# Patient Record
Sex: Male | Born: 1960 | Race: Black or African American | Hispanic: No | Marital: Married | Smoking: Former smoker
Health system: Southern US, Community
[De-identification: ages and names within clinical notes are randomized; demographics above are authoritative.]

## PROBLEM LIST (undated history)

## (undated) DIAGNOSIS — T7840XA Allergy, unspecified, initial encounter: Secondary | ICD-10-CM

## (undated) DIAGNOSIS — F1910 Other psychoactive substance abuse, uncomplicated: Secondary | ICD-10-CM

## (undated) DIAGNOSIS — M1990 Unspecified osteoarthritis, unspecified site: Secondary | ICD-10-CM

## (undated) DIAGNOSIS — D689 Coagulation defect, unspecified: Secondary | ICD-10-CM

## (undated) DIAGNOSIS — I10 Essential (primary) hypertension: Secondary | ICD-10-CM

## (undated) DIAGNOSIS — G709 Myoneural disorder, unspecified: Secondary | ICD-10-CM

## (undated) DIAGNOSIS — I639 Cerebral infarction, unspecified: Secondary | ICD-10-CM

## (undated) DIAGNOSIS — E785 Hyperlipidemia, unspecified: Secondary | ICD-10-CM

## (undated) HISTORY — DX: Other psychoactive substance abuse, uncomplicated: F19.10

## (undated) HISTORY — DX: Essential (primary) hypertension: I10

## (undated) HISTORY — DX: Coagulation defect, unspecified: D68.9

## (undated) HISTORY — DX: Unspecified osteoarthritis, unspecified site: M19.90

## (undated) HISTORY — DX: Myoneural disorder, unspecified: G70.9

## (undated) HISTORY — DX: Allergy, unspecified, initial encounter: T78.40XA

## (undated) HISTORY — DX: Hyperlipidemia, unspecified: E78.5

---

## 2002-03-26 ENCOUNTER — Emergency Department (HOSPITAL_COMMUNITY): Admission: EM | Admit: 2002-03-26 | Discharge: 2002-03-26 | Payer: Self-pay | Admitting: *Deleted

## 2002-03-26 ENCOUNTER — Encounter: Payer: Self-pay | Admitting: *Deleted

## 2002-07-26 ENCOUNTER — Emergency Department (HOSPITAL_COMMUNITY): Admission: EM | Admit: 2002-07-26 | Discharge: 2002-07-26 | Payer: Self-pay | Admitting: *Deleted

## 2002-07-26 ENCOUNTER — Encounter: Payer: Self-pay | Admitting: *Deleted

## 2006-01-31 ENCOUNTER — Emergency Department (HOSPITAL_COMMUNITY): Admission: EM | Admit: 2006-01-31 | Discharge: 2006-02-01 | Payer: Self-pay | Admitting: Emergency Medicine

## 2008-10-31 ENCOUNTER — Inpatient Hospital Stay (HOSPITAL_COMMUNITY): Admission: EM | Admit: 2008-10-31 | Discharge: 2008-11-05 | Payer: Self-pay | Admitting: Emergency Medicine

## 2008-11-02 ENCOUNTER — Ambulatory Visit: Payer: Self-pay | Admitting: Vascular Surgery

## 2008-11-02 ENCOUNTER — Encounter (INDEPENDENT_AMBULATORY_CARE_PROVIDER_SITE_OTHER): Payer: Self-pay | Admitting: *Deleted

## 2009-04-08 ENCOUNTER — Emergency Department (HOSPITAL_COMMUNITY): Admission: EM | Admit: 2009-04-08 | Discharge: 2009-04-08 | Payer: Self-pay | Admitting: Family Medicine

## 2010-05-04 ENCOUNTER — Emergency Department (HOSPITAL_COMMUNITY): Admission: EM | Admit: 2010-05-04 | Discharge: 2010-05-04 | Payer: BC Managed Care – PPO | Admitting: Emergency Medicine

## 2010-06-15 ENCOUNTER — Emergency Department (HOSPITAL_COMMUNITY)
Admission: EM | Admit: 2010-06-15 | Discharge: 2010-06-15 | Payer: BC Managed Care – PPO | Source: Home / Self Care | Admitting: Emergency Medicine

## 2010-06-17 ENCOUNTER — Emergency Department (HOSPITAL_COMMUNITY)
Admission: EM | Admit: 2010-06-17 | Discharge: 2010-06-17 | Payer: BC Managed Care – PPO | Source: Home / Self Care | Admitting: Family Medicine

## 2010-10-11 LAB — HEPATIC FUNCTION PANEL
Alkaline Phosphatase: 51 U/L (ref 39–117)
Bilirubin, Direct: 0.1 mg/dL (ref 0.0–0.3)
Indirect Bilirubin: 0.9 mg/dL (ref 0.3–0.9)
Total Bilirubin: 1 mg/dL (ref 0.3–1.2)

## 2010-10-11 LAB — COMPREHENSIVE METABOLIC PANEL
ALT: 16 U/L (ref 0–53)
AST: 31 U/L (ref 0–37)
Albumin: 3.4 g/dL — ABNORMAL LOW (ref 3.5–5.2)
Alkaline Phosphatase: 35 U/L — ABNORMAL LOW (ref 39–117)
Alkaline Phosphatase: 42 U/L (ref 39–117)
BUN: 8 mg/dL (ref 6–23)
CO2: 26 mEq/L (ref 19–32)
Calcium: 8.3 mg/dL — ABNORMAL LOW (ref 8.4–10.5)
Chloride: 106 mEq/L (ref 96–112)
Creatinine, Ser: 1.05 mg/dL (ref 0.4–1.5)
GFR calc Af Amer: >60 mL/min (ref >60)
GFR calc non Af Amer: >60 mL/min (ref >60)
Glucose, Bld: 99 mg/dL (ref 70–99)
Potassium: 4.1 mEq/L (ref 3.5–5.1)
Sodium: 140 mEq/L (ref 135–145)
Total Bilirubin: 1 mg/dL (ref 0.3–1.2)
Total Protein: 5.3 g/dL — ABNORMAL LOW (ref 6.0–8.3)

## 2010-10-11 LAB — DIFFERENTIAL
Basophils Absolute: 0 10*3/uL (ref 0.0–0.1)
Eosinophils Relative: 1 % (ref 0–5)
Lymphocytes Relative: 34 % (ref 12–46)
Lymphs Abs: 2.3 10*3/uL (ref 0.7–4.0)
Monocytes Absolute: 0.6 10*3/uL (ref 0.1–1.0)
Monocytes Relative: 8 % (ref 3–12)
Neutro Abs: 3.7 10*3/uL (ref 1.7–7.7)

## 2010-10-11 LAB — BASIC METABOLIC PANEL
BUN: 7 mg/dL (ref 6–23)
CO2: 24 mEq/L (ref 19–32)
CO2: 28 mEq/L (ref 19–32)
Calcium: 9 mg/dL (ref 8.4–10.5)
Calcium: 9.2 mg/dL (ref 8.4–10.5)
Chloride: 114 mEq/L — ABNORMAL HIGH (ref 96–112)
Creatinine, Ser: 1.01 mg/dL (ref 0.4–1.5)
Creatinine, Ser: 1.08 mg/dL (ref 0.4–1.5)
GFR calc Af Amer: >60 mL/min (ref >60)
GFR calc Af Amer: >60 mL/min (ref >60)
GFR calc non Af Amer: >60 mL/min (ref >60)
GFR calc non Af Amer: >60 mL/min (ref >60)
Glucose, Bld: 109 mg/dL — ABNORMAL HIGH (ref 70–99)
Glucose, Bld: 114 mg/dL — ABNORMAL HIGH (ref 70–99)
Glucose, Bld: 87 mg/dL (ref 70–99)
Potassium: 3.9 mEq/L (ref 3.5–5.1)
Sodium: 136 mEq/L (ref 135–145)
Sodium: 141 mEq/L (ref 135–145)

## 2010-10-11 LAB — URINALYSIS, ROUTINE W REFLEX MICROSCOPIC
Ketones, ur: NEGATIVE mg/dL
Protein, ur: NEGATIVE mg/dL
Urobilinogen, UA: 1 mg/dL (ref 0.0–1.0)

## 2010-10-11 LAB — POCT CARDIAC MARKERS
CKMB, poc: <1 ng/mL — ABNORMAL LOW (ref 1.0–8.0)
CKMB, poc: <1 ng/mL — ABNORMAL LOW (ref 1.0–8.0)
Myoglobin, poc: 53.3 ng/mL (ref 12–200)
Troponin i, poc: <0.05 ng/mL (ref 0.00–0.09)
Troponin i, poc: <0.05 ng/mL (ref 0.00–0.09)

## 2010-10-11 LAB — CBC
HCT: 45.8 % (ref 39.0–52.0)
HCT: 46.6 % (ref 39.0–52.0)
Hemoglobin: 14 g/dL (ref 13.0–17.0)
Hemoglobin: 14.1 g/dL (ref 13.0–17.0)
Hemoglobin: 16.4 g/dL (ref 13.0–17.0)
MCHC: 34.5 g/dL (ref 30.0–36.0)
MCHC: 34.8 g/dL (ref 30.0–36.0)
MCHC: 35 g/dL (ref 30.0–36.0)
MCV: 96.3 fL (ref 78.0–100.0)
MCV: 97.3 fL (ref 78.0–100.0)
Platelets: 164 10*3/uL (ref 150–400)
Platelets: 171 10*3/uL (ref 150–400)
RBC: 4.16 MIL/uL — ABNORMAL LOW (ref 4.22–5.81)
RBC: 4.75 MIL/uL (ref 4.22–5.81)
RBC: 4.88 MIL/uL (ref 4.22–5.81)
RDW: 12.7 % (ref 11.5–15.5)
RDW: 12.8 % (ref 11.5–15.5)
RDW: 13.2 % (ref 11.5–15.5)
RDW: 13.3 % (ref 11.5–15.5)
WBC: 6.2 10*3/uL (ref 4.0–10.5)

## 2010-10-11 LAB — LIPID PANEL
LDL Cholesterol: 116 mg/dL — ABNORMAL HIGH (ref 0–99)
Total CHOL/HDL Ratio: 6.5 RATIO
VLDL: 26 mg/dL (ref 0–40)

## 2010-10-11 LAB — RAPID URINE DRUG SCREEN, HOSP PERFORMED
Amphetamines: NOT DETECTED
Barbiturates: NOT DETECTED

## 2010-10-11 LAB — PROTIME-INR
INR: 1.1 (ref 0.00–1.49)
Prothrombin Time: 14.5 seconds (ref 11.6–15.2)

## 2010-10-11 LAB — TSH: TSH: 1.095 u[IU]/mL (ref 0.350–4.500)

## 2010-10-11 LAB — RPR: RPR Ser Ql: NONREACTIVE

## 2010-11-15 NOTE — Procedures (Signed)
EEG NUMBER:  [id number]   CLINICAL HISTORY:  A 50 year old admitted with syncope, who passed out  at a friend's house, losing consciousness for 1-2 minutes and awakened  without confusion, somewhat weak and headache with left-sided weakness.  (780.2)   PROCEDURE:  The tracing was carried out on a 32-channel digital Cadwell  recorder, reformatted into 16-channel montages with one devoted to EKG.  The patient was awake and alert during the recording and drowsy.  The  International 10/20 system lead placement was used.  Medications include  Lovenox, aspirin, NicoDerm, Zofran, and Tylenol.   DESCRIPTION OF FINDINGS:  Dominant frequency is a 15-20 microvolt, 10 Hz  alpha range activity.  Drowsiness is associated with mixed frequency  theta and upper delta range activity.  The patient did not drift in  natural sleep.  There was no focal slowing.  There was no interictal or  epileptiform activity in the form of spikes or sharp waves.  Hyperventilation failed to cause change in background.  Photic  stimulation induced a driving response from 1-61 Hz.   EKG showed a sinus bradycardia with ventricular response of 45 beats per  minute.   IMPRESSION:  In the waking state and drowsiness, this record is normal.      Deanna Artis. Sharene Skeans, M.D.  Electronically Signed     WRU:EAVW  D:  11/02/2008 22:58:49  T:  11/03/2008 09:81:19  Job #:  [id number]   cc:   Dr. Vena Austria

## 2010-11-15 NOTE — Discharge Summary (Signed)
NAME:  Samuel Shaw, Samuel Shaw NO.:  [id number]   MEDICAL RECORD NO.:  [id number]           PATIENT TYPE:   LOCATION:                                 FACILITY:   PHYSICIAN:  Elliot Cousin, M.D.    DATE OF BIRTH:  [birth year]   DATE OF ADMISSION:  DATE OF DISCHARGE:                               DISCHARGE SUMMARY   DISCHARGE DIAGNOSES:  1. Syncope, etiology unclear.  2. Sinus bradycardia.  Apparently, the patient's bradycardia is      lifelong by his history.  3. Nonobstructive bilateral internal carotid artery stenosis.  4. Tobacco abuse.  5. Marijuana use.   DISCHARGE MEDICATIONS:  1. Aspirin 81 mg daily.  2. Nicotine patch 21 mg daily, then as directed on the label.   DISCHARGE DISPOSITION:  The patient is being discharged to home in  improved and stable condition.  He was advised to follow up with his  primary care physician, Dr. Syliva Overman, in approximately 2 weeks  and with Dr. Corliss Skains in 1 year as advised.   CONSULTATION:  Sanjeev K. Deveshwar, MD   PROCEDURES PERFORMED:  1. Cerebral angiogram on Nov 03, 2008.  The results revealed mild to      moderate left internal carotid artery supraclinoid stenosis and      mild right internal carotid artery supraclinoid stenosis.  2. Carotid Doppler study on September 02, 2008.  The results revealed no      internal carotid artery stenosis.  3. EEG on Nov 02, 2008.  The results revealed in the waking state and      drowsiness, this record was normal.  4. A 2-D echocardiogram on Nov 02, 2008.  The results revealed left      ventricular size normal.  Wall thickness was normal.  Ejection      fraction estimated to be 50-55%.  Wall motion was normal.  There      was redundancy of the atrial septum.   HISTORY OF PRESENT ILLNESS:  The patient is a 50 year old man with no  significant past medical history, who presented to the emergency  department on Oct 31, 2008, after experiencing a syncopal episode.  According to the  history, the patient may have been unconscious for  approximately 1 minute.  When he came to, he was apparently weak but  somewhat oriented.  He admitted to occasional marijuana use but no other  illicit drug use.  When the patient was evaluated in the emergency  department, he was noted to be mildly bradycardic, otherwise,  hemodynamically stable.  He was afebrile.  A CT scan of the head was  ordered in the emergency department, and it revealed no acute  abnormalities.  His chest x-ray also revealed no acute abnormalities.  He was admitted for further evaluation and management.   For additional details, please see the dictated history and physical.   HOSPITAL COURSE:  SYNCOPE:  The patient was started on IV fluids  empirically.  For further evaluation, a number of studies were ordered  including an RPR, urine drug screen, D-dimer, TSH, and urinalysis.  Also  orthostatic vital signs were ordered.  The patient was not orthostatic  by blood pressure and heart rate.  His urinalysis was essentially  negative.  His urine drug screen was positive for THC, which was  consistent with his history.  His D-dimer was less than 0.22.  His TSH  was within normal limits at 1.095.  His RPR was nonreactive.  Cardiac  markers were also ordered and they were negative.  For ongoing  evaluation, an MRI of the brain and MRA of the brain were ordered, as  well as carotid Dopplers, 2-D echocardiogram, and an EEG.  The MRI of  his brain was essentially negative; however, there was some evidence of  mild maxillary sinusitis.  The MRA of his head revealed moderate  narrowing of both supraclinoid internal carotid arteries with dilatation  distal to both.  His carotid Doppler study, however was negative for ICA  stenosis.  His 2-D echocardiogram revealed preserved LV function and  with no gross valvular abnormalities.  His EEG was essentially negative.  Given the findings of the MRA of the head, interventional  radiologist,  Dr. Corliss Skains, was consulted.  Dr. Corliss Skains evaluated the patient and  proceeded with a cerebral angiogram.  The results are dictated above.  The patient was started on aspirin 81 mg daily.  Because of his tobacco  abuse, he was strongly advised to stop smoking.  A nicotine patch was  placed.   Additional lab studies were ordered including a fasting lipid profile  and a random cortisol level.  His fasting lipid profile revealed a total  cholesterol of 168, triglycerides of 129, HDL cholesterol of 26, and LDL  cholesterol of 116.  The random blood cortisol was within normal limits  at 5.7.  Over the past few days, the patient has been completely  asymptomatic.  He does have a bradycardia, which he explains has been  lifelong.  With ambulation, his heart rate increases to the lower 60s to  upper 80s; however, at rest and particularly during sleep, his heart  rate falls into the mid to upper 40s.  His bradycardia was felt to not  be a contributing factor to syncope, although it could not be completely  ruled out.  The exact etiology of his syncope was unclear.  The patient  did report that he had been working in the sun earlier that day and may  not have had enough to drink.  Therefore, the patient was advised to  avoid dehydration and marijuana use.  He was advised to follow up with  Dr. Corliss Skains per Dr. Fatima Sanger recommendation in 1 year.  He was also  strongly advised to follow up with his primary care physician, Dr.  Syliva Overman, in 2 weeks or earlier if needed.      Elliot Cousin, M.D.  Electronically Signed     DF/MEDQ  D:  11/05/2008  T:  11/06/2008  Job:  [id number]   cc:   Milus Mallick. Lodema Hong, M.D.  Sanjeev K. Corliss Skains, M.D.

## 2010-11-15 NOTE — H&P (Signed)
NAME:  Samuel Shaw, Samuel Shaw NO.:  [id number]   MEDICAL RECORD NO.:  [id number]          PATIENT TYPE:  EMS   LOCATION:  [facility]                         FACILITY:  [facility]   PHYSICIAN:  Vania Rea, M.D. DATE OF BIRTH:  14-Apr-1961   DATE OF ADMISSION:  10/31/2008  DATE OF DISCHARGE:                              HISTORY & PHYSICAL   PRIMARY CARE PHYSICIAN:  Unassigned.   CHIEF COMPLAINT:  Fainting spell.   HISTORY OF PRESENT ILLNESS:  This is a 50 year old African American  gentleman with no significant past medical history who while visiting a  friend today, got up from the couch to leave and passed out at the  friend's home.  By second-hand reports, the patient was unconscious for  maybe a minute or two and was very weak on being assisted up from the  ground.  However, he did not have any post-syncopal confusion.  The  patient was brought to the emergency room and his wife is now in  attendance.  She reports that he continues to be very weak, but he is  well oriented.   Wife also reports that the patient has been having episodic headaches  for about a week and has also been having weakness of the left side.  She has also been pointing out to him that the left side of his face is  slightly weak compared to the right side for some days now.  However,  she says the patient has been denying this.   The patient admits to occasional marijuana use and last used marijuana  about 1 month ago he says.  He denies alcohol use.  He does smoke 1 pack  of cigarettes per day.  He denies any chest pain, shortness of breath,  palpitations, nausea, vomiting or diarrhea.  He denies fever, chills.  Wife does report that he has not been getting a lot of sleep and has  been working very long hours for the past 7 days.   PAST MEDICAL HISTORY:  None.   MEDICATIONS:  None.   ALLERGIES:  NO KNOWN DRUG ALLERGIES.   SOCIAL HISTORY:  Lives with his wife and children.  Smokes 1 pack per  day.  Occasional alcohol use.  Marijuana use occasional, last used 1  month ago.  He works in [employer].   FAMILY HISTORY:  Denies any medical problems in his parents or siblings.  No history of cardiovascular disease.   PHYSICAL EXAMINATION:  GENERAL:  Somewhat lethargic middle-aged African  American gentleman reclining in the stretcher.  Needs a lot of  encouragement and reinforcement to open his eyes and cooperate with the  physical examination.  VITAL SIGNS:  Temperature 97.7, pulse 51, respirations 14, blood  pressure 117/71 lying, 125/79 standing with a pulse of 78.  There is no  dizziness with standing.  He is saturating 100% on room air.  HEENT:  His pupils are equal, round, and reactive.  Mucous membranes  pink.  He does appear to be mildly dehydrated.  NECK:  No cervical lymphadenopathy or thyromegaly.  No jugular venous  distention.  CHEST:  Clear to auscultation bilaterally.  CARDIOVASCULAR:  Regular rhythm.  No murmur heard.  He is appropriate  with a bradycardia for his age and profession.  ABDOMEN:  Scaphoid, soft, nontender.  There are no masses.  EXTREMITIES:  Without edema.  He has no bony joint deformity.  SKIN:  Without ulcer or blemish.  CENTRAL NERVOUS SYSTEM:  He does have mild left facial weakness which  although it is very difficult to see, but almost disappears when he is  forced to smile.  His wife insists that is not the normal contour of his  face.  The patient is right-handed, but he does have a mild right-sided  weakness when challenged against resistance.  His reflexes are equal and  brisk throughout.  He has no clonus.  There is no sensory deficit.  He  has no impairment of gag reflex or swallowing.   LABORATORY DATA:  CBC is reviewed and is completely normal.  White count  6.7, hemoglobin 16.4, platelets 189.  Serum chemistry is likewise  reviewed and is completely normal with potassium 3.9, BUN 12, creatinine  1.2.  Urinalysis:  Specific gravity  is 1.022, it is otherwise completely  normal.  His cardiac enzymes are completely normal with undetectable  troponins.  A repeat is also completely normal.  His urine drug screen  is positive for cannabis.  Negative for other constituents.   DIAGNOSIS:  1. A two-view chest x-ray shows no acute cardiopulmonary disease.  2. CT scan of his brain without contrast shows no evidence of brain or      skull.   ASSESSMENT:  1. Acute syncopal episode.  2. Tobacco abuse.  3. Marijuana abuse.  4. Orthostatic vitals.  5. Mild left facial weakness.   PLAN:  We will bring this gentleman on a short observation for  hydration.  We will get MRI of his brain to rule out an acute stroke or  other pathology.  Other plans as per orders.      Vania Rea, M.D.  Electronically Signed     LC/MEDQ  D:  10/31/2008  T:  10/31/2008  Job:  [id number]   cc:   Samuel Jester, DO

## 2013-01-13 ENCOUNTER — Encounter (HOSPITAL_COMMUNITY): Payer: Self-pay | Admitting: *Deleted

## 2013-01-13 ENCOUNTER — Emergency Department (HOSPITAL_COMMUNITY)
Admission: EM | Admit: 2013-01-13 | Discharge: 2013-01-13 | Disposition: A | Discharge status: Home / Self Care | Payer: BC Managed Care – PPO | Attending: Emergency Medicine | Admitting: Emergency Medicine

## 2013-01-13 DIAGNOSIS — K029 Dental caries, unspecified: Secondary | ICD-10-CM | POA: Insufficient documentation

## 2013-01-13 DIAGNOSIS — F17200 Nicotine dependence, unspecified, uncomplicated: Secondary | ICD-10-CM | POA: Insufficient documentation

## 2013-01-13 DIAGNOSIS — K0889 Other specified disorders of teeth and supporting structures: Secondary | ICD-10-CM

## 2013-01-13 DIAGNOSIS — K089 Disorder of teeth and supporting structures, unspecified: Secondary | ICD-10-CM | POA: Insufficient documentation

## 2013-01-13 MED ORDER — PENICILLIN V POTASSIUM 500 MG PO TABS: 500.0000 mg | ORAL_TABLET | Freq: Four times a day (QID) | ORAL | Status: DC

## 2013-01-13 MED ORDER — IBUPROFEN 800 MG PO TABS: 800.0000 mg | ORAL_TABLET | Freq: Three times a day (TID) | ORAL | Status: DC

## 2013-01-13 MED ORDER — HYDROCODONE-ACETAMINOPHEN 5-325 MG PO TABS
1.0000 | ORAL_TABLET | Freq: Once | ORAL | Status: AC
  Administered 2013-01-13: 1 via ORAL
  Filled 2013-01-13: qty 1

## 2013-01-13 NOTE — ED Notes (Signed)
Tooth pain x 2 days

## 2013-01-13 NOTE — ED Provider Notes (Signed)
History    CSN: [id number] Arrival date & time 01/13/13  1306  First MD Initiated Contact with Patient 01/13/13 1354     Chief Complaint  Patient presents with  . Dental Pain   (Consider location/radiation/quality/duration/timing/severity/associated sxs/prior Treatment) Patient is a 52 y.o. male presenting with tooth pain. The history is provided by the patient. No language interpreter was used.  Dental Pain Location:  Lower Lower teeth location:  29/RL 2nd bicuspid Quality:  Throbbing Severity:  Mild Onset quality:  Gradual Duration:  2 days Timing:  Constant Progression:  Unchanged Chronicity:  New Context: dental caries and poor dentition   Context: not abscess and not trauma   Relieved by:  None tried Worsened by:  Touching Ineffective treatments:  None tried Associated symptoms: no congestion, no difficulty swallowing, no drooling, no facial pain, no facial swelling, no fever, no gum swelling, no neck pain, no neck swelling, no oral bleeding, no oral lesions and no trismus   Risk factors: lack of dental care and smoking    History reviewed. No pertinent past medical history. History reviewed. No pertinent past surgical history. No family history on file. History  Substance Use Topics  . Smoking status: Current Every Day Smoker  . Smokeless tobacco: Not on file  . Alcohol Use: No    Review of Systems  Constitutional: Negative for fever.  HENT: Positive for dental problem (pain). Negative for congestion, facial swelling, rhinorrhea, drooling, mouth sores, trouble swallowing and neck pain.   Respiratory: Negative for shortness of breath.   All other systems reviewed and are negative.   Allergies  Review of patient's allergies indicates no known allergies.  Home Medications   Current Outpatient Rx  Name  Route  Sig  Dispense  Refill  . ibuprofen (ADVIL,MOTRIN) 800 MG tablet   Oral   Take 1 tablet (800 mg total) by mouth 3 (three) times daily.   21 tablet   0   . penicillin v potassium (VEETID) 500 MG tablet   Oral   Take 1 tablet (500 mg total) by mouth 4 (four) times daily.   40 tablet   0    BP 139/90  Pulse 50  Temp(Src) 97.9 F (36.6 C) (Oral)  Resp 20  SpO2 99% Physical Exam  Nursing note and vitals reviewed. Constitutional: He is oriented to person, place, and time. He appears well-developed and well-nourished. No distress.  HENT:  Head: Normocephalic and atraumatic. No trismus in the jaw.  Right Ear: Tympanic membrane, external ear and ear canal normal. No mastoid tenderness.  Left Ear: Tympanic membrane, external ear and ear canal normal. No mastoid tenderness.  Nose: Nose normal.  Mouth/Throat: Uvula is midline, oropharynx is clear and moist and mucous membranes are normal. Abnormal dentition. Dental caries present. No dental abscesses or edematous.    Airway patent. No area of fluctuance to suspect dental abscess. Patient tolerating secretions without difficulty.  Eyes: Conjunctivae and EOM are normal. Pupils are equal, round, and reactive to light. No scleral icterus.  Neck: Normal range of motion. Neck supple.  Cardiovascular: Normal rate, regular rhythm and normal heart sounds.   Pulmonary/Chest: Effort normal. No stridor. No respiratory distress. He has no wheezes.  Musculoskeletal: Normal range of motion.  Lymphadenopathy:    He has no cervical adenopathy.  Neurological: He is alert and oriented to person, place, and time.  Skin: Skin is warm and dry. No rash noted. He is not diaphoretic. No erythema. No pallor.  Psychiatric: He has a  normal mood and affect. His behavior is normal.   ED Course  Procedures (including critical care time) Labs Reviewed - No data to display No results found.  1. Pain, dental    MDM  Patient is a 52 year old male who presents for dental pain with onset yesterday. Physical exam as above. There is no trismus or stridor. Airway patent and patient tolerating secretions without  difficulty. No area of fluctuance to suspect drainable dental abscess. No red flags concerning for Ludwig's Angina. Patient appropriate for discharge with dental followup. Prescription for penicillin given to cover for infection and ibuprofen given for pain. Indications for ED return discussed with the patient verbalizes comfort and understanding with this discharge plan.  Antony Madura, PA-C 01/13/13 1426  Medical screening examination/treatment/procedure(s) were performed by non-physician practitioner and as supervising physician I was immediately available for consultation/collaboration.   Ashby Dawes, MD 01/14/13 1302

## 2013-01-13 NOTE — ED Notes (Signed)
Pt is her with right lower tooth pain

## 2013-07-03 DIAGNOSIS — I639 Cerebral infarction, unspecified: Secondary | ICD-10-CM

## 2013-07-03 HISTORY — DX: Cerebral infarction, unspecified: I63.9

## 2013-11-17 ENCOUNTER — Emergency Department (HOSPITAL_COMMUNITY)
Admission: EM | Admit: 2013-11-17 | Discharge: 2013-11-17 | Disposition: A | Discharge status: Home / Self Care | Payer: BC Managed Care – PPO | Attending: Emergency Medicine | Admitting: Emergency Medicine

## 2013-11-17 ENCOUNTER — Encounter (HOSPITAL_COMMUNITY): Payer: Self-pay | Admitting: Emergency Medicine

## 2013-11-17 DIAGNOSIS — Z791 Long term (current) use of non-steroidal anti-inflammatories (NSAID): Secondary | ICD-10-CM | POA: Insufficient documentation

## 2013-11-17 DIAGNOSIS — F17200 Nicotine dependence, unspecified, uncomplicated: Secondary | ICD-10-CM | POA: Insufficient documentation

## 2013-11-17 DIAGNOSIS — Z792 Long term (current) use of antibiotics: Secondary | ICD-10-CM | POA: Insufficient documentation

## 2013-11-17 DIAGNOSIS — R300 Dysuria: Secondary | ICD-10-CM | POA: Insufficient documentation

## 2013-11-17 DIAGNOSIS — B86 Scabies: Secondary | ICD-10-CM | POA: Insufficient documentation

## 2013-11-17 LAB — URINALYSIS, ROUTINE W REFLEX MICROSCOPIC
Bilirubin Urine: NEGATIVE
GLUCOSE, UA: NEGATIVE mg/dL
HGB URINE DIPSTICK: NEGATIVE
KETONES UR: NEGATIVE mg/dL
LEUKOCYTES UA: NEGATIVE
Nitrite: NEGATIVE
PH: 5 (ref 5.0–8.0)
Protein, ur: NEGATIVE mg/dL
Specific Gravity, Urine: 1.023 (ref 1.005–1.030)
Urobilinogen, UA: 0.2 mg/dL (ref 0.0–1.0)

## 2013-11-17 MED ORDER — PERMETHRIN 5 % EX CREA: TOPICAL_CREAM | CUTANEOUS | Status: DC

## 2013-11-17 NOTE — ED Provider Notes (Signed)
CSN: [id number]     Arrival date & time 11/17/13  1604 History   First MD Initiated Contact with Patient 11/17/13 1651     Chief Complaint  Patient presents with  . Illegal value: [  . scabies      (Consider location/radiation/quality/duration/timing/severity/associated sxs/prior Treatment) Patient is a 53 y.o. male presenting with rash. The history is provided by the patient. No language interpreter was used.  Rash Location:  Full body Quality: itchiness   Severity:  Moderate Onset quality:  Unable to specify Duration:  1 week Timing:  Constant Progression:  Worsening Chronicity:  New Context: insect bite/sting (suspected)   Relieved by:  Nothing Worsened by:  Nothing tried Ineffective treatments:  None tried Associated symptoms: no abdominal pain, no diarrhea, no fatigue, no fever, no headaches, no nausea, no shortness of breath and not vomiting     History reviewed. No pertinent past medical history. History reviewed. No pertinent past surgical history. History reviewed. No pertinent family history. History  Substance Use Topics  . Smoking status: Current Every Day Smoker  . Smokeless tobacco: Not on file  . Alcohol Use: No    Review of Systems  Constitutional: Negative for fever, activity change, appetite change and fatigue.  HENT: Negative for congestion, facial swelling, rhinorrhea and trouble swallowing.   Eyes: Negative for photophobia and pain.  Respiratory: Negative for cough, chest tightness and shortness of breath.   Cardiovascular: Negative for chest pain and leg swelling.  Gastrointestinal: Negative for nausea, vomiting, abdominal pain, diarrhea and constipation.  Endocrine: Negative for polydipsia and polyuria.  Genitourinary: Positive for dysuria. Negative for urgency, decreased urine volume and difficulty urinating.  Musculoskeletal: Negative for back pain and gait problem.  Skin: Positive for rash. Negative for color change and wound.   Allergic/Immunologic: Negative for immunocompromised state.  Neurological: Negative for dizziness, facial asymmetry, speech difficulty, weakness, numbness and headaches.  Psychiatric/Behavioral: Negative for confusion, decreased concentration and agitation.      Allergies  Review of patient's allergies indicates no known allergies.  Home Medications   Prior to Admission medications   Medication Sig Start Date End Date Taking? Authorizing Provider  ibuprofen (ADVIL,MOTRIN) 800 MG tablet Take 1 tablet (800 mg total) by mouth 3 (three) times daily. 01/13/13   Antonietta Breach, PA-C  penicillin v potassium (VEETID) 500 MG tablet Take 1 tablet (500 mg total) by mouth 4 (four) times daily. 01/13/13   Antonietta Breach, PA-C   BP 130/93  Pulse 68  Temp(Src) 98.5 F (36.9 C) (Oral)  Resp 16  SpO2 97% Physical Exam  Constitutional: He is oriented to person, place, and time. He appears well-developed and well-nourished. No distress.  HENT:  Head: Normocephalic and atraumatic.  Mouth/Throat: No oropharyngeal exudate.  Eyes: Pupils are equal, round, and reactive to light.  Neck: Normal range of motion. Neck supple.  Cardiovascular: Normal rate, regular rhythm and normal heart sounds.  Exam reveals no gallop and no friction rub.   No murmur heard. Pulmonary/Chest: Effort normal and breath sounds normal. No respiratory distress. He has no wheezes. He has no rales.  Abdominal: Soft. Bowel sounds are normal. He exhibits no distension and no mass. There is no tenderness. There is no rebound and no guarding.  Musculoskeletal: Normal range of motion. He exhibits no edema and no tenderness.  Neurological: He is alert and oriented to person, place, and time.  Skin: Skin is warm and dry.  Scattered excoriated papules on extremities, scantly on trunk, and also inguinal folds. Area  of former tick bite on penis nml.   Psychiatric: He has a normal mood and affect.    ED Course  Procedures (including critical  care time) Labs Review Labs Reviewed  GC/CHLAMYDIA PROBE AMP  URINALYSIS, ROUTINE W REFLEX MICROSCOPIC    Imaging Review No results found.   EKG Interpretation None      MDM   Final diagnoses:  Scabies    Pt is a 53 y.o. male with Pmhx as above who presents with about 1 week of a generalized pruritic rash, worse in groin. Wife with similar rash for about 1 week as well. Suspect scabies. Will treat w/ elimite cream. Pt also complains of burning urination. Will check UA, urine GC/Chlam.   UA nml. Will use condoms until GC/Ch returns.         Neta Ehlers, MD 11/17/13 [phone]

## 2013-11-17 NOTE — ED Notes (Signed)
Pt reports itching on lower legs and privates areas. Wife has been treated for scabies. Denies pain, just itching.

## 2013-11-17 NOTE — Discharge Instructions (Signed)
Scabies  Scabies are small bugs (mites) that burrow under the skin and cause red bumps and severe itching. These bugs can only be seen with a microscope. Scabies are highly contagious. They can spread easily from person to person by direct contact. They are also spread through sharing clothing or linens that have the scabies mites living in them. It is not unusual for an entire family to become infected through shared towels, clothing, or bedding.   HOME CARE INSTRUCTIONS   · Your caregiver may prescribe a cream or lotion to kill the mites. If cream is prescribed, massage the cream into the entire body from the neck to the bottom of both feet. Also massage the cream into the scalp and face if your child is less than 1 year old. Avoid the eyes and mouth. Do not wash your hands after application.  · Leave the cream on for 8 to 12 hours. Your child should bathe or shower after the 8 to 12 hour application period. Sometimes it is helpful to apply the cream to your child right before bedtime.  · One treatment is usually effective and will eliminate approximately 95% of infestations. For severe cases, your caregiver may decide to repeat the treatment in 1 week. Everyone in your household should be treated with one application of the cream.  · New rashes or burrows should not appear within 24 to 48 hours after successful treatment. However, the itching and rash may last for 2 to 4 weeks after successful treatment. Your caregiver may prescribe a medicine to help with the itching or to help the rash go away more quickly.  · Scabies can live on clothing or linens for up to 3 days. All of your child's recently used clothing, towels, stuffed toys, and bed linens should be washed in hot water and then dried in a dryer for at least 20 minutes on high heat. Items that cannot be washed should be enclosed in a plastic bag for at least 3 days.  · To help relieve itching, bathe your child in a cool bath or apply cool washcloths to the  affected areas.  · Your child may return to school after treatment with the prescribed cream.  SEEK MEDICAL CARE IF:   · The itching persists longer than 4 weeks after treatment.  · The rash spreads or becomes infected. Signs of infection include red blisters or yellow-tan crust.  Document Released: 06/19/2005 Document Revised: 09/11/2011 Document Reviewed: 10/28/2008  [employer]® Patient Information ©2014 [employer].

## 2014-07-06 ENCOUNTER — Inpatient Hospital Stay (HOSPITAL_COMMUNITY)
Admission: EM | Admit: 2014-07-06 | Discharge: 2014-07-08 | DRG: 065 | Payer: BLUE CROSS/BLUE SHIELD | Attending: Internal Medicine | Admitting: Internal Medicine

## 2014-07-06 ENCOUNTER — Emergency Department (HOSPITAL_COMMUNITY): Payer: BLUE CROSS/BLUE SHIELD

## 2014-07-06 ENCOUNTER — Encounter (HOSPITAL_COMMUNITY): Payer: Self-pay | Admitting: *Deleted

## 2014-07-06 ENCOUNTER — Inpatient Hospital Stay (HOSPITAL_COMMUNITY): Payer: BLUE CROSS/BLUE SHIELD

## 2014-07-06 DIAGNOSIS — I6789 Other cerebrovascular disease: Secondary | ICD-10-CM | POA: Diagnosis not present

## 2014-07-06 DIAGNOSIS — I63511 Cerebral infarction due to unspecified occlusion or stenosis of right middle cerebral artery: Principal | ICD-10-CM | POA: Diagnosis present

## 2014-07-06 DIAGNOSIS — Z7982 Long term (current) use of aspirin: Secondary | ICD-10-CM

## 2014-07-06 DIAGNOSIS — R001 Bradycardia, unspecified: Secondary | ICD-10-CM | POA: Diagnosis not present

## 2014-07-06 DIAGNOSIS — R531 Weakness: Secondary | ICD-10-CM | POA: Diagnosis present

## 2014-07-06 DIAGNOSIS — R270 Ataxia, unspecified: Secondary | ICD-10-CM | POA: Diagnosis present

## 2014-07-06 DIAGNOSIS — R222 Localized swelling, mass and lump, trunk: Secondary | ICD-10-CM

## 2014-07-06 DIAGNOSIS — F17210 Nicotine dependence, cigarettes, uncomplicated: Secondary | ICD-10-CM | POA: Diagnosis present

## 2014-07-06 DIAGNOSIS — G8194 Hemiplegia, unspecified affecting left nondominant side: Secondary | ICD-10-CM | POA: Diagnosis present

## 2014-07-06 DIAGNOSIS — R471 Dysarthria and anarthria: Secondary | ICD-10-CM | POA: Diagnosis present

## 2014-07-06 DIAGNOSIS — E785 Hyperlipidemia, unspecified: Secondary | ICD-10-CM | POA: Diagnosis not present

## 2014-07-06 DIAGNOSIS — Z720 Tobacco use: Secondary | ICD-10-CM

## 2014-07-06 DIAGNOSIS — I63231 Cerebral infarction due to unspecified occlusion or stenosis of right carotid arteries: Secondary | ICD-10-CM | POA: Diagnosis not present

## 2014-07-06 DIAGNOSIS — I6521 Occlusion and stenosis of right carotid artery: Secondary | ICD-10-CM | POA: Diagnosis present

## 2014-07-06 DIAGNOSIS — F419 Anxiety disorder, unspecified: Secondary | ICD-10-CM | POA: Diagnosis present

## 2014-07-06 DIAGNOSIS — I639 Cerebral infarction, unspecified: Secondary | ICD-10-CM | POA: Diagnosis present

## 2014-07-06 DIAGNOSIS — R1310 Dysphagia, unspecified: Secondary | ICD-10-CM

## 2014-07-06 HISTORY — DX: Cerebral infarction, unspecified: I63.9

## 2014-07-06 LAB — CBC
HCT: 45.7 % (ref 39.0–52.0)
Hemoglobin: 16.2 g/dL (ref 13.0–17.0)
MCH: 32 pg (ref 26.0–34.0)
MCHC: 35.4 g/dL (ref 30.0–36.0)
MCV: 90.1 fL (ref 78.0–100.0)
PLATELETS: 191 10*3/uL (ref 150–400)
RBC: 5.07 MIL/uL (ref 4.22–5.81)
RDW: 13.1 % (ref 11.5–15.5)
WBC: 5.7 10*3/uL (ref 4.0–10.5)

## 2014-07-06 LAB — COMPREHENSIVE METABOLIC PANEL
ALBUMIN: 4.2 g/dL (ref 3.5–5.2)
ALK PHOS: 53 U/L (ref 39–117)
ALT: 18 U/L (ref 0–53)
AST: 21 U/L (ref 0–37)
Anion gap: 8 (ref 5–15)
BILIRUBIN TOTAL: 2 mg/dL — AB (ref 0.3–1.2)
BUN: 8 mg/dL (ref 6–23)
CHLORIDE: 104 meq/L (ref 96–112)
CO2: 24 mmol/L (ref 19–32)
Calcium: 9.6 mg/dL (ref 8.4–10.5)
Creatinine, Ser: 1.09 mg/dL (ref 0.50–1.35)
GFR calc Af Amer: 88 mL/min — ABNORMAL LOW (ref >90)
GFR calc non Af Amer: 76 mL/min — ABNORMAL LOW (ref >90)
Glucose, Bld: 99 mg/dL (ref 70–99)
POTASSIUM: 4.4 mmol/L (ref 3.5–5.1)
SODIUM: 136 mmol/L (ref 135–145)
Total Protein: 7 g/dL (ref 6.0–8.3)

## 2014-07-06 LAB — DIFFERENTIAL
BASOS PCT: 1 % (ref 0–1)
Basophils Absolute: 0 10*3/uL (ref 0.0–0.1)
EOS ABS: 0.1 10*3/uL (ref 0.0–0.7)
EOS PCT: 1 % (ref 0–5)
LYMPHS ABS: 2.6 10*3/uL (ref 0.7–4.0)
Lymphocytes Relative: 44 % (ref 12–46)
Monocytes Absolute: 0.5 10*3/uL (ref 0.1–1.0)
Monocytes Relative: 9 % (ref 3–12)
NEUTROS PCT: 45 % (ref 43–77)
Neutro Abs: 2.5 10*3/uL (ref 1.7–7.7)

## 2014-07-06 LAB — I-STAT TROPONIN, ED: Troponin i, poc: 0 ng/mL (ref 0.00–0.08)

## 2014-07-06 LAB — PROTIME-INR
INR: 1.13 (ref 0.00–1.49)
PROTHROMBIN TIME: 14.6 s (ref 11.6–15.2)

## 2014-07-06 LAB — APTT: APTT: 29 s (ref 24–37)

## 2014-07-06 MED ORDER — NICOTINE 21 MG/24HR TD PT24
21.0000 mg | MEDICATED_PATCH | Freq: Every day | TRANSDERMAL | Status: DC
  Administered 2014-07-06 – 2014-07-08 (×3): 21 mg via TRANSDERMAL
  Filled 2014-07-06 (×3): qty 1

## 2014-07-06 MED ORDER — STROKE: EARLY STAGES OF RECOVERY BOOK
Freq: Once | Status: DC
  Filled 2014-07-06: qty 1

## 2014-07-06 MED ORDER — ACETAMINOPHEN 650 MG RE SUPP: 325.0000 mg | RECTAL | Status: DC | PRN

## 2014-07-06 MED ORDER — ASPIRIN 300 MG RE SUPP
300.0000 mg | Freq: Every day | RECTAL | Status: DC
  Administered 2014-07-06: 300 mg via RECTAL
  Filled 2014-07-06: qty 1

## 2014-07-06 MED ORDER — HEPARIN SODIUM (PORCINE) 5000 UNIT/ML IJ SOLN
5000.0000 [IU] | Freq: Three times a day (TID) | INTRAMUSCULAR | Status: DC
  Administered 2014-07-06 – 2014-07-08 (×6): 5000 [IU] via SUBCUTANEOUS
  Filled 2014-07-06 (×6): qty 1

## 2014-07-06 NOTE — Consult Note (Signed)
Stroke Consult    Chief Complaint: left sided weakness HPI: Samuel Shaw is an 54 y.o. male PMH of 2 TIAs 2 years ago, left carotid blockage, and known CVA in late December with recent discharge from hospital in [place] presenting with progressive left-sided weakness since that discharge. These symptoms are similar to his stroke in December, the family just feels they are getting worse. His wife also feels his speech is getting progressively harder to understand.  A review of records from  [place] are pertinent for a head CT showing an acute right basal ganglia infarct. Notes also mention a possible R ICA stenosis though no carotid ultrasound report or CTA/MRA of the neck is included. Discharged on ASA 325mg  daily.   Head CT imaging reviewed and no acute process, + signs of a subacute right MCA infarct.   Date last known well: 06/24/3014 Time last known well: 0800 tPA Given: no, outside window, recent CVA  Past Medical History  Diagnosis Date  . Stroke     History reviewed. No pertinent past surgical history.  History reviewed. No pertinent family history. Social History:  reports that he has been smoking.  He does not have any smokeless tobacco history on file. He reports that he does not drink alcohol or use illicit drugs.  Allergies: No Known Allergies   (Not in a hospital admission)  ROS: Out of a complete 14 system review, the patient complains of only the following symptoms, and all other reviewed systems are negative. +weakness, speech difficulty   Physical Examination: Filed Vitals:   07/06/14 1600  BP: 133/81  Pulse: 58  Temp:   Resp: 11   Physical Exam  Constitutional: He appears well-developed and well-nourished.  Psych: Affect appropriate to situation Eyes: No scleral injection HENT: No OP obstrucion Head: Normocephalic.  Cardiovascular: Normal rate and regular rhythm.  Respiratory: Effort normal and breath sounds normal.  GI: Soft. Bowel sounds are  normal. No distension. There is no tenderness.  Skin: WDI   Neurologic Examination: Mental Status: Alert, oriented, thought content appropriate.  Speech fluent without evidence of aphasia. Moderate dysarthria.  Cranial Nerves: II: unable to visualize fundi due to pupil size, visual fields grossly normal, pupils equal, round, reactive to light  III,IV, VI: ptosis not present, extra-ocular motions intact bilaterally V,VII: left facial weakness, facial light touch sensation normal bilaterally VIII: hearing normal bilaterally IX,X: gag reflex present XI: trapezius strength/neck flexion strength normal bilaterally XII: tongue strength normal  Motor: RUE and RLE 5/5 strength LUE and LLE drift with 4+-5-/5 strength Tone and bulk:normal tone throughout; no atrophy noted Sensory: Pinprick and light touch intact throughout, bilaterally Deep Tendon Reflexes: 2+ and symmetric throughout Plantars: Right: downgoing   Left: downgoing Cerebellar: normal finger-to-nose, HTS Gait: deferred due to multiple leads on in ED  Laboratory Studies:   Basic Metabolic Panel:  Recent Labs Lab 07/06/14 1258  NA 136  K 4.4  CL 104  CO2 24  GLUCOSE 99  BUN 8  CREATININE 1.09  CALCIUM 9.6    Liver Function Tests:  Recent Labs Lab 07/06/14 1258  AST 21  ALT 18  ALKPHOS 53  BILITOT 2.0*  PROT 7.0  ALBUMIN 4.2   No results for input(s): LIPASE, AMYLASE in the last 168 hours. No results for input(s): AMMONIA in the last 168 hours.  CBC:  Recent Labs Lab 07/06/14 1258  WBC 5.7  NEUTROABS 2.5  HGB 16.2  HCT 45.7  MCV 90.1  PLT 191  Cardiac Enzymes: No results for input(s): CKTOTAL, CKMB, CKMBINDEX, TROPONINI in the last 168 hours.  BNP: Invalid input(s): POCBNP  CBG: No results for input(s): GLUCAP in the last 168 hours.  Microbiology: No results found for this or any previous visit.  Coagulation Studies:  Recent Labs  07/06/14 1420  LABPROT 14.6  INR 1.13     Urinalysis: No results for input(s): COLORURINE, LABSPEC, PHURINE, GLUCOSEU, HGBUR, BILIRUBINUR, KETONESUR, PROTEINUR, UROBILINOGEN, NITRITE, LEUKOCYTESUR in the last 168 hours.  Invalid input(s): APPERANCEUR  Lipid Panel:     Component Value Date/Time   CHOL  11/05/2008 0440    168        ATP III CLASSIFICATION:  <200     mg/dL   Desirable  200-239  mg/dL   Borderline High  >=240    mg/dL   High          TRIG 129 11/05/2008 0440   HDL 26* 11/05/2008 0440   CHOLHDL 6.5 11/05/2008 0440   VLDL 26 11/05/2008 0440   LDLCALC * 11/05/2008 0440    116        Total Cholesterol/HDL:CHD Risk Coronary Heart Disease Risk Table                     Men   Women  1/2 Average Risk   3.4   3.3  Average Risk       5.0   4.4  2 X Average Risk   9.6   7.1  3 X Average Risk  23.4   11.0        Use the calculated Patient Ratio above and the CHD Risk Table to determine the patient's CHD Risk.        ATP III CLASSIFICATION (LDL):  <100     mg/dL   Optimal  100-129  mg/dL   Near or Above                    Optimal  130-159  mg/dL   Borderline  160-189  mg/dL   High  >190     mg/dL   Very High    HgbA1C: No results found for: HGBA1C  Urine Drug Screen:     Component Value Date/Time   LABOPIA NONE DETECTED 10/31/2008 0009   COCAINSCRNUR NONE DETECTED 10/31/2008 0009   LABBENZ NONE DETECTED 10/31/2008 0009   AMPHETMU NONE DETECTED 10/31/2008 0009   THCU POSITIVE* 10/31/2008 0009   LABBARB  10/31/2008 0009    NONE DETECTED        DRUG SCREEN FOR MEDICAL PURPOSES ONLY.  IF CONFIRMATION IS NEEDED FOR ANY PURPOSE, NOTIFY LAB WITHIN 5 DAYS.        LOWEST DETECTABLE LIMITS FOR URINE DRUG SCREEN Drug Class       Cutoff (ng/mL) Amphetamine      1000 Barbiturate      200 Benzodiazepine   735 Tricyclics       329 Opiates          300 Cocaine          300 THC              50    Alcohol Level: No results for input(s): ETH in the last 168 hours.  Other results: EKG: sinus  bradycardia.  Imaging: Dg Chest 2 View  07/06/2014   CLINICAL DATA:  Increasing left-sided weakness. Previous stroke. Smoker.  EXAM: CHEST  2 VIEW  COMPARISON:  10/31/2008.  FINDINGS: Normal sized  heart. Clear lungs. Increased density overlying the posterior aspect of the lower thoracic spine.  IMPRESSION: Possible mass overlying the posterior aspect of the lower thoracic spine. A chest CT with contrast is recommended.   Electronically Signed   By: Enrique Sack M.D.   On: 07/06/2014 15:54   Ct Head Wo Contrast  07/06/2014   CLINICAL DATA:  Worsened left side weakness. The patient was recently diagnosed with stroke at an outside facility.  EXAM: CT HEAD WITHOUT CONTRAST  TECHNIQUE: Contiguous axial images were obtained from the base of the skull through the vertex without intravenous contrast.  COMPARISON:  Head CT scan and brain MRI 10/31/2008.  FINDINGS: Hypoattenuation in the anterior right MCA territory correlates with recent diagnosis of infarct. Scattered areas of hypoattenuation are also seen in the deep white matter with lacunar infarction which appears remote in the right internal capsule identified. No evidence of acute infarction, hemorrhage, mass lesion, mass effect, midline shift or abnormal extra-axial fluid collection is identified. There is no hydrocephalus or pneumocephalus. The calvarium is intact.  IMPRESSION: No acute finding.  Findings consistent with subacute to remote right MCA territory infarct. Chronic microvascular ischemic change also noted.   Electronically Signed   By: Inge Rise M.D.   On: 07/06/2014 14:57    Assessment: 54 y.o. male with history of prior TIA, left carotid stenosis and recent  CVA presenting with progressive worsening of his left sided symptoms since discharge. Unclear if this represents an extension of recent CVA or recrudescence of prior symptoms. Will admit for further workup.   Stroke Risk Factors -CVA, carotid stenosis, HLD, tobacco use  As records  from outside hospital do not show a 2D echo or carotid doppler would complete these during this admission.  Plan: 1. HgbA1c, fasting lipid panel 2. MRI, MRA  of the brain without contrast 3. PT consult, OT consult, Speech consult 4. Echocardiogram 5. Carotid dopplers 6. Prophylactic therapy-continue ASA 325mg  daily 7. Risk factor modification 8. Telemetry monitoring 9. Frequent neuro checks 10. NPO until RN stroke swallow screen     Jim Like, DO [facility] [phone]  If 7pm- 7am, please page neurology on call as listed in [facility]. 07/06/2014, 4:56 PM

## 2014-07-06 NOTE — Progress Notes (Signed)
Patient went off the floor to Radiology for MRI at 2020 and returned at 2100.

## 2014-07-06 NOTE — ED Notes (Signed)
Per family member, pt was recently treated for cva in [place]. Family states pt having increase in left side weakness and difficulty caring for him at home.

## 2014-07-06 NOTE — ED Provider Notes (Signed)
CSN: [id number]     Arrival date & time 07/06/14  1241 History   First MD Initiated Contact with Patient 07/06/14 1310     Chief Complaint  Patient presents with  . Weakness     (Consider location/radiation/quality/duration/timing/severity/associated sxs/prior Treatment) HPI  Samuel Shaw is a 54 y.o. male with PMH of 2 TIAs 2 years ago without residual effects, known CVA in December with recent discharge from hospital in [place] presenting with progressive left-sided weakness since November 26. Family member in the room and answers most questions. Family member states that he is not mentating at his baseline. He is slower takes 1. She also notes when he is eating sometimes comes out the side of his mouth. He also has difficulty ambulating. She denies any sudden decreases in mentation. No sudden increase in weakness but it is progressive.. Patient has had intermittent headaches that his wife describes as "migraines" he denies any pain now. He denies any blurred vision. His wife denies any slurred speech. He denies any back pain, nausea, vomiting, abdominal pain. After his 2 TIAs he did not have any follow-up and no PCP. He is not taking any medications other than vitamins. He is a current smoker. Denies history of hypertension, diabetes, dyslipidemia.   Past Medical History  Diagnosis Date  . Stroke    History reviewed. No pertinent past surgical history. History reviewed. No pertinent family history. History  Substance Use Topics  . Smoking status: Current Every Day Smoker  . Smokeless tobacco: Not on file  . Alcohol Use: No    Review of Systems  Constitutional: Negative for fever and chills.  HENT: Negative for congestion and rhinorrhea.   Eyes: Negative for visual disturbance.  Respiratory: Negative for cough and shortness of breath.   Cardiovascular: Negative for chest pain and palpitations.  Gastrointestinal: Negative for nausea, vomiting and diarrhea.  Genitourinary:  Negative for dysuria and hematuria.  Musculoskeletal: Negative for back pain and gait problem.  Skin: Negative for rash.  Neurological: Positive for weakness and headaches.      Allergies  Review of patient's allergies indicates no known allergies.  Home Medications   Prior to Admission medications   Medication Sig Start Date End Date Taking? Authorizing Provider  aspirin 325 MG tablet Take 325 mg by mouth daily.   Yes Historical Provider, MD  folic acid (FOLVITE) 614 MCG tablet Take 400 mcg by mouth daily.   Yes Historical Provider, MD  nicotine (NICODERM CQ - DOSED IN MG/24 HOURS) 21 mg/24hr patch Place 21 mg onto the skin daily.   Yes Historical Provider, MD  Thiamine HCl (VITAMIN B-1) 250 MG tablet Take 250 mg by mouth daily.   Yes Historical Provider, MD  permethrin (ELIMITE) 5 % cream Apply from neck down at night after shower. Wash off in the morning. May repeat in 1 week if still symptomatic. Patient not taking: Reported on 07/06/2014 11/17/13   Ernestina Patches, MD   BP 133/81 mmHg  Pulse 58  Temp(Src) 98.1 F (36.7 C) (Oral)  Resp 11  SpO2 99% Physical Exam  Constitutional: He appears well-developed and well-nourished. No distress.  HENT:  Head: Normocephalic and atraumatic.  Mouth/Throat: Oropharynx is clear and moist.  Eyes: Conjunctivae and EOM are normal. Pupils are equal, round, and reactive to light. Right eye exhibits no discharge. Left eye exhibits no discharge.  Neck: Normal range of motion. Neck supple.  No nuchal rigidity  Cardiovascular: Normal rate and regular rhythm.   Pulmonary/Chest: Effort normal and  breath sounds normal. No respiratory distress. He has no wheezes.  Abdominal: Soft. Bowel sounds are normal. He exhibits no distension. There is no tenderness.  Neurological: He is alert. No cranial nerve deficit. Coordination normal.  Speech is clear and goal oriented. Peripheral visual fields intact. Left facial droop. Strength 4/5 in upper and 5/5 lower  extremities. Sensation intact. Pt able to keep arms extended and against resistance but he pronates his left hand. Gait not assessed to to instability.  Skin: Skin is warm and dry. He is not diaphoretic.  Nursing note and vitals reviewed.   ED Course  Procedures (including critical care time) Labs Review Labs Reviewed  COMPREHENSIVE METABOLIC PANEL - Abnormal; Notable for the following:    Total Bilirubin 2.0 (*)    GFR calc non Af Amer 76 (*)    GFR calc Af Amer 88 (*)    All other components within normal limits  CBC  DIFFERENTIAL  APTT  PROTIME-INR  URINALYSIS, ROUTINE W REFLEX MICROSCOPIC  I-STAT TROPOININ, ED    Imaging Review Dg Chest 2 View  07/06/2014   CLINICAL DATA:  Increasing left-sided weakness. Previous stroke. Smoker.  EXAM: CHEST  2 VIEW  COMPARISON:  10/31/2008.  FINDINGS: Normal sized heart. Clear lungs. Increased density overlying the posterior aspect of the lower thoracic spine.  IMPRESSION: Possible mass overlying the posterior aspect of the lower thoracic spine. A chest CT with contrast is recommended.   Electronically Signed   By: Enrique Sack M.D.   On: 07/06/2014 15:54   Ct Head Wo Contrast  07/06/2014   CLINICAL DATA:  Worsened left side weakness. The patient was recently diagnosed with stroke at an outside facility.  EXAM: CT HEAD WITHOUT CONTRAST  TECHNIQUE: Contiguous axial images were obtained from the base of the skull through the vertex without intravenous contrast.  COMPARISON:  Head CT scan and brain MRI 10/31/2008.  FINDINGS: Hypoattenuation in the anterior right MCA territory correlates with recent diagnosis of infarct. Scattered areas of hypoattenuation are also seen in the deep white matter with lacunar infarction which appears remote in the right internal capsule identified. No evidence of acute infarction, hemorrhage, mass lesion, mass effect, midline shift or abnormal extra-axial fluid collection is identified. There is no hydrocephalus or  pneumocephalus. The calvarium is intact.  IMPRESSION: No acute finding.  Findings consistent with subacute to remote right MCA territory infarct. Chronic microvascular ischemic change also noted.   Electronically Signed   By: Inge Rise M.D.   On: 07/06/2014 14:57     EKG Interpretation   Date/Time:  Monday July 06 2014 12:57:15 EST Ventricular Rate:  48 PR Interval:  144 QRS Duration: 84 QT Interval:  420 QTC Calculation: 375 R Axis:   71 Text Interpretation:  Marked sinus bradycardia Minimal voltage criteria  for LVH, may be normal variant Abnormal ECG No significant change since  last tracing Confirmed by Mingo Amber  MD, [place] ([id number]) on 07/06/2014 1:40:32 PM      MDM   Final diagnoses:  Weakness  CVA (cerebral vascular accident)  Dysphagia  Chest mass  Tobacco abuse   Patient presenting after CVA end of December and Hospital in [place] for persistent with worsening left-sided weakness, dysphasia, ataxia. Family member also endorses some decrease in mentation in that he has difficulty focusing and is forgetful. Repeat CT today with findings consistent of subacute right MCA infarct without any findings. Lab work unremarkable and EKG without significant changes. Patient also found to have possible mass on  chest x-ray. He is a chronic smoker. Consult to hospitalist and spoke with Dr. [facility] who agreed to evaluate and for admission. Also consult to neurology who will evaluate him as well.  Discussed all results and patient verbalizes understanding and agrees with plan.  This is a shared patient. This patient was discussed with the physician, Dr. Mingo Amber who saw and evaluated the patient and agrees with the plan.     Pura Spice, PA-C 07/06/14 [place], MD 07/07/14 2330

## 2014-07-06 NOTE — ED Notes (Signed)
Pt to xray

## 2014-07-06 NOTE — H&P (Signed)
Patient Demographics  Samuel Shaw, is a 54 y.o. male  MRN: [id number]   DOB - 1961-04-04  Admit Date - 07/06/2014  Outpatient Primary MD for the patient is Tula Nakayama, MD   With History of -  Past Medical History  Diagnosis Date  . Stroke       History reviewed. No pertinent past surgical history.  in for   Chief Complaint  Patient presents with  . Weakness     HPI  Samuel Shaw  is a 54 y.o. male, with recent history of acute ischemic CVA, was visiting family members in [place], with residual left-sided weakness, mild dysphagia, patient came back from [place] yesterday evening, wife reports she noticed worsening left-sided weakness, worsening dysphagia, and some confusion, patient reports a he didn't have significant changes at baseline, did fail swallow evaluation though in ED, patient reports he took his aspirin in a.m., CT head done in ED consistent with subacute to remote right MCA territory infarct with chronic microvascular ischemic changes.    Review of Systems    In addition to the HPI above,  No Fever-chills, No Headache, No changes with Vision or hearing, Ports worsening dysphagia No Chest pain, Cough or Shortness of Breath, No Abdominal pain, No Nausea or Vommitting, Bowel movements are regular, No Blood in stool or Urine, No dysuria, No new skin rashes or bruises, No new joints pains-aches,  Worsening left-sided weakness as per wife No recent weight gain or loss, No polyuria, polydypsia or polyphagia, No significant Mental Stressors.  A full 10 point Review of Systems was done, except as stated above, all other Review of Systems were negative.   Social History History  Substance Use Topics  . Smoking status: Current Every Day Smoker  . Smokeless tobacco: Not on file  . Alcohol Use: No     Family History History reviewed. No pertinent family history.   Prior to Admission medications   Medication Sig Start Date End Date Taking?  Authorizing Provider  aspirin 325 MG tablet Take 325 mg by mouth daily.   Yes Historical Provider, MD  folic acid (FOLVITE) 175 MCG tablet Take 400 mcg by mouth daily.   Yes Historical Provider, MD  nicotine (NICODERM CQ - DOSED IN MG/24 HOURS) 21 mg/24hr patch Place 21 mg onto the skin daily.   Yes Historical Provider, MD  Thiamine HCl (VITAMIN B-1) 250 MG tablet Take 250 mg by mouth daily.   Yes Historical Provider, MD  permethrin (ELIMITE) 5 % cream Apply from neck down at night after shower. Wash off in the morning. May repeat in 1 week if still symptomatic. Patient not taking: Reported on 07/06/2014 11/17/13   Ernestina Patches, MD    No Known Allergies  Physical Exam  Vitals  Blood pressure 133/81, pulse 58, temperature 98.1 F (36.7 C), temperature source Oral, resp. rate 11, SpO2 99 %.   1. General African-American male lying in bed in NAD,    2. Normal affect and insight, Not Suicidal or Homicidal, Awake Alert, Oriented X 3.  3.  ALL C.Nerves Intact, left-sided motor weakness, has left pronator drift.  4. Ears and Eyes appear Normal, Conjunctivae clear, PERRLA. Moist Oral Mucosa.  5. Supple Neck, No JVD, No cervical lymphadenopathy appriciated, No Carotid Bruits.  6. Symmetrical Chest wall movement, Good air movement bilaterally, CTAB.  7. RRR, No Gallops, Rubs or Murmurs, No Parasternal Heave.  8. Positive Bowel Sounds, Abdomen Soft, No tenderness, No organomegaly appriciated,No rebound -guarding or rigidity.  9.  No Cyanosis, Normal Skin Turgor, No Skin Rash or Bruise.  10. Good muscle tone,  joints appear normal , no effusions, Normal ROM.  11. No Palpable Lymph Nodes in Neck or Axillae    Data Review  CBC  Recent Labs Lab 07/06/14 1258  WBC 5.7  HGB 16.2  HCT 45.7  PLT 191  MCV 90.1  MCH 32.0  MCHC 35.4  RDW 13.1  LYMPHSABS 2.6  MONOABS 0.5  EOSABS 0.1  BASOSABS 0.0    ------------------------------------------------------------------------------------------------------------------  Chemistries   Recent Labs Lab 07/06/14 1258  NA 136  K 4.4  CL 104  CO2 24  GLUCOSE 99  BUN 8  CREATININE 1.09  CALCIUM 9.6  AST 21  ALT 18  ALKPHOS 53  BILITOT 2.0*   ------------------------------------------------------------------------------------------------------------------ CrCl cannot be calculated (Unknown ideal weight.). ------------------------------------------------------------------------------------------------------------------ No results for input(s): TSH, T4TOTAL, T3FREE, THYROIDAB in the last 72 hours.  Invalid input(s): FREET3   Coagulation profile  Recent Labs Lab 07/06/14 1420  INR 1.13   ------------------------------------------------------------------------------------------------------------------- No results for input(s): DDIMER in the last 72 hours. -------------------------------------------------------------------------------------------------------------------  Cardiac Enzymes No results for input(s): CKMB, TROPONINI, MYOGLOBIN in the last 168 hours.  Invalid input(s): CK ------------------------------------------------------------------------------------------------------------------ Invalid input(s): POCBNP   ---------------------------------------------------------------------------------------------------------------  Urinalysis    Component Value Date/Time   COLORURINE YELLOW 11/17/2013 1723   APPEARANCEUR CLEAR 11/17/2013 1723   LABSPEC 1.023 11/17/2013 1723   PHURINE 5.0 11/17/2013 1723   GLUCOSEU NEGATIVE 11/17/2013 1723   HGBUR NEGATIVE 11/17/2013 1723   BILIRUBINUR NEGATIVE 11/17/2013 1723   KETONESUR NEGATIVE 11/17/2013 1723   PROTEINUR NEGATIVE 11/17/2013 1723   UROBILINOGEN 0.2 11/17/2013 1723   NITRITE NEGATIVE 11/17/2013 1723   LEUKOCYTESUR NEGATIVE 11/17/2013 1723     ----------------------------------------------------------------------------------------------------------------  Imaging results:   Dg Chest 2 View  07/06/2014   CLINICAL DATA:  Increasing left-sided weakness. Previous stroke. Smoker.  EXAM: CHEST  2 VIEW  COMPARISON:  10/31/2008.  FINDINGS: Normal sized heart. Clear lungs. Increased density overlying the posterior aspect of the lower thoracic spine.  IMPRESSION: Possible mass overlying the posterior aspect of the lower thoracic spine. A chest CT with contrast is recommended.   Electronically Signed   By: Enrique Sack M.D.   On: 07/06/2014 15:54   Ct Head Wo Contrast  07/06/2014   CLINICAL DATA:  Worsened left side weakness. The patient was recently diagnosed with stroke at an outside facility.  EXAM: CT HEAD WITHOUT CONTRAST  TECHNIQUE: Contiguous axial images were obtained from the base of the skull through the vertex without intravenous contrast.  COMPARISON:  Head CT scan and brain MRI 10/31/2008.  FINDINGS: Hypoattenuation in the anterior right MCA territory correlates with recent diagnosis of infarct. Scattered areas of hypoattenuation are also seen in the deep white matter with lacunar infarction which appears remote in the right internal capsule identified. No evidence of acute infarction, hemorrhage, mass lesion, mass effect, midline shift or abnormal extra-axial fluid collection is identified. There is no hydrocephalus or pneumocephalus. The calvarium is intact.  IMPRESSION: No acute finding.  Findings consistent with subacute to remote right MCA territory infarct. Chronic microvascular ischemic change also noted.   Electronically Signed   By: Inge Rise M.D.   On: 07/06/2014 14:57    My personal review of EKG: Rhythm NSR, Rate  48 /min, QTc 375 , no Acute ST changes    Assessment & Plan  Active Problems:   CVA (cerebral infarction)  CVA - Patient has vague worsening of his symptoms  of recent acute CVA, unclear if this is  related to progression of his initial CVA, or acute ischemic event, versus infectious process as UA still pending. - Check urine analysis. - Neurology service consulted by ED, will repeat MRI brain, MRA head, carotid Dopplers, 2-D echo, monitor on telemetry, check lipid panel and hemoglobin A1c, continue with neuro check every 4 hours, PT/OT consult, SLP consult, patient will be kept nothing by mouth as he failed swallow evaluation in ED. - Continue with aspirin suppository 300 mg daily.   DVT Prophylaxis  Lovenox - SCDs   AM Labs Ordered, also please review Full Orders  Family Communication: Admission, patients condition and plan of care including tests being ordered have been discussed with the patient and wife who indicate understanding and agree with the plan and Code Status.  Code Status full  Likely DC to  admit to telemetry  Condition GUARDED    Time spent in minutes : 55 minutes    ELGERGAWY, DAWOOD M.D on 07/06/2014 at 5:14 PM  Between 7am to 7pm - Pager - [phone]  After 7pm go to [email or web address] - password [email or web address]  And look for the night coverage person covering me after hours  [facility] Office  [phone]   **Disclaimer: This note may have been dictated with voice recognition software. Similar sounding words can inadvertently be transcribed and this note may contain transcription errors which may not have been corrected upon publication of note.**

## 2014-07-07 ENCOUNTER — Inpatient Hospital Stay (HOSPITAL_COMMUNITY): Payer: BLUE CROSS/BLUE SHIELD

## 2014-07-07 ENCOUNTER — Encounter (HOSPITAL_COMMUNITY): Payer: Self-pay | Admitting: *Deleted

## 2014-07-07 DIAGNOSIS — Z720 Tobacco use: Secondary | ICD-10-CM | POA: Insufficient documentation

## 2014-07-07 DIAGNOSIS — E785 Hyperlipidemia, unspecified: Secondary | ICD-10-CM | POA: Insufficient documentation

## 2014-07-07 DIAGNOSIS — I639 Cerebral infarction, unspecified: Secondary | ICD-10-CM | POA: Insufficient documentation

## 2014-07-07 DIAGNOSIS — R001 Bradycardia, unspecified: Secondary | ICD-10-CM

## 2014-07-07 DIAGNOSIS — I63511 Cerebral infarction due to unspecified occlusion or stenosis of right middle cerebral artery: Secondary | ICD-10-CM

## 2014-07-07 DIAGNOSIS — I63231 Cerebral infarction due to unspecified occlusion or stenosis of right carotid arteries: Secondary | ICD-10-CM

## 2014-07-07 LAB — HEMOGLOBIN A1C
Hgb A1c MFr Bld: 5.6 % (ref <5.7)
MEAN PLASMA GLUCOSE: 114 mg/dL (ref <117)

## 2014-07-07 LAB — URINALYSIS, ROUTINE W REFLEX MICROSCOPIC
GLUCOSE, UA: NEGATIVE mg/dL
HGB URINE DIPSTICK: NEGATIVE
Ketones, ur: NEGATIVE mg/dL
Leukocytes, UA: NEGATIVE
Nitrite: NEGATIVE
Protein, ur: NEGATIVE mg/dL
SPECIFIC GRAVITY, URINE: 1.026 (ref 1.005–1.030)
Urobilinogen, UA: 1 mg/dL (ref 0.0–1.0)
pH: 5.5 (ref 5.0–8.0)

## 2014-07-07 LAB — LIPID PANEL
Cholesterol: 182 mg/dL (ref 0–200)
HDL: 24 mg/dL — AB (ref >39)
LDL Cholesterol: 135 mg/dL — ABNORMAL HIGH (ref 0–99)
Total CHOL/HDL Ratio: 7.6 RATIO
Triglycerides: 116 mg/dL (ref <150)
VLDL: 23 mg/dL (ref 0–40)

## 2014-07-07 LAB — TSH: TSH: 0.473 u[IU]/mL (ref 0.350–4.500)

## 2014-07-07 MED ORDER — ATORVASTATIN CALCIUM 10 MG PO TABS
20.0000 mg | ORAL_TABLET | Freq: Every day | ORAL | Status: DC
  Administered 2014-07-07: 20 mg via ORAL
  Filled 2014-07-07: qty 2

## 2014-07-07 MED ORDER — DOCUSATE SODIUM 100 MG PO CAPS
100.0000 mg | ORAL_CAPSULE | Freq: Two times a day (BID) | ORAL | Status: DC
  Administered 2014-07-07 – 2014-07-08 (×2): 100 mg via ORAL
  Filled 2014-07-07 (×2): qty 1

## 2014-07-07 MED ORDER — IOHEXOL 350 MG/ML SOLN: 50.0000 mL | Freq: Once | INTRAVENOUS | Status: AC | PRN

## 2014-07-07 MED ORDER — ASPIRIN EC 325 MG PO TBEC
325.0000 mg | DELAYED_RELEASE_TABLET | Freq: Every day | ORAL | Status: DC
  Administered 2014-07-07 – 2014-07-08 (×2): 325 mg via ORAL
  Filled 2014-07-07 (×2): qty 1

## 2014-07-07 MED ORDER — IOHEXOL 300 MG/ML  SOLN
50.0000 mL | Freq: Once | INTRAMUSCULAR | Status: AC | PRN
  Administered 2014-07-07: 50 mL via INTRAVENOUS

## 2014-07-07 NOTE — Evaluation (Signed)
Physical Therapy Evaluation Patient Details Name: Samuel Shaw MRN: [id number] DOB: May 27, 1961 Today's Date: 07/07/2014   History of Present Illness  Pt is a 54 yo male who was admitted after being d/c'd from hospital in [place] in late Dec with R MCA CVA.  Pt with L sided weakness that has progressed since dc from other hospital.  Pt found to have R basal ganglia infart as well.  Pt has had TIAs in past but has not had a physical in over 20 years.    Clinical Impression  Pt presents with the below listed impairments and will benefit from skilled PT intervention to address these impairments and increase functional independence with mobility. Recommending CIR consult for further rehab in preparation for home. Pt's wife is able to provide A upon discharge. Initial Acute PT eval limited due to pt tired from sitting up in the chair and requesting to go back to bed. Gait needs further assessment    Follow Up Recommendations CIR;Supervision/Assistance - 24 hour    Equipment Recommendations  Other (comment) (TBD at next venue of care)    Recommendations for Other Services Rehab consult     Precautions / Restrictions Precautions Precautions: Fall Restrictions Weight Bearing Restrictions: No      Mobility  Bed Mobility Overal bed mobility: Needs Assistance Bed Mobility: Sit to Supine     Supine to sit: Mod assist Sit to supine: Min guard   General bed mobility comments: Cues to bring LLE onto the bed when going to supine but able to with steady A to intiate movement. Pt able to bring trunk up in bed from supine position without A  Transfers Overall transfer level: Needs assistance Equipment used: 1 person hand held assist Transfers: Stand Pivot Transfers;Sit to/from Stand Sit to Stand: Mod assist Stand pivot transfers: Max assist;Mod assist;+2 safety/equipment (+2 for safety (RN))       General transfer comment: Cues for technique and hand/foot placement. Once in standing  required manual and verbal facilitation for weightshifting to advance LE's to perform the transfer  Ambulation/Gait Ambulation/Gait assistance: +2 physical assistance           General Gait Details: Need to be further assessed but pt declining due to just wanting to get back in bed. From limited assessment during transfer back to bed, would need +2 in open space but could try using R rail for UE support and feel pt might do well with that to start with gait trials  Stairs            Wheelchair Mobility    Modified Rankin (Stroke Patients Only) Modified Rankin (Stroke Patients Only) Pre-Morbid Rankin Score: No symptoms Modified Rankin: Moderately severe disability     Balance Overall balance assessment: Needs assistance Sitting-balance support: Bilateral upper extremity supported;Feet supported Sitting balance-Leahy Scale: Poor Sitting balance - Comments: Pt with L lean Postural control: Left lateral lean;Posterior lean Standing balance support: Single extremity supported Standing balance-Leahy Scale: Poor Standing balance comment: Posterior lateral lean in standing and during weightshifting                             Pertinent Vitals/Pain Pain Assessment: No/denies pain    Home Living Family/patient expects to be discharged to:: Inpatient rehab Living Arrangements: Spouse/significant other Available Help at Discharge: Family Type of Home: House           Additional Comments: 6 steps to enter with rail on R; then  1 step into the house. 1 level home    Prior Function Level of Independence: Independent         Comments: prior to dec pt was independent and worked in [employer].     Hand Dominance   Dominant Hand: Right    Extremity/Trunk Assessment   Upper Extremity Assessment: Defer to OT evaluation       LUE Deficits / Details: Pt with PROM WFL.  Strength  Shoulder 2/5, biceps 4/5, triceps 4/5, wrist and hand 3-/5.     Lower  Extremity Assessment: LLE deficits/detail   LLE Deficits / Details: denies sensory impairments. Strength grossly assessed at 3/5 but demonstrates decreased functional strength with standing/WB.  Cervical / Trunk Assessment: Other exceptions  Communication   Communication: No difficulties  Cognition Arousal/Alertness: Awake/alert Behavior During Therapy: Flat affect Overall Cognitive Status: Impaired/Different from baseline Area of Impairment: Awareness;Safety/judgement;Attention;Memory;Problem solving   Current Attention Level: Sustained     Safety/Judgement: Decreased awareness of safety;Decreased awareness of deficits   Problem Solving: Difficulty sequencing;Requires verbal cues;Requires tactile cues General Comments: Pt requiring cues for safety with mobility and slightly impulsive when attempting to get up chair. Decreased awareness noted to L sided weakness    General Comments General comments (skin integrity, edema, etc.): Educated family on safety due to weakness and cognitive impairments    Exercises        Assessment/Plan    PT Assessment Patient needs continued PT services  PT Diagnosis Difficulty walking;Acute pain;Hemiplegia non-dominant side;Altered mental status   PT Problem List Decreased strength;Decreased range of motion;Decreased activity tolerance;Decreased balance;Decreased mobility;Decreased coordination;Decreased cognition;Decreased knowledge of use of DME;Decreased safety awareness  PT Treatment Interventions DME instruction;Gait training;Stair training;Functional mobility training;Therapeutic activities;Therapeutic exercise;Balance training;Neuromuscular re-education;Cognitive remediation;Patient/family education;Wheelchair mobility training;Manual techniques   PT Goals (Current goals can be found in the Care Plan section) Acute Rehab PT Goals Patient Stated Goal: to get back in bed PT Goal Formulation: With patient/family Time For Goal Achievement:  07/21/14 Potential to Achieve Goals: Good    Frequency Min 4X/week   Barriers to discharge Inaccessible home environment 6 steps to enter home    Co-evaluation               End of Session   Activity Tolerance: Patient limited by fatigue Patient left: in bed;with call bell/phone within reach;with family/visitor present Nurse Communication: Mobility status         Time: 3614-4315 PT Time Calculation (min) (ACUTE ONLY): 13 min   Charges:   PT Evaluation $Initial PT Evaluation Tier I: 1 Procedure     PT G Codes:        Allayne Gitelman 07/07/2014, 11:55 AM

## 2014-07-07 NOTE — Evaluation (Signed)
Clinical/Bedside Swallow Evaluation Patient Details  Name: Samuel Shaw MRN: [id number] Date of Birth: 03/12/1961  Today's Date: 07/07/2014 Time: 0930-0950 SLP Time Calculation (min) (ACUTE ONLY): 20 min  Past Medical History:  Past Medical History  Diagnosis Date  . Stroke    Past Surgical History: History reviewed. No pertinent past surgical history. HPI:  Pt is a 54 yo male who was admitted after being d/c'd from hospital in [place] in late Dec with R MCA CVA. Pt with L sided weakness that has progressed since dc from other hospital. Pt found to have R basal ganglia infart as well. Pt has had TIAs in past but has not had a physical in over 20 years.   Assessment / Plan / Recommendation Clinical Impression  Pt presents with mild left-sided weakness and decreased sensation, resulting in left buccal pocketing of regular textures. Delayed cough observed. Pt was responsive to Min cues for use of lingual sweep to clear residuals. Recommend Dys 3 diet and thin liquids with full supervision for monitoring of oral residuals. Will continue to follow for tolerance and advancement.    Aspiration Risk  Mild    Diet Recommendation Dysphagia 3 (Mechanical Soft);Thin liquid   Liquid Administration via: Cup;Straw Medication Administration: Whole meds with puree Supervision: Patient able to self feed;Full supervision/cueing for compensatory strategies Compensations: Slow rate;Small sips/bites (lingual sweep) Postural Changes and/or Swallow Maneuvers: Seated upright 90 degrees    Other  Recommendations Oral Care Recommendations: Oral care BID   Follow Up Recommendations  Inpatient Rehab;24 hour supervision/assistance    Frequency and Duration min 2x/week  2 weeks   Pertinent Vitals/Pain n/a    SLP Swallow Goals     Swallow Study Prior Functional Status       General Date of Onset:  (since end of December) HPI: Pt is a 54 yo male who was admitted after being d/c'd from  hospital in [place] in late Dec with R MCA CVA. Pt with L sided weakness that has progressed since dc from other hospital. Pt found to have R basal ganglia infart as well. Pt has had TIAs in past but has not had a physical in over 20 years. Type of Study: Bedside swallow evaluation Previous Swallow Assessment: none in chart, family reports pocketing and intermittent coughing since the end of December Diet Prior to this Study: NPO Temperature Spikes Noted: Yes (low grade) Respiratory Status: Room air History of Recent Intubation: No Behavior/Cognition: Alert;Cooperative;Requires cueing Oral Cavity - Dentition: Adequate natural dentition Self-Feeding Abilities: Able to feed self Patient Positioning: Upright in chair Baseline Vocal Quality: Clear Volitional Cough: Strong Volitional Swallow: Able to elicit    Oral/Motor/Sensory Function Overall Oral Motor/Sensory Function: Impaired Labial Strength: Within Functional Limits Lingual ROM: Within Functional Limits Lingual Symmetry: Abnormal symmetry left Lingual Strength: Reduced Mandible: Within Functional Limits   Ice Chips Ice chips: Not tested   Thin Liquid Thin Liquid: Within functional limits Presentation: Cup;Self Fed;Straw    Nectar Thick Nectar Thick Liquid: Not tested   Honey Thick Honey Thick Liquid: Not tested   Puree Puree: Within functional limits Presentation: Self Fed;Spoon   Solid   GO    Solid: Impaired Presentation: Self Fed Oral Phase Impairments: Poor awareness of bolus;Impaired anterior to posterior transit Oral Phase Functional Implications: Left lateral sulci pocketing;Left anterior spillage Pharyngeal Phase Impairments: Cough - Delayed      Germain Osgood, M.A. CCC-SLP [phone]  Germain Osgood 07/07/2014,10:31 AM

## 2014-07-07 NOTE — Evaluation (Signed)
Speech Language Pathology Evaluation Patient Details Name: Samuel Shaw MRN: [id number] DOB: 02/12/1961 Today's Date: 07/07/2014 Time: 3903-0092 SLP Time Calculation (min) (ACUTE ONLY): 27 min  Problem List:  Patient Active Problem List   Diagnosis Date Noted  . CVA (cerebral infarction) 07/06/2014   Past Medical History:  Past Medical History  Diagnosis Date  . Stroke    Past Surgical History: History reviewed. No pertinent past surgical history. HPI:  Pt is a 54 yo male who was admitted after being d/c'd from hospital in [place] in late Dec with R MCA CVA. Pt with L sided weakness that has progressed since dc from other hospital. Pt found to have R basal ganglia infart as well. Pt has had TIAs in past but has not had a physical in over 20 years.   Assessment / Plan / Recommendation Clinical Impression  Pt has left-sided inattention, decreased sustained attention, and impaired working memory that impact his performance during basic and mildly complex functional tasks. SLP provided Mod cues for left sided attention during reading tasks. Pt will benefit from skilled SLP services and CIR level therapies to maximize functional cognition.     SLP Assessment  Patient needs continued Speech Lanaguage Pathology Services    Follow Up Recommendations  Inpatient Rehab;24 hour supervision/assistance    Frequency and Duration min 2x/week  2 weeks   Pertinent Vitals/Pain Pain Assessment: No/denies pain   SLP Goals  Patient/Family Stated Goal: wife wants him to get therapies Potential to Achieve Goals (ACUTE ONLY): Good  SLP Evaluation Prior Functioning  Cognitive/Linguistic Baseline: Within functional limits Type of Home: House  Lives With: Spouse Available Help at Discharge: Family Vocation: Full time employment   Cognition  Overall Cognitive Status: Impaired/Different from baseline Arousal/Alertness: Awake/alert Orientation Level: Oriented X4 Attention:  Sustained Sustained Attention: Impaired Sustained Attention Impairment: Verbal basic;Functional basic Memory: Impaired Memory Impairment: Decreased recall of new information Awareness: Impaired Awareness Impairment: Intellectual impairment;Emergent impairment Problem Solving: Impaired Problem Solving Impairment: Functional basic    Comprehension  Auditory Comprehension Overall Auditory Comprehension: Appears within functional limits for tasks assessed (for basic communication) Reading Comprehension Reading Status: Impaired Functional Environmental (signs, name badge): Impaired Interfering Components: Visual scanning;Left neglect/inattention    Expression Expression Primary Mode of Expression: Verbal Verbal Expression Overall Verbal Expression: Impaired Pragmatics: Impairment Impairments: Abnormal affect Written Expression Dominant Hand: Right Written Expression: Not tested   Oral / Motor Oral Motor/Sensory Function Overall Oral Motor/Sensory Function: Impaired Labial Strength: Within Functional Limits Lingual ROM: Within Functional Limits Lingual Symmetry: Abnormal symmetry left Lingual Strength: Reduced Mandible: Within Functional Limits Motor Speech Overall Motor Speech: Appears within functional limits for tasks assessed   GO      Germain Osgood, M.A. CCC-SLP [phone]  Germain Osgood 07/07/2014, 10:41 AM

## 2014-07-07 NOTE — Progress Notes (Signed)
CARE MANAGEMENT NOTE 07/07/2014  Patient:  Samuel Shaw, Samuel Shaw   Account Number:  [id number]  Date Initiated:  07/07/2014  Documentation initiated by:  Olga Coaster  Subjective/Objective Assessment:   ADMITTED FOR CVA     Action/Plan:   CM FOLLOWING FOR DCP   Anticipated DC Date:  07/10/2014   Anticipated DC Plan:  POSSIBLY INPT REHAB     DC Planning Services  CM consult         Status of service:  Completed, signed off Medicare Important Message given?   (If response is "NO", the following Medicare IM given date fields will be blank)  Per UR Regulation:  Reviewed for med. necessity/level of care/duration of stay  Comments:  1/5/2016Mindi Slicker RN,BSN,MHA [phone]

## 2014-07-07 NOTE — Progress Notes (Signed)
[facility]                                                                              Patient Demographics  Samuel Shaw, is a 54 y.o. male, DOB - 1960-12-03, MPN:[id number]  Admit date - 07/06/2014   Admitting Physician Phillips Climes, MD  Outpatient Primary MD for the patient is Tula Nakayama, MD  LOS - 1   Chief Complaint  Patient presents with  . Weakness        Assessment & Plan   Acute CVA -Patient recently admitted for CVA in [place] and was discharged -Per wife, patient started to become altered therefore she brought him into the emergency department. -Unclear if this is an extension or progression of his initial CVA versus new process -Neurology consulted and appreciated -MRI/MRA: Acute multifocal infarcts in the right middle cerebral artery territory and right occipital, right ICA occlusion -Echocardiogram and carotid Dopplers pending -LDL 135, hemoglobin A1c pending -PT and OT consulted and recommended inpatient rehabilitation -PM&R consulted -Will start patient on statin, continue aspirin  Bradycardia, chronic -Pending echocardiogram -Will obtain TSH -Per wife, this has been a chronic issue and his heart rate is always within the 40s to 50s.  Code Status: Full  Family Communication: wife at bedside  Disposition Plan: Admitted   Time Spent in minutes   30 minutes  Procedures  None  Consults   Neurology PM&R  DVT Prophylaxis  heparin   Lab Results  Component Value Date   PLT 191 07/06/2014    Medications  Scheduled Meds: .  stroke: mapping our early stages of recovery book   Does not apply Once  . aspirin  300 mg Rectal Daily  . heparin  5,000 Units Subcutaneous 3 times per day  . nicotine  21 mg Transdermal Daily   Continuous Infusions:  PRN Meds:.acetaminophen, iohexol  Antibiotics    Anti-infectives    None      Subjective:   Samuel Shaw seen and examined today.  Patient has no complaints this morning.  Would like to drink something. Denies any chest pain, shortness of breath or dizziness.   Objective:   Filed Vitals:   07/07/14 0300 07/07/14 0500 07/07/14 0745 07/07/14 0927  BP: 114/72 110/68  136/75  Pulse: 52 50  45  Temp: 99 F (37.2 C) 99.1 F (37.3 C)  98 F (36.7 C)  TempSrc: Oral Oral  Oral  Resp: 18 16  20   Height:   6\' 1"  (1.854 m)   Weight:   81 kg (178 lb 9.2 oz)   SpO2: 97% 96%  100%    Wt Readings from Last 3 Encounters:  07/07/14 81 kg (178 lb 9.2 oz)     Intake/Output Summary (Last 24 hours) at 07/07/14 1022 Last data filed at 07/07/14 0752  Gross per 24 hour  Intake      0 ml  Output    575 ml  Net   -575 ml    Exam  General: Well developed, well nourished, NAD, appears stated age  3: NCAT,mucous membranes moist.   Cardiovascular: S1 S2 auscultated, no rubs, murmurs or gallops. Bradycardic  Respiratory: Clear to auscultation bilaterally with equal  chest rise  Abdomen: Soft, nontender, nondistended, + bowel sounds  Extremities: warm dry without cyanosis clubbing or edema  Neuro: AAOx3, LUE and LLE 4/5 strength  Skin: Without rashes exudates or nodules  Psych: Normal affect and demeanor   Data Review   Micro Results No results found for this or any previous visit (from the past 240 hour(s)).  Radiology Reports Dg Chest 2 View  07/06/2014   CLINICAL DATA:  Increasing left-sided weakness. Previous stroke. Smoker.  EXAM: CHEST  2 VIEW  COMPARISON:  10/31/2008.  FINDINGS: Normal sized heart. Clear lungs. Increased density overlying the posterior aspect of the lower thoracic spine.  IMPRESSION: Possible mass overlying the posterior aspect of the lower thoracic spine. A chest CT with contrast is recommended.   Electronically Signed   By: Enrique Sack M.D.   On: 07/06/2014 15:54   Ct Head Wo Contrast  07/06/2014   CLINICAL DATA:  Worsened left side weakness. The patient was recently diagnosed with stroke at an outside facility.  EXAM: CT HEAD  WITHOUT CONTRAST  TECHNIQUE: Contiguous axial images were obtained from the base of the skull through the vertex without intravenous contrast.  COMPARISON:  Head CT scan and brain MRI 10/31/2008.  FINDINGS: Hypoattenuation in the anterior right MCA territory correlates with recent diagnosis of infarct. Scattered areas of hypoattenuation are also seen in the deep white matter with lacunar infarction which appears remote in the right internal capsule identified. No evidence of acute infarction, hemorrhage, mass lesion, mass effect, midline shift or abnormal extra-axial fluid collection is identified. There is no hydrocephalus or pneumocephalus. The calvarium is intact.  IMPRESSION: No acute finding.  Findings consistent with subacute to remote right MCA territory infarct. Chronic microvascular ischemic change also noted.   Electronically Signed   By: Inge Rise M.D.   On: 07/06/2014 14:57   Ct Angio Neck W/cm &/or Wo/cm  07/07/2014   CLINICAL DATA:  54 year old male with right hemispheric infarcts. Subsequent encounter.  EXAM: CT ANGIOGRAPHY NECK  TECHNIQUE: Multidetector CT imaging of the neck was performed using the standard protocol during bolus administration of intravenous contrast. Multiplanar CT image reconstructions and MIPs were obtained to evaluate the vascular anatomy. Carotid stenosis measurements (when applicable) are obtained utilizing NASCET criteria, using the distal internal carotid diameter as the denominator.  CONTRAST:  50 cc Omnipaque 350.  COMPARISON:  07/06/2014 brain MR. 11/03/2008 catheter angiogram.  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 Recommend annual imaging followup by CTA or MRA. This recommendation follows 2010 ACCF/AHA/AATS/ACR/ASA/SCA/SCAI/SIR/STS/SVM Guidelines for the Diagnosis and Management of Patients With Thoracic Aortic Disease. Circulation.  2010; 121: E332-R518  Right carotid system: Right internal carotid artery is occluded 1 cm  above its origin. Etiology of occlusion is indeterminate.  Left carotid system: Focal plaque proximal left internal carotid artery without measurable/ significant stenosis.  Vertebral arteries:Mild narrowing proximal right vertebral artery.  Other: Calcified mediastinal lymph nodes suggestive of prior granulomatous exposure. Small thyroid lesions more notable on the left measuring up to 1.2 cm. Dental disease. Subtle low-density structure within the left palatine tonsil may represent result of prior inflammation without discrete mass identified. Cervical spondylotic changes with spinal stenosis most notable C3-4 thru C5-6 level.  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 Etiology indeterminate.  Focal plaque proximal left internal carotid artery without measurable/significant stenosis.  Mild narrowing proximal right vertebral artery.  Slightly ectatic ascending thoracic aorta (3.4 cm) with follow-up as noted above.  Surrounding incidental findings as detailed above.  These results will be called to the ordering clinician or representative by the Radiologist Assistant, and communication documented in the PACS or zVision Dashboard.   Electronically Signed   By: Chauncey Cruel M.D.   On: 07/07/2014 09:00   Mr Brain Wo Contrast  07/06/2014   CLINICAL DATA:  Progressive LEFT-sided weakness after stroke in late December (acute RIGHT basal ganglia infarct). History of transient ischemic attacks, and possible RIGHT internal carotid artery stenosis.  EXAM: MRI HEAD WITHOUT CONTRAST  MRA HEAD WITHOUT CONTRAST  TECHNIQUE: Multiplanar, multiecho pulse sequences of the brain and surrounding structures were obtained without intravenous contrast. Angiographic images of the head were obtained using MRA technique without contrast.  COMPARISON:  CT of the head July 06, 2014 and MRI of the head Oct 31, 2008  FINDINGS: MRI HEAD FINDINGS  Patchy areas of reduced diffusion within the RIGHT frontal,  anterior occipital lobes, as well as the subcentimeter foci of reduced diffusion within the RIGHT basal ganglia, 14 mm focus of reduced diffusion within the RIGHT globus pallidus. Reduced diffusion within all lesions consistent with synchronous ischemia. Blood sensitive sequences extremely motion degraded, limiting sensitivity for petechial hemorrhage, no lobar hematoma.  Areas of acute ischemia demonstrate bright FLAIR signal, superimposed scattered subcentimeter FLAIR T2 hyperintensities within the supratentorial brain. No midline shift. The ventricles and sulci are overall normal for patient's age.  No abnormal extra-axial fluid collections. Complete loss of the RIGHT internal carotid artery flow void, see below.  Mild paranasal sinus mucosal thickening without air-fluid levels. Ocular globes and orbital contents are unremarkable. Mastoid air cells are well aerated. No suspicious calvarial bone marrow signal. No abnormal sellar expansion. No cerebellar tonsillar ectopia.  MRA HEAD FINDINGS  Anterior circulation: Thready flow related enhancement within RIGHT cervical internal carotid artery, minimal flow related enhancement of RIGHT petrous, complete loss of the RIGHT cavernous and supra clinoid internal carotid artery flow related enhancement. LEFT cervical, petrous, cavernous and supra clinoid internal carotid artery demonstrates robust flow related enhancement.  Apparent retrograde flow related enhancement of the RIGHT carotid terminus. Tiny anterior communicating artery is likely present. Generalized diminutive flow related enhancement RIGHT middle cerebral artery, normal flow related enhancement of the anterior cerebral arteries and LEFT middle cerebral artery.  No aneurysm.  No large vessel occlusion, hemodynamically significant stenosis, abnormal luminal irregularity, aneurysm.  Posterior circulation: Codominant vertebral arteries. Basilar artery is patent, with normal flow related enhancement of the main  branch vessels. Normal flow related enhancement of the posterior cerebral arteries. Tiny bilateral posterior communicating arteries are present.  No large vessel occlusion, hemodynamically significant stenosis, abnormal luminal irregularity, aneurysm.  IMPRESSION: MRI head: Acute multifocal infarcts within RIGHT middle cerebral artery territory (including RIGHT basal ganglia). RIGHT anterior occipital likely within RIGHT middle cerebral artery territory/watershed. Limited blood sensitive sequence without lobar hematoma.  Mild white matter changes, independent of acute areas of ischemia suggest chronic small vessel ischemic disease.  MRA head: RIGHT internal carotid artery occlusion. Bilateral posterior communicating arteries and suspected tiny anterior communicating artery present. Diminutive RIGHT middle cerebral artery flow related enhancement. Recommend CT angiogram of the neck for further evaluation.  These results will be called to the ordering clinician or representative by the Radiologist Assistant, and communication documented in the PACS or zVision Dashboard.   Electronically Signed   By: Elon Alas   On: 07/06/2014 21:41   Mr Jodene Nam Head/brain Wo Cm  07/06/2014   CLINICAL DATA:  Progressive LEFT-sided weakness after  stroke in late December (acute RIGHT basal ganglia infarct). History of transient ischemic attacks, and possible RIGHT internal carotid artery stenosis.  EXAM: MRI HEAD WITHOUT CONTRAST  MRA HEAD WITHOUT CONTRAST  TECHNIQUE: Multiplanar, multiecho pulse sequences of the brain and surrounding structures were obtained without intravenous contrast. Angiographic images of the head were obtained using MRA technique without contrast.  COMPARISON:  CT of the head July 06, 2014 and MRI of the head Oct 31, 2008  FINDINGS: MRI HEAD FINDINGS  Patchy areas of reduced diffusion within the RIGHT frontal, anterior occipital lobes, as well as the subcentimeter foci of reduced diffusion within the RIGHT  basal ganglia, 14 mm focus of reduced diffusion within the RIGHT globus pallidus. Reduced diffusion within all lesions consistent with synchronous ischemia. Blood sensitive sequences extremely motion degraded, limiting sensitivity for petechial hemorrhage, no lobar hematoma.  Areas of acute ischemia demonstrate bright FLAIR signal, superimposed scattered subcentimeter FLAIR T2 hyperintensities within the supratentorial brain. No midline shift. The ventricles and sulci are overall normal for patient's age.  No abnormal extra-axial fluid collections. Complete loss of the RIGHT internal carotid artery flow void, see below.  Mild paranasal sinus mucosal thickening without air-fluid levels. Ocular globes and orbital contents are unremarkable. Mastoid air cells are well aerated. No suspicious calvarial bone marrow signal. No abnormal sellar expansion. No cerebellar tonsillar ectopia.  MRA HEAD FINDINGS  Anterior circulation: Thready flow related enhancement within RIGHT cervical internal carotid artery, minimal flow related enhancement of RIGHT petrous, complete loss of the RIGHT cavernous and supra clinoid internal carotid artery flow related enhancement. LEFT cervical, petrous, cavernous and supra clinoid internal carotid artery demonstrates robust flow related enhancement.  Apparent retrograde flow related enhancement of the RIGHT carotid terminus. Tiny anterior communicating artery is likely present. Generalized diminutive flow related enhancement RIGHT middle cerebral artery, normal flow related enhancement of the anterior cerebral arteries and LEFT middle cerebral artery.  No aneurysm.  No large vessel occlusion, hemodynamically significant stenosis, abnormal luminal irregularity, aneurysm.  Posterior circulation: Codominant vertebral arteries. Basilar artery is patent, with normal flow related enhancement of the main branch vessels. Normal flow related enhancement of the posterior cerebral arteries. Tiny bilateral  posterior communicating arteries are present.  No large vessel occlusion, hemodynamically significant stenosis, abnormal luminal irregularity, aneurysm.  IMPRESSION: MRI head: Acute multifocal infarcts within RIGHT middle cerebral artery territory (including RIGHT basal ganglia). RIGHT anterior occipital likely within RIGHT middle cerebral artery territory/watershed. Limited blood sensitive sequence without lobar hematoma.  Mild white matter changes, independent of acute areas of ischemia suggest chronic small vessel ischemic disease.  MRA head: RIGHT internal carotid artery occlusion. Bilateral posterior communicating arteries and suspected tiny anterior communicating artery present. Diminutive RIGHT middle cerebral artery flow related enhancement. Recommend CT angiogram of the neck for further evaluation.  These results will be called to the ordering clinician or representative by the Radiologist Assistant, and communication documented in the PACS or zVision Dashboard.   Electronically Signed   By: Elon Alas   On: 07/06/2014 21:41    CBC  Recent Labs Lab 07/06/14 1258  WBC 5.7  HGB 16.2  HCT 45.7  PLT 191  MCV 90.1  MCH 32.0  MCHC 35.4  RDW 13.1  LYMPHSABS 2.6  MONOABS 0.5  EOSABS 0.1  BASOSABS 0.0    Chemistries   Recent Labs Lab 07/06/14 1258  NA 136  K 4.4  CL 104  CO2 24  GLUCOSE 99  BUN 8  CREATININE 1.09  CALCIUM 9.6  AST 21  ALT 18  ALKPHOS 53  BILITOT 2.0*   ------------------------------------------------------------------------------------------------------------------ estimated creatinine clearance is 88.6 mL/min (by C-G formula based on Cr of 1.09). ------------------------------------------------------------------------------------------------------------------ No results for input(s): HGBA1C in the last 72 hours. ------------------------------------------------------------------------------------------------------------------  Recent Labs   07/07/14 0402  CHOL 182  HDL 24*  LDLCALC 135*  TRIG 116  CHOLHDL 7.6   ------------------------------------------------------------------------------------------------------------------ No results for input(s): TSH, T4TOTAL, T3FREE, THYROIDAB in the last 72 hours.  Invalid input(s): FREET3 ------------------------------------------------------------------------------------------------------------------ No results for input(s): VITAMINB12, FOLATE, FERRITIN, TIBC, IRON, RETICCTPCT in the last 72 hours.  Coagulation profile  Recent Labs Lab 07/06/14 1420  INR 1.13    No results for input(s): DDIMER in the last 72 hours.  Cardiac Enzymes No results for input(s): CKMB, TROPONINI, MYOGLOBIN in the last 168 hours.  Invalid input(s): CK ------------------------------------------------------------------------------------------------------------------ Invalid input(s): POCBNP    Ky Moskowitz D.O. on 07/07/2014 at 10:22 AM  Between 7am to 7pm - Pager - [phone]  After 7pm go to [email or web address] - password [email or web address]  And look for the night coverage person covering for me after hours  [facility] Office  [phone]

## 2014-07-07 NOTE — Progress Notes (Signed)
Nutrition Brief Note  Patient identified on the Malnutrition Screening Tool (MST) Report  Wt Readings from Last 15 Encounters:  07/07/14 178 lb 9.2 oz (81 kg)    Body mass index is 23.56 kg/(m^2). Patient meets criteria for Normal Weight based on current BMI. Pt denies any weight loss, stating that he usually weighs 165 lbs. Pt reports that he has a good appetite and usually eats 4-5 small meals daily. He reports eating poorly for 1 day due to hospitalization admission. Per nursing notes, pt consumed 35-65% of meals today. Pt reports last BM was 07/03/13; MD made aware. RD provided diet tips for constipation. Pt requesting snacks due to inability to consume large meals- RD to order.   Current diet order is Dysphagia 3, patient is consuming approximately 65% of meals at this time. Labs and medications reviewed.   No nutrition interventions warranted at this time. If nutrition issues arise, please consult RD.   Pryor Ochoa RD, LDN Inpatient Clinical Dietitian Pager: [phone] After Hours Pager: [phone]

## 2014-07-07 NOTE — Progress Notes (Signed)
STROKE TEAM PROGRESS NOTE   HISTORY Samuel Shaw is an 54 y.o. male PMH of 2 TIAs 2 years ago, left carotid blockage, and known CVA in late December 2015 with recent discharge from hospital in [place] (Dr. Marianne Sofia at [facility] [place]) presenting with progressive left-sided weakness since that discharge. These symptoms are similar to his stroke in December, the family just feels they are getting worse. His wife also feels his speech is getting progressively harder to understand.  A review of records from [place] are pertinent for a head CT showing an acute right basal ganglia infarct. Notes also mention a possible R ICA stenosis though no carotid ultrasound report or CTA/MRA of the neck is included. Discharged on ASA 325mg  daily.   Head CT imaging reviewed and no acute process, + signs of a subacute right MCA infarct.   He was last known well 06/24/3014 at 0800. Patient was not administered TPA secondary to outside window, recent CVA. He was admitted for further evaluation and treatment.   SUBJECTIVE (INTERVAL HISTORY) His family is at the bedside.  They report his last stroke occurred while he was driving to [place] in Dec. He started dropping things and laid down x 2 days. He was taken to see the doctor 12/27 - they said he had a TIA, a mini stroke with left sided weakness and slurred speech. After stabilization, he was sent back to [place] for rehab. Family bring him to ED for further placement. Overall his condition is stable. Wife is interested in rehab.   OBJECTIVE Temp:  [97.9 F (36.6 C)-99.2 F (37.3 C)] 97.9 F (36.6 C) (01/05 1756) Pulse Rate:  [45-54] 54 (01/05 1756) Cardiac Rhythm:  [-] Sinus bradycardia (01/05 0500) Resp:  [16-20] 17 (01/05 1756) BP: (108-136)/(68-75) 128/69 mmHg (01/05 1756) SpO2:  [95 %-100 %] 100 % (01/05 1756) Weight:  [178 lb 9.2 oz (81 kg)] 178 lb 9.2 oz (81 kg) (01/05 0745)  No results for input(s): GLUCAP in the last 168 hours.  Recent  Labs Lab 07/06/14 1258  NA 136  K 4.4  CL 104  CO2 24  GLUCOSE 99  BUN 8  CREATININE 1.09  CALCIUM 9.6    Recent Labs Lab 07/06/14 1258  AST 21  ALT 18  ALKPHOS 53  BILITOT 2.0*  PROT 7.0  ALBUMIN 4.2    Recent Labs Lab 07/06/14 1258  WBC 5.7  NEUTROABS 2.5  HGB 16.2  HCT 45.7  MCV 90.1  PLT 191   No results for input(s): CKTOTAL, CKMB, CKMBINDEX, TROPONINI in the last 168 hours.  Recent Labs  07/06/14 1420  LABPROT 14.6  INR 1.13    Recent Labs  07/07/14 0007  COLORURINE AMBER*  LABSPEC 1.026  PHURINE 5.5  GLUCOSEU NEGATIVE  HGBUR NEGATIVE  BILIRUBINUR SMALL*  KETONESUR NEGATIVE  PROTEINUR NEGATIVE  UROBILINOGEN 1.0  NITRITE NEGATIVE  LEUKOCYTESUR NEGATIVE       Component Value Date/Time   CHOL 182 07/07/2014 0402   TRIG 116 07/07/2014 0402   HDL 24* 07/07/2014 0402   CHOLHDL 7.6 07/07/2014 0402   VLDL 23 07/07/2014 0402   LDLCALC 135* 07/07/2014 0402   Lab Results  Component Value Date   HGBA1C 5.6 07/07/2014      Component Value Date/Time   LABOPIA NONE DETECTED 10/31/2008 0009   COCAINSCRNUR NONE DETECTED 10/31/2008 0009   LABBENZ NONE DETECTED 10/31/2008 0009   AMPHETMU NONE DETECTED 10/31/2008 0009   THCU POSITIVE* 10/31/2008 0009  LABBARB  10/31/2008 0009    NONE DETECTED        DRUG SCREEN FOR MEDICAL PURPOSES ONLY.  IF CONFIRMATION IS NEEDED FOR ANY PURPOSE, NOTIFY LAB WITHIN 5 DAYS.        LOWEST DETECTABLE LIMITS FOR URINE DRUG SCREEN Drug Class       Cutoff (ng/mL) Amphetamine      1000 Barbiturate      200 Benzodiazepine   409 Tricyclics       811 Opiates          300 Cocaine          300 THC              50    No results for input(s): ETH in the last 168 hours.  I have personally reviewed the radiological images below and agree with the radiology interpretations.  Dg Chest 2 View 07/06/2014   CLINICAL DATA:  Increasing left-sided weakness. Previous stroke. Smoker.  EXAM: CHEST  2 VIEW  COMPARISON:   10/31/2008.  FINDINGS: Normal sized heart. Clear lungs. Increased density overlying the posterior aspect of the lower thoracic spine.  IMPRESSION: Possible mass overlying the posterior aspect of the lower thoracic spine. A chest CT with contrast is recommended.   Electronically Signed   By: Enrique Sack M.D.   On: 07/06/2014 15:54   Ct Head Wo Contrast 07/06/2014    No acute finding.  Findings consistent with subacute to remote right MCA territory infarct. Chronic microvascular ischemic change also noted.     Mr Brain Wo Contrast 07/06/2014    Acute multifocal infarcts within RIGHT middle cerebral artery territory (including RIGHT basal ganglia). RIGHT anterior occipital likely within RIGHT middle cerebral artery territory/watershed. Limited blood sensitive sequence without lobar hematoma.  Mild white matter changes, independent of acute areas of ischemia suggest chronic small vessel ischemic disease.    Mr Jodene Nam Head/brain Wo Cm 07/06/2014    RIGHT internal carotid artery occlusion. Bilateral posterior communicating arteries and suspected tiny anterior communicating artery present. Diminutive RIGHT middle cerebral artery flow related enhancement. Recommend CT angiogram of the neck for further evaluation.    CT angio neck 07/07/2014 Right internal carotid artery is occluded 1 cm above its origin. Etiology indeterminate. Focal plaque proximal left internal carotid artery without measurable/significant stenosis. Mild narrowing proximal right vertebral artery. Slightly ectatic ascending thoracic aorta (3.4 cm). Surrounding incidental findings.  2D echo - pending  PHYSICAL EXAM  Temp:  [97.9 F (36.6 C)-99.2 F (37.3 C)] 97.9 F (36.6 C) (01/05 1756) Pulse Rate:  [45-54] 54 (01/05 1756) Resp:  [16-20] 17 (01/05 1756) BP: (108-136)/(68-75) 128/69 mmHg (01/05 1756) SpO2:  [95 %-100 %] 100 % (01/05 1756) Weight:  [178 lb 9.2 oz (81 kg)] 178 lb 9.2 oz (81 kg) (01/05 0745)  General - Well nourished, well  developed, in no apparent distress.  Ophthalmologic - Sharp disc margins OU.  Cardiovascular - Regular rate and rhythm with no murmur.  Mental Status -  Level of arousal and orientation to time, place, and person were intact. Language including expression, naming, repetition, comprehension was assessed and found intact. Fund of Knowledge was assessed and was intact.  Cranial Nerves II - XII - II - Visual field intact OU. III, IV, VI - Extraocular movements intact. V - Facial sensation intact bilaterally. VII - left facial droop. VIII - Hearing & vestibular intact bilaterally. X - Palate elevates symmetrically, mild dysarthria. XI - Chin turning & shoulder shrug intact bilaterally. XII -  Tongue protrusion intact.  Motor Strength - The patient's strength was 3-/5 LUE proximal and 4-/5 distal, and 4/5 LLE proximal and 3/5 distal. RUE and RLE 5/5.  Bulk was normal and fasciculations were absent.   Motor Tone - Muscle tone was assessed at the neck and appendages and was normal.  Reflexes - The patient's reflexes were 1+ in all extremities and he had no pathological reflexes.  Sensory - Light touch, temperature/pinprick were assessed and were symmetrical.    Coordination - The patient had normal movements in the right hand and foot with no ataxia or dysmetria.  Tremor was absent.  Gait and Station - not able to test due to weakness.  ASSESSMENT/PLAN [name] is a 54 y.o. male with history of 2 TIAs 2 years ago, left carotid blockage, and known CVA in late December 2015 presenting with progressive left-sided weakness since discharge. He did not receive IV t-PA due to stroke within past 90 days.   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 Etiology of R ICA occlusion unclear, arthero vs. Dissection. Pt smoker with supraclinoid ICA narrowing  suggest atherosclerosis as etiology of R ICA occlusion, but lack of other big vessel arthero changes favor dissection as the etiology.  Resultant  Left hemiparesis, dysarthria,   MRI  See above  MRA  R ICA occlusion w/ decreased R MCA flow  CT angio  R ICA occlusion  2D Echo  pending   HgbA1c pending  Heparin 5000 units sq tid for VTE prophylaxis  DIET DYS 3 thin liquids  aspirin 325 mg orally every day prior to this admission (was on no antithrombotic prior to current stroke), now on aspirin 325 mg orally every day.   Patient counseled to be compliant with his antithrombotic medications  Ongoing aggressive stroke risk factor management  Keep blood pressure elevated to maintain cerebral perfusion, goal SBP 120-150.   Stay hydrated  Therapy recommendations:  CIR  Disposition:  CIR following  R ICA occlusion  Dissection vs. Artherosclerosis  Avoid hypotension  Follow up imaging in 3 months.  Hyperlipidemia  Home meds:  No statin  LDL 135, goal < 70  Now on lipitor 20 mg daily  Continue statin at discharge  Tobacco abuse  Current smoker  Smoking cessation counseling provided  Nicotine patch provided  Pt is willing to quit  Other Stroke Risk Factors  Unhealthy diet  Other medical problems  Chronic bradycardia  Hospital day # [place] for Pager information 07/07/2014 10:49 AM   I, the attending vascular neurologist, have personally obtained a history, examined the patient, evaluated laboratory data, individually viewed imaging studies and agree with radiology interpretations. Together with the NP/PA, we formulated the assessment and plan of care which reflects our mutual decision.  I have made any additions or clarifications directly to the above note and agree with the findings and plan as currently documented.   54 yo M smoker had recent stroke in [place] due to right ICA occlusion. He was admitted for  some worsening left sided weakness but more likely due to anxiety and fatigue. No new findings today. MRI confirmed right MCA stroke and right ICA occlusion. Dissection vs. Arthrosclerosis. No intervention indicated at this time. Continue ASA and statin, quit smoking, PT/OT, follow up in clinic and repeat CTA in 3 months.  Neurology will sign off. Please call with questions. Pt will follow up with Dr. Erlinda Hong at [facility] in about 2 months. Thanks for the consult.  Rosalin Hawking, MD PhD Stroke Neurology 07/07/2014 6:22 PM       To contact Stroke Continuity provider, please refer to [email or web address] After hours, contact General Neurology

## 2014-07-07 NOTE — Progress Notes (Signed)
Nurse notified MD, Dr. Ree Kida, per text message, that pt's heart rate in 58s with lowest at 38, per telemtry monitoring. no acute distress noted. pt sitting in chair. Will monitor pt closely   Angeline Slim I 07/07/2014 9:37 AM

## 2014-07-07 NOTE — Consult Note (Signed)
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 male with history of tobacco abuse, TIA 2 years ago, CVA late December with recent discharge from hospital in [place] where he was visiting and maintained on aspirin 325 mg daily. Patient lives with his wife in [place]. Presented 07/06/2014 with progressive left-sided weakness since recent discharge from the hospital. CT scan reviewed from outside hospital in December showed acute right basal ganglia infarct. There was possible right ICA stenosis identified on carotid ultrasound. MRI of the brain 07/06/2014 showed acute multifocal infarcts within right middle cerebral artery territory including right basal ganglia, right anterior occipital and right middle cerebral artery territory/ watershed. 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 CTA and GO of the neck showed right ICA occlusion 1 cm above its origin. Echocardiogram pending. Patient did not receive TPA. Neurology follow-up currently maintained on aspirin therapy. Subcutaneous heparin added for DVT prophylaxis. Mechanical soft diet. Occupational therapy evaluation completed 07/07/2013 with recommendations of physical medicine rehabilitation consult.   Review of Systems  Neurological: Positive for dizziness and weakness.  All other systems reviewed and are negative.  Past Medical History  Diagnosis Date  . Stroke    History reviewed. No pertinent past surgical history. History reviewed. No pertinent family history. Social History:  reports that he has been smoking.  He does not have any smokeless tobacco history on file. He reports that he does not drink alcohol or use illicit drugs. Allergies: No Known Allergies Medications Prior to Admission    Medication Sig Dispense Refill  . aspirin 325 MG tablet Take 325 mg by mouth daily.    . folic acid (FOLVITE) 798 MCG tablet Take 400 mcg by mouth daily.    . nicotine (NICODERM CQ - DOSED IN MG/24 HOURS) 21 mg/24hr patch Place 21 mg onto the skin daily.    . Thiamine HCl (VITAMIN B-1) 250 MG tablet Take 250 mg by mouth daily.    . permethrin (ELIMITE) 5 % cream Apply from neck down at night after shower. Wash off in the morning. May repeat in 1 week if still symptomatic. (Patient not taking: Reported on 07/06/2014) 60 g 0    Home: Home Living Family/patient expects to be discharged to:: Inpatient rehab Living Arrangements: Spouse/significant other  Functional History: Prior Function Level of Independence: Independent Comments: prior to dec pt was independent and worked in [employer]. Functional Status:  Mobility: Bed Mobility Overal bed mobility: Needs Assistance Bed Mobility: Supine to Sit Supine to sit: Mod assist General bed mobility comments: Bed was flat.  Pt gets started but needed tactile cues to continue coming to side of bed.   Transfers Overall transfer level: Needs assistance Equipment used: 1 person hand held assist Transfers: Stand Pivot Transfers, Sit to/from Stand Sit to Stand: Mod assist Stand pivot transfers: Max assist General transfer comment: Pt was max assist to transfer to weak side and mod to transfer to strong side.      ADL: ADL Overall ADL's : Needs assistance/impaired Eating/Feeding: NPO Grooming: Wash/dry hands, Wash/dry face, Oral care, Minimal assistance, Sitting Grooming Details (indicate cue type and reason): Pt requires cues to attempt to use L hand and VCs to problem solve how to do things diffferently with decreased use of L side.   Upper Body Bathing: Moderate assistance,  Sitting Upper Body Bathing Details (indicate cue type and reason): assist with hand over hand using L side to wash R. Lower Body Bathing: Maximal assistance, Sit to/from  stand Lower Body Bathing Details (indicate cue type and reason): assist in standing. Pt with very poor balance.  Cues to cross L leg over R to attempt to reach foot. Upper Body Dressing : Maximal assistance, Sitting Upper Body Dressing Details (indicate cue type and reason): educated on hemi dressing technique. Lower Body Dressing: Maximal assistance, Sit to/from stand Lower Body Dressing Details (indicate cue type and reason): poor standing balance and safety. Toilet Transfer: Moderate assistance, Stand-pivot, BSC Toileting- Clothing Manipulation and Hygiene: Maximal assistance, Sit to/from stand Toileting - Clothing Manipulation Details (indicate cue type and reason): Pt requires max assist +1 for toileting and cleaning self. Functional mobility during ADLs: Maximal assistance General ADL Comments: Pt requires great assist for adls due to L side weakness and decreased safety awareness.  Cognition: Cognition Overall Cognitive Status: Impaired/Different from baseline Orientation Level: Oriented X4 Cognition Arousal/Alertness: Awake/alert Behavior During Therapy: Flat affect Overall Cognitive Status: Impaired/Different from baseline Area of Impairment: Safety/judgement, Problem solving Safety/Judgement: Decreased awareness of safety, Decreased awareness of deficits Problem Solving: Slow processing, Difficulty sequencing, Requires verbal cues General Comments: Pt answered all questions correctly.  Very flat affect; question depression.  Blood pressure 136/75, pulse 45, temperature 98 F (36.7 C), temperature source Oral, resp. rate 20, height 6\' 1"  (1.854 m), weight 81 kg (178 lb 9.2 oz), SpO2 100 %. Physical Exam  Vitals reviewed. Constitutional: He appears well-developed.  HENT:  Head: Normocephalic and atraumatic.  Left facial weakness  Eyes: EOM are normal. Scleral icterus is present.  Neck: Normal range of motion. Neck supple. No tracheal deviation present. No thyromegaly  present.  Cardiovascular: Normal rate and regular rhythm.   Respiratory: Effort normal and breath sounds normal. No respiratory distress.  GI: Soft. Bowel sounds are normal. He exhibits no distension.  Musculoskeletal: Normal range of motion.  Neurological: He is alert.  Left central 7 and tongue deviation. Speech slightly slurred,. Right gaze preference. MMT-- Right: 4+/5 deltoid, 4+/5 bicep, 4+/5 tricep, 4+/5 wrist extension, 4+/5 hand intrinsics.      4+/5 hip flexor, 4+/5 knee extension, 4+/5 ankle dorsiflexion, 4+/5 ankle plantarflexion.  Left: 3/5 deltoid, 3+/5 bicep, 3+/5 tricep, 3+/5 wrist extension, 3+/5 hand intrinsics.      3/5 hip flexor, 3/5 knee extension, 3+/5 ankle dorsiflexion, 3+/5 ankle plantarflexion. Mild sensory changes on the left?  Patient is able to state his name and age. Follows simple commands  Skin: Skin is warm and dry.  Psychiatric:  Extremely flat. Slow to engage    Results for orders placed or performed during the hospital encounter of 07/06/14 (from the past 24 hour(s))  CBC     Status: None   Collection Time: 07/06/14 12:58 PM  Result Value Ref Range   WBC 5.7 4.0 - 10.5 K/uL   RBC 5.07 4.22 - 5.81 MIL/uL   Hemoglobin 16.2 13.0 - 17.0 g/dL   HCT 45.7 39.0 - 52.0 %   MCV 90.1 78.0 - 100.0 fL   MCH 32.0 26.0 - 34.0 pg   MCHC 35.4 30.0 - 36.0 g/dL   RDW 13.1 11.5 - 15.5 %   Platelets 191 150 - 400 K/uL  Differential     Status: None   Collection Time: 07/06/14 12:58 PM  Result Value Ref Range   Neutrophils Relative % 45 43 - 77 %   Neutro Abs  2.5 1.7 - 7.7 K/uL   Lymphocytes Relative 44 12 - 46 %   Lymphs Abs 2.6 0.7 - 4.0 K/uL   Monocytes Relative 9 3 - 12 %   Monocytes Absolute 0.5 0.1 - 1.0 K/uL   Eosinophils Relative 1 0 - 5 %   Eosinophils Absolute 0.1 0.0 - 0.7 K/uL   Basophils Relative 1 0 - 1 %   Basophils Absolute 0.0 0.0 - 0.1 K/uL  Comprehensive metabolic panel     Status: Abnormal   Collection Time: 07/06/14 12:58 PM  Result Value  Ref Range   Sodium 136 135 - 145 mmol/L   Potassium 4.4 3.5 - 5.1 mmol/L   Chloride 104 96 - 112 mEq/L   CO2 24 19 - 32 mmol/L   Glucose, Bld 99 70 - 99 mg/dL   BUN 8 6 - 23 mg/dL   Creatinine, Ser 1.09 0.50 - 1.35 mg/dL   Calcium 9.6 8.4 - 10.5 mg/dL   Total Protein 7.0 6.0 - 8.3 g/dL   Albumin 4.2 3.5 - 5.2 g/dL   AST 21 0 - 37 U/L   ALT 18 0 - 53 U/L   Alkaline Phosphatase 53 39 - 117 U/L   Total Bilirubin 2.0 (H) 0.3 - 1.2 mg/dL   GFR calc non Af Amer 76 (L) >90 mL/min   GFR calc Af Amer 88 (L) >90 mL/min   Anion gap 8 5 - 15  I-stat troponin, ED (not at [facility])     Status: None   Collection Time: 07/06/14  1:16 PM  Result Value Ref Range   Troponin i, poc 0.00 0.00 - 0.08 ng/mL   Comment 3          APTT     Status: None   Collection Time: 07/06/14  2:20 PM  Result Value Ref Range   aPTT 29 24 - 37 seconds  Protime-INR     Status: None   Collection Time: 07/06/14  2:20 PM  Result Value Ref Range   Prothrombin Time 14.6 11.6 - 15.2 seconds   INR 1.13 0.00 - 1.49  Urinalysis, Routine w reflex microscopic     Status: Abnormal   Collection Time: 07/07/14 12:07 AM  Result Value Ref Range   Color, Urine AMBER (A) YELLOW   APPearance HAZY (A) CLEAR   Specific Gravity, Urine 1.026 1.005 - 1.030   pH 5.5 5.0 - 8.0   Glucose, UA NEGATIVE NEGATIVE mg/dL   Hgb urine dipstick NEGATIVE NEGATIVE   Bilirubin Urine SMALL (A) NEGATIVE   Ketones, ur NEGATIVE NEGATIVE mg/dL   Protein, ur NEGATIVE NEGATIVE mg/dL   Urobilinogen, UA 1.0 0.0 - 1.0 mg/dL   Nitrite NEGATIVE NEGATIVE   Leukocytes, UA NEGATIVE NEGATIVE  Lipid panel     Status: Abnormal   Collection Time: 07/07/14  4:02 AM  Result Value Ref Range   Cholesterol 182 0 - 200 mg/dL   Triglycerides 116 <150 mg/dL   HDL 24 (L) >39 mg/dL   Total CHOL/HDL Ratio 7.6 RATIO   VLDL 23 0 - 40 mg/dL   LDL Cholesterol 135 (H) 0 - 99 mg/dL   Dg Chest 2 View  07/06/2014   CLINICAL DATA:  Increasing left-sided weakness. Previous stroke.  Smoker.  EXAM: CHEST  2 VIEW  COMPARISON:  10/31/2008.  FINDINGS: Normal sized heart. Clear lungs. Increased density overlying the posterior aspect of the lower thoracic spine.  IMPRESSION: Possible mass overlying the posterior aspect of the lower thoracic spine. A chest  CT with contrast is recommended.   Electronically Signed   By: Enrique Sack M.D.   On: 07/06/2014 15:54   Ct Head Wo Contrast  07/06/2014   CLINICAL DATA:  Worsened left side weakness. The patient was recently diagnosed with stroke at an outside facility.  EXAM: CT HEAD WITHOUT CONTRAST  TECHNIQUE: Contiguous axial images were obtained from the base of the skull through the vertex without intravenous contrast.  COMPARISON:  Head CT scan and brain MRI 10/31/2008.  FINDINGS: Hypoattenuation in the anterior right MCA territory correlates with recent diagnosis of infarct. Scattered areas of hypoattenuation are also seen in the deep white matter with lacunar infarction which appears remote in the right internal capsule identified. No evidence of acute infarction, hemorrhage, mass lesion, mass effect, midline shift or abnormal extra-axial fluid collection is identified. There is no hydrocephalus or pneumocephalus. The calvarium is intact.  IMPRESSION: No acute finding.  Findings consistent with subacute to remote right MCA territory infarct. Chronic microvascular ischemic change also noted.   Electronically Signed   By: Inge Rise M.D.   On: 07/06/2014 14:57   Ct Angio Neck W/cm &/or Wo/cm  07/07/2014   CLINICAL DATA:  54 year old male with right hemispheric infarcts. Subsequent encounter.  EXAM: CT ANGIOGRAPHY NECK  TECHNIQUE: Multidetector CT imaging of the neck was performed using the standard protocol during bolus administration of intravenous contrast. Multiplanar CT image reconstructions and MIPs were obtained to evaluate the vascular anatomy. Carotid stenosis measurements (when applicable) are obtained utilizing NASCET criteria, using the  distal internal carotid diameter as the denominator.  CONTRAST:  50 cc Omnipaque 350.  COMPARISON:  07/06/2014 brain MR. 11/03/2008 catheter angiogram.  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 Recommend annual imaging followup by CTA or MRA. This recommendation follows 2010 ACCF/AHA/AATS/ACR/ASA/SCA/SCAI/SIR/STS/SVM Guidelines for the Diagnosis and Management of Patients With Thoracic Aortic Disease. Circulation.  2010; 121: A128-N867  Right carotid system: Right internal carotid artery is occluded 1 cm above its origin. Etiology of occlusion is indeterminate.  Left carotid system: Focal plaque proximal left internal carotid artery without measurable/ significant stenosis.  Vertebral arteries:Mild narrowing proximal right vertebral artery.  Other: Calcified mediastinal lymph nodes suggestive of prior granulomatous exposure. Small thyroid lesions more notable on the left measuring up to 1.2 cm. Dental disease. Subtle low-density structure within the left palatine tonsil may represent result of prior inflammation without discrete mass identified. Cervical spondylotic changes with spinal stenosis most notable C3-4 thru C5-6 level.  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 Etiology indeterminate.  Focal plaque proximal left internal carotid artery without measurable/significant stenosis.  Mild narrowing proximal right vertebral artery.  Slightly ectatic ascending thoracic aorta (3.4 cm) with follow-up as noted above.  Surrounding incidental findings as detailed above.  These results will be called to the ordering clinician or representative by the Radiologist Assistant, and communication documented in the PACS or zVision Dashboard.   Electronically Signed   By: Chauncey Cruel M.D.   On: 07/07/2014 09:00   Mr Brain Wo Contrast  07/06/2014   CLINICAL DATA:  Progressive LEFT-sided weakness after stroke in late December (acute RIGHT basal ganglia  infarct). History of transient ischemic attacks, and possible RIGHT internal carotid artery stenosis.  EXAM: MRI HEAD WITHOUT CONTRAST  MRA HEAD WITHOUT CONTRAST  TECHNIQUE: Multiplanar, multiecho pulse sequences of the brain and surrounding structures were obtained without intravenous contrast. Angiographic images of the head were obtained using MRA technique without contrast.  COMPARISON:  CT of the head July 06, 2014 and MRI of the head Oct 31, 2008  FINDINGS: MRI HEAD FINDINGS  Patchy areas of reduced diffusion within the RIGHT frontal, anterior occipital lobes, as well as the subcentimeter foci of reduced diffusion within the RIGHT basal ganglia, 14 mm focus of reduced diffusion within the RIGHT globus pallidus. Reduced diffusion within all lesions consistent with synchronous ischemia. Blood sensitive sequences extremely motion degraded, limiting sensitivity for petechial hemorrhage, no lobar hematoma.  Areas of acute ischemia demonstrate bright FLAIR signal, superimposed scattered subcentimeter FLAIR T2 hyperintensities within the supratentorial brain. No midline shift. The ventricles and sulci are overall normal for patient's age.  No abnormal extra-axial fluid collections. Complete loss of the RIGHT internal carotid artery flow void, see below.  Mild paranasal sinus mucosal thickening without air-fluid levels. Ocular globes and orbital contents are unremarkable. Mastoid air cells are well aerated. No suspicious calvarial bone marrow signal. No abnormal sellar expansion. No cerebellar tonsillar ectopia.  MRA HEAD FINDINGS  Anterior circulation: Thready flow related enhancement within RIGHT cervical internal carotid artery, minimal flow related enhancement of RIGHT petrous, complete loss of the RIGHT cavernous and supra clinoid internal carotid artery flow related enhancement. LEFT cervical, petrous, cavernous and supra clinoid internal carotid artery demonstrates robust flow related enhancement.  Apparent  retrograde flow related enhancement of the RIGHT carotid terminus. Tiny anterior communicating artery is likely present. Generalized diminutive flow related enhancement RIGHT middle cerebral artery, normal flow related enhancement of the anterior cerebral arteries and LEFT middle cerebral artery.  No aneurysm.  No large vessel occlusion, hemodynamically significant stenosis, abnormal luminal irregularity, aneurysm.  Posterior circulation: Codominant vertebral arteries. Basilar artery is patent, with normal flow related enhancement of the main branch vessels. Normal flow related enhancement of the posterior cerebral arteries. Tiny bilateral posterior communicating arteries are present.  No large vessel occlusion, hemodynamically significant stenosis, abnormal luminal irregularity, aneurysm.  IMPRESSION: MRI head: Acute multifocal infarcts within RIGHT middle cerebral artery territory (including RIGHT basal ganglia). RIGHT anterior occipital likely within RIGHT middle cerebral artery territory/watershed. Limited blood sensitive sequence without lobar hematoma.  Mild white matter changes, independent of acute areas of ischemia suggest chronic small vessel ischemic disease.  MRA head: RIGHT internal carotid artery occlusion. Bilateral posterior communicating arteries and suspected tiny anterior communicating artery present. Diminutive RIGHT middle cerebral artery flow related enhancement. Recommend CT angiogram of the neck for further evaluation.  These results will be called to the ordering clinician or representative by the Radiologist Assistant, and communication documented in the PACS or zVision Dashboard.   Electronically Signed   By: Elon Alas   On: 07/06/2014 21:41   Mr Jodene Nam Head/brain Wo Cm  07/06/2014   CLINICAL DATA:  Progressive LEFT-sided weakness after stroke in late December (acute RIGHT basal ganglia infarct). History of transient ischemic attacks, and possible RIGHT internal carotid artery  stenosis.  EXAM: MRI HEAD WITHOUT CONTRAST  MRA HEAD WITHOUT CONTRAST  TECHNIQUE: Multiplanar, multiecho pulse sequences of the brain and surrounding structures were obtained without intravenous contrast. Angiographic images of the head were obtained using MRA technique without contrast.  COMPARISON:  CT of the head July 06, 2014 and MRI of the head Oct 31, 2008  FINDINGS: MRI HEAD FINDINGS  Patchy areas of reduced diffusion within the RIGHT frontal, anterior occipital lobes, as well as the subcentimeter foci of reduced diffusion within the RIGHT basal ganglia, 14 mm focus of reduced diffusion within the RIGHT globus pallidus. Reduced diffusion within all  lesions consistent with synchronous ischemia. Blood sensitive sequences extremely motion degraded, limiting sensitivity for petechial hemorrhage, no lobar hematoma.  Areas of acute ischemia demonstrate bright FLAIR signal, superimposed scattered subcentimeter FLAIR T2 hyperintensities within the supratentorial brain. No midline shift. The ventricles and sulci are overall normal for patient's age.  No abnormal extra-axial fluid collections. Complete loss of the RIGHT internal carotid artery flow void, see below.  Mild paranasal sinus mucosal thickening without air-fluid levels. Ocular globes and orbital contents are unremarkable. Mastoid air cells are well aerated. No suspicious calvarial bone marrow signal. No abnormal sellar expansion. No cerebellar tonsillar ectopia.  MRA HEAD FINDINGS  Anterior circulation: Thready flow related enhancement within RIGHT cervical internal carotid artery, minimal flow related enhancement of RIGHT petrous, complete loss of the RIGHT cavernous and supra clinoid internal carotid artery flow related enhancement. LEFT cervical, petrous, cavernous and supra clinoid internal carotid artery demonstrates robust flow related enhancement.  Apparent retrograde flow related enhancement of the RIGHT carotid terminus. Tiny anterior communicating  artery is likely present. Generalized diminutive flow related enhancement RIGHT middle cerebral artery, normal flow related enhancement of the anterior cerebral arteries and LEFT middle cerebral artery.  No aneurysm.  No large vessel occlusion, hemodynamically significant stenosis, abnormal luminal irregularity, aneurysm.  Posterior circulation: Codominant vertebral arteries. Basilar artery is patent, with normal flow related enhancement of the main branch vessels. Normal flow related enhancement of the posterior cerebral arteries. Tiny bilateral posterior communicating arteries are present.  No large vessel occlusion, hemodynamically significant stenosis, abnormal luminal irregularity, aneurysm.  IMPRESSION: MRI head: Acute multifocal infarcts within RIGHT middle cerebral artery territory (including RIGHT basal ganglia). RIGHT anterior occipital likely within RIGHT middle cerebral artery territory/watershed. Limited blood sensitive sequence without lobar hematoma.  Mild white matter changes, independent of acute areas of ischemia suggest chronic small vessel ischemic disease.  MRA head: RIGHT internal carotid artery occlusion. Bilateral posterior communicating arteries and suspected tiny anterior communicating artery present. Diminutive RIGHT middle cerebral artery flow related enhancement. Recommend CT angiogram of the neck for further evaluation.  These results will be called to the ordering clinician or representative by the Radiologist Assistant, and communication documented in the PACS or zVision Dashboard.   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 Does the need for close, 24 hr/day medical supervision in concert with the patient's rehab needs make it unreasonable for this patient to 2.  be served in a less intensive setting? Yes 3. Co-Morbidities  requiring supervision/potential complications: post-stroke sequelae 4. Due to bladder management, bowel management, safety, skin/wound care, disease management, medication administration, pain management and patient education, does the patient require 24 hr/day rehab nursing? Yes 5. Does the patient require coordinated care of a physician, rehab nurse, PT (1-2 hrs/day, 5 days/week), OT (1-2 hrs/day, 5 days/week) and SLP (1-2 hrs/day, 5 days/week) to address physical and functional deficits in the context of the above medical diagnosis(es)? Yes Addressing deficits in the following areas: balance, endurance, locomotion, strength, transferring, bowel/bladder control, bathing, dressing, feeding, grooming, toileting, cognition, speech, language, swallowing and psychosocial support 6. Can the patient actively participate in an intensive therapy program of at least 3 hrs of therapy per day at least 5 days per week? Yes 7. The potential for patient to make measurable gains while on inpatient rehab is excellent 8. Anticipated functional outcomes upon discharge from inpatient rehab are supervision  with PT, supervision with OT, supervision with SLP. 9. Estimated rehab length of stay to reach the above functional goals is: 16-23 days 10. Does the patient have adequate social supports and living environment to accommodate these discharge functional goals? Yes 11. Anticipated D/C setting: Home 12. Anticipated post D/C treatments: [place] therapy 13. Overall Rehab/Functional Prognosis: excellent  RECOMMENDATIONS: This patient's condition is appropriate for continued rehabilitative care in the following setting: CIR Patient has agreed to participate in recommended program. Yes Note that insurance prior authorization may be required for reimbursement for recommended care.  Comment: Rehab Admissions Coordinator to follow up.  Thanks,  Meredith Staggers, MD, Mellody Drown     07/07/2014

## 2014-07-07 NOTE — Progress Notes (Signed)
Rehab Admissions Coordinator Note:  Patient was screened by Naseer Hearn L for appropriateness for an Inpatient Acute Rehab Consult.  At this time, we are recommending Inpatient Rehab consult.  Divante Kotch L 07/07/2014, 9:51 AM  I can be reached at [phone].

## 2014-07-07 NOTE — Progress Notes (Signed)
Rehab admissions - I met with pt and his wife/family in follow up to rehab MD consult to explain the possibility of inpatient rehab. They are interested in pursuing inpatient rehab and motivated for pt to gain his independence back. Further questions were answered and informational brochures were given.   Pt has [employer] [place] and wife shared that pt's Rn case manager is Caren Griffins Job of [employer] [place] (phone: [phone]). I called and spoke with Caren Griffins and updated her on pt's status. We will open his case with [employer] [place] and wait for insurance response.  We will consider possible inpatient rehab pending pt's medical clearance, insurance authorization and our bed availability.  I will check on pt's status tomorrow and keep the medical team aware of any updates. Please call me with any questions. Thanks.  Nanetta Batty, PT Rehabilitation Admissions Coordinator [phone]

## 2014-07-07 NOTE — Evaluation (Signed)
Occupational Therapy Evaluation Patient Details Name: Samuel Shaw MRN: [id number] DOB: 22-Jan-1961 Today's Date: 07/07/2014    History of Present Illness Pt is a 54 yo male who was admitted after being d/c'd from hospital in [place] in late Dec with R MCA CVA.  Pt with L sided weakness that has progressed since dc from other hospital.  Pt found to have R basal ganglia infart as well.  Pt has had TIAs in past but has not had a physical in over 20 years.   Clinical Impression   Pt admitted with the above diagnosis and has the deficits listed below.  Pt would benefit from cont acute OT to increase I with all adls, use of LUE and awareness of L side and environment.  Pt would also benefit from acute rehab prior to returning home so he can quickly maximize his I with all adls.    Follow Up Recommendations  CIR;Supervision/Assistance - 24 hour    Equipment Recommendations  None recommended by OT (wife had hip replacement and has some equipment)    Recommendations for Other Services Rehab consult     Precautions / Restrictions Precautions Precautions: Fall Restrictions Weight Bearing Restrictions: No      Mobility Bed Mobility Overal bed mobility: Needs Assistance Bed Mobility: Supine to Sit     Supine to sit: Mod assist     General bed mobility comments: Bed was flat.  Pt gets started but needed tactile cues to continue coming to side of bed.    Transfers Overall transfer level: Needs assistance Equipment used: 1 person hand held assist Transfers: Stand Pivot Transfers;Sit to/from Stand Sit to Stand: Mod assist Stand pivot transfers: Max assist       General transfer comment: Pt was max assist to transfer to weak side and mod to transfer to strong side.    Balance Overall balance assessment: Needs assistance Sitting-balance support: Bilateral upper extremity supported;Feet supported Sitting balance-Leahy Scale: Poor Sitting balance - Comments: Pt with L  lean Postural control: Left lateral lean Standing balance support: Bilateral upper extremity supported;During functional activity Standing balance-Leahy Scale: Zero Standing balance comment: Pt with L lean and very unsteady on his feet.                            ADL Overall ADL's : Needs assistance/impaired Eating/Feeding: NPO   Grooming: Wash/dry hands;Wash/dry face;Oral care;Minimal assistance;Sitting Grooming Details (indicate cue type and reason): Pt requires cues to attempt to use L hand and VCs to problem solve how to do things diffferently with decreased use of L side.   Upper Body Bathing: Moderate assistance;Sitting Upper Body Bathing Details (indicate cue type and reason): assist with hand over hand using L side to wash R. Lower Body Bathing: Maximal assistance;Sit to/from stand Lower Body Bathing Details (indicate cue type and reason): assist in standing. Pt with very poor balance.  Cues to cross L leg over R to attempt to reach foot. Upper Body Dressing : Maximal assistance;Sitting Upper Body Dressing Details (indicate cue type and reason): educated on hemi dressing technique. Lower Body Dressing: Maximal assistance;Sit to/from stand Lower Body Dressing Details (indicate cue type and reason): poor standing balance and safety. Toilet Transfer: Moderate assistance;Stand-pivot;BSC   Toileting- Clothing Manipulation and Hygiene: Maximal assistance;Sit to/from stand Toileting - Clothing Manipulation Details (indicate cue type and reason): Pt requires max assist +1 for toileting and cleaning self.     Functional mobility during ADLs: Maximal assistance  General ADL Comments: Pt requires great assist for adls due to L side weakness and decreased safety awareness.     Vision                 Additional Comments: Pt demonstrates some L body neglect.   Perception Perception Perception Tested?: Yes Perception Deficits: Inattention/neglect Inattention/Neglect:  Does not attend to left side of body   Praxis Praxis Praxis tested?: Not tested    Pertinent Vitals/Pain Pain Assessment: No/denies pain     Hand Dominance Right   Extremity/Trunk Assessment Upper Extremity Assessment Upper Extremity Assessment: LUE deficits/detail LUE Deficits / Details: Pt with PROM WFL.  Strength  Shoulder 2/5, biceps 4/5, triceps 4/5, wrist and hand 3-/5.   LUE Coordination: decreased fine motor;decreased gross motor   Lower Extremity Assessment Lower Extremity Assessment: Defer to PT evaluation   Cervical / Trunk Assessment Cervical / Trunk Assessment: Normal   Communication Communication Communication: No difficulties   Cognition Arousal/Alertness: Awake/alert Behavior During Therapy: Flat affect Overall Cognitive Status: Impaired/Different from baseline Area of Impairment: Safety/judgement;Problem solving         Safety/Judgement: Decreased awareness of safety;Decreased awareness of deficits   Problem Solving: Slow processing;Difficulty sequencing;Requires verbal cues General Comments: Pt answered all questions correctly.  Very flat affect; question depression.   General Comments       Exercises       Shoulder Instructions      Home Living Family/patient expects to be discharged to:: Inpatient rehab Living Arrangements: Spouse/significant other                                      Prior Functioning/Environment Level of Independence: Independent        Comments: prior to dec pt was independent and worked in [employer].    OT Diagnosis: Generalized weakness;Cognitive deficits;Hemiplegia non-dominant side   OT Problem List: Decreased strength;Decreased range of motion;Decreased activity tolerance;Impaired balance (sitting and/or standing);Impaired vision/perception;Decreased coordination;Decreased cognition;Decreased safety awareness;Decreased knowledge of use of DME or AE;Decreased knowledge of precautions;Impaired  UE functional use   OT Treatment/Interventions: Self-care/ADL training;Neuromuscular education;DME and/or AE instruction;Therapeutic activities    OT Goals(Current goals can be found in the care plan section) Acute Rehab OT Goals Patient Stated Goal: to be well. OT Goal Formulation: With patient/family Time For Goal Achievement: 07/21/14 Potential to Achieve Goals: Good ADL Goals Pt Will Perform Eating: with set-up;sitting Pt Will Perform Grooming: with set-up;sitting Pt Will Perform Upper Body Bathing: with min assist;sitting Pt Will Perform Upper Body Dressing: with min assist;sitting Pt Will Transfer to Toilet: with min assist;ambulating;bedside commode Pt/caregiver will Perform Home Exercise Program: Left upper extremity;Increased strength;With minimal assist;With written HEP provided Additional ADL Goal #1: pt will use LUE during functiona tasks with minimal VCs Additional ADL Goal #2: Pt will tend to L arm during all adls and funtional mobility with min cues.  OT Frequency: Min 3X/week   Barriers to D/C: Decreased caregiver support  Wife works nights but she said someone could stay with him at night.       Co-evaluation              End of Session Nurse Communication: Mobility status  Activity Tolerance: Patient tolerated treatment well Patient left: in chair;with chair alarm set;with call bell/phone within reach;with family/visitor present   Time: 0240-9735 OT Time Calculation (min): 35 min Charges:  OT General Charges $OT Visit: 1 Procedure OT Evaluation $  Initial OT Evaluation Tier I: 1 Procedure OT Treatments $Self Care/Home Management : 23-37 mins G-Codes:    Glenford Peers 01-Aug-2014, 9:47 AM  [phone]

## 2014-07-07 NOTE — Progress Notes (Signed)
PT Cancellation Note  Patient Details Name: Samuel Shaw MRN: [id number] DOB: 1961/04/28   Cancelled Treatment:    Reason Eval/Treat Not Completed: Patient at procedure or test/unavailable. PT orders received but pt off unit for CT scan. Will follow up for evaluation as able.    Canary Brim [facility] 07/07/2014, 8:16 AM

## 2014-07-08 ENCOUNTER — Inpatient Hospital Stay (HOSPITAL_COMMUNITY)
Admission: RE | Admit: 2014-07-08 | Discharge: 2014-07-29 | DRG: 057 | Disposition: A | Discharge status: Home / Self Care | Payer: BLUE CROSS/BLUE SHIELD | Source: Intra-hospital | Attending: Physical Medicine & Rehabilitation | Admitting: Physical Medicine & Rehabilitation

## 2014-07-08 DIAGNOSIS — R414 Neurologic neglect syndrome: Secondary | ICD-10-CM | POA: Diagnosis not present

## 2014-07-08 DIAGNOSIS — L84 Corns and callosities: Secondary | ICD-10-CM | POA: Diagnosis present

## 2014-07-08 DIAGNOSIS — F17210 Nicotine dependence, cigarettes, uncomplicated: Secondary | ICD-10-CM | POA: Diagnosis present

## 2014-07-08 DIAGNOSIS — I63511 Cerebral infarction due to unspecified occlusion or stenosis of right middle cerebral artery: Secondary | ICD-10-CM | POA: Diagnosis present

## 2014-07-08 DIAGNOSIS — I69328 Other speech and language deficits following cerebral infarction: Secondary | ICD-10-CM | POA: Diagnosis not present

## 2014-07-08 DIAGNOSIS — I69319 Unspecified symptoms and signs involving cognitive functions following cerebral infarction: Secondary | ICD-10-CM

## 2014-07-08 DIAGNOSIS — R001 Bradycardia, unspecified: Secondary | ICD-10-CM | POA: Diagnosis present

## 2014-07-08 DIAGNOSIS — I69392 Facial weakness following cerebral infarction: Secondary | ICD-10-CM | POA: Diagnosis not present

## 2014-07-08 DIAGNOSIS — I69354 Hemiplegia and hemiparesis following cerebral infarction affecting left non-dominant side: Secondary | ICD-10-CM | POA: Diagnosis present

## 2014-07-08 DIAGNOSIS — R41842 Visuospatial deficit: Secondary | ICD-10-CM | POA: Diagnosis present

## 2014-07-08 DIAGNOSIS — E785 Hyperlipidemia, unspecified: Secondary | ICD-10-CM | POA: Diagnosis present

## 2014-07-08 DIAGNOSIS — G8110 Spastic hemiplegia affecting unspecified side: Secondary | ICD-10-CM | POA: Diagnosis present

## 2014-07-08 DIAGNOSIS — I6789 Other cerebrovascular disease: Secondary | ICD-10-CM

## 2014-07-08 DIAGNOSIS — I69398 Other sequelae of cerebral infarction: Secondary | ICD-10-CM

## 2014-07-08 DIAGNOSIS — I6931 Cognitive deficits following cerebral infarction: Secondary | ICD-10-CM | POA: Diagnosis not present

## 2014-07-08 LAB — CREATININE, SERUM
CREATININE: 1.11 mg/dL (ref 0.50–1.35)
GFR calc non Af Amer: 74 mL/min — ABNORMAL LOW (ref >90)
GFR, EST AFRICAN AMERICAN: 86 mL/min — AB (ref >90)

## 2014-07-08 LAB — CBC
HCT: 44.4 % (ref 39.0–52.0)
Hemoglobin: 15.8 g/dL (ref 13.0–17.0)
MCH: 32 pg (ref 26.0–34.0)
MCHC: 35.6 g/dL (ref 30.0–36.0)
MCV: 89.9 fL (ref 78.0–100.0)
PLATELETS: 197 10*3/uL (ref 150–400)
RBC: 4.94 MIL/uL (ref 4.22–5.81)
RDW: 12.9 % (ref 11.5–15.5)
WBC: 5.7 10*3/uL (ref 4.0–10.5)

## 2014-07-08 MED ORDER — PNEUMOCOCCAL VAC POLYVALENT 25 MCG/0.5ML IJ INJ
0.5000 mL | INJECTION | INTRAMUSCULAR | Status: AC
  Administered 2014-07-09: 0.5 mL via INTRAMUSCULAR
  Filled 2014-07-08: qty 0.5

## 2014-07-08 MED ORDER — MAGNESIUM CITRATE PO SOLN
1.0000 | Freq: Once | ORAL | Status: DC
  Filled 2014-07-08: qty 296

## 2014-07-08 MED ORDER — HEPARIN SODIUM (PORCINE) 5000 UNIT/ML IJ SOLN: 5000.0000 [IU] | Freq: Three times a day (TID) | INTRAMUSCULAR | Status: DC

## 2014-07-08 MED ORDER — ACETAMINOPHEN 325 MG PO TABS: 650.0000 mg | ORAL_TABLET | Freq: Four times a day (QID) | ORAL | Status: AC | PRN

## 2014-07-08 MED ORDER — ONDANSETRON HCL 4 MG PO TABS: 4.0000 mg | ORAL_TABLET | Freq: Four times a day (QID) | ORAL | Status: DC | PRN

## 2014-07-08 MED ORDER — NICOTINE 21 MG/24HR TD PT24
21.0000 mg | MEDICATED_PATCH | Freq: Every day | TRANSDERMAL | Status: DC
  Administered 2014-07-09 – 2014-07-27 (×19): 21 mg via TRANSDERMAL
  Filled 2014-07-08 (×21): qty 1

## 2014-07-08 MED ORDER — ATORVASTATIN CALCIUM 20 MG PO TABS
20.0000 mg | ORAL_TABLET | Freq: Every day | ORAL | Status: DC
  Administered 2014-07-08 – 2014-07-28 (×21): 20 mg via ORAL
  Filled 2014-07-08 (×22): qty 1

## 2014-07-08 MED ORDER — ATORVASTATIN CALCIUM 20 MG PO TABS: 20.0000 mg | ORAL_TABLET | Freq: Every day | ORAL | Status: DC

## 2014-07-08 MED ORDER — HEPARIN SODIUM (PORCINE) 5000 UNIT/ML IJ SOLN
5000.0000 [IU] | Freq: Three times a day (TID) | INTRAMUSCULAR | Status: DC
  Administered 2014-07-08 – 2014-07-29 (×62): 5000 [IU] via SUBCUTANEOUS
  Filled 2014-07-08 (×66): qty 1

## 2014-07-08 MED ORDER — FLEET ENEMA 7-19 GM/118ML RE ENEM
1.0000 | ENEMA | Freq: Once | RECTAL | Status: DC
  Filled 2014-07-08: qty 1

## 2014-07-08 MED ORDER — ONDANSETRON HCL 4 MG/2ML IJ SOLN: 4.0000 mg | Freq: Four times a day (QID) | INTRAMUSCULAR | Status: DC | PRN

## 2014-07-08 MED ORDER — ASPIRIN EC 325 MG PO TBEC
325.0000 mg | DELAYED_RELEASE_TABLET | Freq: Every day | ORAL | Status: DC
  Administered 2014-07-09 – 2014-07-29 (×21): 325 mg via ORAL
  Filled 2014-07-08 (×23): qty 1

## 2014-07-08 MED ORDER — TRAZODONE HCL 50 MG PO TABS
50.0000 mg | ORAL_TABLET | Freq: Every evening | ORAL | Status: DC | PRN
  Administered 2014-07-28: 50 mg via ORAL
  Filled 2014-07-08: qty 1

## 2014-07-08 MED ORDER — INFLUENZA VAC SPLIT QUAD 0.5 ML IM SUSY
0.5000 mL | PREFILLED_SYRINGE | INTRAMUSCULAR | Status: AC
  Administered 2014-07-09: 0.5 mL via INTRAMUSCULAR
  Filled 2014-07-08: qty 0.5

## 2014-07-08 MED ORDER — DSS 100 MG PO CAPS: 100.0000 mg | ORAL_CAPSULE | Freq: Two times a day (BID) | ORAL | Status: DC

## 2014-07-08 MED ORDER — DOCUSATE SODIUM 100 MG PO CAPS
100.0000 mg | ORAL_CAPSULE | Freq: Two times a day (BID) | ORAL | Status: DC
  Administered 2014-07-08 – 2014-07-10 (×4): 100 mg via ORAL
  Filled 2014-07-08 (×6): qty 1

## 2014-07-08 MED ORDER — SORBITOL 70 % SOLN
30.0000 mL | Freq: Every day | Status: DC | PRN
  Administered 2014-07-09: 15 mL via ORAL
  Filled 2014-07-08 (×2): qty 30

## 2014-07-08 MED ORDER — ACETAMINOPHEN 325 MG PO TABS: 650.0000 mg | ORAL_TABLET | Freq: Four times a day (QID) | ORAL | Status: DC | PRN

## 2014-07-08 MED ORDER — STROKE: EARLY STAGES OF RECOVERY BOOK: Status: DC

## 2014-07-08 MED ORDER — ACETAMINOPHEN 325 MG PO TABS
650.0000 mg | ORAL_TABLET | Freq: Four times a day (QID) | ORAL | Status: DC | PRN
  Administered 2014-07-08: 650 mg via ORAL
  Filled 2014-07-08: qty 2

## 2014-07-08 NOTE — Progress Notes (Signed)
Speech Language Pathology Treatment: Dysphagia;Cognitive-Linquistic  Patient Details Name: Samuel Shaw MRN: [id number] DOB: April 10, 1961 Today's Date: 07/08/2014 Time: 7106-2694 SLP Time Calculation (min) (ACUTE ONLY): 15 min  Assessment / Plan / Recommendation Clinical Impression  Pt with mild improvements noted with left-sided buccal pocketing during PO trials today. Immediate cough noted x1, and Min cues were provided by SLP for use of lingual sweep. Pt attended to the left side of his tray without cueing from therapist today. He demonstrated basic problem solving with extra time, although with decreased emergent awareness of difficulties with task and decreased mental flexibility to attempt additional problem solving methods. SLP provided Mod cues for sustained attention to task.   HPI HPI: Pt is a 53 yo male who was admitted after being d/c'd from hospital in [place] in late Dec with R MCA CVA. Pt with L sided weakness that has progressed since dc from other hospital. Pt found to have R basal ganglia infart as well. Pt has had TIAs in past but has not had a physical in over 20 years.   Pertinent Vitals Pain Assessment: No/denies pain  SLP Plan  Continue with current plan of care    Recommendations Diet recommendations: Dysphagia 3 (mechanical soft);Thin liquid Liquids provided via: Cup Medication Administration: Whole meds with puree Supervision: Patient able to self feed;Full supervision/cueing for compensatory strategies Compensations: Slow rate;Small sips/bites (lingual sweep) Postural Changes and/or Swallow Maneuvers: Seated upright 90 degrees       Oral Care Recommendations: Oral care BID Follow up Recommendations: Inpatient Rehab;24 hour supervision/assistance Plan: Continue with current plan of care    GO     Samuel Shaw, M.A. CCC-SLP [phone]  Samuel Shaw 07/08/2014, 11:22 AM

## 2014-07-08 NOTE — Progress Notes (Signed)
Physical Therapy Treatment Patient Details Name: Samuel Shaw MRN: [id number] DOB: 1960/08/04 Today's Date: 07/08/2014    History of Present Illness Pt is a 54 yo male who was admitted after being d/c'd from hospital in [place] in late Dec with R MCA CVA.  Pt with L sided weakness that has progressed since dc from other hospital.  Pt found to have R basal ganglia infart as well.  Pt has had TIAs in past but has not had a physical in over 20 years.    PT Comments    Patient progressing well and able to work on some ambulation and weight shifting with use of rail out in hallway. Patient with flat affect but willing to participate as requesting. Patient interesting and hopeful for CIR. Continue to recommend comprehensive inpatient rehab (CIR) for post-acute therapy needs.   Follow Up Recommendations  CIR;Supervision/Assistance - 24 hour     Equipment Recommendations       Recommendations for Other Services       Precautions / Restrictions Precautions Precautions: Fall Restrictions Weight Bearing Restrictions: No    Mobility  Bed Mobility Overal bed mobility: Needs Assistance Bed Mobility: Sit to Supine     Supine to sit: Mod assist     General bed mobility comments: A for Les and trunk support into sitting. Cues for positioning  Transfers Overall transfer level: Needs assistance Equipment used: 1 person hand held assist   Sit to Stand: Mod assist         General transfer comment: Cues for technique and hand/foot placement. "bear hug" technique utilized for standing to enable knee blocking.   Ambulation/Gait Ambulation/Gait assistance: +2 physical assistance;Mod assist;Max assist Ambulation Distance (Feet): 10 Feet (x3) Assistive device: 2 person hand held assist Gait Pattern/deviations: Step-to pattern;Decreased dorsiflexion - right;Decreased dorsiflexion - left;Decreased weight shift to right;Narrow base of support;Trunk flexed   Gait velocity interpretation:  Below normal speed for age/gender General Gait Details: +2 asssit to weight shift and advance BLE. Very increased knee flexion/buckling on L LE. Use of rail in hallway on the R for ambulatoin. Cues to attempt to keep upright posture and weightshifting.   Stairs            Wheelchair Mobility    Modified Rankin (Stroke Patients Only) Modified Rankin (Stroke Patients Only) Pre-Morbid Rankin Score: No symptoms Modified Rankin: Moderately severe disability     Balance                                    Cognition Arousal/Alertness: Awake/alert Behavior During Therapy: Flat affect Overall Cognitive Status: Impaired/Different from baseline Area of Impairment: Awareness;Safety/judgement;Attention;Memory;Problem solving   Current Attention Level: Sustained     Safety/Judgement: Decreased awareness of safety;Decreased awareness of deficits   Problem Solving: Difficulty sequencing;Requires verbal cues;Requires tactile cues General Comments: Slightly impulsive with inability to recall deficits.     Exercises      General Comments        Pertinent Vitals/Pain Pain Assessment: No/denies pain    Home Living                      Prior Function            PT Goals (current goals can now be found in the care plan section) Progress towards PT goals: Progressing toward goals    Frequency  Min 4X/week    PT Plan Current  plan remains appropriate    Co-evaluation             End of Session Equipment Utilized During Treatment: Gait belt Activity Tolerance: Patient tolerated treatment well Patient left: in chair;with call bell/phone within reach;with chair alarm set;with family/visitor present     Time: 2979-8921 PT Time Calculation (min) (ACUTE ONLY): 28 min  Charges:  $Gait Training: 8-22 mins $Therapeutic Activity: 8-22 mins                    G Codes:      Jacqualyn Posey 07/08/2014, 9:32 AM 07/08/2014 Jacqualyn Posey PTA [phone] pager [phone] office

## 2014-07-08 NOTE — Progress Notes (Signed)
  Echocardiogram 2D Echocardiogram has been performed.  Samuel Shaw FRANCES 07/08/2014, 11:55 AM

## 2014-07-08 NOTE — Discharge Summary (Signed)
Samuel Shaw, 54 y.o., DOB 04/01/1961, MRN [id number]. Admission date: 07/06/2014 Discharge Date 07/08/2014 Primary MD Tula Nakayama, MD Admitting Physician Phillips Climes, MD  Admission Diagnosis  Tobacco abuse [Z72.0] Chest mass [R22.2] Weakness [R53.1] CVA (cerebral vascular accident) [I63.9] Dysphagia [R13.10]  Discharge Diagnosis   Active Problems:   CVA (cerebral infarction)   CVA (cerebral vascular accident)   Tobacco abuse   HLD (hyperlipidemia)    PCP please follow: - Recheck CBC, BMP, chest x-ray during your next visit. - Patient had incidental finding on CT chest with contrast showing mild aneurysmal dilatation of thoracic aorta which will need yearly monitoring.  Past Medical History  Diagnosis Date  . Stroke     History reviewed. No pertinent past surgical history.   Hospital Course See H&P, Labs, Consult and Test reports for all details in brief, patient was admitted for  Active Problems:   CVA (cerebral infarction)   CVA (cerebral vascular accident)   Tobacco abuse   HLD (hyperlipidemia)  Samuel Shaw is an 54 y.o. male PMH of 2 TIAs 2 years ago, left carotid blockage, and known CVA in late December 2015 with recent discharge from hospital in [place] ,presenting with progressive left-sided weakness since that discharge. These symptoms are similar to his stroke in December, the family just feels they are getting worse. His wife also feels his speech is getting progressively harder to understand.  MRI/MRA showing acute multifocal infarcts in the right middle cerebral artery territory and left occipital right ICA occlusion, seen by neurology felt most likely due to atherosclerosis given history of smoking.  CVA: Neurology consult appreciated, as per neuro right MCA scattered infarction from recent stroke in [place] (no new stroke this admission) - artery to artery emboli secondary to R ICA occlusion (large vessel disease). - MRA R ICA occlusion w/  decreased R MCA flow - CT angio R ICA occlusion - HgbA1c 5.6 - DIET DYS 3 thin liquids - aspirin 325 mg orally every day     Hyperlipidemia Home meds: No statin LDL 135, goal < 70 Now on lipitor 20 mg daily Continue statin at discharge  Tobacco abuse Current smoker Smoking cessation counseling provided Nicotine patch provided Pt is willing to quit  Questionable lung mass chest x-ray - CT chest angiogram done, most likely granuloma versus hamartoma.   Ectasia/mild aneurysmal dilatation of the ascending thoracic aorta, measures 3.4 cm - Recheck imaging in one year. Can be done as an outpatient.  Consults  Neurology  Significant Tests:  See full reports for all details    Dg Chest 2 View  07/06/2014   CLINICAL DATA:  Increasing left-sided weakness. Previous stroke. Smoker.  EXAM: CHEST  2 VIEW  COMPARISON:  10/31/2008.  FINDINGS: Normal sized heart. Clear lungs. Increased density overlying the posterior aspect of the lower thoracic spine.  IMPRESSION: Possible mass overlying the posterior aspect of the lower thoracic spine. A chest CT with contrast is recommended.   Electronically Signed   By: Enrique Sack M.D.   On: 07/06/2014 15:54   Ct Head Wo Contrast  07/06/2014   CLINICAL DATA:  Worsened left side weakness. The patient was recently diagnosed with stroke at an outside facility.  EXAM: CT HEAD WITHOUT CONTRAST  TECHNIQUE: Contiguous axial images were obtained from the base of the skull through the vertex without intravenous contrast.  COMPARISON:  Head CT scan and brain MRI 10/31/2008.  FINDINGS: Hypoattenuation in the anterior right MCA territory correlates with recent diagnosis of infarct. Scattered  areas of hypoattenuation are also seen in the deep white matter with lacunar infarction which appears remote in the right internal capsule identified. No evidence of acute infarction, hemorrhage, mass lesion, mass effect, midline shift or abnormal extra-axial fluid collection is  identified. There is no hydrocephalus or pneumocephalus. The calvarium is intact.  IMPRESSION: No acute finding.  Findings consistent with subacute to remote right MCA territory infarct. Chronic microvascular ischemic change also noted.   Electronically Signed   By: Inge Rise M.D.   On: 07/06/2014 14:57   Ct Angio Neck W/cm &/or Wo/cm  07/07/2014   CLINICAL DATA:  54 year old male with right hemispheric infarcts. Subsequent encounter.  EXAM: CT ANGIOGRAPHY NECK  TECHNIQUE: Multidetector CT imaging of the neck was performed using the standard protocol during bolus administration of intravenous contrast. Multiplanar CT image reconstructions and MIPs were obtained to evaluate the vascular anatomy. Carotid stenosis measurements (when applicable) are obtained utilizing NASCET criteria, using the distal internal carotid diameter as the denominator.  CONTRAST:  50 cc Omnipaque 350.  COMPARISON:  07/06/2014 brain MR. 11/03/2008 catheter angiogram.  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 Recommend annual imaging followup by CTA or MRA. This recommendation follows 2010 ACCF/AHA/AATS/ACR/ASA/SCA/SCAI/SIR/STS/SVM Guidelines for the Diagnosis and Management of Patients With Thoracic Aortic Disease. Circulation.  2010; 121: I627-O350  Right carotid system: Right internal carotid artery is occluded 1 cm above its origin. Etiology of occlusion is indeterminate.  Left carotid system: Focal plaque proximal left internal carotid artery without measurable/ significant stenosis.  Vertebral arteries:Mild narrowing proximal right vertebral artery.  Other: Calcified mediastinal lymph nodes suggestive of prior granulomatous exposure. Small thyroid lesions more notable on the left measuring up to 1.2 cm. Dental disease. Subtle low-density structure within the left palatine tonsil may represent result of prior inflammation without discrete mass identified. Cervical spondylotic changes with spinal  stenosis most notable C3-4 thru C5-6 level.  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 Etiology indeterminate.  Focal plaque proximal left internal carotid artery without measurable/significant stenosis.  Mild narrowing proximal right vertebral artery.  Slightly ectatic ascending thoracic aorta (3.4 cm) with follow-up as noted above.  Surrounding incidental findings as detailed above.  These results will be called to the ordering clinician or representative by the Radiologist Assistant, and communication documented in the PACS or zVision Dashboard.   Electronically Signed   By: Chauncey Cruel M.D.   On: 07/07/2014 09:00   Ct Chest W Contrast  07/07/2014   CLINICAL DATA:  Question pulmonary mass on x-ray.  EXAM: CT CHEST WITH CONTRAST  TECHNIQUE: Multidetector CT imaging of the chest was performed during intravenous contrast administration.  CONTRAST:  66mL OMNIPAQUE IOHEXOL 300 MG/ML  SOLN  COMPARISON:  Radiographs 1 day prior.  FINDINGS: Within the posterior right lower lobe there is a 2.3 x 1.8 cm nodule. There is a large amount of internal calcification. No definite macroscopic fat. This abuts the posterior pleura with associated pleural thickening and likely compressive atelectasis. There are no additional pulmonary nodules. Mild apical predominant emphysema. Hypoventilatory atelectasis at the lung bases. No consolidation to suggest pneumonia.  Calcified lymph nodes in the subcarinal, lower paratracheal, and upper paratracheal stations. No noncalcified adenopathy. There is no pleural or pericardial effusion. The ascending thoracic aorta measures up to 3.4 cm in greatest dimension. The heart size is normal. There is a 10 mm hypodensity in the left lobe of thyroid gland.  Evaluation of the upper abdomen shows no  acute abnormality. There is a 14 x 10 mm cyst adjacent to the gallbladder fossa in the liver. No acute osseous abnormality. No focal osseous lesion.  IMPRESSION: 1.  Calcified 2.3 cm nodule in the posterior right lower lobe corresponding to questioned mass on radiograph. This may reflect pulmonary hamartoma versus less likely a large granuloma. There is adjacent pleural thickening and minimal atelectasis. 2. Calcified mediastinal lymph nodes, may reflect sequela of prior granulomatous disease. 3. Ectasia/mild aneurysmal dilatation of the ascending thoracic aorta, measures 3.4 cm. Recommend annual imaging followup by CTA or MRA. This recommendation follows 2010 ACCF/AHA/AATS/ACR/ASA/SCA/SCAI/SIR/STS/SVM Guidelines for the Diagnosis and Management of Patients With Thoracic Aortic Disease. Circulation.2010; 121: [id number]   Electronically Signed   By: Jeb Levering M.D.   On: 07/07/2014 22:31   Mr Brain Wo Contrast  07/06/2014   CLINICAL DATA:  Progressive LEFT-sided weakness after stroke in late December (acute RIGHT basal ganglia infarct). History of transient ischemic attacks, and possible RIGHT internal carotid artery stenosis.  EXAM: MRI HEAD WITHOUT CONTRAST  MRA HEAD WITHOUT CONTRAST  TECHNIQUE: Multiplanar, multiecho pulse sequences of the brain and surrounding structures were obtained without intravenous contrast. Angiographic images of the head were obtained using MRA technique without contrast.  COMPARISON:  CT of the head July 06, 2014 and MRI of the head Oct 31, 2008  FINDINGS: MRI HEAD FINDINGS  Patchy areas of reduced diffusion within the RIGHT frontal, anterior occipital lobes, as well as the subcentimeter foci of reduced diffusion within the RIGHT basal ganglia, 14 mm focus of reduced diffusion within the RIGHT globus pallidus. Reduced diffusion within all lesions consistent with synchronous ischemia. Blood sensitive sequences extremely motion degraded, limiting sensitivity for petechial hemorrhage, no lobar hematoma.  Areas of acute ischemia demonstrate bright FLAIR signal, superimposed scattered subcentimeter FLAIR T2 hyperintensities within the  supratentorial brain. No midline shift. The ventricles and sulci are overall normal for patient's age.  No abnormal extra-axial fluid collections. Complete loss of the RIGHT internal carotid artery flow void, see below.  Mild paranasal sinus mucosal thickening without air-fluid levels. Ocular globes and orbital contents are unremarkable. Mastoid air cells are well aerated. No suspicious calvarial bone marrow signal. No abnormal sellar expansion. No cerebellar tonsillar ectopia.  MRA HEAD FINDINGS  Anterior circulation: Thready flow related enhancement within RIGHT cervical internal carotid artery, minimal flow related enhancement of RIGHT petrous, complete loss of the RIGHT cavernous and supra clinoid internal carotid artery flow related enhancement. LEFT cervical, petrous, cavernous and supra clinoid internal carotid artery demonstrates robust flow related enhancement.  Apparent retrograde flow related enhancement of the RIGHT carotid terminus. Tiny anterior communicating artery is likely present. Generalized diminutive flow related enhancement RIGHT middle cerebral artery, normal flow related enhancement of the anterior cerebral arteries and LEFT middle cerebral artery.  No aneurysm.  No large vessel occlusion, hemodynamically significant stenosis, abnormal luminal irregularity, aneurysm.  Posterior circulation: Codominant vertebral arteries. Basilar artery is patent, with normal flow related enhancement of the main branch vessels. Normal flow related enhancement of the posterior cerebral arteries. Tiny bilateral posterior communicating arteries are present.  No large vessel occlusion, hemodynamically significant stenosis, abnormal luminal irregularity, aneurysm.  IMPRESSION: MRI head: Acute multifocal infarcts within RIGHT middle cerebral artery territory (including RIGHT basal ganglia). RIGHT anterior occipital likely within RIGHT middle cerebral artery territory/watershed. Limited blood sensitive sequence  without lobar hematoma.  Mild white matter changes, independent of acute areas of ischemia suggest chronic small vessel ischemic disease.  MRA head: RIGHT internal carotid artery occlusion.  Bilateral posterior communicating arteries and suspected tiny anterior communicating artery present. Diminutive RIGHT middle cerebral artery flow related enhancement. Recommend CT angiogram of the neck for further evaluation.  These results will be called to the ordering clinician or representative by the Radiologist Assistant, and communication documented in the PACS or zVision Dashboard.   Electronically Signed   By: Elon Alas   On: 07/06/2014 21:41   Mr Jodene Nam Head/brain Wo Cm  07/06/2014   CLINICAL DATA:  Progressive LEFT-sided weakness after stroke in late December (acute RIGHT basal ganglia infarct). History of transient ischemic attacks, and possible RIGHT internal carotid artery stenosis.  EXAM: MRI HEAD WITHOUT CONTRAST  MRA HEAD WITHOUT CONTRAST  TECHNIQUE: Multiplanar, multiecho pulse sequences of the brain and surrounding structures were obtained without intravenous contrast. Angiographic images of the head were obtained using MRA technique without contrast.  COMPARISON:  CT of the head July 06, 2014 and MRI of the head Oct 31, 2008  FINDINGS: MRI HEAD FINDINGS  Patchy areas of reduced diffusion within the RIGHT frontal, anterior occipital lobes, as well as the subcentimeter foci of reduced diffusion within the RIGHT basal ganglia, 14 mm focus of reduced diffusion within the RIGHT globus pallidus. Reduced diffusion within all lesions consistent with synchronous ischemia. Blood sensitive sequences extremely motion degraded, limiting sensitivity for petechial hemorrhage, no lobar hematoma.  Areas of acute ischemia demonstrate bright FLAIR signal, superimposed scattered subcentimeter FLAIR T2 hyperintensities within the supratentorial brain. No midline shift. The ventricles and sulci are overall normal for  patient's age.  No abnormal extra-axial fluid collections. Complete loss of the RIGHT internal carotid artery flow void, see below.  Mild paranasal sinus mucosal thickening without air-fluid levels. Ocular globes and orbital contents are unremarkable. Mastoid air cells are well aerated. No suspicious calvarial bone marrow signal. No abnormal sellar expansion. No cerebellar tonsillar ectopia.  MRA HEAD FINDINGS  Anterior circulation: Thready flow related enhancement within RIGHT cervical internal carotid artery, minimal flow related enhancement of RIGHT petrous, complete loss of the RIGHT cavernous and supra clinoid internal carotid artery flow related enhancement. LEFT cervical, petrous, cavernous and supra clinoid internal carotid artery demonstrates robust flow related enhancement.  Apparent retrograde flow related enhancement of the RIGHT carotid terminus. Tiny anterior communicating artery is likely present. Generalized diminutive flow related enhancement RIGHT middle cerebral artery, normal flow related enhancement of the anterior cerebral arteries and LEFT middle cerebral artery.  No aneurysm.  No large vessel occlusion, hemodynamically significant stenosis, abnormal luminal irregularity, aneurysm.  Posterior circulation: Codominant vertebral arteries. Basilar artery is patent, with normal flow related enhancement of the main branch vessels. Normal flow related enhancement of the posterior cerebral arteries. Tiny bilateral posterior communicating arteries are present.  No large vessel occlusion, hemodynamically significant stenosis, abnormal luminal irregularity, aneurysm.  IMPRESSION: MRI head: Acute multifocal infarcts within RIGHT middle cerebral artery territory (including RIGHT basal ganglia). RIGHT anterior occipital likely within RIGHT middle cerebral artery territory/watershed. Limited blood sensitive sequence without lobar hematoma.  Mild white matter changes, independent of acute areas of ischemia  suggest chronic small vessel ischemic disease.  MRA head: RIGHT internal carotid artery occlusion. Bilateral posterior communicating arteries and suspected tiny anterior communicating artery present. Diminutive RIGHT middle cerebral artery flow related enhancement. Recommend CT angiogram of the neck for further evaluation.  These results will be called to the ordering clinician or representative by the Radiologist Assistant, and communication documented in the PACS or zVision Dashboard.   Electronically Signed   By: Elon Alas  On: 07/06/2014 21:41     Today   Subjective:   Samuel Shaw today has no headache,no chest abdominal pain,no new weakness tingling or numbness, feels much better . Objective:   Blood pressure 114/98, pulse 49, temperature 97.9 F (36.6 C), temperature source Oral, resp. rate 20, height 6\' 1"  (1.854 m), weight 81 kg (178 lb 9.2 oz), SpO2 100 %.  Intake/Output Summary (Last 24 hours) at 07/08/14 1123 Last data filed at 07/08/14 0545  Gross per 24 hour  Intake    120 ml  Output    250 ml  Net   -130 ml    Exam Awake Alert, Oriented 3, left facial droop, Normal affect [place].AT,PERRAL Supple Neck,No JVD, No cervical lymphadenopathy appriciated.  Symmetrical Chest wall movement, Good air movement bilaterally, CTAB RRR,No Gallops,Rubs or new Murmurs, No Parasternal Heave +ve B.Sounds, Abd Soft, Non tender, No organomegaly appriciated, No rebound -guarding or rigidity. No Cyanosis, Clubbing or edema, No new Rash or bruise, left side weakness.  Data Review     CBC w Diff: Lab Results  Component Value Date   WBC 5.7 07/06/2014   HGB 16.2 07/06/2014   HCT 45.7 07/06/2014   PLT 191 07/06/2014   LYMPHOPCT 44 07/06/2014   MONOPCT 9 07/06/2014   EOSPCT 1 07/06/2014   BASOPCT 1 07/06/2014   CMP: Lab Results  Component Value Date   NA 136 07/06/2014   K 4.4 07/06/2014   CL 104 07/06/2014   CO2 24 07/06/2014   BUN 8 07/06/2014   CREATININE 1.09  07/06/2014   PROT 7.0 07/06/2014   ALBUMIN 4.2 07/06/2014   BILITOT 2.0* 07/06/2014   ALKPHOS 53 07/06/2014   AST 21 07/06/2014   ALT 18 07/06/2014  .  Micro Results No results found for this or any previous visit (from the past 240 hour(s)).   Discharge Instructions     Follow-up Information    Follow up with Tula Nakayama, MD In 1 week.   Specialty:  Family Medicine   Contact information:   [place] [address] [phone]       Follow up with Xu,Jindong, MD. Schedule an appointment as soon as possible for a visit in 2 months.   Specialty:  Neurology   Contact information:   [address] [place] [address] [phone]       Discharge Medications     Medication List    STOP taking these medications        permethrin 5 % cream  Commonly known as:  ELIMITE      TAKE these medications         stroke: mapping our early stages of recovery book Misc  Stroke bok.     acetaminophen 325 MG tablet  Commonly known as:  TYLENOL  Take 2 tablets (650 mg total) by mouth every 6 (six) hours as needed for mild pain or headache.     aspirin 325 MG tablet  Take 325 mg by mouth daily.     atorvastatin 20 MG tablet  Commonly known as:  LIPITOR  Take 1 tablet (20 mg total) by mouth daily at 6 PM.     DSS 100 MG Caps  Take 100 mg by mouth 2 (two) times daily.     folic acid 025 MCG tablet  Commonly known as:  FOLVITE  Take 400 mcg by mouth daily.     nicotine 21 mg/24hr patch  Commonly known as:  NICODERM  CQ - dosed in mg/24 hours  Place 21 mg onto the skin daily.     vitamin B-1 250 MG tablet  Take 250 mg by mouth daily.         Total Time in preparing paper work, data evaluation and todays exam - 35 minutes  Ltanya Bayley M.D on 07/08/2014 at 11:23 AM  [facility] Office  [phone]

## 2014-07-08 NOTE — Progress Notes (Signed)
Patient information reviewed and entered into eRehab system by Vernita Tague, RN, CRRN, PPS Coordinator.  Information including medical coding and functional independence measure will be reviewed and updated through discharge.    

## 2014-07-08 NOTE — Progress Notes (Signed)
Rehab admissions - I am following pt's case and have submitted further clinical information this am to [facility] [place]. I also left a voicemail with Jenny Reichmann, RN with [facility] [employer] [facility]. I met with pt and his wife to share this update and that we are waiting on response from insurance.  We will consider possible inpatient rehab pending pt's medical clearance, his insurance approval and our bed availability.  I will keep the pt/family and medical team aware of any updates as I wait for insurance response.  Please call me with any questions. Thanks.  Nanetta Batty, PT Rehabilitation Admissions Coordinator [phone]

## 2014-07-08 NOTE — Discharge Instructions (Signed)
Follow with Primary MD Tula Nakayama, MD in 7 days   Get CBC, CMP, 2 view Chest X ray checked  by Primary MD next visit.    Activity: As per PT/OT recommendation   Disposition CIR   Diet: Dysphagia 3 diet , with feeding assistance and aspiration precautions as needed.  For Heart failure patients - Check your Weight same time everyday, if you gain over 2 pounds, or you develop in leg swelling, experience more shortness of breath or chest pain, call your Primary MD immediately. Follow Cardiac Low Salt Diet and 1.8 lit/day fluid restriction.   On your next visit with your primary care physician please Get Medicines reviewed and adjusted.   Please request your Prim.MD to go over all Hospital Tests and Procedure/Radiological results at the follow up, please get all Hospital records sent to your Prim MD by signing hospital release before you go home.   If you experience worsening of your admission symptoms, develop shortness of breath, life threatening emergency, suicidal or homicidal thoughts you must seek medical attention immediately by calling 911 or calling your MD immediately  if symptoms less severe.  You Must read complete instructions/literature along with all the possible adverse reactions/side effects for all the Medicines you take and that have been prescribed to you. Take any new Medicines after you have completely understood and accpet all the possible adverse reactions/side effects.   Do not drive, operating heavy machinery, perform activities at heights, swimming or participation in water activities or provide baby sitting services if your were admitted for syncope or siezures until you have seen by Primary MD or a Neurologist and advised to do so again.  Do not drive when taking Pain medications.    Do not take more than prescribed Pain, Sleep and Anxiety Medications  Special Instructions: If you have smoked or chewed Tobacco  in the last 2 yrs please stop smoking, stop  any regular Alcohol  and or any Recreational drug use.  Wear Seat belts while driving.   Please note  You were cared for by a hospitalist during your hospital stay. If you have any questions about your discharge medications or the care you received while you were in the hospital after you are discharged, you can call the unit and asked to speak with the hospitalist on call if the hospitalist that took care of you is not available. Once you are discharged, your primary care physician will handle any further medical issues. Please note that NO REFILLS for any discharge medications will be authorized once you are discharged, as it is imperative that you return to your primary care physician (or establish a relationship with a primary care physician if you do not have one) for your aftercare needs so that they can reassess your need for medications and monitor your lab values.

## 2014-07-08 NOTE — Progress Notes (Signed)
Pt arrived to unit at 31 with wife at bedside. Reviewed rehab process, booklet, and safety plan with pt and wife. Verbal understanding. Bed alarm on, SR x3, and call bell within reach. Wife at bedside

## 2014-07-08 NOTE — PMR Pre-admission (Signed)
PMR Admission Coordinator Pre-Admission Assessment  Patient: Samuel Shaw is an 54 y.o., male MRN: [id number] DOB: 02/03/1961 Height: 6' 1"  (185.4 cm) Weight: 81 kg (178 lb 9.2 oz)              Insurance Information HMO:     PPO: yes     PCP:      IPA:      80/20:      OTHER:  PRIMARY: [employer] [place]      Policy#: [id number]      Subscriber: self CM Name: Algis Downs, RN      Phone#: [phone]     Fax#: [phone] Approval given on 07-08-14 from [facility] [employer] from 07-08-14 through 07-15-14 with updates due to Jenny Reichmann, RN on 07-14-14 to above fax Pre-Cert#: [id number]      Employer: [employer] Benefits:  Phone #: [phone]     Name: Harolyn Rutherford. 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 male with history of tobacco abuse, TIA 2 years ago, CVA late December with recent discharge from hospital in [place] where he was visiting and maintained on aspirin 325 mg daily. Patient lives with his wife in [place]. Presented 07/06/2014 with progressive left-sided weakness since recent discharge from the hospital. CT scan reviewed from outside hospital in December showed acute right basal ganglia infarct. There was possible right ICA stenosis identified on carotid ultrasound. MRI of  the brain 07/06/2014 showed acute multifocal infarcts within right middle cerebral artery territory including right basal ganglia, right anterior occipital and right middle cerebral artery territory/ watershed. 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 CTA and GO of the neck showed right ICA occlusion 1 cm above its origin. Echocardiogram pending. Patient did not receive TPA. Neurology follow-up currently maintained on aspirin therapy. Subcutaneous heparin added for DVT prophylaxis. Mechanical soft diet. Occupational therapy evaluation completed 07/07/2013 with recommendations of physical medicine rehabilitation consult.  NIH Total: 2  Past Medical History  Past Medical History  Diagnosis Date  . Stroke     Family History  family history is not on file.  Prior Rehab/Hospitalizations: pt was hospitalized in late December for CVA in [place] and received limited acute therapy per wife. He has had no other previous rehab.   Current Medications  Current facility-administered medications:  stroke: mapping our early stages of recovery book, , Does not apply, Once, Phillips Climes, MD;  acetaminophen (TYLENOL) tablet 650 mg, 650 mg, Oral, Q6H PRN, Gardiner Barefoot, NP, 650 mg at 07/08/14 0241;  aspirin EC tablet 325 mg, 325 mg, Oral, Daily, Maryann Mikhail, DO, 325 mg at 07/08/14 0929;  atorvastatin (LIPITOR) tablet 20 mg, 20 mg, Oral, q1800, Maryann Mikhail, DO, 20 mg at 07/07/14 1753 docusate sodium (COLACE) capsule 100 mg, 100 mg, Oral, BID, Maryann Mikhail, DO, 100 mg at 07/08/14 7858;  heparin injection 5,000 Units, 5,000 Units, Subcutaneous, 3 times per day, Phillips Climes, MD, 5,000 Units at 07/08/14 0533;  nicotine (NICODERM CQ - dosed in mg/24 hours) patch 21 mg, 21 mg, Transdermal, Daily, Phillips Climes, MD, 21 mg at 07/08/14 0930  Patients Current Diet: DIET DYS 3, thin liquids, full supervision, meds with puree  Precautions / Restrictions Precautions Precautions:  Fall Restrictions Weight Bearing Restrictions: No   Prior Activity Level Community (5-7x/wk): Prior to CVA in late Dec., pt was working full time in Landscape [employer]. He enjoys sports and his family.   Home Assistive Devices / Equipment Home Assistive Devices/Equipment: None  Prior Functional Level Prior Function Level of Independence: Independent Comments: prior to dec pt was independent and worked in [employer].  Current Functional Level Cognition  Arousal/Alertness: Awake/alert Overall Cognitive Status: Impaired/Different from baseline Current Attention Level: Sustained Orientation Level: Oriented X4 Safety/Judgement: Decreased awareness of safety, Decreased awareness of deficits General Comments: Slightly impulsive with inability to recall deficits.  Attention: Sustained Sustained Attention: Impaired Sustained Attention Impairment: Verbal basic, Functional basic Memory: Impaired Memory Impairment: Decreased recall of new information Awareness: Impaired Awareness Impairment: Intellectual impairment, Emergent impairment Problem Solving: Impaired Problem Solving Impairment: Functional basic    Extremity Assessment (includes Sensation/Coordination)          ADLs  Overall ADL's : Needs assistance/impaired Eating/Feeding: NPO Grooming: Wash/dry hands, Wash/dry face, Oral care, Minimal assistance, Sitting Grooming Details (indicate cue type and reason): Pt requires cues to attempt to use L hand and VCs to problem solve how to do things diffferently with decreased use of L side.   Upper Body Bathing: Moderate assistance, Sitting Upper Body Bathing Details (indicate cue type and reason): assist with hand over hand using L side to wash R. Lower Body Bathing: Maximal assistance, Sit to/from stand Lower Body Bathing Details (indicate cue type and reason): assist in standing. Pt with very poor balance.  Cues to cross L leg over R to attempt to reach foot. Upper Body Dressing :  Maximal assistance, Sitting Upper Body Dressing Details (indicate cue type and reason): educated on hemi dressing technique. Lower Body Dressing: Maximal assistance, Sit to/from stand Lower Body Dressing Details (indicate cue type and reason): poor standing balance and safety. Toilet Transfer: Moderate assistance, Stand-pivot, BSC Toileting- Clothing Manipulation and Hygiene: Maximal assistance, Sit to/from stand Toileting - Clothing Manipulation Details (indicate cue type and reason): Pt requires max assist +1 for toileting and cleaning self. Functional mobility during ADLs: Maximal assistance General ADL Comments: Pt requires great assist for adls due to L side weakness and decreased safety awareness.    Mobility  Overal bed mobility: Needs Assistance Bed Mobility: Sit to Supine Supine to sit: Mod assist Sit to supine: Min guard General bed mobility comments: A for Les and trunk support into sitting. Cues for positioning    Transfers  Overall transfer level: Needs assistance Equipment used: 1 person hand held assist Transfers: Stand Pivot Transfers, Sit to/from Stand Sit to Stand: Mod assist Stand pivot transfers: Max assist, Mod assist, +2 safety/equipment (+2 for safety (RN)) General transfer comment: Cues for technique and hand/foot placement. "bear hug" technique utilized for standing to enable knee blocking.     Ambulation / Gait / Stairs / Wheelchair Mobility  Ambulation/Gait Ambulation/Gait assistance: +2 physical assistance,  Mod assist, Max assist Ambulation Distance (Feet): 10 Feet (x3) Assistive device: 2 person hand held assist Gait Pattern/deviations: Step-to pattern, Decreased dorsiflexion - right, Decreased dorsiflexion - left, Decreased weight shift to right, Narrow base of support, Trunk flexed Gait velocity interpretation: Below normal speed for age/gender General Gait Details: +2 asssit to weight shift and advance BLE. Very increased knee flexion/buckling on L LE.  Use of rail in hallway on the R for ambulatoin. Cues to attempt to keep upright posture and weightshifting.    Posture / Balance Dynamic Sitting Balance Sitting balance - Comments: Pt with L lean    Special needs/care consideration BiPAP/CPAP no CPM no  Continuous Drip IV no  Dialysis no         Life Vest no  Oxygen no  Special Bed no  Trach Size no  Wound Vac (area) no       Skin - no issues                               Bowel mgmt: last BM on 07-03-14 Bladder mgmt: currently using urinal but wife says he is often "getting the urinal too late" Diabetic mgmt no   Previous Home Environment Living Arrangements: Spouse/significant other  Lives With: Spouse Available Help at Discharge: Family Type of Home: House Home Care Services: No Additional Comments: 6 steps to enter with rail on R; then 1 step into the house. 1 level home  Discharge Living Setting Plans for Discharge Living Setting: Patient's home Type of Home at Discharge: House Discharge Home Layout: One level Discharge Home Access: Stairs to enter Entrance Stairs-Rails: right Entrance Stairs-Number of Steps: 6 steps, then 1 more step into house Does the patient have any problems obtaining your medications?: No  Social/Family/Support Systems Patient Roles: Spouse, Parent, Other (Comment) (works full time in Landscape architect, enjoys sports) Sport and exercise psychologist Information: wife [name] is primary contact Anticipated Caregiver: wife and dtr Gonzella Lex Anticipated Caregiver's Contact Information: see above Ability/Limitations of Caregiver: [place] but dtr will care for pt during the day. Other family members also supportive. Caregiver Availability: 24/7 Discharge Plan Discussed with Primary Caregiver: Yes Is Caregiver In Agreement with Plan?: Yes Does Caregiver/Family have Issues with Lodging/Transportation while Pt is in Rehab?: No (wife states that someone will always be staying with pt.)  Goals/Additional  Needs Patient/Family Goal for Rehab: Supervision with PT, OT and SLP Expected length of stay: 16-23 days Cultural Considerations: attends a [employer] Dietary Needs: dys 3, full supervision, thin liquids, meds with puree Equipment Needs: to be determined Pt/Family Agrees to Admission and willing to participate: Yes Program Orientation Provided & Reviewed with Pt/Caregiver Including Roles  & Responsibilities: Yes   Decrease burden of Care through IP rehab admission: NA   Possible need for SNF placement upon discharge: not anticipated  Patient Condition: This patient's condition remains as documented in the consult dated 07-07-14, in which the Rehabilitation Physician determined and documented that the patient's condition is appropriate for intensive rehabilitative care in an inpatient rehabilitation facility. Will admit to inpatient rehab today.  Preadmission Screen Completed By:  Nanetta Batty, PT, 07/08/2014 11:13 AM ______________________________________________________________________   Discussed status with Dr. Naaman Plummer on 07-08-14 at 1113 and received telephone approval for admission today.  Admission Coordinator: Nanetta Batty, PT, time1113/Date 07-08-14

## 2014-07-08 NOTE — H&P (View-Only) (Signed)
Physical Medicine and Rehabilitation Admission H&P    Chief Complaint  Patient presents with  . Weakness  : HPI: Samuel Shaw is a 54 y.o. male with history of tobacco abuse, TIA 2 years ago, CVA late December with recent discharge from hospital in [place] where he was visiting and maintained on aspirin 325 mg daily. Patient lives with his wife in [place]. Presented 07/06/2014 with progressive left-sided weakness and slurred speech since recent discharge from the hospital. CT scan reviewed from outside hospital in December showed acute right basal ganglia infarct. There was possible right ICA stenosis identified on carotid ultrasound. MRI of the brain 07/06/2014 showed acute multifocal infarcts within right middle cerebral artery territory including right basal ganglia, right anterior occipital and right middle cerebral artery territory/ watershed. 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 CTA  of the neck showed right ICA occlusion 1 cm above its origin. Echocardiogram showed ejection fraction 60% no wall motion abnormalities. Patient did not receive TPA. Neurology follow-up currently maintained on aspirin therapy. Subcutaneous heparin added for DVT prophylaxis. Mechanical soft diet. Physical and Occupational therapy evaluations completed 07/07/2013 with recommendations of physical medicine rehabilitation consult  ROS Review of Systems  Neurological: Positive for dizziness and weakness.  All other systems reviewed and are negative    Past Medical History  Diagnosis Date  . Stroke    History reviewed. No pertinent past surgical history. History reviewed. No pertinent family history. Social History:  reports that he has been smoking.  He does not have any smokeless tobacco history on file. He reports that he does not drink alcohol or use illicit drugs. Allergies: No Known Allergies Medications Prior to Admission  Medication Sig Dispense Refill  . aspirin 325 MG  tablet Take 325 mg by mouth daily.    . folic acid (FOLVITE) 150 MCG tablet Take 400 mcg by mouth daily.    . nicotine (NICODERM CQ - DOSED IN MG/24 HOURS) 21 mg/24hr patch Place 21 mg onto the skin daily.    . Thiamine HCl (VITAMIN B-1) 250 MG tablet Take 250 mg by mouth daily.    . permethrin (ELIMITE) 5 % cream Apply from neck down at night after shower. Wash off in the morning. May repeat in 1 week if still symptomatic. (Patient not taking: Reported on 07/06/2014) 60 g 0    Home: Home Living Family/patient expects to be discharged to:: Inpatient rehab Living Arrangements: Spouse/significant other Available Help at Discharge: Family Type of Home: House Additional Comments: 6 steps to enter with rail on R; then 1 step into the house. 1 level home  Lives With: Spouse   Functional History: Prior Function Level of Independence: Independent Comments: prior to dec pt was independent and worked in [employer].  Functional Status:  Mobility: Bed Mobility Overal bed mobility: Needs Assistance Bed Mobility: Sit to Supine Supine to sit: Mod assist Sit to supine: Min guard General bed mobility comments: Cues to bring LLE onto the bed when going to supine but able to with steady A to intiate movement. Pt able to bring trunk up in bed from supine position without A Transfers Overall transfer level: Needs assistance Equipment used: 1 person hand held assist Transfers: Stand Pivot Transfers, Sit to/from Stand Sit to Stand: Mod assist Stand pivot transfers: Max assist, Mod assist, +2 safety/equipment (+2 for safety (RN)) General transfer comment: Cues for technique and hand/foot placement. Once in standing required manual and verbal facilitation for weightshifting to advance LE's to  perform the transfer Ambulation/Gait Ambulation/Gait assistance: +2 physical assistance General Gait Details: Need to be further assessed but pt declining due to just wanting to get back in bed. From limited  assessment during transfer back to bed, would need +2 in open space but could try using R rail for UE support and feel pt might do well with that to start with gait trials    ADL: ADL Overall ADL's : Needs assistance/impaired Eating/Feeding: NPO Grooming: Wash/dry hands, Wash/dry face, Oral care, Minimal assistance, Sitting Grooming Details (indicate cue type and reason): Pt requires cues to attempt to use L hand and VCs to problem solve how to do things diffferently with decreased use of L side.   Upper Body Bathing: Moderate assistance, Sitting Upper Body Bathing Details (indicate cue type and reason): assist with hand over hand using L side to wash R. Lower Body Bathing: Maximal assistance, Sit to/from stand Lower Body Bathing Details (indicate cue type and reason): assist in standing. Pt with very poor balance.  Cues to cross L leg over R to attempt to reach foot. Upper Body Dressing : Maximal assistance, Sitting Upper Body Dressing Details (indicate cue type and reason): educated on hemi dressing technique. Lower Body Dressing: Maximal assistance, Sit to/from stand Lower Body Dressing Details (indicate cue type and reason): poor standing balance and safety. Toilet Transfer: Moderate assistance, Stand-pivot, BSC Toileting- Clothing Manipulation and Hygiene: Maximal assistance, Sit to/from stand Toileting - Clothing Manipulation Details (indicate cue type and reason): Pt requires max assist +1 for toileting and cleaning self. Functional mobility during ADLs: Maximal assistance General ADL Comments: Pt requires great assist for adls due to L side weakness and decreased safety awareness.  Cognition: Cognition Overall Cognitive Status: Impaired/Different from baseline Arousal/Alertness: Awake/alert Orientation Level: Oriented X4 Attention: Sustained Sustained Attention: Impaired Sustained Attention Impairment: Verbal basic, Functional basic Memory: Impaired Memory Impairment:  Decreased recall of new information Awareness: Impaired Awareness Impairment: Intellectual impairment, Emergent impairment Problem Solving: Impaired Problem Solving Impairment: Functional basic Cognition Arousal/Alertness: Awake/alert Behavior During Therapy: Flat affect Overall Cognitive Status: Impaired/Different from baseline Area of Impairment: Awareness, Safety/judgement, Attention, Memory, Problem solving Current Attention Level: Sustained Safety/Judgement: Decreased awareness of safety, Decreased awareness of deficits Problem Solving: Difficulty sequencing, Requires verbal cues, Requires tactile cues General Comments: Pt requiring cues for safety with mobility and slightly impulsive when attempting to get up chair. Decreased awareness noted to L sided weakness  Physical Exam: Blood pressure 131/79, pulse 46, temperature 97.9 F (36.6 C), temperature source Oral, resp. rate 18, height _0  (1.854 m), weight 81 kg (178 lb 9.2 oz), SpO2 99 %. Physical Exam Constitutional: He appears well-developed.  HENT: oral mucosa pink Head: Normocephalic and atraumatic.  Left facial weakness  Eyes: EOM are normal. Scleral icterus is present.  Neck: Normal range of motion. Neck supple. No tracheal deviation present. No thyromegaly present.  Cardiovascular: Normal rate and regular rhythm.  Respiratory: Effort normal and breath sounds normal. No respiratory distress.  GI: Soft. Bowel sounds are normal. He exhibits no distension.  Musculoskeletal: Normal range of motion.  Neurological: He is alert.  Left central 7 and tongue deviation. Speech slightly slurred,.   MMT-- Right: 4+/5 deltoid, 4+/5 bicep, 4+/5 tricep, 4+/5 wrist extension, 4+/5 hand intrinsics. 4+/5 hip flexor, 4+/5 knee extension, 4+/5 ankle dorsiflexion, 4+/5 ankle plantarflexion. Left: 3/5 deltoid, 3+/5 bicep, 3+/5 tricep, 3+/5 wrist extension, 3+/5 hand intrinsics. 3/5 hip flexor, 3/5 knee extension, 3+/5 ankle  dorsiflexion, 3+/5 ankle plantarflexion. Mild sensory changes on the left?  Left inattention.   Patient is able to state his name and age. Follows simple commands  Skin: Skin is warm and dry.  Psychiatric:  Extremely flat. Remains slow to engage    Results for orders placed or performed during the hospital encounter of 07/06/14 (from the past 48 hour(s))  CBC     Status: None   Collection Time: 07/06/14 12:58 PM  Result Value Ref Range   WBC 5.7 4.0 - 10.5 K/uL   RBC 5.07 4.22 - 5.81 MIL/uL   Hemoglobin 16.2 13.0 - 17.0 g/dL   HCT 45.7 39.0 - 52.0 %   MCV 90.1 78.0 - 100.0 fL   MCH 32.0 26.0 - 34.0 pg   MCHC 35.4 30.0 - 36.0 g/dL   RDW 13.1 11.5 - 15.5 %   Platelets 191 150 - 400 K/uL  Differential     Status: None   Collection Time: 07/06/14 12:58 PM  Result Value Ref Range   Neutrophils Relative % 45 43 - 77 %   Neutro Abs 2.5 1.7 - 7.7 K/uL   Lymphocytes Relative 44 12 - 46 %   Lymphs Abs 2.6 0.7 - 4.0 K/uL   Monocytes Relative 9 3 - 12 %   Monocytes Absolute 0.5 0.1 - 1.0 K/uL   Eosinophils Relative 1 0 - 5 %   Eosinophils Absolute 0.1 0.0 - 0.7 K/uL   Basophils Relative 1 0 - 1 %   Basophils Absolute 0.0 0.0 - 0.1 K/uL  Comprehensive metabolic panel     Status: Abnormal   Collection Time: 07/06/14 12:58 PM  Result Value Ref Range   Sodium 136 135 - 145 mmol/L    Comment: Please note change in reference range.   Potassium 4.4 3.5 - 5.1 mmol/L    Comment: Please note change in reference range.   Chloride 104 96 - 112 mEq/L   CO2 24 19 - 32 mmol/L   Glucose, Bld 99 70 - 99 mg/dL   BUN 8 6 - 23 mg/dL   Creatinine, Ser 1.09 0.50 - 1.35 mg/dL   Calcium 9.6 8.4 - 10.5 mg/dL   Total Protein 7.0 6.0 - 8.3 g/dL   Albumin 4.2 3.5 - 5.2 g/dL   AST 21 0 - 37 U/L   ALT 18 0 - 53 U/L   Alkaline Phosphatase 53 39 - 117 U/L   Total Bilirubin 2.0 (H) 0.3 - 1.2 mg/dL   GFR calc non Af Amer 76 (L) >90 mL/min   GFR calc Af Amer 88 (L) >90 mL/min    Comment: (NOTE) The eGFR has  been calculated using the CKD EPI equation. This calculation has not been validated in all clinical situations. eGFR's persistently <90 mL/min signify possible Chronic Kidney Disease.    Anion gap 8 5 - 15  I-stat troponin, ED (not at [facility])     Status: None   Collection Time: 07/06/14  1:16 PM  Result Value Ref Range   Troponin i, poc 0.00 0.00 - 0.08 ng/mL   Comment 3            Comment: Due to the release kinetics of cTnI, a negative result within the first hours of the onset of symptoms does not rule out myocardial infarction with certainty. If myocardial infarction is still suspected, repeat the test at appropriate intervals.   APTT     Status: None   Collection Time: 07/06/14  2:20 PM  Result Value Ref Range   aPTT 29 24 - 37  seconds  Protime-INR     Status: None   Collection Time: 07/06/14  2:20 PM  Result Value Ref Range   Prothrombin Time 14.6 11.6 - 15.2 seconds   INR 1.13 0.00 - 1.49  Urinalysis, Routine w reflex microscopic     Status: Abnormal   Collection Time: 07/07/14 12:07 AM  Result Value Ref Range   Color, Urine AMBER (A) YELLOW    Comment: BIOCHEMICALS MAY BE AFFECTED BY COLOR   APPearance HAZY (A) CLEAR   Specific Gravity, Urine 1.026 1.005 - 1.030   pH 5.5 5.0 - 8.0   Glucose, UA NEGATIVE NEGATIVE mg/dL   Hgb urine dipstick NEGATIVE NEGATIVE   Bilirubin Urine SMALL (A) NEGATIVE   Ketones, ur NEGATIVE NEGATIVE mg/dL   Protein, ur NEGATIVE NEGATIVE mg/dL   Urobilinogen, UA 1.0 0.0 - 1.0 mg/dL   Nitrite NEGATIVE NEGATIVE   Leukocytes, UA NEGATIVE NEGATIVE    Comment: MICROSCOPIC NOT DONE ON URINES WITH NEGATIVE PROTEIN, BLOOD, LEUKOCYTES, NITRITE, OR GLUCOSE <1000 mg/dL.  Lipid panel     Status: Abnormal   Collection Time: 07/07/14  4:02 AM  Result Value Ref Range   Cholesterol 182 0 - 200 mg/dL   Triglycerides 116 <150 mg/dL   HDL 24 (L) >39 mg/dL   Total CHOL/HDL Ratio 7.6 RATIO   VLDL 23 0 - 40 mg/dL   LDL Cholesterol 135 (H) 0 - 99 mg/dL     Comment:        Total Cholesterol/HDL:CHD Risk Coronary Heart Disease Risk Table                     Men   Women  1/2 Average Risk   3.4   3.3  Average Risk       5.0   4.4  2 X Average Risk   9.6   7.1  3 X Average Risk  23.4   11.0        Use the calculated Patient Ratio above and the CHD Risk Table to determine the patient's CHD Risk.        ATP III CLASSIFICATION (LDL):  <100     mg/dL   Optimal  100-129  mg/dL   Near or Above                    Optimal  130-159  mg/dL   Borderline  160-189  mg/dL   High  >190     mg/dL   Very High   Hemoglobin A1c     Status: None   Collection Time: 07/07/14  4:02 AM  Result Value Ref Range   Hgb A1c MFr Bld 5.6 <5.7 %    Comment: (NOTE)                                                                       According to the ADA Clinical Practice Recommendations for 2011, when HbA1c is used as a screening test:  >=6.5%   Diagnostic of Diabetes Mellitus           (if abnormal result is confirmed) 5.7-6.4%   Increased risk of developing Diabetes Mellitus References:Diagnosis and Classification of Diabetes Mellitus,Diabetes WIOX,7353,29(JMEQA 1):S62-S69 and Standards of Medical Care in  Diabetes - 2011,Diabetes Care,2011,34 (Suppl 1):S11-S61.    Mean Plasma Glucose 114 <117 mg/dL    Comment: Performed at [facility]  T[facility]     Status: None   Collection Time: 07/07/14 12:10 PM  Result Value Ref Range   TSH 0.473 0.350 - 4.500 uIU/mL   Dg Chest 2 View  07/06/2014   CLINICAL DATA:  Increasing left-sided weakness. Previous stroke. Smoker.  EXAM: CHEST  2 VIEW  COMPARISON:  10/31/2008.  FINDINGS: Normal sized heart. Clear lungs. Increased density overlying the posterior aspect of the lower thoracic spine.  IMPRESSION: Possible mass overlying the posterior aspect of the lower thoracic spine. A chest CT with contrast is recommended.   Electronically Signed   By: Enrique Sack M.D.   On: 07/06/2014 15:54   Ct Head Wo  Contrast  07/06/2014   CLINICAL DATA:  Worsened left side weakness. The patient was recently diagnosed with stroke at an outside facility.  EXAM: CT HEAD WITHOUT CONTRAST  TECHNIQUE: Contiguous axial images were obtained from the base of the skull through the vertex without intravenous contrast.  COMPARISON:  Head CT scan and brain MRI 10/31/2008.  FINDINGS: Hypoattenuation in the anterior right MCA territory correlates with recent diagnosis of infarct. Scattered areas of hypoattenuation are also seen in the deep white matter with lacunar infarction which appears remote in the right internal capsule identified. No evidence of acute infarction, hemorrhage, mass lesion, mass effect, midline shift or abnormal extra-axial fluid collection is identified. There is no hydrocephalus or pneumocephalus. The calvarium is intact.  IMPRESSION: No acute finding.  Findings consistent with subacute to remote right MCA territory infarct. Chronic microvascular ischemic change also noted.   Electronically Signed   By: Inge Rise M.D.   On: 07/06/2014 14:57   Ct Angio Neck W/cm &/or Wo/cm  07/07/2014   CLINICAL DATA:  54 year old male with right hemispheric infarcts. Subsequent encounter.  EXAM: CT ANGIOGRAPHY NECK  TECHNIQUE: Multidetector CT imaging of the neck was performed using the standard protocol during bolus administration of intravenous contrast. Multiplanar CT image reconstructions and MIPs were obtained to evaluate the vascular anatomy. Carotid stenosis measurements (when applicable) are obtained utilizing NASCET criteria, using the distal internal carotid diameter as the denominator.  CONTRAST:  50 cc Omnipaque 350.  COMPARISON:  07/06/2014 brain MR. 11/03/2008 catheter angiogram.  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 Recommend annual imaging followup by CTA or MRA. This recommendation follows 2010 ACCF/AHA/AATS/ACR/ASA/SCA/SCAI/SIR/STS/SVM Guidelines for the Diagnosis and  Management of Patients With Thoracic Aortic Disease. Circulation.  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 Etiology of occlusion is indeterminate.  Left carotid system: Focal plaque proximal left internal carotid artery without measurable/ significant stenosis.  Vertebral arteries:Mild narrowing proximal right vertebral artery.  Other: Calcified mediastinal lymph nodes suggestive of prior granulomatous exposure. Small thyroid lesions more notable on the left measuring up to 1.2 cm. Dental disease. Subtle low-density structure within the left palatine tonsil may represent result of prior inflammation without discrete mass identified. Cervical spondylotic changes with spinal stenosis most notable C3-4 thru C5-6 level.  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 Etiology indeterminate.  Focal plaque proximal left internal carotid artery without measurable/significant stenosis.  Mild narrowing proximal right vertebral artery.  Slightly ectatic ascending thoracic aorta (3.4 cm) with follow-up as noted above.  Surrounding incidental findings as detailed above.  These results will be  called to the ordering clinician or representative by the Radiologist Assistant, and communication documented in the PACS or zVision Dashboard.   Electronically Signed   By: Chauncey Cruel M.D.   On: 07/07/2014 09:00   Ct Chest W Contrast  07/07/2014   CLINICAL DATA:  Question pulmonary mass on x-ray.  EXAM: CT CHEST WITH CONTRAST  TECHNIQUE: Multidetector CT imaging of the chest was performed during intravenous contrast administration.  CONTRAST:  80m OMNIPAQUE IOHEXOL 300 MG/ML  SOLN  COMPARISON:  Radiographs 1 day prior.  FINDINGS: Within the posterior right lower lobe there is a 2.3 x 1.8 cm nodule. There is a large amount of internal calcification. No definite macroscopic fat. This abuts the posterior pleura with associated pleural  thickening and likely compressive atelectasis. There are no additional pulmonary nodules. Mild apical predominant emphysema. Hypoventilatory atelectasis at the lung bases. No consolidation to suggest pneumonia.  Calcified lymph nodes in the subcarinal, lower paratracheal, and upper paratracheal stations. No noncalcified adenopathy. There is no pleural or pericardial effusion. The ascending thoracic aorta measures up to 3.4 cm in greatest dimension. The heart size is normal. There is a 10 mm hypodensity in the left lobe of thyroid gland.  Evaluation of the upper abdomen shows no acute abnormality. There is a 14 x 10 mm cyst adjacent to the gallbladder fossa in the liver. No acute osseous abnormality. No focal osseous lesion.  IMPRESSION: 1. Calcified 2.3 cm nodule in the posterior right lower lobe corresponding to questioned mass on radiograph. This may reflect pulmonary hamartoma versus less likely a large granuloma. There is adjacent pleural thickening and minimal atelectasis. 2. Calcified mediastinal lymph nodes, may reflect sequela of prior granulomatous disease. 3. Ectasia/mild aneurysmal dilatation of the ascending thoracic aorta, measures 3.4 cm. Recommend annual imaging followup by CTA or MRA. This recommendation follows 2010 ACCF/AHA/AATS/ACR/ASA/SCA/SCAI/SIR/STS/SVM Guidelines for the Diagnosis and Management of Patients With Thoracic Aortic Disease. Circulation.2010; 121:: [id number]  Electronically Signed   By: MJeb LeveringM.D.   On: 07/07/2014 22:31   Mr Brain Wo Contrast  07/06/2014   CLINICAL DATA:  Progressive LEFT-sided weakness after stroke in late December (acute RIGHT basal ganglia infarct). History of transient ischemic attacks, and possible RIGHT internal carotid artery stenosis.  EXAM: MRI HEAD WITHOUT CONTRAST  MRA HEAD WITHOUT CONTRAST  TECHNIQUE: Multiplanar, multiecho pulse sequences of the brain and surrounding structures were obtained without intravenous contrast. Angiographic  images of the head were obtained using MRA technique without contrast.  COMPARISON:  CT of the head July 06, 2014 and MRI of the head Oct 31, 2008  FINDINGS: MRI HEAD FINDINGS  Patchy areas of reduced diffusion within the RIGHT frontal, anterior occipital lobes, as well as the subcentimeter foci of reduced diffusion within the RIGHT basal ganglia, 14 mm focus of reduced diffusion within the RIGHT globus pallidus. Reduced diffusion within all lesions consistent with synchronous ischemia. Blood sensitive sequences extremely motion degraded, limiting sensitivity for petechial hemorrhage, no lobar hematoma.  Areas of acute ischemia demonstrate bright FLAIR signal, superimposed scattered subcentimeter FLAIR T2 hyperintensities within the supratentorial brain. No midline shift. The ventricles and sulci are overall normal for patient's age.  No abnormal extra-axial fluid collections. Complete loss of the RIGHT internal carotid artery flow void, see below.  Mild paranasal sinus mucosal thickening without air-fluid levels. Ocular globes and orbital contents are unremarkable. Mastoid air cells are well aerated. No suspicious calvarial bone marrow signal. No abnormal sellar expansion. No cerebellar tonsillar ectopia.  MRA HEAD FINDINGS  Anterior circulation: Thready flow related enhancement within RIGHT cervical internal carotid artery, minimal flow related enhancement of RIGHT petrous, complete loss of the RIGHT cavernous and supra clinoid internal carotid artery flow related enhancement. LEFT cervical, petrous, cavernous and supra clinoid internal carotid artery demonstrates robust flow related enhancement.  Apparent retrograde flow related enhancement of the RIGHT carotid terminus. Tiny anterior communicating artery is likely present. Generalized diminutive flow related enhancement RIGHT middle cerebral artery, normal flow related enhancement of the anterior cerebral arteries and LEFT middle cerebral artery.  No aneurysm.   No large vessel occlusion, hemodynamically significant stenosis, abnormal luminal irregularity, aneurysm.  Posterior circulation: Codominant vertebral arteries. Basilar artery is patent, with normal flow related enhancement of the main branch vessels. Normal flow related enhancement of the posterior cerebral arteries. Tiny bilateral posterior communicating arteries are present.  No large vessel occlusion, hemodynamically significant stenosis, abnormal luminal irregularity, aneurysm.  IMPRESSION: MRI head: Acute multifocal infarcts within RIGHT middle cerebral artery territory (including RIGHT basal ganglia). RIGHT anterior occipital likely within RIGHT middle cerebral artery territory/watershed. Limited blood sensitive sequence without lobar hematoma.  Mild white matter changes, independent of acute areas of ischemia suggest chronic small vessel ischemic disease.  MRA head: RIGHT internal carotid artery occlusion. Bilateral posterior communicating arteries and suspected tiny anterior communicating artery present. Diminutive RIGHT middle cerebral artery flow related enhancement. Recommend CT angiogram of the neck for further evaluation.  These results will be called to the ordering clinician or representative by the Radiologist Assistant, and communication documented in the PACS or zVision Dashboard.   Electronically Signed   By: Elon Alas   On: 07/06/2014 21:41   Mr Jodene Nam Head/brain Wo Cm  07/06/2014   CLINICAL DATA:  Progressive LEFT-sided weakness after stroke in late December (acute RIGHT basal ganglia infarct). History of transient ischemic attacks, and possible RIGHT internal carotid artery stenosis.  EXAM: MRI HEAD WITHOUT CONTRAST  MRA HEAD WITHOUT CONTRAST  TECHNIQUE: Multiplanar, multiecho pulse sequences of the brain and surrounding structures were obtained without intravenous contrast. Angiographic images of the head were obtained using MRA technique without contrast.  COMPARISON:  CT of the head  July 06, 2014 and MRI of the head Oct 31, 2008  FINDINGS: MRI HEAD FINDINGS  Patchy areas of reduced diffusion within the RIGHT frontal, anterior occipital lobes, as well as the subcentimeter foci of reduced diffusion within the RIGHT basal ganglia, 14 mm focus of reduced diffusion within the RIGHT globus pallidus. Reduced diffusion within all lesions consistent with synchronous ischemia. Blood sensitive sequences extremely motion degraded, limiting sensitivity for petechial hemorrhage, no lobar hematoma.  Areas of acute ischemia demonstrate bright FLAIR signal, superimposed scattered subcentimeter FLAIR T2 hyperintensities within the supratentorial brain. No midline shift. The ventricles and sulci are overall normal for patient's age.  No abnormal extra-axial fluid collections. Complete loss of the RIGHT internal carotid artery flow void, see below.  Mild paranasal sinus mucosal thickening without air-fluid levels. Ocular globes and orbital contents are unremarkable. Mastoid air cells are well aerated. No suspicious calvarial bone marrow signal. No abnormal sellar expansion. No cerebellar tonsillar ectopia.  MRA HEAD FINDINGS  Anterior circulation: Thready flow related enhancement within RIGHT cervical internal carotid artery, minimal flow related enhancement of RIGHT petrous, complete loss of the RIGHT cavernous and supra clinoid internal carotid artery flow related enhancement. LEFT cervical, petrous, cavernous and supra clinoid internal carotid artery demonstrates robust flow related enhancement.  Apparent retrograde flow related enhancement of the RIGHT carotid terminus. Tiny anterior communicating artery  is likely present. Generalized diminutive flow related enhancement RIGHT middle cerebral artery, normal flow related enhancement of the anterior cerebral arteries and LEFT middle cerebral artery.  No aneurysm.  No large vessel occlusion, hemodynamically significant stenosis, abnormal luminal irregularity,  aneurysm.  Posterior circulation: Codominant vertebral arteries. Basilar artery is patent, with normal flow related enhancement of the main branch vessels. Normal flow related enhancement of the posterior cerebral arteries. Tiny bilateral posterior communicating arteries are present.  No large vessel occlusion, hemodynamically significant stenosis, abnormal luminal irregularity, aneurysm.  IMPRESSION: MRI head: Acute multifocal infarcts within RIGHT middle cerebral artery territory (including RIGHT basal ganglia). RIGHT anterior occipital likely within RIGHT middle cerebral artery territory/watershed. Limited blood sensitive sequence without lobar hematoma.  Mild white matter changes, independent of acute areas of ischemia suggest chronic small vessel ischemic disease.  MRA head: RIGHT internal carotid artery occlusion. Bilateral posterior communicating arteries and suspected tiny anterior communicating artery present. Diminutive RIGHT middle cerebral artery flow related enhancement. Recommend CT angiogram of the neck for further evaluation.  These results will be called to the ordering clinician or representative by the Radiologist Assistant, and communication documented in the PACS or zVision Dashboard.   Electronically Signed   By: Elon Alas   On: 07/06/2014 21:41       Medical Problem List and Plan: 1. 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  DVT Prophylaxis/Anticoagulation: Subcutaneous heparin. Monitor platelet counts have any signs of bleeding 3. Pain Management: Tylenol as needed 4. Tobacco abuse. Nicoderm patch. Provide counseling 5. Neuropsych: This patient is capable of making decisions on his  own behalf. 6. Skin/Wound Care: Routine skin checks 7. Fluids/Electrolytes/Nutrition: Strict I and O in follow of chemistries 8. Hyperlipidemia. Lipitor     Post Admission Physician  Evaluation: 1. Functional deficits secondary  to right MCA infarct . 2. Patient is admitted to receive collaborative, interdisciplinary care between the physiatrist, rehab nursing staff, and therapy team. 3. Patient's level of medical complexity and substantial therapy needs in context of that medical necessity cannot be provided at a lesser intensity of care such as a SNF. 4. Patient has experienced substantial functional loss from his/her baseline which was documented above under the "Functional History" and "Functional Status" headings.  Judging by the patient's diagnosis, physical exam, and functional history, the patient has potential for functional progress which will result in measurable gains while on inpatient rehab.  These gains will be of substantial and practical use upon discharge  in facilitating mobility and self-care at the household level. 5. Physiatrist will provide 24 hour management of medical needs as well as oversight of the therapy plan/treatment and provide guidance as appropriate regarding the interaction of the two. 6. 24 hour rehab nursing will assist with bladder management, bowel management, safety, skin/wound care, disease management, medication administration and patient education  and help integrate therapy concepts, techniques,education, etc. 7. PT will assess and treat for/with: Lower extremity strength, range of motion, stamina, balance, functional mobility, safety, adaptive techniques and equipment, NMR, visual-spatial awareness, cognitive perceptual awareness.   Goals are: supervision . 8. OT will assess and treat for/with: ADL's, functional mobility, safety, upper extremity strength, adaptive techniques and equipment, NMR, cognitive perceptual awareness, visual-spatial awareness.   Goals are: supervision. Therapy may proceed with showering this patient. 9. SLP will assess and treat for/with: cognition, communication, swallowing, education.  Goals are:  supervision 10. Case Management and Social Worker will assess and treat for  psychological issues and discharge planning. 11. Team conference will be held weekly to assess progress toward goals and to determine barriers to discharge. 12. Patient will receive at least 3 hours of therapy per day at least 5 days per week. 13. ELOS: 17-24 days       14. Prognosis:  excellent     Meredith Staggers, MD, [facility] 07/08/2014   07/08/2014

## 2014-07-08 NOTE — H&P (Signed)
Physical Medicine and Rehabilitation Admission H&P    Chief Complaint  Patient presents with  . Weakness  : HPI: Samuel Shaw is a 54 y.o. male with history of tobacco abuse, TIA 2 years ago, CVA late December with recent discharge from hospital in [place] where he was visiting and maintained on aspirin 325 mg daily. Patient lives with his wife in [place]. Presented 07/06/2014 with progressive left-sided weakness and slurred speech since recent discharge from the hospital. CT scan reviewed from outside hospital in December showed acute right basal ganglia infarct. There was possible right ICA stenosis identified on carotid ultrasound. MRI of the brain 07/06/2014 showed acute multifocal infarcts within right middle cerebral artery territory including right basal ganglia, right anterior occipital and right middle cerebral artery territory/ watershed. 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 CTA  of the neck showed right ICA occlusion 1 cm above its origin. Echocardiogram showed ejection fraction 60% no wall motion abnormalities. Patient did not receive TPA. Neurology follow-up currently maintained on aspirin therapy. Subcutaneous heparin added for DVT prophylaxis. Mechanical soft diet. Physical and Occupational therapy evaluations completed 07/07/2013 with recommendations of physical medicine rehabilitation consult  ROS Review of Systems  Neurological: Positive for dizziness and weakness.  All other systems reviewed and are negative    Past Medical History  Diagnosis Date  . Stroke    History reviewed. No pertinent past surgical history. History reviewed. No pertinent family history. Social History:  reports that he has been smoking.  He does not have any smokeless tobacco history on file. He reports that he does not drink alcohol or use illicit drugs. Allergies: No Known Allergies Medications Prior to Admission  Medication Sig Dispense Refill  . aspirin 325 MG  tablet Take 325 mg by mouth daily.    . folic acid (FOLVITE) 150 MCG tablet Take 400 mcg by mouth daily.    . nicotine (NICODERM CQ - DOSED IN MG/24 HOURS) 21 mg/24hr patch Place 21 mg onto the skin daily.    . Thiamine HCl (VITAMIN B-1) 250 MG tablet Take 250 mg by mouth daily.    . permethrin (ELIMITE) 5 % cream Apply from neck down at night after shower. Wash off in the morning. May repeat in 1 week if still symptomatic. (Patient not taking: Reported on 07/06/2014) 60 g 0    Home: Home Living Family/patient expects to be discharged to:: Inpatient rehab Living Arrangements: Spouse/significant other Available Help at Discharge: Family Type of Home: House Additional Comments: 6 steps to enter with rail on R; then 1 step into the house. 1 level home  Lives With: Spouse   Functional History: Prior Function Level of Independence: Independent Comments: prior to dec pt was independent and worked in [employer].  Functional Status:  Mobility: Bed Mobility Overal bed mobility: Needs Assistance Bed Mobility: Sit to Supine Supine to sit: Mod assist Sit to supine: Min guard General bed mobility comments: Cues to bring LLE onto the bed when going to supine but able to with steady A to intiate movement. Pt able to bring trunk up in bed from supine position without A Transfers Overall transfer level: Needs assistance Equipment used: 1 person hand held assist Transfers: Stand Pivot Transfers, Sit to/from Stand Sit to Stand: Mod assist Stand pivot transfers: Max assist, Mod assist, +2 safety/equipment (+2 for safety (RN)) General transfer comment: Cues for technique and hand/foot placement. Once in standing required manual and verbal facilitation for weightshifting to advance LE's to  perform the transfer Ambulation/Gait Ambulation/Gait assistance: +2 physical assistance General Gait Details: Need to be further assessed but pt declining due to just wanting to get back in bed. From limited  assessment during transfer back to bed, would need +2 in open space but could try using R rail for UE support and feel pt might do well with that to start with gait trials    ADL: ADL Overall ADL's : Needs assistance/impaired Eating/Feeding: NPO Grooming: Wash/dry hands, Wash/dry face, Oral care, Minimal assistance, Sitting Grooming Details (indicate cue type and reason): Pt requires cues to attempt to use L hand and VCs to problem solve how to do things diffferently with decreased use of L side.   Upper Body Bathing: Moderate assistance, Sitting Upper Body Bathing Details (indicate cue type and reason): assist with hand over hand using L side to wash R. Lower Body Bathing: Maximal assistance, Sit to/from stand Lower Body Bathing Details (indicate cue type and reason): assist in standing. Pt with very poor balance.  Cues to cross L leg over R to attempt to reach foot. Upper Body Dressing : Maximal assistance, Sitting Upper Body Dressing Details (indicate cue type and reason): educated on hemi dressing technique. Lower Body Dressing: Maximal assistance, Sit to/from stand Lower Body Dressing Details (indicate cue type and reason): poor standing balance and safety. Toilet Transfer: Moderate assistance, Stand-pivot, BSC Toileting- Clothing Manipulation and Hygiene: Maximal assistance, Sit to/from stand Toileting - Clothing Manipulation Details (indicate cue type and reason): Pt requires max assist +1 for toileting and cleaning self. Functional mobility during ADLs: Maximal assistance General ADL Comments: Pt requires great assist for adls due to L side weakness and decreased safety awareness.  Cognition: Cognition Overall Cognitive Status: Impaired/Different from baseline Arousal/Alertness: Awake/alert Orientation Level: Oriented X4 Attention: Sustained Sustained Attention: Impaired Sustained Attention Impairment: Verbal basic, Functional basic Memory: Impaired Memory Impairment:  Decreased recall of new information Awareness: Impaired Awareness Impairment: Intellectual impairment, Emergent impairment Problem Solving: Impaired Problem Solving Impairment: Functional basic Cognition Arousal/Alertness: Awake/alert Behavior During Therapy: Flat affect Overall Cognitive Status: Impaired/Different from baseline Area of Impairment: Awareness, Safety/judgement, Attention, Memory, Problem solving Current Attention Level: Sustained Safety/Judgement: Decreased awareness of safety, Decreased awareness of deficits Problem Solving: Difficulty sequencing, Requires verbal cues, Requires tactile cues General Comments: Pt requiring cues for safety with mobility and slightly impulsive when attempting to get up chair. Decreased awareness noted to L sided weakness  Physical Exam: Blood pressure 131/79, pulse 46, temperature 97.9 F (36.6 C), temperature source Oral, resp. rate 18, height _0  (1.854 m), weight 81 kg (178 lb 9.2 oz), SpO2 99 %. Physical Exam Constitutional: He appears well-developed.  HENT: oral mucosa pink Head: Normocephalic and atraumatic.  Left facial weakness  Eyes: EOM are normal. Scleral icterus is present.  Neck: Normal range of motion. Neck supple. No tracheal deviation present. No thyromegaly present.  Cardiovascular: Normal rate and regular rhythm.  Respiratory: Effort normal and breath sounds normal. No respiratory distress.  GI: Soft. Bowel sounds are normal. He exhibits no distension.  Musculoskeletal: Normal range of motion.  Neurological: He is alert.  Left central 7 and tongue deviation. Speech slightly slurred,.   MMT-- Right: 4+/5 deltoid, 4+/5 bicep, 4+/5 tricep, 4+/5 wrist extension, 4+/5 hand intrinsics. 4+/5 hip flexor, 4+/5 knee extension, 4+/5 ankle dorsiflexion, 4+/5 ankle plantarflexion. Left: 3/5 deltoid, 3+/5 bicep, 3+/5 tricep, 3+/5 wrist extension, 3+/5 hand intrinsics. 3/5 hip flexor, 3/5 knee extension, 3+/5 ankle  dorsiflexion, 3+/5 ankle plantarflexion. Mild sensory changes on the left?  Left inattention.   Patient is able to state his name and age. Follows simple commands  Skin: Skin is warm and dry.  Psychiatric:  Extremely flat. Remains slow to engage    Results for orders placed or performed during the hospital encounter of 07/06/14 (from the past 48 hour(s))  CBC     Status: None   Collection Time: 07/06/14 12:58 PM  Result Value Ref Range   WBC 5.7 4.0 - 10.5 K/uL   RBC 5.07 4.22 - 5.81 MIL/uL   Hemoglobin 16.2 13.0 - 17.0 g/dL   HCT 45.7 39.0 - 52.0 %   MCV 90.1 78.0 - 100.0 fL   MCH 32.0 26.0 - 34.0 pg   MCHC 35.4 30.0 - 36.0 g/dL   RDW 13.1 11.5 - 15.5 %   Platelets 191 150 - 400 K/uL  Differential     Status: None   Collection Time: 07/06/14 12:58 PM  Result Value Ref Range   Neutrophils Relative % 45 43 - 77 %   Neutro Abs 2.5 1.7 - 7.7 K/uL   Lymphocytes Relative 44 12 - 46 %   Lymphs Abs 2.6 0.7 - 4.0 K/uL   Monocytes Relative 9 3 - 12 %   Monocytes Absolute 0.5 0.1 - 1.0 K/uL   Eosinophils Relative 1 0 - 5 %   Eosinophils Absolute 0.1 0.0 - 0.7 K/uL   Basophils Relative 1 0 - 1 %   Basophils Absolute 0.0 0.0 - 0.1 K/uL  Comprehensive metabolic panel     Status: Abnormal   Collection Time: 07/06/14 12:58 PM  Result Value Ref Range   Sodium 136 135 - 145 mmol/L    Comment: Please note change in reference range.   Potassium 4.4 3.5 - 5.1 mmol/L    Comment: Please note change in reference range.   Chloride 104 96 - 112 mEq/L   CO2 24 19 - 32 mmol/L   Glucose, Bld 99 70 - 99 mg/dL   BUN 8 6 - 23 mg/dL   Creatinine, Ser 1.09 0.50 - 1.35 mg/dL   Calcium 9.6 8.4 - 10.5 mg/dL   Total Protein 7.0 6.0 - 8.3 g/dL   Albumin 4.2 3.5 - 5.2 g/dL   AST 21 0 - 37 U/L   ALT 18 0 - 53 U/L   Alkaline Phosphatase 53 39 - 117 U/L   Total Bilirubin 2.0 (H) 0.3 - 1.2 mg/dL   GFR calc non Af Amer 76 (L) >90 mL/min   GFR calc Af Amer 88 (L) >90 mL/min    Comment: (NOTE) The eGFR has  been calculated using the CKD EPI equation. This calculation has not been validated in all clinical situations. eGFR's persistently <90 mL/min signify possible Chronic Kidney Disease.    Anion gap 8 5 - 15  I-stat troponin, ED (not at [facility])     Status: None   Collection Time: 07/06/14  1:16 PM  Result Value Ref Range   Troponin i, poc 0.00 0.00 - 0.08 ng/mL   Comment 3            Comment: Due to the release kinetics of cTnI, a negative result within the first hours of the onset of symptoms does not rule out myocardial infarction with certainty. If myocardial infarction is still suspected, repeat the test at appropriate intervals.   APTT     Status: None   Collection Time: 07/06/14  2:20 PM  Result Value Ref Range   aPTT 29 24 - 37  seconds  Protime-INR     Status: None   Collection Time: 07/06/14  2:20 PM  Result Value Ref Range   Prothrombin Time 14.6 11.6 - 15.2 seconds   INR 1.13 0.00 - 1.49  Urinalysis, Routine w reflex microscopic     Status: Abnormal   Collection Time: 07/07/14 12:07 AM  Result Value Ref Range   Color, Urine AMBER (A) YELLOW    Comment: BIOCHEMICALS MAY BE AFFECTED BY COLOR   APPearance HAZY (A) CLEAR   Specific Gravity, Urine 1.026 1.005 - 1.030   pH 5.5 5.0 - 8.0   Glucose, UA NEGATIVE NEGATIVE mg/dL   Hgb urine dipstick NEGATIVE NEGATIVE   Bilirubin Urine SMALL (A) NEGATIVE   Ketones, ur NEGATIVE NEGATIVE mg/dL   Protein, ur NEGATIVE NEGATIVE mg/dL   Urobilinogen, UA 1.0 0.0 - 1.0 mg/dL   Nitrite NEGATIVE NEGATIVE   Leukocytes, UA NEGATIVE NEGATIVE    Comment: MICROSCOPIC NOT DONE ON URINES WITH NEGATIVE PROTEIN, BLOOD, LEUKOCYTES, NITRITE, OR GLUCOSE <1000 mg/dL.  Lipid panel     Status: Abnormal   Collection Time: 07/07/14  4:02 AM  Result Value Ref Range   Cholesterol 182 0 - 200 mg/dL   Triglycerides 116 <150 mg/dL   HDL 24 (L) >39 mg/dL   Total CHOL/HDL Ratio 7.6 RATIO   VLDL 23 0 - 40 mg/dL   LDL Cholesterol 135 (H) 0 - 99 mg/dL     Comment:        Total Cholesterol/HDL:CHD Risk Coronary Heart Disease Risk Table                     Men   Women  1/2 Average Risk   3.4   3.3  Average Risk       5.0   4.4  2 X Average Risk   9.6   7.1  3 X Average Risk  23.4   11.0        Use the calculated Patient Ratio above and the CHD Risk Table to determine the patient's CHD Risk.        ATP III CLASSIFICATION (LDL):  <100     mg/dL   Optimal  100-129  mg/dL   Near or Above                    Optimal  130-159  mg/dL   Borderline  160-189  mg/dL   High  >190     mg/dL   Very High   Hemoglobin A1c     Status: None   Collection Time: 07/07/14  4:02 AM  Result Value Ref Range   Hgb A1c MFr Bld 5.6 <5.7 %    Comment: (NOTE)                                                                       According to the ADA Clinical Practice Recommendations for 2011, when HbA1c is used as a screening test:  >=6.5%   Diagnostic of Diabetes Mellitus           (if abnormal result is confirmed) 5.7-6.4%   Increased risk of developing Diabetes Mellitus References:Diagnosis and Classification of Diabetes Mellitus,Diabetes WIOX,7353,29(JMEQA 1):S62-S69 and Standards of Medical Care in  Diabetes - 2011,Diabetes Care,2011,34 (Suppl 1):S11-S61.    Mean Plasma Glucose 114 <117 mg/dL    Comment: Performed at [facility]  T[facility]     Status: None   Collection Time: 07/07/14 12:10 PM  Result Value Ref Range   TSH 0.473 0.350 - 4.500 uIU/mL   Dg Chest 2 View  07/06/2014   CLINICAL DATA:  Increasing left-sided weakness. Previous stroke. Smoker.  EXAM: CHEST  2 VIEW  COMPARISON:  10/31/2008.  FINDINGS: Normal sized heart. Clear lungs. Increased density overlying the posterior aspect of the lower thoracic spine.  IMPRESSION: Possible mass overlying the posterior aspect of the lower thoracic spine. A chest CT with contrast is recommended.   Electronically Signed   By: Enrique Sack M.D.   On: 07/06/2014 15:54   Ct Head Wo  Contrast  07/06/2014   CLINICAL DATA:  Worsened left side weakness. The patient was recently diagnosed with stroke at an outside facility.  EXAM: CT HEAD WITHOUT CONTRAST  TECHNIQUE: Contiguous axial images were obtained from the base of the skull through the vertex without intravenous contrast.  COMPARISON:  Head CT scan and brain MRI 10/31/2008.  FINDINGS: Hypoattenuation in the anterior right MCA territory correlates with recent diagnosis of infarct. Scattered areas of hypoattenuation are also seen in the deep white matter with lacunar infarction which appears remote in the right internal capsule identified. No evidence of acute infarction, hemorrhage, mass lesion, mass effect, midline shift or abnormal extra-axial fluid collection is identified. There is no hydrocephalus or pneumocephalus. The calvarium is intact.  IMPRESSION: No acute finding.  Findings consistent with subacute to remote right MCA territory infarct. Chronic microvascular ischemic change also noted.   Electronically Signed   By: Inge Rise M.D.   On: 07/06/2014 14:57   Ct Angio Neck W/cm &/or Wo/cm  07/07/2014   CLINICAL DATA:  54 year old male with right hemispheric infarcts. Subsequent encounter.  EXAM: CT ANGIOGRAPHY NECK  TECHNIQUE: Multidetector CT imaging of the neck was performed using the standard protocol during bolus administration of intravenous contrast. Multiplanar CT image reconstructions and MIPs were obtained to evaluate the vascular anatomy. Carotid stenosis measurements (when applicable) are obtained utilizing NASCET criteria, using the distal internal carotid diameter as the denominator.  CONTRAST:  50 cc Omnipaque 350.  COMPARISON:  07/06/2014 brain MR. 11/03/2008 catheter angiogram.  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 Recommend annual imaging followup by CTA or MRA. This recommendation follows 2010 ACCF/AHA/AATS/ACR/ASA/SCA/SCAI/SIR/STS/SVM Guidelines for the Diagnosis and  Management of Patients With Thoracic Aortic Disease. Circulation.  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 Etiology of occlusion is indeterminate.  Left carotid system: Focal plaque proximal left internal carotid artery without measurable/ significant stenosis.  Vertebral arteries:Mild narrowing proximal right vertebral artery.  Other: Calcified mediastinal lymph nodes suggestive of prior granulomatous exposure. Small thyroid lesions more notable on the left measuring up to 1.2 cm. Dental disease. Subtle low-density structure within the left palatine tonsil may represent result of prior inflammation without discrete mass identified. Cervical spondylotic changes with spinal stenosis most notable C3-4 thru C5-6 level.  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 Etiology indeterminate.  Focal plaque proximal left internal carotid artery without measurable/significant stenosis.  Mild narrowing proximal right vertebral artery.  Slightly ectatic ascending thoracic aorta (3.4 cm) with follow-up as noted above.  Surrounding incidental findings as detailed above.  These results will be  called to the ordering clinician or representative by the Radiologist Assistant, and communication documented in the PACS or zVision Dashboard.   Electronically Signed   By: Chauncey Cruel M.D.   On: 07/07/2014 09:00   Ct Chest W Contrast  07/07/2014   CLINICAL DATA:  Question pulmonary mass on x-ray.  EXAM: CT CHEST WITH CONTRAST  TECHNIQUE: Multidetector CT imaging of the chest was performed during intravenous contrast administration.  CONTRAST:  80m OMNIPAQUE IOHEXOL 300 MG/ML  SOLN  COMPARISON:  Radiographs 1 day prior.  FINDINGS: Within the posterior right lower lobe there is a 2.3 x 1.8 cm nodule. There is a large amount of internal calcification. No definite macroscopic fat. This abuts the posterior pleura with associated pleural  thickening and likely compressive atelectasis. There are no additional pulmonary nodules. Mild apical predominant emphysema. Hypoventilatory atelectasis at the lung bases. No consolidation to suggest pneumonia.  Calcified lymph nodes in the subcarinal, lower paratracheal, and upper paratracheal stations. No noncalcified adenopathy. There is no pleural or pericardial effusion. The ascending thoracic aorta measures up to 3.4 cm in greatest dimension. The heart size is normal. There is a 10 mm hypodensity in the left lobe of thyroid gland.  Evaluation of the upper abdomen shows no acute abnormality. There is a 14 x 10 mm cyst adjacent to the gallbladder fossa in the liver. No acute osseous abnormality. No focal osseous lesion.  IMPRESSION: 1. Calcified 2.3 cm nodule in the posterior right lower lobe corresponding to questioned mass on radiograph. This may reflect pulmonary hamartoma versus less likely a large granuloma. There is adjacent pleural thickening and minimal atelectasis. 2. Calcified mediastinal lymph nodes, may reflect sequela of prior granulomatous disease. 3. Ectasia/mild aneurysmal dilatation of the ascending thoracic aorta, measures 3.4 cm. Recommend annual imaging followup by CTA or MRA. This recommendation follows 2010 ACCF/AHA/AATS/ACR/ASA/SCA/SCAI/SIR/STS/SVM Guidelines for the Diagnosis and Management of Patients With Thoracic Aortic Disease. Circulation.2010; 121:: W656-C127  Electronically Signed   By: MJeb LeveringM.D.   On: 07/07/2014 22:31   Mr Brain Wo Contrast  07/06/2014   CLINICAL DATA:  Progressive LEFT-sided weakness after stroke in late December (acute RIGHT basal ganglia infarct). History of transient ischemic attacks, and possible RIGHT internal carotid artery stenosis.  EXAM: MRI HEAD WITHOUT CONTRAST  MRA HEAD WITHOUT CONTRAST  TECHNIQUE: Multiplanar, multiecho pulse sequences of the brain and surrounding structures were obtained without intravenous contrast. Angiographic  images of the head were obtained using MRA technique without contrast.  COMPARISON:  CT of the head July 06, 2014 and MRI of the head Oct 31, 2008  FINDINGS: MRI HEAD FINDINGS  Patchy areas of reduced diffusion within the RIGHT frontal, anterior occipital lobes, as well as the subcentimeter foci of reduced diffusion within the RIGHT basal ganglia, 14 mm focus of reduced diffusion within the RIGHT globus pallidus. Reduced diffusion within all lesions consistent with synchronous ischemia. Blood sensitive sequences extremely motion degraded, limiting sensitivity for petechial hemorrhage, no lobar hematoma.  Areas of acute ischemia demonstrate bright FLAIR signal, superimposed scattered subcentimeter FLAIR T2 hyperintensities within the supratentorial brain. No midline shift. The ventricles and sulci are overall normal for patient's age.  No abnormal extra-axial fluid collections. Complete loss of the RIGHT internal carotid artery flow void, see below.  Mild paranasal sinus mucosal thickening without air-fluid levels. Ocular globes and orbital contents are unremarkable. Mastoid air cells are well aerated. No suspicious calvarial bone marrow signal. No abnormal sellar expansion. No cerebellar tonsillar ectopia.  MRA HEAD FINDINGS  Anterior circulation: Thready flow related enhancement within RIGHT cervical internal carotid artery, minimal flow related enhancement of RIGHT petrous, complete loss of the RIGHT cavernous and supra clinoid internal carotid artery flow related enhancement. LEFT cervical, petrous, cavernous and supra clinoid internal carotid artery demonstrates robust flow related enhancement.  Apparent retrograde flow related enhancement of the RIGHT carotid terminus. Tiny anterior communicating artery is likely present. Generalized diminutive flow related enhancement RIGHT middle cerebral artery, normal flow related enhancement of the anterior cerebral arteries and LEFT middle cerebral artery.  No aneurysm.   No large vessel occlusion, hemodynamically significant stenosis, abnormal luminal irregularity, aneurysm.  Posterior circulation: Codominant vertebral arteries. Basilar artery is patent, with normal flow related enhancement of the main branch vessels. Normal flow related enhancement of the posterior cerebral arteries. Tiny bilateral posterior communicating arteries are present.  No large vessel occlusion, hemodynamically significant stenosis, abnormal luminal irregularity, aneurysm.  IMPRESSION: MRI head: Acute multifocal infarcts within RIGHT middle cerebral artery territory (including RIGHT basal ganglia). RIGHT anterior occipital likely within RIGHT middle cerebral artery territory/watershed. Limited blood sensitive sequence without lobar hematoma.  Mild white matter changes, independent of acute areas of ischemia suggest chronic small vessel ischemic disease.  MRA head: RIGHT internal carotid artery occlusion. Bilateral posterior communicating arteries and suspected tiny anterior communicating artery present. Diminutive RIGHT middle cerebral artery flow related enhancement. Recommend CT angiogram of the neck for further evaluation.  These results will be called to the ordering clinician or representative by the Radiologist Assistant, and communication documented in the PACS or zVision Dashboard.   Electronically Signed   By: Elon Alas   On: 07/06/2014 21:41   Mr Jodene Nam Head/brain Wo Cm  07/06/2014   CLINICAL DATA:  Progressive LEFT-sided weakness after stroke in late December (acute RIGHT basal ganglia infarct). History of transient ischemic attacks, and possible RIGHT internal carotid artery stenosis.  EXAM: MRI HEAD WITHOUT CONTRAST  MRA HEAD WITHOUT CONTRAST  TECHNIQUE: Multiplanar, multiecho pulse sequences of the brain and surrounding structures were obtained without intravenous contrast. Angiographic images of the head were obtained using MRA technique without contrast.  COMPARISON:  CT of the head  July 06, 2014 and MRI of the head Oct 31, 2008  FINDINGS: MRI HEAD FINDINGS  Patchy areas of reduced diffusion within the RIGHT frontal, anterior occipital lobes, as well as the subcentimeter foci of reduced diffusion within the RIGHT basal ganglia, 14 mm focus of reduced diffusion within the RIGHT globus pallidus. Reduced diffusion within all lesions consistent with synchronous ischemia. Blood sensitive sequences extremely motion degraded, limiting sensitivity for petechial hemorrhage, no lobar hematoma.  Areas of acute ischemia demonstrate bright FLAIR signal, superimposed scattered subcentimeter FLAIR T2 hyperintensities within the supratentorial brain. No midline shift. The ventricles and sulci are overall normal for patient's age.  No abnormal extra-axial fluid collections. Complete loss of the RIGHT internal carotid artery flow void, see below.  Mild paranasal sinus mucosal thickening without air-fluid levels. Ocular globes and orbital contents are unremarkable. Mastoid air cells are well aerated. No suspicious calvarial bone marrow signal. No abnormal sellar expansion. No cerebellar tonsillar ectopia.  MRA HEAD FINDINGS  Anterior circulation: Thready flow related enhancement within RIGHT cervical internal carotid artery, minimal flow related enhancement of RIGHT petrous, complete loss of the RIGHT cavernous and supra clinoid internal carotid artery flow related enhancement. LEFT cervical, petrous, cavernous and supra clinoid internal carotid artery demonstrates robust flow related enhancement.  Apparent retrograde flow related enhancement of the RIGHT carotid terminus. Tiny anterior communicating artery  is likely present. Generalized diminutive flow related enhancement RIGHT middle cerebral artery, normal flow related enhancement of the anterior cerebral arteries and LEFT middle cerebral artery.  No aneurysm.  No large vessel occlusion, hemodynamically significant stenosis, abnormal luminal irregularity,  aneurysm.  Posterior circulation: Codominant vertebral arteries. Basilar artery is patent, with normal flow related enhancement of the main branch vessels. Normal flow related enhancement of the posterior cerebral arteries. Tiny bilateral posterior communicating arteries are present.  No large vessel occlusion, hemodynamically significant stenosis, abnormal luminal irregularity, aneurysm.  IMPRESSION: MRI head: Acute multifocal infarcts within RIGHT middle cerebral artery territory (including RIGHT basal ganglia). RIGHT anterior occipital likely within RIGHT middle cerebral artery territory/watershed. Limited blood sensitive sequence without lobar hematoma.  Mild white matter changes, independent of acute areas of ischemia suggest chronic small vessel ischemic disease.  MRA head: RIGHT internal carotid artery occlusion. Bilateral posterior communicating arteries and suspected tiny anterior communicating artery present. Diminutive RIGHT middle cerebral artery flow related enhancement. Recommend CT angiogram of the neck for further evaluation.  These results will be called to the ordering clinician or representative by the Radiologist Assistant, and communication documented in the PACS or zVision Dashboard.   Electronically Signed   By: Elon Alas   On: 07/06/2014 21:41       Medical Problem List and Plan: 1. 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  DVT Prophylaxis/Anticoagulation: Subcutaneous heparin. Monitor platelet counts have any signs of bleeding 3. Pain Management: Tylenol as needed 4. Tobacco abuse. Nicoderm patch. Provide counseling 5. Neuropsych: This patient is capable of making decisions on his  own behalf. 6. Skin/Wound Care: Routine skin checks 7. Fluids/Electrolytes/Nutrition: Strict I and O in follow of chemistries 8. Hyperlipidemia. Lipitor     Post Admission Physician  Evaluation: 1. Functional deficits secondary  to right MCA infarct . 2. Patient is admitted to receive collaborative, interdisciplinary care between the physiatrist, rehab nursing staff, and therapy team. 3. Patient's level of medical complexity and substantial therapy needs in context of that medical necessity cannot be provided at a lesser intensity of care such as a SNF. 4. Patient has experienced substantial functional loss from his/her baseline which was documented above under the "Functional History" and "Functional Status" headings.  Judging by the patient's diagnosis, physical exam, and functional history, the patient has potential for functional progress which will result in measurable gains while on inpatient rehab.  These gains will be of substantial and practical use upon discharge  in facilitating mobility and self-care at the household level. 5. Physiatrist will provide 24 hour management of medical needs as well as oversight of the therapy plan/treatment and provide guidance as appropriate regarding the interaction of the two. 6. 24 hour rehab nursing will assist with bladder management, bowel management, safety, skin/wound care, disease management, medication administration and patient education  and help integrate therapy concepts, techniques,education, etc. 7. PT will assess and treat for/with: Lower extremity strength, range of motion, stamina, balance, functional mobility, safety, adaptive techniques and equipment, NMR, visual-spatial awareness, cognitive perceptual awareness.   Goals are: supervision . 8. OT will assess and treat for/with: ADL's, functional mobility, safety, upper extremity strength, adaptive techniques and equipment, NMR, cognitive perceptual awareness, visual-spatial awareness.   Goals are: supervision. Therapy may proceed with showering this patient. 9. SLP will assess and treat for/with: cognition, communication, swallowing, education.  Goals are:  supervision 10. Case Management and Social Worker will assess and treat for  psychological issues and discharge planning. 11. Team conference will be held weekly to assess progress toward goals and to determine barriers to discharge. 12. Patient will receive at least 3 hours of therapy per day at least 5 days per week. 13. ELOS: 17-24 days       14. Prognosis:  excellent     Meredith Staggers, MD, [facility] 07/08/2014   07/08/2014

## 2014-07-08 NOTE — Progress Notes (Signed)
Rehab admissions - I am following pt's case and have received insurance approval for inpatient rehab. I received medical clearance from Dr. Waldron Labs and rehab bed is available. We will admit pt to inpatient rehab today.  I called and updated pt and his wife by phone and they are pleased with the plan. I will complete admission paperwork with pt/family shortly.  I left messages with Hassan Rowan, case Freight forwarder and Marjorie Smolder, social work as well.  Please call me with any questions. Thanks.  Nanetta Batty, PT Rehabilitation Admissions Coordinator [phone]

## 2014-07-08 NOTE — Interval H&P Note (Signed)
Samuel Shaw was admitted today to Inpatient Rehabilitation with the diagnosis of right MCA infarct.  The patient's history has been reviewed, patient examined, and there is no change in status.  Patient continues to be appropriate for intensive inpatient rehabilitation.  I have reviewed the patient's chart and labs.  Questions were answered to the patient's satisfaction.  [name] T 07/08/2014, 5:50 PM

## 2014-07-09 ENCOUNTER — Inpatient Hospital Stay (HOSPITAL_COMMUNITY): Payer: BLUE CROSS/BLUE SHIELD | Admitting: Physical Therapy

## 2014-07-09 ENCOUNTER — Inpatient Hospital Stay (HOSPITAL_COMMUNITY): Payer: BLUE CROSS/BLUE SHIELD | Admitting: Occupational Therapy

## 2014-07-09 ENCOUNTER — Encounter (HOSPITAL_COMMUNITY): Payer: Self-pay

## 2014-07-09 ENCOUNTER — Inpatient Hospital Stay (HOSPITAL_COMMUNITY): Payer: BLUE CROSS/BLUE SHIELD | Admitting: Speech Pathology

## 2014-07-09 LAB — CBC WITH DIFFERENTIAL/PLATELET
BASOS PCT: 0 % (ref 0–1)
Basophils Absolute: 0 10*3/uL (ref 0.0–0.1)
EOS ABS: 0.1 10*3/uL (ref 0.0–0.7)
EOS PCT: 3 % (ref 0–5)
HEMATOCRIT: 45.1 % (ref 39.0–52.0)
HEMOGLOBIN: 16 g/dL (ref 13.0–17.0)
Lymphocytes Relative: 63 % — ABNORMAL HIGH (ref 12–46)
Lymphs Abs: 3.5 10*3/uL (ref 0.7–4.0)
MCH: 31.7 pg (ref 26.0–34.0)
MCHC: 35.5 g/dL (ref 30.0–36.0)
MCV: 89.5 fL (ref 78.0–100.0)
MONO ABS: 0.5 10*3/uL (ref 0.1–1.0)
MONOS PCT: 8 % (ref 3–12)
Neutro Abs: 1.5 10*3/uL — ABNORMAL LOW (ref 1.7–7.7)
Neutrophils Relative %: 26 % — ABNORMAL LOW (ref 43–77)
Platelets: 208 10*3/uL (ref 150–400)
RBC: 5.04 MIL/uL (ref 4.22–5.81)
RDW: 13 % (ref 11.5–15.5)
WBC: 5.6 10*3/uL (ref 4.0–10.5)

## 2014-07-09 LAB — COMPREHENSIVE METABOLIC PANEL
ALBUMIN: 3.9 g/dL (ref 3.5–5.2)
ALT: 20 U/L (ref 0–53)
ANION GAP: 9 (ref 5–15)
AST: 23 U/L (ref 0–37)
Alkaline Phosphatase: 48 U/L (ref 39–117)
BUN: 9 mg/dL (ref 6–23)
CO2: 22 mmol/L (ref 19–32)
CREATININE: 1.09 mg/dL (ref 0.50–1.35)
Calcium: 9.3 mg/dL (ref 8.4–10.5)
Chloride: 106 mEq/L (ref 96–112)
GFR calc non Af Amer: 76 mL/min — ABNORMAL LOW (ref >90)
GFR, EST AFRICAN AMERICAN: 88 mL/min — AB (ref >90)
GLUCOSE: 109 mg/dL — AB (ref 70–99)
POTASSIUM: 4.1 mmol/L (ref 3.5–5.1)
Sodium: 137 mmol/L (ref 135–145)
TOTAL PROTEIN: 6.6 g/dL (ref 6.0–8.3)
Total Bilirubin: 1.2 mg/dL (ref 0.3–1.2)

## 2014-07-09 MED ORDER — FLEET ENEMA 7-19 GM/118ML RE ENEM: 1.0000 | ENEMA | Freq: Every day | RECTAL | Status: DC | PRN

## 2014-07-09 MED ORDER — BOOST / RESOURCE BREEZE PO LIQD
1.0000 | Freq: Three times a day (TID) | ORAL | Status: DC
  Administered 2014-07-09: 1 via ORAL

## 2014-07-09 MED ORDER — ENSURE COMPLETE PO LIQD: 237.0000 mL | Freq: Two times a day (BID) | ORAL | Status: DC

## 2014-07-09 NOTE — Progress Notes (Signed)
INITIAL NUTRITION ASSESSMENT  DOCUMENTATION CODES Per approved criteria  -Not Applicable   INTERVENTION: Provide Resource Breeze po TID, each supplement provides 250 kcal and 9 grams of protein.  Provide Magic cup TID with meals, each supplement provides 290 kcal and 9 grams of protein.  Provide nourishment snack ([place] sandwich). Ordered.  Encourage adequate PO intake.  NUTRITION DIAGNOSIS: Inadequate oral intake related to dislike of food as evidenced by meal completion of 25-65%.   Goal: Pt to meet >/= 90% of their estimated nutrition needs   Monitor:  PO intake, weight trends, labs, I/O's  Reason for Assessment: MST  54 y.o. male  Admitting Dx: Stroke  ASSESSMENT: Pt with history of tobacco abuse, TIA 2 years ago, CVA late December. Presented 07/06/2014 with progressive left-sided weakness and slurred speech. CT scan reviewed from outside hospital in December showed acute right basal ganglia infarct.  Spoke with Wife at bedside as pt was resting. She reports pt's appetite is fine, however he tends to be a "picky eater". She reports pt has not been eating much at meals. Meal completion is 25-65%. She reports pt usually eats 6 small meal a day. RD to order nourishment snacks in between meals ([place] sandwich). Pt was also agreeable to [employer] and [employer]. Will order. Weight has been stable, however wife reports usual body weight of 165 lbs. Question current recorded weight?  Unable to perform nutrition focused physical exam at this time as pt was tired and wanting to rest. Will perform next visit.   Labs and medication reviewed.  Height: Ht Readings from Last 1 Encounters:  07/08/14 6\' 2"  (1.88 m)    Weight: Wt Readings from Last 1 Encounters:  07/07/14 178 lb 9.2 oz (81 kg)    Ideal Body Weight: 190 lbs  % Ideal Body Weight: 94%  Wt Readings from Last 10 Encounters:  07/07/14 178 lb 9.2 oz (81 kg)    Usual Body Weight: 165 lbs  % Usual Body  Weight: 108%  BMI:  Body Mass Index: 22.95 kg/(m^2)  Estimated Nutritional Needs: Kcal: 2000-2200 Protein: 90-100 grams Fluid: 2 - 2.2 L/day  Skin: intact  Diet Order: DIET DYS 3  EDUCATION NEEDS: -No education needs identified at this time   Intake/Output Summary (Last 24 hours) at 07/09/14 1352 Last data filed at 07/09/14 0800  Gross per 24 hour  Intake    420 ml  Output    300 ml  Net    120 ml    Last BM: PTA  Labs:   Recent Labs Lab 07/06/14 1258 07/08/14 1725 07/09/14 0550  NA 136  --  137  K 4.4  --  4.1  CL 104  --  106  CO2 24  --  22  BUN 8  --  9  CREATININE 1.09 1.11 1.09  CALCIUM 9.6  --  9.3  GLUCOSE 99  --  109*    CBG (last 3)  No results for input(s): GLUCAP in the last 72 hours.  Scheduled Meds: . aspirin EC  325 mg Oral Daily  . atorvastatin  20 mg Oral q1800  . docusate sodium  100 mg Oral BID  . heparin  5,000 Units Subcutaneous 3 times per day  . nicotine  21 mg Transdermal Daily    Continuous Infusions:   Past Medical History  Diagnosis Date  . Stroke     History reviewed. No pertinent past surgical history.  Kallie Locks, MS, RD, LDN Pager # [phone] After  hours/ weekend pager # [phone]

## 2014-07-09 NOTE — Evaluation (Signed)
Occupational Therapy Assessment and Plan  Patient Details  Name: Samuel Shaw MRN: [id number] Date of Birth: 1960-10-17  OT Diagnosis: abnormal posture, altered mental status, cognitive deficits, hemiplegia affecting non-dominant side and muscle weakness (generalized) Rehab Potential: Rehab Potential (ACUTE ONLY): Excellent ELOS: 17-19 days   Today's Date: 07/09/2014 OT Individual Time: 0800-0900 OT Individual Time Calculation (min): 60 min     Problem List:  Patient Active Problem List   Diagnosis Date Noted  . 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 CVA (cerebral vascular accident)   . Tobacco abuse   . HLD (hyperlipidemia)   . CVA (cerebral infarction) 07/06/2014    Past Medical History:  Past Medical History  Diagnosis Date  . Stroke    Past Surgical History: History reviewed. No pertinent past surgical history.  Assessment & Plan Clinical Impression: Patient is a 54 y.o. year old male with recent admission to the hospital on 07/06/2014 with progressive left-sided weakness and slurred speech since recent discharge from the hospital. CT scan reviewed from outside hospital in December showed acute right basal ganglia infarct. There was possible right ICA stenosis identified on carotid ultrasound. MRI of the brain 07/06/2014 showed acute multifocal infarcts within right middle cerebral artery territory including right basal ganglia, right anterior occipital and right middle cerebral artery territory/ watershed. MRA of the head showed right ICA occlusion .  Patient transferred to CIR on 07/08/2014 .    Patient currently requires mod with basic self-care skills secondary to muscle weakness, impaired timing and sequencing, unbalanced muscle activation, decreased coordination and decreased motor planning, decreased attention to left and decreased motor planning, decreased initiation, decreased attention, decreased awareness, decreased problem solving,  decreased safety awareness, decreased memory and delayed processing and decreased sitting balance, decreased standing balance and hemiplegia.  Prior to hospitalization, patient could complete ADLs with independent .  Patient will benefit from skilled intervention to decrease level of assist with basic self-care skills, increase independence with basic self-care skills and increase level of independence with iADL prior to discharge home with care partner.  Anticipate patient will require 24 hour supervision and follow up outpatient.  OT - End of Session Activity Tolerance: Tolerates 30+ min activity with multiple rests Endurance Deficit: Yes Endurance Deficit Description: requires seated rest after mobiity OT Assessment Rehab Potential (ACUTE ONLY): Excellent OT Patient demonstrates impairments in the following area(s): Balance;Cognition;Endurance;Motor;Safety OT Basic ADL's Functional Problem(s): Eating;Grooming;Bathing;Dressing;Toileting OT Advanced ADL's Functional Problem(s): Simple Meal Preparation OT Transfers Functional Problem(s): Toilet;Tub/Shower OT Additional Impairment(s): Fuctional Use of Upper Extremity OT Plan OT Intensity: Minimum of 1-2 x/day, 45 to 90 minutes OT Frequency: 5 out of 7 days OT Duration/Estimated Length of Stay: 17-19 days OT Treatment/Interventions: Balance/vestibular training;DME/adaptive equipment instruction;Patient/family education;Therapeutic Activities;Discharge planning;Functional mobility training;Community reintegration;Cognitive remediation/compensation;Neuromuscular re-education;Disease mangement/prevention;Self Care/advanced ADL retraining;Therapeutic Exercise;UE/LE Strength taining/ROM;UE/LE Coordination activities OT Self Feeding Anticipated Outcome(s): modified independent OT Basic Self-Care Anticipated Outcome(s): supervision OT Toileting Anticipated Outcome(s): supervision OT Bathroom Transfers Anticipated Outcome(s): supervision OT  Recommendation Patient destination: Home Follow Up Recommendations: Outpatient OT Equipment Recommended: 3 in 1 bedside comode;Tub/shower bench   OT Evaluation Precautions/Restrictions  Precautions Precautions: Fall Restrictions Weight Bearing Restrictions: No  Pain Pain Assessment Pain Assessment: No/denies pain Home Living/Prior Functioning Home Living Living Arrangements: Spouse/significant other, Children Available Help at Discharge: Family (wife works, 4 yo daughter can assist) Type of Home: House Home Access: Stairs to enter Technical brewer of Steps: 6 Entrance Stairs-Rails: Left Home Layout: One level  Lives With: Spouse Prior  Function Level of Independence: Independent with gait, Independent with transfers, Independent with basic ADLs  Able to Take Stairs?: Yes Driving: Yes 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 4 TIAs in total since 2010, likes sports (basketball) ADL  See FIM scale for details  Vision/Perception  Vision- History Baseline Vision/History: No visual deficits Patient Visual Report: No change from baseline Vision- Assessment Vision Assessment?: Yes Eye Alignment: Within Functional Limits Ocular Range of Motion: Within Functional Limits Alignment/Gaze Preference: Head turned (Pt keeps head turned to the right at times when sitting in wheelchair) Tracking/Visual Pursuits: Able to track stimulus in all quads without difficulty Saccades: Within functional limits Convergence: Within functional limits Visual Fields: No apparent deficits Praxis Praxis-Other Comments: Pt with increased difficulty motor planning LUE and hand use, will continue to further assess in function.   Cognition Overall Cognitive Status: Impaired/Different from baseline Arousal/Alertness: Awake/alert Orientation Level: Oriented  X4 Attention: Sustained;Selective Sustained Attention: Impaired Sustained Attention Impairment: Functional basic Selective Attention: Impaired Memory: Impaired Memory Impairment: Decreased recall of new information Awareness: Impaired Awareness Impairment: Anticipatory impairment Problem Solving: Impaired Problem Solving Impairment: Functional basic Comments: Pt able to state reason for hospitalization and physical deficits from CVA.  Pt with delayed processing of verbal instructions when initiating basic bathing tasks.  Pt overall needing max instructional cueing to begin and complete sponge bathe EOB.   Sensation Sensation Light Touch: Appears Intact Stereognosis: Appears Intact Hot/Cold: Appears Intact Proprioception: Appears Intact Coordination Gross Motor Movements are Fluid and Coordinated: No Fine Motor Movements are Fluid and Coordinated: No Coordination and Movement Description: Pt needing mod assist for LUE use during bathing tasks secondary to decreased strength.  Pt with Brunnstrum stage V movement in the shoulder and arm with some isolated movements noted at the elbow and hand.  Heel Shin Test: able to initiate with LLE but unable to complete due to hemiplegia/weakness Motor  Motor Motor: Hemiplegia;Abnormal postural alignment and control Motor - Skilled Clinical Observations: Pt with mild left hemiparesis with increased lean to the left in standing.   Mild left in-attention also present will continue to further eval in function.  Mobility  Bed Mobility Bed Mobility: Supine to Sit Supine to Sit: HOB flat;4: Min assist;With rails Supine to Sit Details: Manual facilitation for weight shifting;Verbal cues for technique Sit to Supine: 5: Supervision;HOB flat Sit to Supine - Details: Verbal cues for technique;Verbal cues for sequencing Transfers Transfers: Sit to Stand;Stand to Sit Sit to Stand: 3: Mod assist;With upper extremity assist;From toilet;From bed Sit to Stand  Details: Verbal cues for technique;Manual facilitation for weight shifting Stand to Sit: With upper extremity assist;To toilet;To bed;To chair/3-in-1;3: Mod assist Stand to Sit Details (indicate cue type and reason): Manual facilitation for weight shifting;Verbal cues for technique  Trunk/Postural Assessment  Cervical Assessment Cervical Assessment: Within Functional Limits (Pt with slight head turn to the right at rest.) Thoracic Assessment Thoracic Assessment: Within Functional Limits Lumbar Assessment Lumbar Assessment: Within Functional Limits Postural Control Postural Control: Deficits on evaluation Protective Responses: Sits in a posterior pelvic tilt while engaged in selfcare tasks EOB.  Is able to achieve anterior pelvic tilt with verbal cueing.    Balance Balance Balance Assessed: Yes Static Sitting Balance Static Sitting - Balance Support: Feet supported;Right upper extremity supported Static Sitting - Level of Assistance: 6: Modified independent (Device/Increase time) Dynamic Sitting Balance Dynamic Sitting - Balance Support: No upper extremity supported Dynamic Sitting -  Level of Assistance: 4: Min assist Dynamic Sitting - Balance Activities: Other (comment) (during selfcare tasks) Sitting balance - Comments: Pt with LOB to the left and posteriorly during bathing and dressing when attempting to lift LEs up for washing and dressing.  Static Standing Balance Static Standing - Balance Support: Right upper extremity supported Static Standing - Level of Assistance: 3: Mod assist Dynamic Standing Balance Dynamic Standing - Balance Support: Right upper extremity supported Dynamic Standing - Level of Assistance: 2: Max assist Extremity/Trunk Assessment RUE Assessment RUE Assessment: Within Functional Limits LUE Assessment LUE Assessment: Exceptions to [facility] LUE Strength LUE Overall Strength Comments: Pt currently presents at a Brunnstrum stage V movement in the arm and hand.   Synergy pattern still present in the shoulder with some iloslated AROM noted in the elbow.  Gross digit flexion and extension present with pt being able to to oppose the thumb to the first digit but is not others.  He needs mod facilitation to use functionally with basic bathing tasks.    FIM:  FIM - Eating Eating Activity: 5: Set-up assist for open containers FIM - Grooming Grooming Steps: Wash, rinse, dry face Grooming: 3: Patient completes 2 of 4 or 3 of 5 steps FIM - Bathing Bathing Steps Patient Completed: Chest;Left Arm;Abdomen;Right upper leg;Left upper leg Bathing: 3: Mod-Patient completes 5-7 45f10 parts or 50-74% FIM - Upper Body Dressing/Undressing Upper body dressing/undressing: 4: Min-Patient completed 75 plus % of tasks FIM - Lower Body Dressing/Undressing Lower body dressing/undressing steps patient completed: Thread/unthread right underwear leg;Thread/unthread right pants leg Lower body dressing/undressing: 2: Max-Patient completed 25-49% of tasks FIM - Toileting Toileting: 1: Total-Patient completed zero steps, helper did all 3 FIM - Bed/Chair Transfer Bed/Chair Transfer Assistive Devices: Arm rests Bed/Chair Transfer: 4: Supine > Sit: Min A (steadying Pt. > 75%/lift 1 leg);3: Chair or W/C > Bed: Mod A (lift or lower assist) FIM - TRadio producerDevices: Bedside commode Toilet Transfers: 3-From toilet/BSC: Mod A (lift or lower assist);3-To toilet/BSC: Mod A (lift or lower assist) FIM - Tub/Shower Transfers Tub/shower Transfers: 0-Activity did not occur or was simulated   Refer to Care Plan for Long Term Goals  Recommendations for other services: None  Discharge Criteria: Patient will be discharged from OT if patient refuses treatment 3 consecutive times without medical reason, if treatment goals not met, if there is a change in medical status, if patient makes no progress towards goals or if patient is discharged from hospital.  The above  assessment, treatment plan, treatment alternatives and goals were discussed and mutually agreed upon: by patient   Pt performed bathing and dressing sit to stand at the EOB.  He was able to maintain sitting balance with close supervision but did demonstrate LOB posteriorly with dynamic balance on one occasion during bathing.  Mod assist for sit to stand and stand pivot transfer to the [facility]  Pt with decreased ability to maintain left knee extension in standing resulting in pt leaning to the left side.  He needed mod assist for integration of the RUE into functional tasks at this time.  Overall needs max step by step cueing for initiation to begin and complete all bathing.    , OTR/L 07/09/2014, 1:06 PM

## 2014-07-09 NOTE — Progress Notes (Signed)
[facility] Admission Coordinator Signed Physical Medicine and Rehabilitation PMR Pre-admission 07/08/2014 10:35 AM  Related encounter: ED to Hosp-Admission (Discharged) from 07/06/2014 in [place]   PMR Admission Coordinator Pre-Admission Assessment  Patient: Samuel Shaw is an 54 y.o., male MRN: [id number] DOB: [birth year] Height: 6' 1"  (185.4 cm) Weight: 81 kg (178 lb 9.2 oz)  Insurance Information HMO: PPO: yes PCP: IPA: 80/20: OTHER:  PRIMARY: [employer] [place] Policy#: [id number] Subscriber: self CM Name: Algis Downs, RN Phone#: [phone] Fax#: [phone] Approval given on 07-08-14 from [place] from 07-08-14 through 07-15-14 with updates due to Jenny Reichmann, RN on 07-14-14 to above fax Pre-Cert#: [id number] Employer: [employer] Benefits: Phone #: [phone] Name: Harolyn Rutherford. 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 male with history of tobacco abuse, TIA 2 years ago, CVA  late December with recent discharge from hospital in [place] where he was visiting and maintained on aspirin 325 mg daily. Patient lives with his wife in [place]. Presented 07/06/2014 with progressive left-sided weakness since recent discharge from the hospital. CT scan reviewed from outside hospital in December showed acute right basal ganglia infarct. There was possible right ICA stenosis identified on carotid ultrasound. MRI of the brain 07/06/2014 showed acute multifocal infarcts within right middle cerebral artery territory including right basal ganglia, right anterior occipital and right middle cerebral artery territory/ watershed. 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 CTA and GO of the neck showed right ICA occlusion 1 cm above its origin. Echocardiogram pending. Patient did not receive TPA. Neurology follow-up currently maintained on aspirin therapy. Subcutaneous heparin added for DVT prophylaxis. Mechanical soft diet. Occupational therapy evaluation completed 07/07/2013 with recommendations of physical medicine rehabilitation consult.  NIH Total: 2  Past Medical History  Past Medical History  Diagnosis Date  . Stroke     Family History  family history is not on file.  Prior Rehab/Hospitalizations: pt was hospitalized in late December for CVA in [place] and received limited acute therapy per wife. He has had no other previous rehab.  Current Medications  Current facility-administered medications: stroke: mapping our early stages of recovery book, , Does not apply, Once, Phillips Climes, MD; acetaminophen (TYLENOL) tablet 650 mg, 650 mg, Oral, Q6H PRN, Gardiner Barefoot, NP, 650 mg at 07/08/14 0241; aspirin EC tablet 325 mg, 325 mg, Oral, Daily, Maryann Mikhail, DO, 325 mg at 07/08/14 0258; atorvastatin (LIPITOR) tablet 20 mg, 20 mg, Oral, q1800, Maryann Mikhail, DO, 20 mg at 07/07/14 1753 docusate sodium (COLACE) capsule 100 mg, 100 mg, Oral, BID,  Maryann Mikhail, DO, 100 mg at 07/08/14 0929; heparin injection 5,000 Units, 5,000 Units, Subcutaneous, 3 times per day, Phillips Climes, MD, 5,000 Units at 07/08/14 0533; nicotine (NICODERM CQ - dosed  in mg/24 hours) patch 21 mg, 21 mg, Transdermal, Daily, Phillips Climes, MD, 21 mg at 07/08/14 0930  Patients Current Diet: DIET DYS 3, thin liquids, full supervision, meds with puree  Precautions / Restrictions Precautions Precautions: Fall Restrictions Weight Bearing Restrictions: No   Prior Activity Level Community (5-7x/wk): Prior to CVA in late Dec., pt was working full time in Landscape [employer]. He enjoys sports and his family.   Home Assistive Devices / Equipment Home Assistive Devices/Equipment: None  Prior Functional Level Prior Function Level of Independence: Independent Comments: prior to dec pt was independent and worked in [employer].  Current Functional Level Cognition  Arousal/Alertness: Awake/alert Overall Cognitive Status: Impaired/Different from baseline Current Attention Level: Sustained Orientation Level: Oriented X4 Safety/Judgement: Decreased awareness of safety, Decreased awareness of deficits General Comments: Slightly impulsive with inability to recall deficits.  Attention: Sustained Sustained Attention: Impaired Sustained Attention Impairment: Verbal basic, Functional basic Memory: Impaired Memory Impairment: Decreased recall of new information Awareness: Impaired Awareness Impairment: Intellectual impairment, Emergent impairment Problem Solving: Impaired Problem Solving Impairment: Functional basic   Extremity Assessment (includes Sensation/Coordination)          ADLs  Overall ADL's : Needs assistance/impaired Eating/Feeding: NPO Grooming: Wash/dry hands, Wash/dry face, Oral care, Minimal assistance, Sitting Grooming Details (indicate cue type and reason): Pt requires cues to attempt to use L hand and VCs to problem solve how to  do things diffferently with decreased use of L side.  Upper Body Bathing: Moderate assistance, Sitting Upper Body Bathing Details (indicate cue type and reason): assist with hand over hand using L side to wash R. Lower Body Bathing: Maximal assistance, Sit to/from stand Lower Body Bathing Details (indicate cue type and reason): assist in standing. Pt with very poor balance. Cues to cross L leg over R to attempt to reach foot. Upper Body Dressing : Maximal assistance, Sitting Upper Body Dressing Details (indicate cue type and reason): educated on hemi dressing technique. Lower Body Dressing: Maximal assistance, Sit to/from stand Lower Body Dressing Details (indicate cue type and reason): poor standing balance and safety. Toilet Transfer: Moderate assistance, Stand-pivot, BSC Toileting- Clothing Manipulation and Hygiene: Maximal assistance, Sit to/from stand Toileting - Clothing Manipulation Details (indicate cue type and reason): Pt requires max assist +1 for toileting and cleaning self. Functional mobility during ADLs: Maximal assistance General ADL Comments: Pt requires great assist for adls due to L side weakness and decreased safety awareness.    Mobility  Overal bed mobility: Needs Assistance Bed Mobility: Sit to Supine Supine to sit: Mod assist Sit to supine: Min guard General bed mobility comments: A for Les and trunk support into sitting. Cues for positioning    Transfers  Overall transfer level: Needs assistance Equipment used: 1 person hand held assist Transfers: Stand Pivot Transfers, Sit to/from Stand Sit to Stand: Mod assist Stand pivot transfers: Max assist, Mod assist, +2 safety/equipment (+2 for safety (RN)) General transfer comment: Cues for technique and hand/foot placement. "bear hug" technique utilized for standing to enable knee blocking.     Ambulation / Gait / Stairs / Wheelchair Mobility  Ambulation/Gait Ambulation/Gait assistance: +2 physical  assistance, Mod assist, Max assist Ambulation Distance (Feet): 10 Feet (x3) Assistive device: 2 person hand held assist Gait Pattern/deviations: Step-to pattern, Decreased dorsiflexion - right, Decreased dorsiflexion - left, Decreased weight shift to right, Narrow base of support, Trunk flexed Gait velocity interpretation: Below normal speed for age/gender General Gait Details: +2 asssit to weight shift and advance BLE. Very increased knee flexion/buckling on  L LE. Use of rail in hallway on the R for ambulatoin. Cues to attempt to keep upright posture and weightshifting.    Posture / Balance Dynamic Sitting Balance Sitting balance - Comments: Pt with L lean    Special needs/care consideration BiPAP/CPAP no CPM no  Continuous Drip IV no  Dialysis no  Life Vest no  Oxygen no  Special Bed no  Trach Size no  Wound Vac (area) no  Skin - no issues  Bowel mgmt: last BM on 07-03-14 Bladder mgmt: currently using urinal but wife says he is often "getting the urinal too late" Diabetic mgmt no   Previous Home Environment Living Arrangements: Spouse/significant other Lives With: Spouse Available Help at Discharge: Family Type of Home: House Home Care Services: No Additional Comments: 6 steps to enter with rail on R; then 1 step into the house. 1 level home  Discharge Living Setting Plans for Discharge Living Setting: Patient's home Type of Home at Discharge: House Discharge Home Layout: One level Discharge Home Access: Stairs to enter Entrance Stairs-Rails: right Entrance Stairs-Number of Steps: 6 steps, then 1 more step into house Does the patient have any problems obtaining your medications?: No  Social/Family/Support Systems Patient Roles: Spouse, Parent, Other (Comment) (works full time in Landscape architect, enjoys sports) Sport and exercise psychologist Information: wife [name] is primary contact Anticipated Caregiver: wife and dtr  Gonzella Lex Anticipated Caregiver's Contact Information: see above Ability/Limitations of Caregiver: [place] but dtr will care for pt during the day. Other family members also supportive. Caregiver Availability: 24/7 Discharge Plan Discussed with Primary Caregiver: Yes Is Caregiver In Agreement with Plan?: Yes Does Caregiver/Family have Issues with Lodging/Transportation while Pt is in Rehab?: No (wife states that someone will always be staying with pt.)  Goals/Additional Needs Patient/Family Goal for Rehab: Supervision with PT, OT and SLP Expected length of stay: 16-23 days Cultural Considerations: attends a [employer] Dietary Needs: dys 3, full supervision, thin liquids, meds with puree Equipment Needs: to be determined Pt/Family Agrees to Admission and willing to participate: Yes Program Orientation Provided & Reviewed with Pt/Caregiver Including Roles & Responsibilities: Yes   Decrease burden of Care through IP rehab admission: NA   Possible need for SNF placement upon discharge: not anticipated  Patient Condition: This patient's condition remains as documented in the consult dated 07-07-14, in which the Rehabilitation Physician determined and documented that the patient's condition is appropriate for intensive rehabilitative care in an inpatient rehabilitation facility. Will admit to inpatient rehab today.  Preadmission Screen Completed By: Nanetta Batty, PT, 07/08/2014 11:13 AM ______________________________________________________________________  Discussed status with Dr. Naaman Plummer on 07-08-14 at 1113 and received telephone approval for admission today.  Admission Coordinator: Nanetta Batty, PT, time1113/Date 07-08-14          Cosigned by: Meredith Staggers, MD at 07/08/2014 1:26 PM  Revision History     Date/Time User Provider Type Action   07/08/2014 1:26 PM Meredith Staggers, MD Physician Cosign   07/08/2014 11:19 AM [facility]  Rehab Admission Coordinator Sign

## 2014-07-09 NOTE — Progress Notes (Signed)
Pt last BM 12-31. Discussed with pt and wife importance of moving bowels frequently and need for laxative. Encouraged pt to have enema with pt refusing mutliple times. Attempted sorbitol this morning with pt only having intake of 84ml. Discussed with pt and wife about enema with wife stating, "Lets give him one more day and if he doesn't go we will give the enema tomorrow night."  Encouraged to have enema tonight with pt refusing. Will cont. To monitor and educate.

## 2014-07-09 NOTE — Evaluation (Signed)
Speech Language Pathology Assessment and Plan  Patient Details  Name: Samuel Shaw MRN: [id number] Date of Birth: 1961/01/18  SLP Diagnosis: Cognitive Impairments;Dysphagia  Rehab Potential: Excellent ELOS: 18-20 days    Today's Date: 07/09/2014 SLP Individual Time: 1400-1500 SLP Individual Time Calculation (min): 60 min   Problem List:  Patient Active Problem List   Diagnosis Date Noted  . 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 CVA (cerebral vascular accident)   . Tobacco abuse   . HLD (hyperlipidemia)   . CVA (cerebral infarction) 07/06/2014   Past Medical History:  Past Medical History  Diagnosis Date  . Stroke    Past Surgical History: History reviewed. No pertinent past surgical history.  Assessment / Plan / Recommendation Clinical Impression Patient is a 54 y.o. male with history of tobacco abuse, TIA 2 years ago, CVA late December with recent discharge from hospital in [place] where he was visiting and maintained on aspirin 325 mg daily. Patient lives with his wife in [place]. Presented 07/06/2014 with progressive left-sided weakness and slurred speech since recent discharge from the hospital. CT scan reviewed from outside hospital in December showed acute right basal ganglia infarct. There was possible right ICA stenosis identified on carotid ultrasound. MRI of the brain 07/06/2014 showed acute multifocal infarcts within right middle cerebral artery territory including right basal ganglia, right anterior occipital and right middle cerebral artery territory/ watershed. 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 CTA  of the neck showed right ICA occlusion 1 cm above its origin. Echocardiogram showed ejection fraction 60% no wall motion abnormalities. Patient transferred to CIR on 07/08/2014 and demonstrates mild-moderate cognitive impairments characterized by decreased problem solving, sustained attention, attention to left,  working memory, intellectual awareness of cognitive deficits and safety awareness which impacts patient's overall safety with functional and familiar tasks. Patient was also administered a BSE and demonstrated mild left-sided weakness and decreased sensation, resulting in left buccal pocketing of Dys.3 textures which cleared with min verbal cues for use of a lingual sweep. Patient also demonstrated delayed cough X 1 out of multiple trials. Recommend patient continue current diet of Dys 3 textures with thin liquids with full supervision for monitoring of oral residuals. Patient will benefit from skilled SLP intervention to maximize his cognitive and swallowing function in order to maximize his overall functional independence prior to discharge.   Skilled Therapeutic Interventions          Administered a cognitive-linguistic evaluation and BSE. Please see above for details.   SLP Assessment  Patient will need skilled Speech Lanaguage Pathology Services during CIR admission    Recommendations  Diet Recommendations: Dysphagia 3 (Mechanical Soft);Thin liquid Liquid Administration via: Cup;Straw Medication Administration: Whole meds with puree Supervision: Patient able to self feed;Full supervision/cueing for compensatory strategies Compensations: Slow rate;Small sips/bites Postural Changes and/or Swallow Maneuvers: Seated upright 90 degrees Oral Care Recommendations: Oral care BID Recommendations for Other Services: Neuropsych consult Patient destination: Home Follow up Recommendations: Home Health SLP;Outpatient SLP;24 hour supervision/assistance Equipment Recommended: None recommended by SLP    SLP Frequency 5 out of 7 days   SLP Treatment/Interventions Cognitive remediation/compensation;Cueing hierarchy;Dysphagia/aspiration precaution training;Environmental controls;Functional tasks;Internal/external aids;Patient/family education;Therapeutic Activities    Pain No/Denies Pain  Short Term  Goals: Week 1: SLP Short Term Goal 1 (Week 1): Patient will attend to left field of enviornment during functional tasks with Min A multimodal cues.  SLP Short Term Goal 2 (Week 1): Patient will demonstrate functional problem solving for  basic and familiar tasks with supervision multimodal cues.  SLP Short Term Goal 3 (Week 1): Patient will sustain attention to a functional task for 15 minutes with Min A multimodal cues for redirection.  SLP Short Term Goal 4 (Week 1): Patient will identify 1 cognitive deficit with Min A multimodal cues.  SLP Short Term Goal 5 (Week 1): Patient will utilize call bell to express wants/needs with supervision multimodal cues.  SLP Short Term Goal 6 (Week 1): Patient will consume current diet with minimal overt s/s of aspiration with supervision multimodal cues for use of swallow strategies .  See FIM for current functional status Refer to Care Plan for Long Term Goals  Recommendations for other services: Neuropsych  Discharge Criteria: Patient will be discharged from SLP if patient refuses treatment 3 consecutive times without medical reason, if treatment goals not met, if there is a change in medical status, if patient makes no progress towards goals or if patient is discharged from hospital.  The above assessment, treatment plan, treatment alternatives and goals were discussed and mutually agreed upon: by patient and by family  Chasen Mendell 07/09/2014, 3:24 PM

## 2014-07-09 NOTE — Progress Notes (Signed)
Pt had no void throughout the night. When rounding with night nurse, pt stated he did not need to use the urinal. I discussed need to empty bladder and I&O cath. Pt refused and resistant. Pt also refused to attempt use of urinal. At 0700- Bladder scan >400cc with I&O cath result 300cc. Pt and wife educated on need for I&O cath and emptying bladder. Encourage fluid intake.

## 2014-07-09 NOTE — Evaluation (Signed)
Physical Therapy Assessment and Plan  Patient Details  Name: Samuel Shaw MRN: [id number] Date of Birth: 04-27-1961  PT Diagnosis: Abnormal posture, Abnormality of gait, Cognitive deficits, Hemiplegia non-dominant and Muscle weakness Rehab Potential: Good ELOS: 18-20 days   Today's Date: 07/09/2014 PT Individual Time: 0930-1030 PT Individual Time Calculation (min): 60 min    Problem List:  Patient Active Problem List   Diagnosis Date Noted  . 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 CVA (cerebral vascular accident)   . Tobacco abuse   . HLD (hyperlipidemia)   . CVA (cerebral infarction) 07/06/2014    Past Medical History:  Past Medical History  Diagnosis Date  . Stroke    Past Surgical History: History reviewed. No pertinent past surgical history.  Assessment & Plan Clinical Impression: [name] is a 54 y.o. male with history of tobacco abuse, TIA 2 years ago, CVA late December with recent discharge from hospital in [place] where he was visiting and maintained on aspirin 325 mg daily. Patient lives with his wife in [place]. Presented 07/06/2014 with progressive left-sided weakness and slurred speech since recent discharge from the hospital. CT scan reviewed from outside hospital in December showed acute right basal ganglia infarct. There was possible right ICA stenosis identified on carotid ultrasound. MRI of the brain 07/06/2014 showed acute multifocal infarcts within right middle cerebral artery territory including right basal ganglia, right anterior occipital and right middle cerebral artery territory/ watershed. 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 CTA of the neck showed right ICA occlusion 1 cm above its origin. Echocardiogram showed ejection fraction 60% no wall motion abnormalities. Patient did not receive TPA. Neurology follow-up currently maintained on aspirin therapy. Subcutaneous heparin added for DVT  prophylaxis. Mechanical soft diet. Patient transferred to CIR on 07/08/2014.   Patient currently requires max with mobility secondary to muscle weakness, decreased cardiorespiratoy endurance, impaired timing and sequencing, abnormal tone and unbalanced muscle activation and decreased midline orientation and decreased attention to left.  Prior to hospitalization, patient was independent  with mobility and lived with Spouse in a House home.  Home access is 6Stairs to enter.  Patient will benefit from skilled PT intervention to maximize safe functional mobility, minimize fall risk and decrease caregiver burden for planned discharge home with 24 hour supervision.  Anticipate patient will benefit from follow up OP at discharge.  PT - End of Session Activity Tolerance: Tolerates 30+ min activity with multiple rests Endurance Deficit: Yes Endurance Deficit Description: requires seated rest after mobiity PT Assessment Rehab Potential (ACUTE/IP ONLY): Good Barriers to Discharge: Inaccessible home environment Barriers to Discharge Comments: 6 steps to enter home PT Patient demonstrates impairments in the following area(s): Balance;Behavior;Endurance;Motor;Perception;Safety PT Transfers Functional Problem(s): Bed Mobility;Bed to Chair;Car;Furniture;Floor PT Locomotion Functional Problem(s): Ambulation;Wheelchair Mobility;Stairs PT Plan PT Intensity: Minimum of 1-2 x/day ,45 to 90 minutes PT Frequency: 5 out of 7 days PT Duration Estimated Length of Stay: 18-20 days PT Treatment/Interventions: Ambulation/gait training;Balance/vestibular training;Discharge planning;Disease management/prevention;DME/adaptive equipment instruction;Functional mobility training;Neuromuscular re-education;Patient/family education;Stair training;Splinting/orthotics;Psychosocial support;Therapeutic Activities;Therapeutic Exercise;UE/LE Strength taining/ROM;UE/LE Coordination activities;Wheelchair propulsion/positioning PT Transfers  Anticipated Outcome(s): supervision PT Locomotion Anticipated Outcome(s): supervision PT Recommendation Recommendations for Other Services: Neuropsych consult Follow Up Recommendations: Outpatient PT;24 hour supervision/assistance Patient destination: Home Equipment Recommended: To be determined  Skilled Therapeutic Intervention Skilled therapeutic intervention initiated after completion of evaluation. Discussed with patient falls risk, safety within room, and focus of therapy during stay. Discussed possible LOS, goals, and f/u therapy. 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 Trialled ambulation with 3 musketeers technique x 40 ft with mod A x 2 and using hemiwalker and L DF wrap assist with max A x 1 and +2 assist for w/c follow. Pt requesting to return to bed at end of session, performed squat pivot transfer with mod A and left semi reclined in bed with call bell within reach and nurse and wife present.   PT Evaluation Precautions/Restrictions Precautions Precautions: Fall Restrictions Weight Bearing Restrictions: No General Chart Reviewed: Yes Family/Caregiver Present: Yes (wife) Vital Signs Pain Pain Assessment Pain Assessment: No/denies pain Home Living/Prior Functioning Home Living Available Help at Discharge: Family (wife works, 30 yo daughter can assist) Type of Home: House Home Access: Stairs to enter Entrance Stairs-Number of Steps: 6 Entrance Stairs-Rails: Left Home Layout: One level  Lives With: Spouse Prior Function Level of Independence: Independent with gait;Independent with transfers;Independent with basic ADLs  Able to Take Stairs?: Yes Driving: Yes Vocation: Full time employment Vocation Requirements: operating heavy machinery Leisure: Hobbies-yes (Comment) Comments: 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 4 TIAs in total since 2010, likes sports (basketball) Vision/Perception  Vision -  Assessment Eye Alignment: Within Functional Limits Ocular Range of Motion: Within Functional Limits Alignment/Gaze Preference: Head turned (Pt keeps head turned to the right at times when sitting in wheelchair) Tracking/Visual Pursuits: Able to track stimulus in all quads without difficulty Saccades: Within functional limits Convergence: Within functional limits Praxis Praxis-Other Comments: Pt with increased difficulty motor planning LUE and hand use, will continue to further assess in function.   Cognition Overall Cognitive Status: Impaired/Different from baseline Arousal/Alertness: Awake/alert Orientation Level: Oriented X4 Attention: Sustained;Selective Sustained Attention: Impaired Sustained Attention Impairment: Functional basic Selective Attention: Impaired Memory: Impaired Memory Impairment: Decreased recall of new information Awareness: Impaired Awareness Impairment: Anticipatory impairment Problem Solving: Impaired Problem Solving Impairment: Functional basic Comments: Pt able to state reason for hospitalization and physical deficits from CVA.  Pt with delayed processing of verbal instructions when initiating basic bathing tasks.  Pt overall needing max instructional cueing to begin and complete sponge bathe EOB.   Sensation Sensation Light Touch: Appears Intact Stereognosis: Appears Intact Hot/Cold: Appears Intact Proprioception: Appears Intact Coordination Gross Motor Movements are Fluid and Coordinated: No Fine Motor Movements are Fluid and Coordinated: No Heel Shin Test: able to initiate with LLE but unable to complete due to hemiplegia/weakness Motor  Motor Motor: Abnormal postural alignment and control;Hemiplegia Motor - Skilled Clinical Observations: L UE and LE hemiplegia, R gaze preference, L inattention  Mobility Bed Mobility Bed Mobility: Sit to Supine Sit to Supine: 5: Supervision;HOB flat Sit to Supine - Details: Verbal cues for technique;Verbal cues  for sequencing Transfers Transfers: Yes Sit to Stand: 4: Min assist;3: Mod assist;With upper extremity assist;With armrests;From chair/3-in-1 Sit to Stand Details: Verbal cues for technique;Verbal cues for sequencing;Verbal cues for precautions/safety Stand to Sit: With upper extremity assist;To chair/3-in-1;With armrests;4: Min assist Stand to Sit Details (indicate cue type and reason): Verbal cues for technique;Verbal cues for precautions/safety Squat Pivot Transfers: 3: Mod assist;With armrests Squat Pivot Transfer Details: Verbal cues for technique;Verbal cues for sequencing;Manual facilitation for weight shifting Locomotion  Ambulation Ambulation: Yes Ambulation/Gait Assistance: 1: +2 Total assist (+3 for w/c follow with 3 musketeers technique and +2 for w/c follow with use of hemiwalker) Ambulation Distance (Feet): 40 Feet (and 30 ft) Assistive device: 2 person hand held assist;Hemi-walker;Other (Comment) (3 musketeers technique with first trial then hemiwalker with   second trial, L DF wrap assist) Ambulation/Gait Assistance Details: Tactile cues for initiation;Verbal cues for sequencing;Verbal cues for technique;Verbal cues for gait pattern;Verbal cues for safe use of DME/AE;Manual facilitation for weight shifting;Manual facilitation for placement Ambulation/Gait Assistance Details: providing downward approximation to L femur to facilitate knee ext during stance phase Gait Gait: Yes Gait Pattern: Impaired Gait Pattern: Step-to pattern;Step-through pattern;Decreased step length - right;Decreased step length - left;Decreased stance time - left;Decreased dorsiflexion - left;Decreased weight shift to left;Left foot flat;Left flexed knee in stance;Lateral trunk lean to left;Decreased trunk rotation;Trunk flexed;Narrow base of support Gait velocity: decreased Stairs / Additional Locomotion Stairs: Yes Stairs Assistance: 2: Max Industrial/product designer Assistance Details: Verbal cues for  technique;Verbal cues for sequencing;Manual facilitation for placement Stairs Assistance Details (indicate cue type and reason): pt able to advance LLE on to step 30% of time, L DF wrap assist to help with toe clearance Stair Management Technique: One rail Right;Forwards;Step to pattern Number of Stairs: 5 Height of Stairs: 6 Ramp: Not tested (comment) Curb: Not tested (comment) Architect: Yes Wheelchair Assistance: 5: Supervision Wheelchair Assistance Details: Verbal cues for technique;Verbal cues for Information systems manager: Right upper extremity;Right lower extremity Wheelchair Parts Management: Needs assistance Distance: 150 ft  Trunk/Postural Assessment  Cervical Assessment Cervical Assessment: Exceptions to [facility] (Rotated R with R gaze preference) Thoracic Assessment Thoracic Assessment: Within Functional Limits Lumbar Assessment Lumbar Assessment: Within Functional Limits Postural Control Postural Control: Deficits on evaluation Protective Responses: impaired/delayed  Balance Balance Balance Assessed: Yes Static Sitting Balance Static Sitting - Balance Support: Feet supported Static Sitting - Level of Assistance: 6: Modified independent (Device/Increase time) Dynamic Sitting Balance Dynamic Sitting - Balance Support: Feet supported Dynamic Sitting - Level of Assistance: 5: Stand by assistance Static Standing Balance Static Standing - Balance Support: During functional activity;Right upper extremity supported Static Standing - Level of Assistance: 4: Min assist Dynamic Standing Balance Dynamic Standing - Balance Support: During functional activity;Right upper extremity supported Dynamic Standing - Level of Assistance: 2: Max assist Extremity Assessment  RUE Assessment RUE Assessment: Within Functional Limits LUE Assessment LUE Assessment: Exceptions to [facility] LUE Strength LUE Overall Strength Comments: Pt currently presents  at a Brunnstrum stage V movement in the arm and hand.  Synergy pattern still present in the shoulder with some iloslated AROM noted in the elbow.  Gross digit flexion and extension present with pt being able to to oppose the thumb to the first digit but is not others.  He needs mod facilitation to use functionally with basic bathing tasks.   RLE Assessment RLE Assessment: Within Functional Limits LLE Assessment LLE Assessment: Exceptions to [facility] LLE Strength LLE Overall Strength: Deficits LLE Overall Strength Comments: 3-/5 hip flexion and knee flexion, 4/5 knee extension, no active ankle DF noted  FIM:  FIM - Bed/Chair Transfer Bed/Chair Transfer Assistive Devices: Arm rests Bed/Chair Transfer: 3: Chair or W/C > Bed: Mod A (lift or lower assist);4: Sit > Supine: Min A (steadying pt. > 75%/lift 1 leg) FIM - Locomotion: Wheelchair Distance: 150 ft Locomotion: Wheelchair: 5: Travels 150 ft or more: maneuvers on rugs and over door sills with supervision, cueing or coaxing FIM - Locomotion: Ambulation Locomotion: Ambulation Assistive Devices: Other (comment);Walker - Hemi (3 musketeers then hemi walker, L DF wrap assist) Ambulation/Gait Assistance: 1: +2 Total assist (+3 for w/c follow with 3 musketeers technique and +2 for w/c follow with use of hemiwalker) Locomotion: Ambulation: 1: Two helpers FIM - Locomotion: Stairs Locomotion: Scientist, physiological: Insurance account manager -  1 Locomotion: Stairs: 2: Up and Down 4 - 11 stairs with maximal assistance (Pt: 25 - 49%)   Refer to Care Plan for Long Term Goals  Recommendations for other services: Neuropsych  Discharge Criteria: Patient will be discharged from PT if patient refuses treatment 3 consecutive times without medical reason, if treatment goals not met, if there is a change in medical status, if patient makes no progress towards goals or if patient is discharged from hospital.  The above assessment, treatment plan, treatment alternatives and  goals were discussed and mutually agreed upon: by patient and by family  Varner,  A 07/09/2014, 10:53 AM  

## 2014-07-09 NOTE — Care Management Note (Signed)
[facility] Individual Statement of Services  Patient Name:  Samuel Shaw  Date:  07/09/2014  Welcome to the [facility].  Our goal is to provide you with an individualized program based on your diagnosis and situation, designed to meet your specific needs.  With this comprehensive rehabilitation program, you will be expected to participate in at least 3 hours of rehabilitation therapies Monday-Friday, with modified therapy programming on the weekends.  Your rehabilitation program will include the following services:  Physical Therapy (PT), Occupational Therapy (OT), Speech Therapy (ST), 24 hour per day rehabilitation nursing, Therapeutic Recreaction (TR), Neuropsychology, Case Management (Social Worker), Rehabilitation Medicine, Nutrition Services and Pharmacy Services  Weekly team conferences will be held on Wednesday to discuss your progress.  Your Social Worker will talk with you frequently to get your input and to update you on team discussions.  Team conferences with you and your family in attendance may also be held.  Expected length of stay: 18-20 days  Overall anticipated outcome: supervision set-up and cueing  Depending on your progress and recovery, your program may change. Your Social Worker will coordinate services and will keep you informed of any changes. Your Social Worker's name and contact numbers are listed  below.  The following services may also be recommended but are not provided by the [facility] will be made to provide these services after discharge if needed.  Arrangements include referral to agencies that provide these services.  Your insurance has been verified to be:  [employer] Your primary doctor is:  Tula Nakayama  Pertinent information will be shared  with your doctor and your insurance company.  Social Worker:  Ovidio Kin, [place] or ([phone]  Information discussed with and copy given to patient by: Elease Hashimoto, 07/09/2014, 11:16 AM

## 2014-07-09 NOTE — Progress Notes (Signed)
Social Work Assessment and Plan Social Work Assessment and Plan  Patient Details  Name: Samuel Shaw MRN: [id number] Date of Birth: October 01, 1960  Today's Date: 07/09/2014  Problem List:  Patient Active Problem List   Diagnosis Date Noted  . 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 CVA (cerebral vascular accident)   . Tobacco abuse   . HLD (hyperlipidemia)   . CVA (cerebral infarction) 07/06/2014   Past Medical History:  Past Medical History  Diagnosis Date  . Stroke    Past Surgical History: History reviewed. No pertinent past surgical history. Social History:  reports that he has been smoking.  He does not have any smokeless tobacco history on file. He reports that he does not drink alcohol or use illicit drugs.  Family / Support Systems Marital Status: Married Patient Roles: Spouse, Parent, Other (Comment) (Employee) Spouse/Significant Other: [name] [phone]  [phone] Children: Gonzella Lex Artisit-daughter  [phone] Other Supports: other family members and friends Anticipated Caregiver: Wife and daughter's Ability/Limitations of Caregiver: Wife works nights-12-8 am at [employer] but daughter home and can assist Caregiver Availability: 24/7 Family Dynamics: Close knit family who are very supportive of one another and there.  Wife states: " All of the kids are always around sometimes too much so."  Both feel very grateful for their family and are confident he will do well here.  Social History Preferred language: English Religion:  Cultural Background: No issues Education: High School Read: Yes Write: Yes Employment Status: Employed Name of Employer: [employer] Return to Work Plans: Wants to return to work Freight forwarder Issues: No issues Guardian/Conservator: None-according to MD pt is capable of making his own decisions while here, but will make sure wife is present also.   Abuse/Neglect Physical  Abuse: Denies Verbal Abuse: Denies Sexual Abuse: Denies Exploitation of patient/patient's resources: Denies Self-Neglect: Denies  Emotional Status Pt's affect, behavior adn adjustment status: Pt is exhausted from therapies this am and resting.  He is motivated to do well and wants to regain his independence before he leaves here.  He reports: " This is gotten worse over time."  Wife reports he has always been independent and taken care of himself. Recent Psychosocial Issues: Other health issues-TIA two years ago and CVA in Dec 2015. Pyschiatric History: No history deferred depression screen due to pt feeling doing ok and not wanting to talk at this time.  Would benefit from Neuro-psych eval due to young age and wanting to return to work. Substance Abuse History: tobacco-plans to quit but has smoked since he was a young boy. Aware of the resources available to him  Patient / Family Perceptions, Expectations & Goals Pt/Family understanding of illness & functional limitations: Pt and wife can explain his stroke and how it progressively got worse with his function.  Both have spoken with MD and feel they have a good understanding of his deficits and treatment plan while here. Premorbid pt/family roles/activities: Husband, father, grandfather, Film/video editor, etc Anticipated changes in roles/activities/participation: resume Pt/family expectations/goals: Pt states: " I want to move myself."  Wife states: " I hope he can get as independent as possible while here.:  [employer]: None Premorbid Home Care/DME Agencies: None Transportation available at discharge: Family Resource referrals recommended: Support group (specify), Neuropsychology  Discharge Planning Living Arrangements: Spouse/significant other, Children Support Systems: Spouse/significant other, Children, Other relatives, Water engineer, Social worker community Type of Residence: Private  residence Insurance Resources: Multimedia programmer (specify) ([place]) Museum/gallery curator Resources:  Employment, Secondary school teacher Screen Referred: No Living Expenses: Higher education careers adviser Management: Patient, Spouse Does the patient have any problems obtaining your medications?: No Home Management: Wife Patient/Family Preliminary Plans: Return home with wife and daughter who can provide assist between them and other daughter's involved also.  Wife works nights and daughter is home so someone will be there 24 hr wwith pt at discharge. Social Work Anticipated Follow Up Needs: HH/OP, Support Group  Clinical Impression Pleasant exhausted gentleman who is resting in bed after therapies, wife very supportive and ready for husband to be here on rehab.  Supportive family who plan to provide 24 hr care at discharge. Will await team's evaluations and come up with a discharge plan. Will refer pt to Neuro-psych when appropriate.  Elease Hashimoto 07/09/2014, 1:01 PM

## 2014-07-09 NOTE — Progress Notes (Signed)
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 male with history of tobacco abuse, TIA 2 years ago, CVA late December with recent discharge from hospital in [place] where he was visiting and maintained on aspirin 325 mg daily. Patient lives with his wife in [place]. Presented 07/06/2014 with progressive left-sided weakness since recent discharge from the hospital. CT scan reviewed from outside hospital in December showed acute right basal ganglia infarct. There was possible right ICA stenosis identified on carotid ultrasound. MRI of the brain 07/06/2014 showed acute multifocal infarcts within right middle cerebral artery territory including right basal ganglia, right anterior occipital and right middle cerebral artery territory/ watershed. 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 CTA and GO of the neck showed right ICA occlusion 1 cm above its origin. Echocardiogram pending. Patient did not receive TPA. Neurology follow-up currently maintained on aspirin therapy. Subcutaneous heparin added for DVT prophylaxis. Mechanical soft diet. Occupational therapy evaluation completed 07/07/2013 with recommendations of physical medicine rehabilitation consult.   Review of Systems  Neurological: Positive for dizziness and weakness.  All other systems reviewed and are negative.  Past Medical History  Diagnosis Date  . Stroke    History reviewed. No pertinent past surgical history. History reviewed. No  pertinent family history. Social History:  reports that he has been smoking. He does not have any smokeless tobacco history on file. He reports that he does not drink alcohol or use illicit drugs. Allergies: No Known Allergies Medications Prior to Admission  Medication Sig Dispense Refill  . aspirin 325 MG tablet Take 325 mg by mouth daily.    . folic acid (FOLVITE) 474 MCG tablet Take 400 mcg by mouth daily.    . nicotine (NICODERM CQ - DOSED IN MG/24 HOURS) 21 mg/24hr patch Place 21 mg onto the skin daily.    . Thiamine HCl (VITAMIN B-1) 250 MG tablet Take 250 mg by mouth daily.    . permethrin (ELIMITE) 5 % cream Apply from neck down at night after shower. Wash off in the morning. May repeat in 1 week if still symptomatic. (Patient not taking: Reported on 07/06/2014) 60 g 0    Home: Home Living Family/patient expects to be discharged to:: Inpatient rehab Living Arrangements: Spouse/significant other  Functional History: Prior Function Level of Independence: Independent Comments: prior to dec pt was independent and worked in [employer]. Functional Status:  Mobility: Bed Mobility Overal bed mobility: Needs Assistance Bed Mobility: Supine to Sit Supine to sit: Mod assist General bed mobility comments: Bed was flat. Pt gets started but needed tactile cues to continue coming to side of bed.  Transfers Overall transfer level: Needs assistance Equipment used: 1 person hand held assist Transfers: Stand Pivot Transfers, Sit to/from Stand Sit to Stand: Mod assist Stand pivot transfers: Max assist General transfer comment: Pt was max assist to transfer to weak side and mod to transfer to strong side.      ADL: ADL Overall ADL's : Needs assistance/impaired Eating/Feeding: NPO Grooming: Wash/dry hands, Wash/dry face, Oral care, Minimal assistance, Sitting Grooming  Details (indicate cue type and reason): Pt requires cues to attempt to use L hand and  VCs to problem solve how to do things diffferently with decreased use of L side.  Upper Body Bathing: Moderate assistance, Sitting Upper Body Bathing Details (indicate cue type and reason): assist with hand over hand using L side to wash R. Lower Body Bathing: Maximal assistance, Sit to/from stand Lower Body Bathing Details (indicate cue type and reason): assist in standing. Pt with very poor balance. Cues to cross L leg over R to attempt to reach foot. Upper Body Dressing : Maximal assistance, Sitting Upper Body Dressing Details (indicate cue type and reason): educated on hemi dressing technique. Lower Body Dressing: Maximal assistance, Sit to/from stand Lower Body Dressing Details (indicate cue type and reason): poor standing balance and safety. Toilet Transfer: Moderate assistance, Stand-pivot, BSC Toileting- Clothing Manipulation and Hygiene: Maximal assistance, Sit to/from stand Toileting - Clothing Manipulation Details (indicate cue type and reason): Pt requires max assist +1 for toileting and cleaning self. Functional mobility during ADLs: Maximal assistance General ADL Comments: Pt requires great assist for adls due to L side weakness and decreased safety awareness.  Cognition: Cognition Overall Cognitive Status: Impaired/Different from baseline Orientation Level: Oriented X4 Cognition Arousal/Alertness: Awake/alert Behavior During Therapy: Flat affect Overall Cognitive Status: Impaired/Different from baseline Area of Impairment: Safety/judgement, Problem solving Safety/Judgement: Decreased awareness of safety, Decreased awareness of deficits Problem Solving: Slow processing, Difficulty sequencing, Requires verbal cues General Comments: Pt answered all questions correctly. Very flat affect; question depression.  Blood pressure 136/75, pulse 45, temperature 98 F (36.7 C), temperature source Oral, resp. rate 20, height 6\' 1"  (1.854 m), weight 81 kg (178 lb 9.2 oz), SpO2 100  %. Physical Exam  Vitals reviewed. Constitutional: He appears well-developed.  HENT:  Head: Normocephalic and atraumatic.  Left facial weakness  Eyes: EOM are normal. Scleral icterus is present.  Neck: Normal range of motion. Neck supple. No tracheal deviation present. No thyromegaly present.  Cardiovascular: Normal rate and regular rhythm.  Respiratory: Effort normal and breath sounds normal. No respiratory distress.  GI: Soft. Bowel sounds are normal. He exhibits no distension.  Musculoskeletal: Normal range of motion.  Neurological: He is alert.  Left central 7 and tongue deviation. Speech slightly slurred,. Right gaze preference. MMT-- Right: 4+/5 deltoid, 4+/5 bicep, 4+/5 tricep, 4+/5 wrist extension, 4+/5 hand intrinsics. 4+/5 hip flexor, 4+/5 knee extension, 4+/5 ankle dorsiflexion, 4+/5 ankle plantarflexion. Left: 3/5 deltoid, 3+/5 bicep, 3+/5 tricep, 3+/5 wrist extension, 3+/5 hand intrinsics. 3/5 hip flexor, 3/5 knee extension, 3+/5 ankle dorsiflexion, 3+/5 ankle plantarflexion. Mild sensory changes on the left?  Patient is able to state his name and age. Follows simple commands  Skin: Skin is warm and dry.  Psychiatric:  Extremely flat. Slow to engage     Lab Results Last 24 Hours    Results for orders placed or performed during the hospital encounter of 07/06/14 (from the past 24 hour(s))  CBC Status: None   Collection Time: 07/06/14 12:58 PM  Result Value Ref Range   WBC 5.7 4.0 - 10.5 K/uL   RBC 5.07 4.22 - 5.81 MIL/uL   Hemoglobin 16.2 13.0 - 17.0 g/dL   HCT 45.7 39.0 - 52.0 %   MCV 90.1 78.0 - 100.0 fL   MCH 32.0 26.0 - 34.0 pg   MCHC 35.4 30.0 - 36.0 g/dL   RDW 13.1 11.5 - 15.5 %   Platelets 191 150 - 400 K/uL  Differential Status: None  Collection Time: 07/06/14 12:58 PM  Result Value Ref Range   Neutrophils Relative % 45 43 - 77 %   Neutro Abs 2.5 1.7 - 7.7 K/uL   Lymphocytes  Relative 44 12 - 46 %   Lymphs Abs 2.6 0.7 - 4.0 K/uL   Monocytes Relative 9 3 - 12 %   Monocytes Absolute 0.5 0.1 - 1.0 K/uL   Eosinophils Relative 1 0 - 5 %   Eosinophils Absolute 0.1 0.0 - 0.7 K/uL   Basophils Relative 1 0 - 1 %   Basophils Absolute 0.0 0.0 - 0.1 K/uL  Comprehensive metabolic panel Status: Abnormal   Collection Time: 07/06/14 12:58 PM  Result Value Ref Range   Sodium 136 135 - 145 mmol/L   Potassium 4.4 3.5 - 5.1 mmol/L   Chloride 104 96 - 112 mEq/L   CO2 24 19 - 32 mmol/L   Glucose, Bld 99 70 - 99 mg/dL   BUN 8 6 - 23 mg/dL   Creatinine, Ser 1.09 0.50 - 1.35 mg/dL   Calcium 9.6 8.4 - 10.5 mg/dL   Total Protein 7.0 6.0 - 8.3 g/dL   Albumin 4.2 3.5 - 5.2 g/dL   AST 21 0 - 37 U/L   ALT 18 0 - 53 U/L   Alkaline Phosphatase 53 39 - 117 U/L   Total Bilirubin 2.0 (H) 0.3 - 1.2 mg/dL   GFR calc non Af Amer 76 (L) >90 mL/min   GFR calc Af Amer 88 (L) >90 mL/min   Anion gap 8 5 - 15  I-stat troponin, ED (not at [facility]) Status: None   Collection Time: 07/06/14 1:16 PM  Result Value Ref Range   Troponin i, poc 0.00 0.00 - 0.08 ng/mL   Comment 3     APTT Status: None   Collection Time: 07/06/14 2:20 PM  Result Value Ref Range   aPTT 29 24 - 37 seconds  Protime-INR Status: None   Collection Time: 07/06/14 2:20 PM  Result Value Ref Range   Prothrombin Time 14.6 11.6 - 15.2 seconds   INR 1.13 0.00 - 1.49  Urinalysis, Routine w reflex microscopic Status: Abnormal   Collection Time: 07/07/14 12:07 AM  Result Value Ref Range   Color, Urine AMBER (A) YELLOW   APPearance HAZY (A) CLEAR   Specific Gravity, Urine 1.026 1.005 - 1.030   pH 5.5 5.0 - 8.0   Glucose, UA NEGATIVE NEGATIVE mg/dL   Hgb urine dipstick NEGATIVE NEGATIVE   Bilirubin Urine SMALL (A) NEGATIVE    Ketones, ur NEGATIVE NEGATIVE mg/dL   Protein, ur NEGATIVE NEGATIVE mg/dL   Urobilinogen, UA 1.0 0.0 - 1.0 mg/dL   Nitrite NEGATIVE NEGATIVE   Leukocytes, UA NEGATIVE NEGATIVE  Lipid panel Status: Abnormal   Collection Time: 07/07/14 4:02 AM  Result Value Ref Range   Cholesterol 182 0 - 200 mg/dL   Triglycerides 116 <150 mg/dL   HDL 24 (L) >39 mg/dL   Total CHOL/HDL Ratio 7.6 RATIO   VLDL 23 0 - 40 mg/dL   LDL Cholesterol 135 (H) 0 - 99 mg/dL      Imaging Results (Last 48 hours)    Dg Chest 2 View  07/06/2014 CLINICAL DATA: Increasing left-sided weakness. Previous stroke. Smoker. EXAM: CHEST 2 VIEW COMPARISON: 10/31/2008. FINDINGS: Normal sized heart. Clear lungs. Increased density overlying the posterior aspect of the lower thoracic spine. IMPRESSION: Possible mass overlying the posterior aspect of the lower thoracic spine. A chest CT with contrast is recommended. Electronically Signed  By: Enrique Sack M.D. On: 07/06/2014 15:54   Ct Head Wo Contrast  07/06/2014 CLINICAL DATA: Worsened left side weakness. The patient was recently diagnosed with stroke at an outside facility. EXAM: CT HEAD WITHOUT CONTRAST TECHNIQUE: Contiguous axial images were obtained from the base of the skull through the vertex without intravenous contrast. COMPARISON: Head CT scan and brain MRI 10/31/2008. FINDINGS: Hypoattenuation in the anterior right MCA territory correlates with recent diagnosis of infarct. Scattered areas of hypoattenuation are also seen in the deep white matter with lacunar infarction which appears remote in the right internal capsule identified. No evidence of acute infarction, hemorrhage, mass lesion, mass effect, midline shift or abnormal extra-axial fluid collection is identified. There is no hydrocephalus or pneumocephalus. The calvarium is intact. IMPRESSION: No acute finding. Findings consistent with subacute to remote  right MCA territory infarct. Chronic microvascular ischemic change also noted. Electronically Signed By: Inge Rise M.D. On: 07/06/2014 14:57   Ct Angio Neck W/cm &/or Wo/cm  07/07/2014 CLINICAL DATA: 54 year old male with right hemispheric infarcts. Subsequent encounter. EXAM: CT ANGIOGRAPHY NECK TECHNIQUE: Multidetector CT imaging of the neck was performed using the standard protocol during bolus administration of intravenous contrast. Multiplanar CT image reconstructions and MIPs were obtained to evaluate the vascular anatomy. Carotid stenosis measurements (when applicable) are obtained utilizing NASCET criteria, using the distal internal carotid diameter as the denominator. CONTRAST: 50 cc Omnipaque 350. COMPARISON: 07/06/2014 brain MR. 11/03/2008 catheter angiogram. 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 Recommend annual imaging followup by CTA or MRA. This recommendation follows 2010 ACCF/AHA/AATS/ACR/ASA/SCA/SCAI/SIR/STS/SVM Guidelines for the Diagnosis and Management of Patients With Thoracic Aortic Disease. Circulation. 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 Etiology of occlusion is indeterminate. Left carotid system: Focal plaque proximal left internal carotid artery without measurable/ significant stenosis. Vertebral arteries:Mild narrowing proximal right vertebral artery. Other: Calcified mediastinal lymph nodes suggestive of prior granulomatous exposure. Small thyroid lesions more notable on the left measuring up to 1.2 cm. Dental disease. Subtle low-density structure within the left palatine tonsil may represent result of prior inflammation without discrete mass identified. Cervical spondylotic changes with spinal stenosis most notable C3-4 thru C5-6 level. 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 Etiology indeterminate. Focal plaque  proximal left internal carotid artery without measurable/significant stenosis. Mild narrowing proximal right vertebral artery. Slightly ectatic ascending thoracic aorta (3.4 cm) with follow-up as noted above. Surrounding incidental findings as detailed above. These results will be called to the ordering clinician or representative by the Radiologist Assistant, and communication documented in the PACS or zVision Dashboard. Electronically Signed By: Chauncey Cruel M.D. On: 07/07/2014 09:00   Mr Brain Wo Contrast  07/06/2014 CLINICAL DATA: Progressive LEFT-sided weakness after stroke in late December (acute RIGHT basal ganglia infarct). History of transient ischemic attacks, and possible RIGHT internal carotid artery stenosis. EXAM: MRI HEAD WITHOUT CONTRAST MRA HEAD WITHOUT CONTRAST TECHNIQUE: Multiplanar, multiecho pulse sequences of the brain and surrounding structures were obtained without intravenous contrast. Angiographic images of the head were obtained using MRA technique without contrast. COMPARISON: CT of the head July 06, 2014 and MRI of the head Oct 31, 2008 FINDINGS: MRI HEAD FINDINGS Patchy areas of reduced diffusion within the RIGHT frontal, anterior occipital lobes, as well as the subcentimeter foci of reduced diffusion within the RIGHT basal ganglia, 14 mm focus of reduced diffusion within the RIGHT globus pallidus. Reduced diffusion within all lesions consistent with synchronous  ischemia. Blood sensitive sequences extremely motion degraded, limiting sensitivity for petechial hemorrhage, no lobar hematoma. Areas of acute ischemia demonstrate bright FLAIR signal, superimposed scattered subcentimeter FLAIR T2 hyperintensities within the supratentorial brain. No midline shift. The ventricles and sulci are overall normal for patient's age. No abnormal extra-axial fluid collections. Complete loss of the RIGHT internal carotid artery flow void, see below. Mild paranasal sinus mucosal  thickening without air-fluid levels. Ocular globes and orbital contents are unremarkable. Mastoid air cells are well aerated. No suspicious calvarial bone marrow signal. No abnormal sellar expansion. No cerebellar tonsillar ectopia. MRA HEAD FINDINGS Anterior circulation: Thready flow related enhancement within RIGHT cervical internal carotid artery, minimal flow related enhancement of RIGHT petrous, complete loss of the RIGHT cavernous and supra clinoid internal carotid artery flow related enhancement. LEFT cervical, petrous, cavernous and supra clinoid internal carotid artery demonstrates robust flow related enhancement. Apparent retrograde flow related enhancement of the RIGHT carotid terminus. Tiny anterior communicating artery is likely present. Generalized diminutive flow related enhancement RIGHT middle cerebral artery, normal flow related enhancement of the anterior cerebral arteries and LEFT middle cerebral artery. No aneurysm. No large vessel occlusion, hemodynamically significant stenosis, abnormal luminal irregularity, aneurysm. Posterior circulation: Codominant vertebral arteries. Basilar artery is patent, with normal flow related enhancement of the main branch vessels. Normal flow related enhancement of the posterior cerebral arteries. Tiny bilateral posterior communicating arteries are present. No large vessel occlusion, hemodynamically significant stenosis, abnormal luminal irregularity, aneurysm. IMPRESSION: MRI head: Acute multifocal infarcts within RIGHT middle cerebral artery territory (including RIGHT basal ganglia). RIGHT anterior occipital likely within RIGHT middle cerebral artery territory/watershed. Limited blood sensitive sequence without lobar hematoma. Mild white matter changes, independent of acute areas of ischemia suggest chronic small vessel ischemic disease. MRA head: RIGHT internal carotid artery occlusion. Bilateral posterior communicating arteries and suspected tiny  anterior communicating artery present. Diminutive RIGHT middle cerebral artery flow related enhancement. Recommend CT angiogram of the neck for further evaluation. These results will be called to the ordering clinician or representative by the Radiologist Assistant, and communication documented in the PACS or zVision Dashboard. Electronically Signed By: Elon Alas On: 07/06/2014 21:41   Mr Jodene Nam Head/brain Wo Cm  07/06/2014 CLINICAL DATA: Progressive LEFT-sided weakness after stroke in late December (acute RIGHT basal ganglia infarct). History of transient ischemic attacks, and possible RIGHT internal carotid artery stenosis. EXAM: MRI HEAD WITHOUT CONTRAST MRA HEAD WITHOUT CONTRAST TECHNIQUE: Multiplanar, multiecho pulse sequences of the brain and surrounding structures were obtained without intravenous contrast. Angiographic images of the head were obtained using MRA technique without contrast. COMPARISON: CT of the head July 06, 2014 and MRI of the head Oct 31, 2008 FINDINGS: MRI HEAD FINDINGS Patchy areas of reduced diffusion within the RIGHT frontal, anterior occipital lobes, as well as the subcentimeter foci of reduced diffusion within the RIGHT basal ganglia, 14 mm focus of reduced diffusion within the RIGHT globus pallidus. Reduced diffusion within all lesions consistent with synchronous ischemia. Blood sensitive sequences extremely motion degraded, limiting sensitivity for petechial hemorrhage, no lobar hematoma. Areas of acute ischemia demonstrate bright FLAIR signal, superimposed scattered subcentimeter FLAIR T2 hyperintensities within the supratentorial brain. No midline shift. The ventricles and sulci are overall normal for patient's age. No abnormal extra-axial fluid collections. Complete loss of the RIGHT internal carotid artery flow void, see below. Mild paranasal sinus mucosal thickening without air-fluid levels. Ocular globes and orbital contents are unremarkable.  Mastoid air cells are well aerated. No suspicious calvarial bone marrow signal. No abnormal sellar expansion.  No cerebellar tonsillar ectopia. MRA HEAD FINDINGS Anterior circulation: Thready flow related enhancement within RIGHT cervical internal carotid artery, minimal flow related enhancement of RIGHT petrous, complete loss of the RIGHT cavernous and supra clinoid internal carotid artery flow related enhancement. LEFT cervical, petrous, cavernous and supra clinoid internal carotid artery demonstrates robust flow related enhancement. Apparent retrograde flow related enhancement of the RIGHT carotid terminus. Tiny anterior communicating artery is likely present. Generalized diminutive flow related enhancement RIGHT middle cerebral artery, normal flow related enhancement of the anterior cerebral arteries and LEFT middle cerebral artery. No aneurysm. No large vessel occlusion, hemodynamically significant stenosis, abnormal luminal irregularity, aneurysm. Posterior circulation: Codominant vertebral arteries. Basilar artery is patent, with normal flow related enhancement of the main branch vessels. Normal flow related enhancement of the posterior cerebral arteries. Tiny bilateral posterior communicating arteries are present. No large vessel occlusion, hemodynamically significant stenosis, abnormal luminal irregularity, aneurysm. IMPRESSION: MRI head: Acute multifocal infarcts within RIGHT middle cerebral artery territory (including RIGHT basal ganglia). RIGHT anterior occipital likely within RIGHT middle cerebral artery territory/watershed. Limited blood sensitive sequence without lobar hematoma. Mild white matter changes, independent of acute areas of ischemia suggest chronic small vessel ischemic disease. MRA head: RIGHT internal carotid artery occlusion. Bilateral posterior communicating arteries and suspected tiny anterior communicating artery present. Diminutive RIGHT middle cerebral artery flow related  enhancement. Recommend CT angiogram of the neck for further evaluation. These results will be called to the ordering clinician or representative by the Radiologist Assistant, and communication documented in the PACS or zVision Dashboard. 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 Does the need for close, 24 hr/day medical supervision in concert with the patient's rehab needs make it unreasonable for this patient to 2. be served in a less intensive setting? Yes 3. Co-Morbidities requiring supervision/potential complications: post-stroke sequelae 4. Due to bladder management, bowel management, safety, skin/wound care, disease management, medication administration, pain management and patient education, does the patient require 24 hr/day rehab nursing? Yes 5. Does the patient require coordinated care of a physician, rehab nurse, PT (1-2 hrs/day, 5 days/week), OT (1-2 hrs/day, 5 days/week) and SLP (1-2 hrs/day, 5 days/week) to address physical and functional deficits in the context of the above medical diagnosis(es)? Yes Addressing deficits in the following areas: balance, endurance, locomotion, strength, transferring, bowel/bladder control, bathing, dressing, feeding, grooming, toileting, cognition, speech, language, swallowing and psychosocial support 6. Can the patient actively participate in an intensive therapy program of at least 3 hrs of therapy per day at least 5 days per week? Yes 7. The potential for patient to make measurable gains while on inpatient rehab is excellent 8. Anticipated functional outcomes upon discharge from inpatient rehab are supervision with PT, supervision with OT, supervision with SLP. 9. Estimated rehab length of stay to reach the above functional goals is: 16-23 days 10. Does the patient have adequate social  supports and living environment to accommodate these discharge functional goals? Yes 11. Anticipated D/C setting: Home 12. Anticipated post D/C treatments: [place] therapy 13. Overall Rehab/Functional Prognosis: excellent  RECOMMENDATIONS: This patient's condition is appropriate for continued rehabilitative care in the following setting: CIR Patient has agreed to participate in recommended program. Yes Note that insurance prior authorization may be required for reimbursement for recommended care.  Comment: Rehab Admissions Coordinator to follow up.  Thanks,  Meredith Staggers, MD, Mellody Drown     07/07/2014  Revision History     Date/Time User Provider Type Action   07/07/2014 1:41 PM Meredith Staggers, MD Physician Sign   07/07/2014 10:42 AM Cathlyn Parsons, PA-C Physician Assistant [place]   View Details Report       Routing History     Date/Time From To Method   07/07/2014 1:41 PM Meredith Staggers, MD Meredith Staggers, MD In Basket   07/07/2014 1:41 PM Meredith Staggers, MD Fayrene Helper, MD In [place]

## 2014-07-10 ENCOUNTER — Inpatient Hospital Stay (HOSPITAL_COMMUNITY): Payer: BLUE CROSS/BLUE SHIELD | Admitting: Occupational Therapy

## 2014-07-10 ENCOUNTER — Encounter (HOSPITAL_COMMUNITY): Payer: BLUE CROSS/BLUE SHIELD | Admitting: Occupational Therapy

## 2014-07-10 ENCOUNTER — Inpatient Hospital Stay (HOSPITAL_COMMUNITY): Payer: BLUE CROSS/BLUE SHIELD | Admitting: Speech Pathology

## 2014-07-10 DIAGNOSIS — I63511 Cerebral infarction due to unspecified occlusion or stenosis of right middle cerebral artery: Secondary | ICD-10-CM

## 2014-07-10 DIAGNOSIS — R001 Bradycardia, unspecified: Secondary | ICD-10-CM | POA: Diagnosis present

## 2014-07-10 DIAGNOSIS — I69319 Unspecified symptoms and signs involving cognitive functions following cerebral infarction: Secondary | ICD-10-CM

## 2014-07-10 DIAGNOSIS — G8110 Spastic hemiplegia affecting unspecified side: Secondary | ICD-10-CM

## 2014-07-10 DIAGNOSIS — I6931 Cognitive deficits following cerebral infarction: Secondary | ICD-10-CM

## 2014-07-10 MED ORDER — SENNOSIDES-DOCUSATE SODIUM 8.6-50 MG PO TABS
2.0000 | ORAL_TABLET | Freq: Two times a day (BID) | ORAL | Status: DC
  Administered 2014-07-10 – 2014-07-29 (×31): 2 via ORAL
  Filled 2014-07-10 (×32): qty 2
  Filled 2014-07-10: qty 1
  Filled 2014-07-10: qty 2

## 2014-07-10 NOTE — Progress Notes (Signed)
Occupational Therapy Session Note  Patient Details  Name: Samuel Shaw MRN: [id number] Date of Birth: March 31, 1961  Today's Date: 07/10/2014 OT Individual Time: 0901-1002 OT Individual Time Calculation (min): 61 min    Short Term Goals: Week 1:  OT Short Term Goal 1 (Week 1): Pt will complete all bathing with min assist sit to stand. OT Short Term Goal 2 (Week 1): Pt will maintain sustained attention and complete bathing with no more than mod questioning cues. OT Short Term Goal 3 (Week 1): Pt will perform walk-in shower transfers with min assist stand pivot. OT Short Term Goal 4 (Week 1): Pt will use the LUE as an acitve assist for bathing with min assist. OT Short Term Goal 5 (Week 1): Pt will perform toilet transfers to elevated toilet or 3:1 with min assist.  Week 2:     Skilled Therapeutic Interventions/Progress Updates:    Pt ambulated the bathroom with total assist to begin session.  Noted pt with bladder incontinence in the bed once we transferred.  He performed transfer from the shower seat to the 3:1 over the toilet with mod assist.  Mod facilitation to advance the LLE during transfer and ambulation.  He was able to perform bathing with mod instructional cueing to begin and sequence through the task.  He was able to integrate use of the LUE for bathing with min assist.  Mod assist for sit to stand from the chair to wash peri area.  Pt completed bathing and transferred back out to the wheelchair for dressing at the sink. Mod assist with mod demonstrational cueing for sequencing hemi-dressing techniques to donn pullover shirt.  Pt with decreased efficiency for lifting the LLE to donn pants so instructed him to cross them over.  He was able to perform but needs mod assist to keep the left leg from staying crossed over the right knee.   Therapy Documentation Precautions:  Precautions Precautions: Fall Restrictions Weight Bearing Restrictions: No  Pain: Pain Assessment Pain  Assessment: No/denies pain Pain Score: 3  ADL: See FIM for current functional status  Therapy/Group: Individual Therapy  Audrionna Lampton OTR/L 07/10/2014, 11:31 AM

## 2014-07-10 NOTE — Progress Notes (Deleted)
Patient information reviewed and entered into [facility] system by Loys Hoselton, RN, CRRN, PPS Coordinator.  Information including medical coding and functional independence measure will be reviewed and updated through discharge.    

## 2014-07-10 NOTE — Progress Notes (Signed)
Orthopedic Tech Progress Note Patient Details:  Samuel Shaw [birth year] [id number] [facility] called for brace order. Order taken by [place]. Patient ID: [name], male   DOB: Dec 17, 1960, 54 y.o.   MRN: [id number]   Fenton Foy 07/10/2014, 9:23 AM

## 2014-07-10 NOTE — Progress Notes (Signed)
Physical Therapy Session Note  Patient Details  Name: Samuel Shaw MRN: [id number] Date of Birth: 03/23/1961  Today's Date: 07/10/2014 PT Individual Time: 1400-1500 PT Individual Time Calculation (min): 60 min   Short Term Goals: Week 1:  PT Short Term Goal 1 (Week 1): Pt will perform bed <> w/c transfers with consistent min A. PT Short Term Goal 2 (Week 1): Pt will propel w/c 150 ft with mod I.  PT Short Term Goal 3 (Week 1): Pt will perform dynamic standing x 5 min with min A.  PT Short Term Goal 4 (Week 1): Pt will ambulate 150 ft using LRAD with mod A.  PT Short Term Goal 5 (Week 1): Pt will negotiate up/down 6 stairs using L rail ascending with mod A.   Skilled Therapeutic Interventions/Progress Updates:   Pt received supine in bed, agreeable to therapy with coaxing. Noted lunch meal untouched and pt's wife reporting that pt does not like what they have been bringing him. At end of session, ordered dinner meal with nutrition services for improved intake and pt educated that he can call to order in room as well. Pt transferred supine > sit with min A and verbal/tactile cues for sequencing rolling sidelying > sit. Pt performed squat pivot transfers throughout session with initial mod A progressed to min A with cues for technique and hand/foot placement. Pt noted to be wet with urine, therefore doffed soiled shirt and donned clean shirt with assist due to time constraints, pt required cues for hemi dressing technique. Pt propelled w/c using R hemi technique x 150 ft with supervision and cues for sequencing. Gait training using hemiwalker with L WalkOn AFO donned x 50 ft with max A, total cues for sequencing, upright posture, and advancing hemiwalker and mod assist to facilitate L foot placement and knee ext during weightbearing. Pt performed NMR in tall kneeling and static standing with +2 assist, see details below. Pt returned to room and left sitting in wheelchair with all needs within reach.    Therapy Documentation Precautions:  Precautions Precautions: Fall Restrictions Weight Bearing Restrictions: No Pain: Pain Assessment Pain Assessment: No/denies pain Other Treatments: Treatments Therapeutic Activity: In tall kneeling with small bench in front, initially with BUE supported progressed to removing RUE support and maintaining tall kneeling with trunk/hip extensors activated. Sit <> stand from edge of mat with mirror anterior for visual feedback, worked on weight shifting to R, LLE knee extensor activation, decreasing trunk rotation for improved upright posture and static standing. Neuromuscular Facilitation: Left;Lower Extremity;Upper Extremity;Forced use;Activity to increase motor control;Activity to increase timing and sequencing;Activity to increase grading;Activity to increase sustained activation;Activity to increase lateral weight shifting Weight Bearing Technique Weight Bearing Technique: Yes LUE Weight Bearing Technique: High kneeling Response to Weight Bearing Technique: with focus on supported activation of elbow extensors by "pushing" therapist away with LUE, good tolerance to activity.  See FIM for current functional status  Therapy/Group: Individual Therapy  Laretta Alstrom 07/10/2014, 8:28 AM

## 2014-07-10 NOTE — Progress Notes (Signed)
Discussed with patient the importance of emptying bladder at least every 8 hours. Patient refused to try and void and refused bladder scan or I&O cathing. Will continue to educate and monitor. Jimmie Molly, RN

## 2014-07-10 NOTE — Progress Notes (Signed)
54 y.o. male with history of tobacco abuse, TIA 2 years ago, CVA late December with recent discharge from hospital in [place] where he was visiting and maintained on aspirin 325 mg daily. Patient lives with his wife in [place]. Presented 07/06/2014 with progressive left-sided weakness and slurred speech since recent discharge from the hospital. CT scan reviewed from outside hospital in December showed acute right basal ganglia infarct. There was possible right ICA stenosis identified on carotid ultrasound. MRI of the brain 07/06/2014 showed acute multifocal infarcts within right middle cerebral artery territory including right basal ganglia, right anterior occipital and right middle cerebral artery territory/ watershed. 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 CTA of the neck showed right ICA occlusion 1 cm above its origin. Echocardiogram showed ejection fraction 60% no wall motion abnormalities. Subjective/Complaints: No pain c/os. Wife concerned about a lump on left foot. Patient states this is not painful he cannot tell me how long he has had it. Review of Systems - Negative except tired Objective: Vital Signs: Blood pressure 124/74, pulse 52, temperature 98.4 F (36.9 C), temperature source Oral, resp. rate 18, height 6' 2" (1.88 m), SpO2 97 %. No results found. Results for orders placed or performed during the hospital encounter of 07/08/14 (from the past 72 hour(s))  CBC     Status: None   Collection Time: 07/08/14  5:25 PM  Result Value Ref Range   WBC 5.7 4.0 - 10.5 K/uL   RBC 4.94 4.22 - 5.81 MIL/uL   Hemoglobin 15.8 13.0 - 17.0 g/dL   HCT 44.4 39.0 - 52.0 %   MCV 89.9 78.0 - 100.0 fL   MCH 32.0 26.0 - 34.0 pg   MCHC 35.6 30.0 - 36.0 g/dL   RDW 12.9 11.5 - 15.5 %   Platelets 197 150 - 400 K/uL  Creatinine, serum     Status: Abnormal   Collection Time: 07/08/14  5:25 PM  Result Value Ref Range   Creatinine, Ser 1.11 0.50 - 1.35 mg/dL   GFR calc non Af Amer  74 (L) >90 mL/min   GFR calc Af Amer 86 (L) >90 mL/min    Comment: (NOTE) The eGFR has been calculated using the CKD EPI equation. This calculation has not been validated in all clinical situations. eGFR's persistently <90 mL/min signify possible Chronic Kidney Disease.   CBC WITH DIFFERENTIAL     Status: Abnormal   Collection Time: 07/09/14  5:50 AM  Result Value Ref Range   WBC 5.6 4.0 - 10.5 K/uL   RBC 5.04 4.22 - 5.81 MIL/uL   Hemoglobin 16.0 13.0 - 17.0 g/dL   HCT 45.1 39.0 - 52.0 %   MCV 89.5 78.0 - 100.0 fL   MCH 31.7 26.0 - 34.0 pg   MCHC 35.5 30.0 - 36.0 g/dL   RDW 13.0 11.5 - 15.5 %   Platelets 208 150 - 400 K/uL   Neutrophils Relative % 26 (L) 43 - 77 %   Neutro Abs 1.5 (L) 1.7 - 7.7 K/uL   Lymphocytes Relative 63 (H) 12 - 46 %   Lymphs Abs 3.5 0.7 - 4.0 K/uL   Monocytes Relative 8 3 - 12 %   Monocytes Absolute 0.5 0.1 - 1.0 K/uL   Eosinophils Relative 3 0 - 5 %   Eosinophils Absolute 0.1 0.0 - 0.7 K/uL   Basophils Relative 0 0 - 1 %   Basophils Absolute 0.0 0.0 - 0.1 K/uL  Comprehensive metabolic panel  Status: Abnormal   Collection Time: 07/09/14  5:50 AM  Result Value Ref Range   Sodium 137 135 - 145 mmol/L    Comment: Please note change in reference range.   Potassium 4.1 3.5 - 5.1 mmol/L    Comment: Please note change in reference range.   Chloride 106 96 - 112 mEq/L   CO2 22 19 - 32 mmol/L   Glucose, Bld 109 (H) 70 - 99 mg/dL   BUN 9 6 - 23 mg/dL   Creatinine, Ser 1.09 0.50 - 1.35 mg/dL   Calcium 9.3 8.4 - 10.5 mg/dL   Total Protein 6.6 6.0 - 8.3 g/dL   Albumin 3.9 3.5 - 5.2 g/dL   AST 23 0 - 37 U/L   ALT 20 0 - 53 U/L   Alkaline Phosphatase 48 39 - 117 U/L   Total Bilirubin 1.2 0.3 - 1.2 mg/dL   GFR calc non Af Amer 76 (L) >90 mL/min   GFR calc Af Amer 88 (L) >90 mL/min    Comment: (NOTE) The eGFR has been calculated using the CKD EPI equation. This calculation has not been validated in all clinical situations. eGFR's persistently <90 mL/min  signify possible Chronic Kidney Disease.    Anion gap 9 5 - 15     HEENT: normal Cardio: RRR and no murmur Resp: CTA B/L and unlabored GI: BS positive and NT,ND Extremity:  Pulses positive and No Edema Skin:   Other calloused blister plantar surface left foot prox to 3rd met head, no drainage , no erythema, no tenderness Neuro: Lethargic, Flat, Cranial Nerve II-XII normal, Abnormal Sensory reduced LT left foot, Abnormal Motor 2- LDelt, 3- Left bi, tri, grip, Abnormal FMC Ataxic/ dec FMC, Dysarthric and Inattention Musc/Skel:  Normal Gen NAD   Assessment/Plan: 1. Functional deficits secondary to Right MCA infarct with Left HP which require 3+ hours per day of interdisciplinary therapy in a comprehensive inpatient rehab setting. Physiatrist is providing close team supervision and 24 hour management of active medical problems listed below. Physiatrist and rehab team continue to assess barriers to discharge/monitor patient progress toward functional and medical goals. FIM: FIM - Bathing Bathing Steps Patient Completed: Chest, Left Arm, Abdomen, Right upper leg, Left upper leg Bathing: 3: Mod-Patient completes 5-7 21f10 parts or 50-74%  FIM - Upper Body Dressing/Undressing Upper body dressing/undressing: 4: Min-Patient completed 75 plus % of tasks FIM - Lower Body Dressing/Undressing Lower body dressing/undressing steps patient completed: Thread/unthread right underwear leg, Thread/unthread right pants leg Lower body dressing/undressing: 2: Max-Patient completed 25-49% of tasks  FIM - Toileting Toileting: 1: Total-Patient completed zero steps, helper did all 3  FIM - TRadio producerDevices: Bedside commode Toilet Transfers: 3-From toilet/BSC: Mod A (lift or lower assist), 3-To toilet/BSC: Mod A (lift or lower assist)  FIM - BControl and instrumentation engineerDevices: Arm rests Bed/Chair Transfer: 4: Supine > Sit: Min A (steadying Pt. >  75%/lift 1 leg), 3: Chair or W/C > Bed: Mod A (lift or lower assist)  FIM - Locomotion: Wheelchair Distance: 150 ft Locomotion: Wheelchair: 5: Travels 150 ft or more: maneuvers on rugs and over door sills with supervision, cueing or coaxing FIM - Locomotion: Ambulation Locomotion: Ambulation Assistive Devices: Other (comment), Walker - Hemi (3 musketeers then hemi walker, L DF wrap assist) Ambulation/Gait Assistance: 1: +2 Total assist (+3 for w/c follow with 3 musketeers technique and +2 for w/c follow with use of hemiwalker) Locomotion: Ambulation: 1: Two helpers  Comprehension Comprehension  Mode: Auditory Comprehension: 3-Understands basic 50 - 74% of the time/requires cueing 25 - 50%  of the time  Expression Expression Mode: Verbal Expression: 3-Expresses basic 50 - 74% of the time/requires cueing 25 - 50% of the time. Needs to repeat parts of sentences.  Social Interaction Social Interaction: 4-Interacts appropriately 75 - 89% of the time - Needs redirection for appropriate language or to initiate interaction.  Problem Solving Problem Solving: 2-Solves basic 25 - 49% of the time - needs direction more than half the time to initiate, plan or complete simple activities  Memory Memory: 3-Recognizes or recalls 50 - 74% of the time/requires cueing 25 - 49% of the time   Medical Problem List and Plan: 1. 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 DVT Prophylaxis/Anticoagulation: Subcutaneous heparin. Monitor platelet counts have any signs of bleeding 3. Pain Management: Tylenol as needed 4. Tobacco abuse. Nicoderm patch. Provide counseling 5. Neuropsych: This patient is capable of making decisions on his own behalf. 6. Skin/Wound Care: Routine skin checks 7. Fluids/Electrolytes/Nutrition: Strict I and O in follow of chemistries 8. Hyperlipidemia. Lipitor 9.  Sinus bradycardia  asymptomatic no meds which contribute on list LOS (Days) 2 A FACE TO FACE EVALUATION WAS PERFORMED  Katelyn Broadnax E 07/10/2014, 8:49 AM

## 2014-07-10 NOTE — IPOC Note (Signed)
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   Spastic hemiplegia affecting nondominant side   Cognitive deficit, post-stroke   Sinus bradycardia on ECG     Functional Problem List: Nursing Bladder, Bowel, Safety, Sensory, Edema, Endurance, Medication Management, Motor, Nutrition, Perception  PT Balance, Behavior, Endurance, Motor, Perception, Safety  OT Balance, Cognition, Endurance, Motor, Safety  SLP Cognition  TR         Basic ADL's: OT Eating, Grooming, Bathing, Dressing, Toileting     Advanced  ADL's: OT Simple Meal Preparation     Transfers: PT Bed Mobility, Bed to Chair, Car, Furniture, Floor  OT Toilet, Metallurgist: PT Ambulation, Emergency planning/management officer, Stairs     Additional Impairments: OT Fuctional Use of Upper Extremity  SLP Swallowing, Social Cognition   Social Interaction, Problem Solving, Memory, Attention, Awareness  TR      Anticipated Outcomes Item Anticipated Outcome  Self Feeding modified independent  Swallowing  Mod I with least restrictive diet    Basic self-care  supervision  Toileting  supervision   Bathroom Transfers supervision  Bowel/Bladder  manage bladder with level 5 assist of staff; manage bowel with level 5 assist of BSC and medication administration for bowel movement q 2 days with mod I assist at discharge  Transfers  supervision  Locomotion  supervision  Communication     Cognition  Supervision   Pain  n/a  Safety/Judgment  Maintain safety with level 5 assist/cues/reminders   Therapy Plan: PT Intensity: Minimum of 1-2 x/day ,45 to 90 minutes PT Frequency: 5 out of 7 days PT Duration Estimated Length of Stay: 18-20 days OT Intensity: Minimum of 1-2 x/day, 45 to 90 minutes OT Frequency: 5 out of 7 days OT  Duration/Estimated Length of Stay: 17-19 days SLP Intensity: Minumum of 1-2 x/day, 30 to 90 minutes SLP Frequency: 5 out of 7 days SLP Duration/Estimated Length of Stay: 18-20 days       Team Interventions: Nursing Interventions Patient/Family Education, Medication Management, Bladder Management, Bowel Management, Cognitive Remediation/Compensation, Dysphagia/Aspiration Precaution Training, Disease Management/Prevention, Discharge Planning, Psychosocial Support  PT interventions Ambulation/gait training, Balance/vestibular training, Discharge planning, Disease management/prevention, DME/adaptive equipment instruction, Functional mobility training, Neuromuscular re-education, Patient/family education, Stair training, Splinting/orthotics, Psychosocial support, Therapeutic Activities, Therapeutic Exercise, UE/LE Strength taining/ROM, UE/LE Coordination activities, Wheelchair propulsion/positioning  OT Interventions Training and development officer, DME/adaptive equipment instruction, Patient/family education, Therapeutic Activities, Discharge planning, Functional mobility training, Community reintegration, Cognitive remediation/compensation, Neuromuscular re-education, Disease mangement/prevention, Self Care/advanced ADL retraining, Therapeutic Exercise, UE/LE Strength taining/ROM, UE/LE Coordination activities  SLP Interventions Cognitive remediation/compensation, Cueing hierarchy, Dysphagia/aspiration precaution training, Environmental controls, Functional tasks, Internal/external aids, Patient/family education, Therapeutic Activities  TR Interventions    SW/CM Interventions Discharge Planning, Psychosocial Support, Patient/Family Education    Team Discharge Planning: Destination: PT-Home ,OT- Home , SLP-Home Projected Follow-up: PT-Outpatient PT, 24 hour supervision/assistance, OT-  Outpatient OT, SLP-Home Health SLP, Outpatient SLP, 24 hour supervision/assistance Projected Equipment Needs: PT-To be  determined, OT- 3 in 1 bedside comode, Tub/shower bench, SLP-None recommended by SLP Equipment Details: PT- , OT-  Patient/family involved in discharge planning: PT- Patient, Family member/caregiver,  OT-Patient, Family member/caregiver, SLP-Patient, Family member/caregiver  MD ELOS: 17-24d Medical Rehab Prognosis:  Good Assessment: 54 y.o. male with history of tobacco abuse, TIA 2 years ago, CVA late December with recent discharge from hospital in  [place] where he was visiting and maintained on aspirin 325 mg daily. Patient lives with his wife in [place]. Presented 07/06/2014 with progressive left-sided weakness and slurred speech since recent discharge from the hospital. CT scan reviewed from outside hospital in December showed acute right basal ganglia infarct. There was possible right ICA stenosis identified on carotid ultrasound. MRI of the brain 07/06/2014 showed acute multifocal infarcts within right middle cerebral artery territory including right basal ganglia, right anterior occipital and right middle cerebral artery territory/ watershed. 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 CTA  of the neck showed right ICA occlusion 1 cm above its origin. Echocardiogram showed ejection fraction 60% no wall motion abnormalities   Now requiring 24/7 Rehab RN,MD, as well as CIR level PT, OT and SLP.  Treatment team will focus on ADLs and mobility with goals set at Sup   See Team Conference Notes for weekly updates to the plan of care

## 2014-07-10 NOTE — Progress Notes (Signed)
Speech Language Pathology Daily Session Note  Patient Details  Name: Samuel Shaw MRN: [id number] Date of Birth: 03/03/1961  Today's Date: 07/10/2014 SLP Individual Time: 1000-1100 SLP Individual Time Calculation (min): 60 min  Short Term Goals: Week 1: SLP Short Term Goal 1 (Week 1): Patient will attend to left field of enviornment during functional tasks with Min A multimodal cues.  SLP Short Term Goal 2 (Week 1): Patient will demonstrate functional problem solving for basic and familiar tasks with supervision multimodal cues.  SLP Short Term Goal 3 (Week 1): Patient will sustain attention to a functional task for 15 minutes with Min A multimodal cues for redirection.  SLP Short Term Goal 4 (Week 1): Patient will identify 1 cognitive deficit with Min A multimodal cues.  SLP Short Term Goal 5 (Week 1): Patient will utilize call bell to express wants/needs with supervision multimodal cues.  SLP Short Term Goal 6 (Week 1): Patient will consume current diet with minimal overt s/s of aspiration with supervision multimodal cues for use of swallow strategies .  Skilled Therapeutic Interventions: Skilled treatment session focused on cognitive goals. SLP facilitated session by providing Min A multimodal cues with extra time for functional problem solving with a basic money management task. SLP also facilitated session by providing Min A multimodal cues for problem solving and organization with a mildly complex, new learning task.  Patient attended to left field of environment with Mod I throughout the task but required overall Min A for sustained attention to task for 2 minutes. RN reported that patient is refusing to take medications whole in puree, however, patient demonstrates overt s/s of aspiration when medications are taken with thin liquids.  After discussion with the patient and his wife, patient eventually agreed to take medications whole with magic cup. Both the patient and his wife were  educated in regards to his current swallowing and cognitive impairments and goals of skilled SLP intervention, both verbalized understanding and wife asked appropriate questions. Patient left in wheelchair with wife present.    FIM:  Comprehension Comprehension Mode: Auditory Comprehension: 4-Understands basic 75 - 89% of the time/requires cueing 10 - 24% of the time Expression Expression Mode: Verbal Expression: 5-Expresses basic 90% of the time/requires cueing < 10% of the time. Social Interaction Social Interaction: 3-Interacts appropriately 50 - 74% of the time - May be physically or verbally inappropriate. Problem Solving Problem Solving: 2-Solves basic 25 - 49% of the time - needs direction more than half the time to initiate, plan or complete simple activities Memory Memory: 3-Recognizes or recalls 50 - 74% of the time/requires cueing 25 - 49% of the time  Pain Pain Assessment Pain Assessment: No/denies pain  Therapy/Group: Individual Therapy  Jakaria Lavergne 07/10/2014, 4:22 PM

## 2014-07-11 ENCOUNTER — Inpatient Hospital Stay (HOSPITAL_COMMUNITY): Payer: BLUE CROSS/BLUE SHIELD | Admitting: Speech Pathology

## 2014-07-11 ENCOUNTER — Inpatient Hospital Stay (HOSPITAL_COMMUNITY): Payer: BLUE CROSS/BLUE SHIELD | Admitting: *Deleted

## 2014-07-11 ENCOUNTER — Inpatient Hospital Stay (HOSPITAL_COMMUNITY): Payer: BLUE CROSS/BLUE SHIELD | Admitting: Physical Therapy

## 2014-07-11 NOTE — Progress Notes (Signed)
Physical Therapy Session Note  Patient Details  Name: Samuel Shaw MRN: [id number] Date of Birth: Dec 26, 1960  Today's Date: 07/11/2014 PT Individual Time: 1100-1200 PT Individual Time Calculation (min): 60 min   Short Term Goals: Week 1:  PT Short Term Goal 1 (Week 1): Pt will perform bed <> w/c transfers with consistent min A. PT Short Term Goal 2 (Week 1): Pt will propel w/c 150 ft with mod I.  PT Short Term Goal 3 (Week 1): Pt will perform dynamic standing x 5 min with min A.  PT Short Term Goal 4 (Week 1): Pt will ambulate 150 ft using LRAD with mod A.  PT Short Term Goal 5 (Week 1): Pt will negotiate up/down 6 stairs using L rail ascending with mod A.   Skilled Therapeutic Interventions/Progress Updates:    Therapeutic Exercise: PT instructs pt in L LE ROM and strengthening exercises: ankle DF/toe taps x 10 reps (initial partial AROM noted, but with fatigue progressed to trace movement while PT palpates pt's tendons firing), LAQ (full knee extension) and seated knee flexion partial ROM x 10 reps, seated march (pt completes partial ROM then utilizes power to achieve further hip flexion) x 10 reps.   W/C Management: PT instructs pt in w/c propulsion with B UEs focusing on forced use of L UE x 75' req mod A for hand over hand to place hand far back on w/c and single step cues for sequencing to : "release, bring the arm back, squeeze, and push".   Gait Training: PT instructs pt in ambulation with RW req max A progressing to mod A x 1 and a 2nd assist for w/c follow for safety x 35' - pt req simple, one-step cues for gait sequencing: "lean R, move the walker, pick up your L foot [PT progresses it], [PT blocks L knee] big step with the R leg, move the walker, weight shift forward and R/stand on your R leg").   Therapeutic Activity: PT instructs pt in multiple sit to stands during PT session with RW, req simple one step cues for sequencing "put both hands on the armrest, scoot your bottom  to the edge, lean forward, stand up, lean R [pt leans strongly L upon initial stand]".   Pt demonstrates difficulty following multi-step commands and req specific, single step commands/cues for sequencing to complete a functional activity. Pt demonstrates muscle activation in all major muscle groups of the L UE and L LE, but is already demonstrating learned non-use. PT instructs pt in the "use it or lose it" phenomenon, but PT suspects pt's memory is poor. Continue in all aspects of L NMR and functional mobility training.   Therapy Documentation Precautions:  Precautions Precautions: Fall Restrictions Weight Bearing Restrictions: No  Pain: Pain Assessment Pain Assessment: No/denies pain  See FIM for current functional status  Therapy/Group: Individual Therapy  [name] M 07/11/2014, 11:07 AM

## 2014-07-11 NOTE — Progress Notes (Signed)
Occupational Therapy Session Note  Patient Details  Name: Samuel Shaw MRN: [id number] Date of Birth: [birth year]  Today's Date: 07/11/2014 OT Individual Time:  - [phone]  (60 min)      Short Term Goals: Week 1:  OT Short Term Goal 1 (Week 1): Pt will complete all bathing with min assist sit to stand. OT Short Term Goal 2 (Week 1): Pt will maintain sustained attention and complete bathing with no more than mod questioning cues. OT Short Term Goal 3 (Week 1): Pt will perform walk-in shower transfers with min assist stand pivot. OT Short Term Goal 4 (Week 1): Pt will use the LUE as an acitve assist for bathing with min assist. OT Short Term Goal 5 (Week 1): Pt will perform toilet transfers to elevated toilet or 3:1 with min assist.   Skilled Therapeutic Interventions/Progress Updates:    1st session:   Pt. Asleep in bed upon OT arrival. Required mod encouragement to initiate bathing and dressing.   Addressed bed mobility, transfers, standing balance, dynamic sitting balance, attention to left.  Pt went from supine to sit with supervision and bed rails. Transferred to wc with max  Assist (lifting and lowering).  Ppt transferred to shower bench with mod assist.  Stood to bathe peri with max assist.  Pt. Leaning to left during standing and was max assist for balance.  He was able to perform bathing with mod instructional cueing to begin and sequence through the task. He was able to integrate use of the LUE for bathing with min assist;   He was mod assist for sit to stand from the chair to wash peri area.  Pt completed bathing and transferred back out to the wheelchair for dressing at the sink. Mod assist with mod demonstrational cueing for sequencing hemi-dressing techniques to donn pullover shirt. Pt with decreased efficiency for lifting the LLE to donn pants>  Instructed him in cross over technique . He was able to perform but needs mod assist to keep the left leg from staying crossed over the  right knee.    Left pt in wc with safety belt on and call bell,phone within reach.     Therapy Documentation Precautions:  Precautions Precautions: Fall Restrictions Weight Bearing Restrictions: No    Vital Signs: Therapy Vitals Temp: 97.9 F (36.6 C) Temp Source: Oral Pulse Rate: (!) 51 Resp: 17 BP: 113/69 mmHg Patient Position (if appropriate): Lying Oxygen Therapy SpO2: 98 % O2 Device: Not Delivered Pain:  No pain 1 st session;  4/10  Left deltoid area with some swelling>  Informed RN     Other Treatments:    Time:  1700-1745  (45 min)  Individual session:  Provided AAROM and NMRE to LUE.  Educated wife, Samuel Shaw to exercises to do with left arm when pt is not in therapy.  She returned demonstration with minimal instructional cues.     See FIM for current functional status  Therapy/Group: Individual Therapy  Lisa Roca 07/11/2014, 8:11 AM

## 2014-07-11 NOTE — Progress Notes (Signed)
Speech Language Pathology Daily Session Note  Patient Details  Name: Samuel Shaw MRN: [id number] Date of Birth: July 04, 1960  Today's Date: 07/11/2014 SLP Individual Time: 1000-1045 SLP Individual Time Calculation (min): 45 min  Short Term Goals: Week 1: SLP Short Term Goal 1 (Week 1): Patient will attend to left field of enviornment during functional tasks with Min A multimodal cues.  SLP Short Term Goal 2 (Week 1): Patient will demonstrate functional problem solving for basic and familiar tasks with supervision multimodal cues.  SLP Short Term Goal 3 (Week 1): Patient will sustain attention to a functional task for 15 minutes with Min A multimodal cues for redirection.  SLP Short Term Goal 4 (Week 1): Patient will identify 1 cognitive deficit with Min A multimodal cues.  SLP Short Term Goal 5 (Week 1): Patient will utilize call bell to express wants/needs with supervision multimodal cues.  SLP Short Term Goal 6 (Week 1): Patient will consume current diet with minimal overt s/s of aspiration with supervision multimodal cues for use of swallow strategies .  Skilled Therapeutic Interventions: Skilled treatment session focused on addressing cognitive-linguistic goals.  SLP facilitated session with introduction to new learning task and Min question cues to utilize external aids for recall of procedures.  Patient also required Max question cues to attend to and initiate turn.  No left inattention was observed during this structured task; however, patient required Max cues to attend to left upper extremity during wheelchair mobility task at the end of the session. Continue with current plan of care.    FIM:  Comprehension Comprehension Mode: Auditory Comprehension: 4-Understands basic 75 - 89% of the time/requires cueing 10 - 24% of the time Expression Expression Mode: Verbal Expression: 5-Expresses basic 90% of the time/requires cueing < 10% of the time. Social Interaction Social  Interaction: 3-Interacts appropriately 50 - 74% of the time - May be physically or verbally inappropriate. Problem Solving Problem Solving: 2-Solves basic 25 - 49% of the time - needs direction more than half the time to initiate, plan or complete simple activities Memory Memory: 3-Recognizes or recalls 50 - 74% of the time/requires cueing 25 - 49% of the time  Pain Pain Assessment Pain Assessment: No/denies pain  Therapy/Group: Individual Therapy  Carmelia Roller., [place] [phone]  [place] 07/11/2014, 12:08 PM

## 2014-07-11 NOTE — Progress Notes (Signed)
Subjective/Complaints: Just finished showering, no pain complaints no problems with breathing, slept okay Review of Systems - Negative except tired Objective: Vital Signs: Blood pressure 113/69, pulse 51, temperature 97.9 F (36.6 C), temperature source Oral, resp. rate 17, height 6' 2"  (1.88 m), SpO2 98 %. No results found. Results for orders placed or performed during the hospital encounter of 07/08/14 (from the past 72 hour(s))  CBC     Status: None   Collection Time: 07/08/14  5:25 PM  Result Value Ref Range   WBC 5.7 4.0 - 10.5 K/uL   RBC 4.94 4.22 - 5.81 MIL/uL   Hemoglobin 15.8 13.0 - 17.0 g/dL   HCT 44.4 39.0 - 52.0 %   MCV 89.9 78.0 - 100.0 fL   MCH 32.0 26.0 - 34.0 pg   MCHC 35.6 30.0 - 36.0 g/dL   RDW 12.9 11.5 - 15.5 %   Platelets 197 150 - 400 K/uL  Creatinine, serum     Status: Abnormal   Collection Time: 07/08/14  5:25 PM  Result Value Ref Range   Creatinine, Ser 1.11 0.50 - 1.35 mg/dL   GFR calc non Af Amer 74 (L) >90 mL/min   GFR calc Af Amer 86 (L) >90 mL/min    Comment: (NOTE) The eGFR has been calculated using the CKD EPI equation. This calculation has not been validated in all clinical situations. eGFR's persistently <90 mL/min signify possible Chronic Kidney Disease.   CBC WITH DIFFERENTIAL     Status: Abnormal   Collection Time: 07/09/14  5:50 AM  Result Value Ref Range   WBC 5.6 4.0 - 10.5 K/uL   RBC 5.04 4.22 - 5.81 MIL/uL   Hemoglobin 16.0 13.0 - 17.0 g/dL   HCT 45.1 39.0 - 52.0 %   MCV 89.5 78.0 - 100.0 fL   MCH 31.7 26.0 - 34.0 pg   MCHC 35.5 30.0 - 36.0 g/dL   RDW 13.0 11.5 - 15.5 %   Platelets 208 150 - 400 K/uL   Neutrophils Relative % 26 (L) 43 - 77 %   Neutro Abs 1.5 (L) 1.7 - 7.7 K/uL   Lymphocytes Relative 63 (H) 12 - 46 %   Lymphs Abs 3.5 0.7 - 4.0 K/uL   Monocytes Relative 8 3 - 12 %   Monocytes Absolute 0.5 0.1 - 1.0 K/uL   Eosinophils Relative 3 0 - 5 %   Eosinophils Absolute 0.1 0.0 - 0.7 K/uL   Basophils Relative 0 0 - 1  %   Basophils Absolute 0.0 0.0 - 0.1 K/uL  Comprehensive metabolic panel     Status: Abnormal   Collection Time: 07/09/14  5:50 AM  Result Value Ref Range   Sodium 137 135 - 145 mmol/L    Comment: Please note change in reference range.   Potassium 4.1 3.5 - 5.1 mmol/L    Comment: Please note change in reference range.   Chloride 106 96 - 112 mEq/L   CO2 22 19 - 32 mmol/L   Glucose, Bld 109 (H) 70 - 99 mg/dL   BUN 9 6 - 23 mg/dL   Creatinine, Ser 1.09 0.50 - 1.35 mg/dL   Calcium 9.3 8.4 - 10.5 mg/dL   Total Protein 6.6 6.0 - 8.3 g/dL   Albumin 3.9 3.5 - 5.2 g/dL   AST 23 0 - 37 U/L   ALT 20 0 - 53 U/L   Alkaline Phosphatase 48 39 - 117 U/L   Total Bilirubin 1.2 0.3 - 1.2 mg/dL  GFR calc non Af Amer 76 (L) >90 mL/min   GFR calc Af Amer 88 (L) >90 mL/min    Comment: (NOTE) The eGFR has been calculated using the CKD EPI equation. This calculation has not been validated in all clinical situations. eGFR's persistently <90 mL/min signify possible Chronic Kidney Disease.    Anion gap 9 5 - 15     HEENT: normal Cardio: RRR and no murmur Resp: CTA B/L and unlabored GI: BS positive and NT,ND Extremity:  Pulses positive and No Edema  Neuro: Lethargic, Flat, Cranial Nerve II-XII normal, Abnormal Sensory reduced LT left foot, Abnormal Motor 2- LDelt, 3- Left bi, tri, grip, Abnormal FMC Ataxic/ dec FMC, Dysarthric and Inattention Musc/Skel:  Normal Gen NAD   Assessment/Plan: 1. Functional deficits secondary to Right MCA infarct with Left HP which require 3+ hours per day of interdisciplinary therapy in a comprehensive inpatient rehab setting. Physiatrist is providing close team supervision and 24 hour management of active medical problems listed below. Physiatrist and rehab team continue to assess barriers to discharge/monitor patient progress toward functional and medical goals. FIM: FIM - Bathing Bathing Steps Patient Completed: Chest, Left Arm, Abdomen, Right upper leg, Left  upper leg, Right lower leg (including foot), Front perineal area Bathing: 3: Mod-Patient completes 5-7 4f10 parts or 50-74%  FIM - Upper Body Dressing/Undressing Upper body dressing/undressing steps patient completed: Thread/unthread right sleeve of pullover shirt/dresss, Put head through opening of pull over shirt/dress Upper body dressing/undressing: 3: Mod-Patient completed 50-74% of tasks FIM - Lower Body Dressing/Undressing Lower body dressing/undressing steps patient completed: Thread/unthread right underwear leg, Thread/unthread right pants leg Lower body dressing/undressing: 2: Max-Patient completed 25-49% of tasks  FIM - Toileting Toileting: 1: Total-Patient completed zero steps, helper did all 3  FIM - TRadio producerDevices: BRecruitment consultantTransfers: 3-To toilet/BSC: Mod A (lift or lower assist), 3-From toilet/BSC: Mod A (lift or lower assist)  FIM - BControl and instrumentation engineerDevices: Arm rests Bed/Chair Transfer: 3: Bed > Chair or W/C: Mod A (lift or lower assist), 4: Chair or W/C > Bed: Min A (steadying Pt. > 75%), 4: Supine > Sit: Min A (steadying Pt. > 75%/lift 1 leg)  FIM - Locomotion: Wheelchair Distance: 150 Locomotion: Wheelchair: 5: Travels 150 ft or more: maneuvers on rugs and over door sills with supervision, cueing or coaxing FIM - Locomotion: Ambulation Locomotion: Ambulation Assistive Devices: Walker - Hemi, Orthosis (L AFO) Ambulation/Gait Assistance: 2: Max assist Locomotion: Ambulation: 2: Travels 50 - 149 ft with maximal assistance (Pt: 25 - 49%)  Comprehension Comprehension Mode: Auditory Comprehension: 4-Understands basic 75 - 89% of the time/requires cueing 10 - 24% of the time  Expression Expression Mode: Verbal Expression: 5-Expresses basic 90% of the time/requires cueing < 10% of the time.  Social Interaction Social Interaction: 3-Interacts appropriately 50 - 74% of the time - May be  physically or verbally inappropriate.  Problem Solving Problem Solving: 2-Solves basic 25 - 49% of the time - needs direction more than half the time to initiate, plan or complete simple activities  Memory Memory: 3-Recognizes or recalls 50 - 74% of the time/requires cueing 25 - 49% of the time   Medical Problem List and Plan: 1. 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 DVT Prophylaxis/Anticoagulation: Subcutaneous heparin. Monitor platelet counts have any signs of bleeding 3. Pain Management: Tylenol as needed 4. Tobacco abuse. Nicoderm patch. Provide counseling 5.  Neuropsych: This patient is capable of making decisions on his own behalf. 6. Skin/Wound Care: Routine skin checks 7. Fluids/Electrolytes/Nutrition: Strict I and O in follow of chemistries 8. Hyperlipidemia. Lipitor 9.  Sinus bradycardia asymptomatic no meds which contribute on list LOS (Days) 3 A FACE TO FACE EVALUATION WAS PERFORMED  Samuel Shaw E 07/11/2014, 10:09 AM

## 2014-07-12 ENCOUNTER — Inpatient Hospital Stay (HOSPITAL_COMMUNITY): Payer: BLUE CROSS/BLUE SHIELD | Admitting: Physical Therapy

## 2014-07-12 DIAGNOSIS — R001 Bradycardia, unspecified: Secondary | ICD-10-CM

## 2014-07-12 NOTE — Progress Notes (Signed)
Subjective/Complaints: Just finished showering, no pain complaints no problems with breathing, slept okay Review of Systems - Negative except tired Objective: Vital Signs: Blood pressure 137/75, pulse 66, temperature 98.7 F (37.1 C), temperature source Oral, resp. rate 17, height 6' 2"  (1.88 m), SpO2 98 %. No results found. No results found for this or any previous visit (from the past 72 hour(s)).   HEENT: normal Cardio: RRR and no murmur Resp: CTA B/L and unlabored GI: BS positive and NT,ND Extremity:  Pulses positive and No Edema  Neuro: Lethargic, Flat, Cranial Nerve II-XII normal, Abnormal Sensory reduced LT left foot, Abnormal Motor 3- LDelt, 3- Left bi, tri, grip,4- L KE,3-HF, 2- ADF Abnormal FMC Ataxic/ dec FMC, Dysarthric and Inattention Musc/Skel:  Normal Gen NAD   Assessment/Plan: 1. Functional deficits secondary to Right MCA infarct with Left HP which require 3+ hours per day of interdisciplinary therapy in a comprehensive inpatient rehab setting. Physiatrist is providing close team supervision and 24 hour management of active medical problems listed below. Physiatrist and rehab team continue to assess barriers to discharge/monitor patient progress toward functional and medical goals. FIM: FIM - Bathing Bathing Steps Patient Completed: Chest, Left Arm, Abdomen, Right upper leg, Left upper leg, Right lower leg (including foot), Front perineal area Bathing: 3: Mod-Patient completes 5-7 33f10 parts or 50-74%  FIM - Upper Body Dressing/Undressing Upper body dressing/undressing steps patient completed: Thread/unthread right sleeve of pullover shirt/dresss, Put head through opening of pull over shirt/dress Upper body dressing/undressing: 3: Mod-Patient completed 50-74% of tasks FIM - Lower Body Dressing/Undressing Lower body dressing/undressing steps patient completed: Thread/unthread right underwear leg, Thread/unthread right pants leg Lower body dressing/undressing: 2:  Max-Patient completed 25-49% of tasks  FIM - Toileting Toileting: 1: Total-Patient completed zero steps, helper did all 3  FIM - TRadio producerDevices: Bedside commode Toilet Transfers: 3-To toilet/BSC: Mod A (lift or lower assist), 3-From toilet/BSC: Mod A (lift or lower assist)  FIM - BControl and instrumentation engineerDevices: Walker, Arm rests, Orthosis Bed/Chair Transfer: 3: Chair or W/C > Bed: Mod A (lift or lower assist)  FIM - Locomotion: Wheelchair Distance: 75 Locomotion: Wheelchair: 2: Travels 50 - 149 ft with moderate assistance (Pt: 50 - 74%) FIM - Locomotion: Ambulation Locomotion: Ambulation Assistive Devices: WAdministrator Orthosis Ambulation/Gait Assistance: 1: +2 Total assist Locomotion: Ambulation: 1: Two helpers  Comprehension Comprehension Mode: Auditory Comprehension: 4-Understands basic 75 - 89% of the time/requires cueing 10 - 24% of the time  Expression Expression Mode: Verbal Expression: 5-Expresses basic 90% of the time/requires cueing < 10% of the time.  Social Interaction Social Interaction: 4-Interacts appropriately 75 - 89% of the time - Needs redirection for appropriate language or to initiate interaction.  Problem Solving Problem Solving: 2-Solves basic 25 - 49% of the time - needs direction more than half the time to initiate, plan or complete simple activities  Memory Memory: 3-Recognizes or recalls 50 - 74% of the time/requires cueing 25 - 49% of the time   Medical Problem List and Plan: 1. 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 DVT Prophylaxis/Anticoagulation: Subcutaneous heparin. Monitor platelet counts have any signs of bleeding 3. Pain Management: Tylenol as needed 4. Tobacco abuse. Nicoderm patch. Provide counseling 5. Neuropsych: This patient is capable of making decisions on his  own behalf. 6. Skin/Wound Care: Routine skin checks, Callus, chronic pressure area plantar surfacr left foot prox to met head 7.  Fluids/Electrolytes/Nutrition: Strict I and O in follow of chemistries 8. Hyperlipidemia. Lipitor 9.  Sinus bradycardia asymptomatic no meds which contribute on list LOS (Days) 4 A FACE TO FACE EVALUATION WAS PERFORMED  Samuel Shaw 07/12/2014, 9:55 AM

## 2014-07-12 NOTE — Progress Notes (Signed)
Physical Therapy Session Note  Patient Details  Name: Samuel Shaw MRN: [id number] Date of Birth: [birth year]  Today's Date: 07/12/2014 PT Individual Time: 1005-1106 PT Individual Time Calculation (min): 61 min   Short Term Goals: Week 1:  PT Short Term Goal 1 (Week 1): Pt will perform bed <> w/c transfers with consistent min A. PT Short Term Goal 2 (Week 1): Pt will propel w/c 150 ft with mod I.  PT Short Term Goal 3 (Week 1): Pt will perform dynamic standing x 5 min with min A.  PT Short Term Goal 4 (Week 1): Pt will ambulate 150 ft using LRAD with mod A.  PT Short Term Goal 5 (Week 1): Pt will negotiate up/down 6 stairs using L rail ascending with mod A.   Skilled Therapeutic Interventions/Progress Updates:   Pt received in bed; performed supine > sit with supervision-min A with one verbal cue to initiate.  Pt donned shoes EOB with min-mod A and transferred to w/c squat pivot min-mod A to guide.  Performed w/c mobility with R hemi-technique x 150' with supervision and verbal cues to sequence turns.  In gym performed NMR for gait and stair negotiation; see below.  Returned to room and performed squat pivot to recliner with min-mod A; pt left with all items within reach.    Therapy Documentation Precautions:  Precautions Precautions: Fall Restrictions Weight Bearing Restrictions: No Pain: Pain Assessment Pain Score: 0-No pain Locomotion : Ambulation Ambulation/Gait Assistance: 2: Max Financial controller Distance: 150  Other Treatments: Treatments Neuromuscular Facilitation: Left;Upper Extremity;Lower Extremity;Activity to increase coordination;Activity to increase motor control;Activity to increase timing and sequencing;Activity to increase sustained activation;Activity to increase lateral weight shifting;Activity to increase anterior-posterior weight shifting during gait x 20' x 2 reps with UE support on RW and L orthosis with max A with therapist providing verbal cues  and tactile facilitation for lateral and anterior weight shifting, positioning of LLE once pt advanced, stabilization and cues for activation of LLE in stance to allow full step length and weight shift with RLE.  Performed sit <> stand from w/c 2-3x without use of UE for more symmetrical and increased use of LLE during transitions.  In standing with UE support on rails performed lateral weight shifting and activation of LLE in stance during R toe taps to 4" step x 8 reps and then transitioned to R weight shifting and focus on LLE flexion and clearance to tap 4" step with mod A.  Performed up/down 5 steps with bilat UE support on rails with max A with therapist providing total verbal and tactile cues for sequencing and assistance for weight shifting prior to advancement and stabilization of LLE in stance; pt required 50% assistance to advance LLE to next step.    See FIM for current functional status  Therapy/Group: Individual Therapy  Raylene Everts [facility] 07/12/2014, 12:46 PM

## 2014-07-13 ENCOUNTER — Inpatient Hospital Stay (HOSPITAL_COMMUNITY): Payer: BLUE CROSS/BLUE SHIELD

## 2014-07-13 ENCOUNTER — Inpatient Hospital Stay (HOSPITAL_COMMUNITY): Payer: BLUE CROSS/BLUE SHIELD | Admitting: Speech Pathology

## 2014-07-13 ENCOUNTER — Inpatient Hospital Stay (HOSPITAL_COMMUNITY): Payer: BLUE CROSS/BLUE SHIELD | Admitting: Physical Therapy

## 2014-07-13 NOTE — Progress Notes (Signed)
Speech Language Pathology Daily Session Note  Patient Details  Name: Samuel Shaw MRN: [id number] Date of Birth: April 28, 1961  Today's Date: 07/13/2014 SLP Individual Time: 0900-1000 SLP Individual Time Calculation (min): 60 min  Short Term Goals: Week 1: SLP Short Term Goal 1 (Week 1): Patient will attend to left field of enviornment during functional tasks with Min A multimodal cues.  SLP Short Term Goal 2 (Week 1): Patient will demonstrate functional problem solving for basic and familiar tasks with supervision multimodal cues.  SLP Short Term Goal 3 (Week 1): Patient will sustain attention to a functional task for 15 minutes with Min A multimodal cues for redirection.  SLP Short Term Goal 4 (Week 1): Patient will identify 1 cognitive deficit with Min A multimodal cues.  SLP Short Term Goal 5 (Week 1): Patient will utilize call bell to express wants/needs with supervision multimodal cues.  SLP Short Term Goal 6 (Week 1): Patient will consume current diet with minimal overt s/s of aspiration with supervision multimodal cues for use of swallow strategies .  Skilled Therapeutic Interventions:  Pt was seen for skilled ST targeting goals for dysphagia and cognition.  Upon arrival, pt was in bed, asleep, but easily awakened with voice and light touch.  Pt was agreeable to participate in ST with min-mod encouragement.  Pt required overall mod assist cues for initiation and sequencing of transfer from bed to wheelchair to facilitate alertness and attention during structured tasks.  SLP then completed skilled observations with presentations of pt's currently prescribed diet with pt exhibiting intermittent anterior loss of dys 3 solids and thin liquids due to decreased labial seal.  Throughout the course of pt's meal, pt also benefited from min cues to ask for assistance as needed for tray set up. No overt s/s of aspiration were noted with solid or liquid consistencies and pt cleared mild solid residuals  from the oral cavity post swallow with supervision cues for use of liquid wash.  Upon completion of meal, SLP facilitated the session with max cues for intellectual awareness of cognitive and/or physical deficits occurring post CVA.  Pt also required min cues to locate items on the left side of the environment during a basic scavenger hunt of the unit.  Continue per current plan of care.    FIM:  Comprehension Comprehension Mode: Auditory Comprehension: 4-Understands basic 75 - 89% of the time/requires cueing 10 - 24% of the time Expression Expression Mode: Verbal Expression: 4-Expresses basic 75 - 89% of the time/requires cueing 10 - 24% of the time. Needs helper to occlude trach/needs to repeat words. Social Interaction Social Interaction: 4-Interacts appropriately 75 - 89% of the time - Needs redirection for appropriate language or to initiate interaction. Problem Solving Problem Solving: 3-Solves basic 50 - 74% of the time/requires cueing 25 - 49% of the time Memory Memory: 2-Recognizes or recalls 25 - 49% of the time/requires cueing 51 - 75% of the time FIM - Eating Eating Activity: 5: Supervision/cues;5: Set-up assist for open containers  Pain Pain Assessment Pain Assessment: No/denies pain  Therapy/Group: Individual Therapy  Samuel Shaw, Selinda Orion 07/13/2014, 3:56 PM

## 2014-07-13 NOTE — Progress Notes (Signed)
Social Work Patient ID: Samuel Shaw, male   DOB: September 19, 1960, 54 y.o.   MRN: [id number] Met with wife to discuss and answer questions regarding disability and supplied with a letter from MD regarding pt's stay here. She is concerned about their bills and wanting to apply for disability.  She plans to start application on-line and this worker will assist her With this.  He does have a PCP-Gerald Hill and had an appointment will cancel since he still will be here.  Will work with on disability and other resources They may be eligible for.

## 2014-07-13 NOTE — Progress Notes (Signed)
Physical Therapy Session Note  Patient Details  Name: Samuel Shaw MRN: [id number] Date of Birth: Mar 01, 1961  Today's Date: 07/13/2014 PT Individual Time: 1510-1610 PT Individual Time Calculation (min): 60 min   Short Term Goals: Week 1:  PT Short Term Goal 1 (Week 1): Pt will perform bed <> w/c transfers with consistent min A. PT Short Term Goal 2 (Week 1): Pt will propel w/c 150 ft with mod I.  PT Short Term Goal 3 (Week 1): Pt will perform dynamic standing x 5 min with min A.  PT Short Term Goal 4 (Week 1): Pt will ambulate 150 ft using LRAD with mod A.  PT Short Term Goal 5 (Week 1): Pt will negotiate up/down 6 stairs using L rail ascending with mod A.   Skilled Therapeutic Interventions/Progress Updates:   Pt received supine in bed. Transferred supine > sit edge of bed with supervision and donned shoes and L AFO with total A. Performed squat pivot transfer bed > w/c with min-mod A. Pt propelled wheelchair 2 x 150 ft using R hemi technique with supervision. Pt requires step-by-step cues for locking brakes, removing foot rest, scooting to edge of chair, and standing. Pt requires cues to push up on knees for sit <> stand and B foot placement to facilitate increased L LE WB. Gait training using RW x 15 ft with max A and using hemiwalker x 60 ft with mod A. Pt requires manual facilitation for weight shifting laterally and anterior with therapist providing max cues/stabilizing L knee extension during stance, verbal/tactile cues for upright posture, and therapist blocking posterior rotation of L hip. Pt with improved placement of LLE and advancing hemiwalker this date compared to last week. Pt negotiated up/down 5 stairs using R rail with mod A and total cues for sequencing step-to pattern, therapist stabilizing LLE in stance, no assist needed to place LLE on next step. For NMR with focus on LLE extension during stance, with LLE on 5" step pt performed step up/down with RLE and RUE supported. With  RUE support removed, patient attempted step taps with RLE and therapist stabilizing LLE in stance with max A. Pt continuously attempting to use R rail to pull himself up on step instead of stabilize LLE before stepping with RLE. Pt left sitting in wheelchair with quick release belt on and call bell within reach, wife asleep in room.   Therapy Documentation Precautions:  Precautions Precautions: Fall Restrictions Weight Bearing Restrictions: No Vital Signs: Therapy Vitals Temp: 98.5 F (36.9 C) Temp Source: Oral Pulse Rate: (!) 58 Resp: 16 BP: 107/76 mmHg Patient Position (if appropriate): Lying Oxygen Therapy SpO2: 99 % O2 Device: Not Delivered Pain: Pain Assessment Pain Assessment: No/denies pain Locomotion : Ambulation Ambulation/Gait Assistance: 3: Mod assist Wheelchair Mobility Distance: 150   See FIM for current functional status  Therapy/Group: Individual Therapy  Laretta Alstrom 07/13/2014, 4:19 PM

## 2014-07-13 NOTE — Progress Notes (Signed)
Subjective/Complaints: No problems overnight per patient order per nursing. Had a good BM this morning. Review of Systems - Negative except tired Objective: Vital Signs: Blood pressure 129/78, pulse 65, temperature 98 F (36.7 C), temperature source Oral, resp. rate 17, height 6' 2"  (1.88 m), SpO2 100 %. No results found. No results found for this or any previous visit (from the past 72 hour(s)).   HEENT: normal Cardio: RRR and no murmur Resp: CTA B/L and unlabored GI: BS positive and NT,ND Extremity:  Pulses positive and No Edema  Neuro: Lethargic, Flat, Cranial Nerve II-XII normal, Abnormal Sensory reduced LT left foot, Abnormal Motor 3- LDelt, 3- Left bi, tri, grip,4- L KE,3-HF, 2- ADF Abnormal FMC Ataxic/ dec FMC, Dysarthric and Inattention Musc/Skel:  Normal Gen NAD   Assessment/Plan: 1. Functional deficits secondary to Right MCA infarct with Left HP which require 3+ hours per day of interdisciplinary therapy in a comprehensive inpatient rehab setting. Physiatrist is providing close team supervision and 24 hour management of active medical problems listed below. Physiatrist and rehab team continue to assess barriers to discharge/monitor patient progress toward functional and medical goals. FIM: FIM - Bathing Bathing Steps Patient Completed: Chest, Left Arm, Abdomen, Right upper leg, Left upper leg, Right lower leg (including foot), Front perineal area Bathing: 3: Mod-Patient completes 5-7 63f10 parts or 50-74%  FIM - Upper Body Dressing/Undressing Upper body dressing/undressing steps patient completed: Thread/unthread right sleeve of pullover shirt/dresss, Put head through opening of pull over shirt/dress Upper body dressing/undressing: 3: Mod-Patient completed 50-74% of tasks FIM - Lower Body Dressing/Undressing Lower body dressing/undressing steps patient completed: Thread/unthread right underwear leg, Thread/unthread right pants leg Lower body dressing/undressing: 2:  Max-Patient completed 25-49% of tasks  FIM - Toileting Toileting: 1: Total-Patient completed zero steps, helper did all 3  FIM - TRadio producerDevices: BRecruitment consultantTransfers: 3-To toilet/BSC: Mod A (lift or lower assist), 3-From toilet/BSC: Mod A (lift or lower assist)  FIM - BControl and instrumentation engineerDevices: Arm rests Bed/Chair Transfer: 4: Supine > Sit: Min A (steadying Pt. > 75%/lift 1 leg), 4: Bed > Chair or W/C: Min A (steadying Pt. > 75%), 4: Chair or W/C > Bed: Min A (steadying Pt. > 75%)  FIM - Locomotion: Wheelchair Distance: 150 Locomotion: Wheelchair: 5: Travels 150 ft or more: maneuvers on rugs and over door sills with supervision, cueing or coaxing FIM - Locomotion: Ambulation Locomotion: Ambulation Assistive Devices: WAdministrator Orthosis Ambulation/Gait Assistance: 2: Max assist Locomotion: Ambulation: 1: Travels less than 50 ft with maximal assistance (Pt: 25 - 49%)  Comprehension Comprehension Mode: Auditory Comprehension: 4-Understands basic 75 - 89% of the time/requires cueing 10 - 24% of the time  Expression Expression Mode: Verbal Expression: 4-Expresses basic 75 - 89% of the time/requires cueing 10 - 24% of the time. Needs helper to occlude trach/needs to repeat words.  Social Interaction Social Interaction: 4-Interacts appropriately 75 - 89% of the time - Needs redirection for appropriate language or to initiate interaction.  Problem Solving Problem Solving: 2-Solves basic 25 - 49% of the time - needs direction more than half the time to initiate, plan or complete simple activities  Memory Memory: 3-Recognizes or recalls 50 - 74% of the time/requires cueing 25 - 49% of the time   Medical Problem List and Plan: 1. 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 DVT Prophylaxis/Anticoagulation:  Subcutaneous heparin. Monitor platelet counts have  any signs of bleeding 3. Pain Management: Tylenol as needed 4. Tobacco abuse. Nicoderm patch. Provide counseling 5. Neuropsych: This patient is capable of making decisions on his own behalf. 6. Skin/Wound Care: Routine skin checks, Callus, chronic pressure area plantar surfacr left foot prox to met head 7. Fluids/Electrolytes/Nutrition: Strict I and O in follow of chemistries 8. Hyperlipidemia. Lipitor 9.  Sinus bradycardia asymptomatic no meds which contribute on list LOS (Days) 5 A FACE TO FACE EVALUATION WAS PERFORMED  KIRSTEINS,ANDREW E 07/13/2014, 8:51 AM

## 2014-07-13 NOTE — Progress Notes (Signed)
Occupational Therapy Session Note  Patient Details  Name: Samuel Shaw MRN: [id number] Date of Birth: 15-Nov-1960  Today's Date: 07/13/2014 OT Individual Time: 1100-1200 OT Individual Time Calculation (min): 60 min    Short Term Goals: Week 1:  OT Short Term Goal 1 (Week 1): Pt will complete all bathing with min assist sit to stand. OT Short Term Goal 2 (Week 1): Pt will maintain sustained attention and complete bathing with no more than mod questioning cues. OT Short Term Goal 3 (Week 1): Pt will perform walk-in shower transfers with min assist stand pivot. OT Short Term Goal 4 (Week 1): Pt will use the LUE as an acitve assist for bathing with min assist. OT Short Term Goal 5 (Week 1): Pt will perform toilet transfers to elevated toilet or 3:1 with min assist.   Skilled Therapeutic Interventions/Progress Updates: ADL-retraining with emphasis on adapted bathing/dressing skills, AE training (wash mit), weight shifting, sit>stand, and attention to left.   Pt received seated in his w/c with guest present.   Pt re-educated on use of BADL as method to facilitate rehab goals and patient agreed to engage in shower level bathing and dressing tasks.    Pt ambulated to shower chair using RW with mod-max assist to weight shift, maintain balance, and to advance left lower leg.   Pt able to lower from standing to sitting to shower chair with only steadying assist for safety and min vc to turn and use grab bar.   Pt attempted use of LUE to wash legs however he was unable to grasp wash cloth.   OT provided wash mit and educated pt on potential use of rope to hold soap around his neck, as he dropped his bar of soap 4 times during bathing.    Pt required min vc to provoke progression through sequence of bathing and min assist to wash buttocks while pt stood supported using grab bar.   Pt completed sit> 3 times while bathing with overall min assist to maintain balance.   Pt returned to w/c after bathing and dressed  beside bed, using bed rail for supported standing to allow assist with donning brief and pants.   Pt was educated on hemi dressing skills and progressed with lacing briefs using leg crossed over leg.   Pt was unable to coordinate hemi dressing technique with upper body dressing during session d/t modifying recommended method.    Pt left in w/c at end of session, QRB attached and call light within reach.     Therapy Documentation Precautions:  Precautions Precautions: Fall Restrictions Weight Bearing Restrictions: No  Pain: Pain Assessment Pain Assessment: No/denies pain  See FIM for current functional status  Therapy/Group: Individual Therapy  [place] 07/13/2014, 12:24 PM

## 2014-07-14 ENCOUNTER — Encounter (HOSPITAL_COMMUNITY): Payer: BLUE CROSS/BLUE SHIELD | Admitting: Occupational Therapy

## 2014-07-14 ENCOUNTER — Inpatient Hospital Stay (HOSPITAL_COMMUNITY): Payer: BLUE CROSS/BLUE SHIELD | Admitting: Physical Therapy

## 2014-07-14 ENCOUNTER — Inpatient Hospital Stay (HOSPITAL_COMMUNITY): Payer: BLUE CROSS/BLUE SHIELD | Admitting: Speech Pathology

## 2014-07-14 NOTE — Progress Notes (Signed)
Speech Language Pathology Daily Session Note  Patient Details  Name: Samuel Shaw MRN: [id number] Date of Birth: [birth year]  Today's Date: 07/14/2014 SLP Individual Time: 0932-3557 SLP Individual Time Calculation (min): 50 min  Short Term Goals: Week 1: SLP Short Term Goal 1 (Week 1): Patient will attend to left field of enviornment during functional tasks with Min A multimodal cues.  SLP Short Term Goal 2 (Week 1): Patient will demonstrate functional problem solving for basic and familiar tasks with supervision multimodal cues.  SLP Short Term Goal 3 (Week 1): Patient will sustain attention to a functional task for 15 minutes with Min A multimodal cues for redirection.  SLP Short Term Goal 4 (Week 1): Patient will identify 1 cognitive deficit with Min A multimodal cues.  SLP Short Term Goal 5 (Week 1): Patient will utilize call bell to express wants/needs with supervision multimodal cues.  SLP Short Term Goal 6 (Week 1): Patient will consume current diet with minimal overt s/s of aspiration with supervision multimodal cues for use of swallow strategies .  Skilled Therapeutic Interventions: Skilled treatment session focused on addressing cognitive-linguistic goals.  SLP facilitated session with Max question cues to recall sequence of bed to chair transfer, Max verbal cues to initiate each step and Max multimodal cues to attend to left extremities during task.  Patient was able to independently list 4 things that he was working on in therapy but when asked why only correlated walking to working on his left leg.  He denied other deficits in left arm, speech and thinking despite working on them in therapies and did not mention swallowing.  SLP introduced a scavenger hunt task and identified goals being memory of 4 given locations/things and left attention.  Patient declined use of an external aid and therefore required Max multimodal cues to recall 2/4 as well as Mod cues for left attention to  environment and left upper extremity.  During chair to bed transfer at end of session patient required Max multimodal cues to again recall sequence of transfer and safely problem solve through task.  Patient required Total assist for emergent awareness of when help was needed during transfer with SLP stepping in x1 for safety.  Session ended 10 minutes early due to patient refusal to further participate. Continue with current plan of care.   FIM:  Comprehension Comprehension Mode: Auditory Comprehension: 4-Understands basic 75 - 89% of the time/requires cueing 10 - 24% of the time Expression Expression Mode: Verbal Expression: 4-Expresses basic 75 - 89% of the time/requires cueing 10 - 24% of the time. Needs helper to occlude trach/needs to repeat words. Social Interaction Social Interaction: 3-Interacts appropriately 50 - 74% of the time - May be physically or verbally inappropriate. Problem Solving Problem Solving: 2-Solves basic 25 - 49% of the time - needs direction more than half the time to initiate, plan or complete simple activities Memory Memory: 2-Recognizes or recalls 25 - 49% of the time/requires cueing 51 - 75% of the time  Pain Pain Assessment Pain Assessment: No/denies pain  Therapy/Group: Individual Therapy  Carmelia Roller., CCC-SLP [phone]  [place] 07/14/2014, 2:51 PM

## 2014-07-14 NOTE — Progress Notes (Signed)
Subjective/Complaints: No issues overnite No pain Review of Systems - Negative except tired Objective: Vital Signs: Blood pressure 117/77, pulse 52, temperature 97.8 F (36.6 C), temperature source Oral, resp. rate 17, height _0  (1.88 m), SpO2 99 %. No results found. No results found for this or any previous visit (from the past 72 hour(s)).   HEENT: normal Cardio: RRR and no murmur Resp: CTA B/L and unlabored GI: BS positive and NT,ND Extremity:  Pulses positive and No Edema  Neuro: Lethargic, Flat, Cranial Nerve II-XII normal, Abnormal Sensory reduced LT left foot, Abnormal Motor 3- LDelt, 3- Left bi, tri, grip,4- L KE,3-HF, 2- ADF Abnormal FMC Ataxic/ dec FMC, Dysarthric and Inattention Musc/Skel:  Normal Gen NAD   Assessment/Plan: 1. Functional deficits secondary to Right MCA infarct with Left HP which require 3+ hours per day of interdisciplinary therapy in a comprehensive inpatient rehab setting. Physiatrist is providing close team supervision and 24 hour management of active medical problems listed below. Physiatrist and rehab team continue to assess barriers to discharge/monitor patient progress toward functional and medical goals. FIM: FIM - Bathing Bathing Steps Patient Completed: Chest, Left Arm, Right Arm, Abdomen, Front perineal area, Right upper leg, Left upper leg, Right lower leg (including foot), Left lower leg (including foot) (using wash mit) Bathing: 4: Min-Patient completes 8-9 42f10 parts or 75+ percent  FIM - Upper Body Dressing/Undressing Upper body dressing/undressing steps patient completed: Thread/unthread left sleeve of pullover shirt/dress, Put head through opening of pull over shirt/dress, Pull shirt over trunk Upper body dressing/undressing: 4: Min-Patient completed 75 plus % of tasks FIM - Lower Body Dressing/Undressing Lower body dressing/undressing steps patient completed: Thread/unthread right pants leg, Don/Doff right sock Lower body  dressing/undressing: 1: Total-Patient completed less than 25% of tasks  FIM - Toileting Toileting: 1: Total-Patient completed zero steps, helper did all 3  FIM - TRadio producerDevices: BRecruitment consultantTransfers: 3-To toilet/BSC: Mod A (lift or lower assist), 3-From toilet/BSC: Mod A (lift or lower assist)  FIM - BControl and instrumentation engineerDevices: Arm rests Bed/Chair Transfer: 5: Supine > Sit: Supervision (verbal cues/safety issues), 3: Bed > Chair or W/C: Mod A (lift or lower assist)  FIM - Locomotion: Wheelchair Distance: 150 Locomotion: Wheelchair: 5: Travels 150 ft or more: maneuvers on rugs and over door sills with supervision, cueing or coaxing FIM - Locomotion: Ambulation Locomotion: Ambulation Assistive Devices: Orthosis, WEnvironmental consultant- Hemi Ambulation/Gait Assistance: 3: Mod assist Locomotion: Ambulation: 2: Travels 50 - 149 ft with moderate assistance (Pt: 50 - 74%)  Comprehension Comprehension Mode: Auditory Comprehension: 4-Understands basic 75 - 89% of the time/requires cueing 10 - 24% of the time  Expression Expression Mode: Verbal Expression: 4-Expresses basic 75 - 89% of the time/requires cueing 10 - 24% of the time. Needs helper to occlude trach/needs to repeat words.  Social Interaction Social Interaction: 4-Interacts appropriately 75 - 89% of the time - Needs redirection for appropriate language or to initiate interaction.  Problem Solving Problem Solving: 3-Solves basic 50 - 74% of the time/requires cueing 25 - 49% of the time  Memory Memory: 2-Recognizes or recalls 25 - 49% of the time/requires cueing 51 - 75% of the time   Medical Problem List and Plan: 1. 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 DVT Prophylaxis/Anticoagulation: Subcutaneous heparin. Monitor platelet counts have any signs of  bleeding 3. Pain Management: Tylenol as needed 4. Tobacco abuse. Nicoderm patch.  Provide counseling 5. Neuropsych: This patient is capable of making decisions on his own behalf. 6. Skin/Wound Care: Routine skin checks, Callus, chronic pressure area plantar surface left foot prox to met head 7. Fluids/Electrolytes/Nutrition: Strict I and O in follow of chemistries 8. Hyperlipidemia. Lipitor 9.  Sinus bradycardia asymptomatic no meds which contribute on list LOS (Days) 6 A FACE TO FACE EVALUATION WAS PERFORMED  Samuel Shaw 07/14/2014, 8:28 AM

## 2014-07-14 NOTE — Progress Notes (Signed)
Physical Therapy Session Note  Patient Details  Name: Samuel Shaw MRN: [id number] Date of Birth: October 19, 1960  Today's Date: 07/14/2014 PT Individual Time: 6440-3474 PT Individual Time Calculation (min): 60 min   Short Term Goals: Week 1:  PT Short Term Goal 1 (Week 1): Pt will perform bed <> w/c transfers with consistent min A. PT Short Term Goal 2 (Week 1): Pt will propel w/c 150 ft with mod I.  PT Short Term Goal 3 (Week 1): Pt will perform dynamic standing x 5 min with min A.  PT Short Term Goal 4 (Week 1): Pt will ambulate 150 ft using LRAD with mod A.  PT Short Term Goal 5 (Week 1): Pt will negotiate up/down 6 stairs using L rail ascending with mod A.   Skilled Therapeutic Interventions/Progress Updates:   Pt received sitting in wheelchair. Pt attempting to propel wheelchair without realizing that L brake still on, requires total cues to attend to L to look at brake. Pt propelled wheelchair using R hemi technique x 150 ft with supervision. Pt requires total cues for safe wheelchair set up in preparation for transfer, donning brakes, and leg rest management. Squat pivot transfer training wheelchair <> mat table with focus on utilizing LLE/foot placement, head hips relationship, and hand placement to facilitate staying low, S-min A. Gait training x 130 ft and x 90 ft using hemiwalker and L AFO at overall mod A. See NMR details for gait and sit <> stand below. Pt requires cues for hand placement for sit <> stand to push up on knees in order to WB through L side as patient is already developing compensatory learned non-use strategies. Wife present at end of session to observe pt and encouraged to attend full therapy session for training as she was giving him cues to unknowingly facilitate learned non-use. Pt left sitting in wheelchair with quick release belt on and wife present.  Therapy Documentation Precautions:  Precautions Precautions: Fall Precaution Comments: left hemiparesis, left  inattention Restrictions Weight Bearing Restrictions: No Pain: Pain Assessment Pain Assessment: No/denies pain Locomotion : Ambulation Ambulation/Gait Assistance: 3: Mod assist Wheelchair Mobility Distance: 150  Other Treatments: Treatments Neuromuscular Facilitation: Left;Upper Extremity;Lower Extremity;Activity to increase coordination;Activity to increase motor control;Activity to increase timing and sequencing;Activity to increase sustained activation;Activity to increase lateral weight shifting;Activity to increase anterior-posterior weight shifting;Forced use during gait training with manual facilitation for weight shifting laterally and therapist providing max multimodal cues/stabilizing L knee extension during stance, verbal/tactile cues for upright posture and forward gaze, and therapist blocking posterior rotation of L hip and hip hinging to compensate for L knee extension. With mirror for visual feedback, pt performed sit <> stand from mat with LLE underneath him and RLE extended in front to facilitate increased LLE activation. In standing with RLE forward and LLE back, patient worked on weight shifting to R due to left lateral lean and L knee extension with pt able to extend knee 60% of time with verbal cues. At end of session pt performed sit <> stand with feet positioned in equal stance with improved carryover of L knee extension in standing noted.  See FIM for current functional status  Therapy/Group: Individual Therapy  Laretta Alstrom 07/14/2014, 12:09 PM

## 2014-07-14 NOTE — Progress Notes (Signed)
Physical Therapy Note  Patient Details  Name: Samuel Shaw MRN: [id number] Date of Birth: 10/06/1960 Today's Date: 07/14/2014  Attempted to see patient for makeup session due to missing 10 min with SLP this date. Pt denied that he missed time and refused to participate in makeup session with this therapist. Pt educated on purpose of therapy after he stated, "They treat me like I'm stupid or something." Will f/u per POC.    Carney Living A 07/14/2014, 4:08 PM

## 2014-07-14 NOTE — Progress Notes (Signed)
RT note-documentated under wrong patient.

## 2014-07-14 NOTE — Progress Notes (Signed)
Occupational Therapy Session Note  Patient Details  Name: Samuel Shaw MRN: [id number] Date of Birth: 08-17-1960  Today's Date: 07/14/2014 OT Individual Time: 0900-1000 OT Individual Time Calculation (min): 60 min    Short Term Goals: Week 1:  OT Short Term Goal 1 (Week 1): Pt will complete all bathing with min assist sit to stand. OT Short Term Goal 2 (Week 1): Pt will maintain sustained attention and complete bathing with no more than mod questioning cues. OT Short Term Goal 3 (Week 1): Pt will perform walk-in shower transfers with min assist stand pivot. OT Short Term Goal 4 (Week 1): Pt will use the LUE as an acitve assist for bathing with min assist. OT Short Term Goal 5 (Week 1): Pt will perform toilet transfers to elevated toilet or 3:1 with min assist.   Skilled Therapeutic Interventions/Progress Updates:    Pt performed transfer to the walk-in shower with use of the hemi-walker and mod assist.  He needed mod facilitation to maintain upright posture and to advance the LLE.  He also needs mod assist to maintain left knee extension in standing during stance phase.  Once in the shower he needed max instructional cueing to begin washing as he just held the hand held shower to the left side and did not initiate any until therapist cued him X3.  Frequent dropping of the soap as with yesterdays session.  Discussed the need for a bottle of liquid soap or soap on a rope to help decrease this.  Once he started only mod instructional cueing to sequence bathing.  Mod facilitation to integrate the LUE into the bathing task.  Helped him donn wash mit for use as well.  Pt with decreased sustained attention, as after he had washed and rinsed, he was told to stand and wash his bottom but instead attempted to wash his body again.  Therapist had to give max instructional cueing to maintain focus.  He transferred back out to the sink for dressing.  Mod instructional cueing for all dressing as pt  demonstrates difficulty remembering to dress the left side first.  Mod assist for sit to stand when pulling up pants and underpants.  Total assist for TEDs and shoes including AFO.  Educated pt on gross digit flexion/extension exercises for the left hand as well opposition to each finger.  Demonstrated AROM elbow flexion and extension as well as shoulder flexion when supine in bed.   Therapy Documentation Precautions:  Precautions Precautions: Fall Precaution Comments: left hemiparesis, left inattention Restrictions Weight Bearing Restrictions: No  Pain: Pain Assessment Pain Assessment: No/denies pain ADL: See FIM for current functional status  Therapy/Group: Individual Therapy  Mirissa Lopresti OTR/L 07/14/2014, 11:49 AM

## 2014-07-14 NOTE — Progress Notes (Addendum)
Incorrect documentation

## 2014-07-15 ENCOUNTER — Encounter (HOSPITAL_COMMUNITY): Payer: BLUE CROSS/BLUE SHIELD | Admitting: Occupational Therapy

## 2014-07-15 ENCOUNTER — Inpatient Hospital Stay (HOSPITAL_COMMUNITY): Payer: BLUE CROSS/BLUE SHIELD | Admitting: Speech Pathology

## 2014-07-15 ENCOUNTER — Inpatient Hospital Stay (HOSPITAL_COMMUNITY): Payer: BLUE CROSS/BLUE SHIELD | Admitting: Physical Therapy

## 2014-07-15 ENCOUNTER — Encounter (HOSPITAL_COMMUNITY): Payer: BLUE CROSS/BLUE SHIELD

## 2014-07-15 MED ORDER — TIZANIDINE HCL 2 MG PO TABS
2.0000 mg | ORAL_TABLET | Freq: Three times a day (TID) | ORAL | Status: DC
  Administered 2014-07-15 – 2014-07-29 (×40): 2 mg via ORAL
  Filled 2014-07-15 (×50): qty 1

## 2014-07-15 NOTE — Progress Notes (Signed)
Social Work Patient ID: Samuel Shaw, male   DOB: October 01, 1960, 54 y.o.   MRN: [id number] [employer] has approved pt until 1/20 with update 1/19.

## 2014-07-15 NOTE — Progress Notes (Signed)
Subjective/Complaints: Poor attention to the left however does respond to cueing Review of Systems - Negative except tired Objective: Vital Signs: Blood pressure 133/97, pulse 58, temperature 97.7 F (36.5 C), temperature source Oral, resp. rate 18, height _0  (1.88 m), SpO2 100 %. No results found. No results found for this or any previous visit (from the past 72 hour(s)).   HEENT: normal Cardio: RRR and no murmur Resp: CTA B/L and unlabored GI: BS positive and NT,ND Extremity:  Pulses positive and No Edema  Neuro: Lethargic, Flat, Cranial Nerve II-XII normal, Abnormal Sensory reduced LT left foot, Abnormal Motor 3- LDelt, 3- Left bi, tri, grip,4- L KE,3-HF, 2- ADF Abnormal FMC Ataxic/ dec FMC, Dysarthric and Inattention Musc/Skel:  Normal Gen NAD Mood and affect flat but otherwise appropriate  Assessment/Plan: 1. Functional deficits secondary to Right MCA infarct with Left HP which require 3+ hours per day of interdisciplinary therapy in a comprehensive inpatient rehab setting. Physiatrist is providing close team supervision and 24 hour management of active medical problems listed below. Physiatrist and rehab team continue to assess barriers to discharge/monitor patient progress toward functional and medical goals. Team conference today please see physician documentation under team conference tab, met with team face-to-face to discuss problems,progress, and goals. Formulized individual treatment plan based on medical history, underlying problem and comorbidities. FIM: FIM - Bathing Bathing Steps Patient Completed: Chest, Left Arm, Right Arm, Abdomen, Front perineal area, Right upper leg, Left upper leg, Right lower leg (including foot), Left lower leg (including foot) Bathing: 4: Min-Patient completes 8-9 [id number] parts or 75+ percent  FIM - Upper Body Dressing/Undressing Upper body dressing/undressing steps patient completed: Put head through opening of pull over shirt/dress, Pull  shirt over trunk, Thread/unthread right sleeve of pullover shirt/dresss Upper body dressing/undressing: 4: Min-Patient completed 75 plus % of tasks FIM - Lower Body Dressing/Undressing Lower body dressing/undressing steps patient completed: Thread/unthread right pants leg, Don/Doff right sock, Thread/unthread right underwear leg, Thread/unthread left underwear leg, Thread/unthread left pants leg Lower body dressing/undressing: 3: Mod-Patient completed 50-74% of tasks  FIM - Toileting Toileting: 1: Total-Patient completed zero steps, helper did all 3  FIM - TRadio producerDevices: BRecruitment consultantTransfers: 3-To toilet/BSC: Mod A (lift or lower assist), 3-From toilet/BSC: Mod A (lift or lower assist)  FIM - BControl and instrumentation engineerDevices: Arm rests Bed/Chair Transfer: 4: Bed > Chair or W/C: Min A (steadying Pt. > 75%), 4: Chair or W/C > Bed: Min A (steadying Pt. > 75%)  FIM - Locomotion: Wheelchair Distance: 150 Locomotion: Wheelchair: 5: Travels 150 ft or more: maneuvers on rugs and over door sills with supervision, cueing or coaxing FIM - Locomotion: Ambulation Locomotion: Ambulation Assistive Devices: Orthosis, WEnvironmental consultant- Hemi Ambulation/Gait Assistance: 3: Mod assist Locomotion: Ambulation: 2: Travels 50 - 149 ft with moderate assistance (Pt: 50 - 74%)  Comprehension Comprehension Mode: Auditory Comprehension: 5-Understands basic 90% of the time/requires cueing < 10% of the time  Expression Expression Mode: Verbal Expression: 5-Expresses basic 90% of the time/requires cueing < 10% of the time.  Social Interaction Social Interaction: 3-Interacts appropriately 50 - 74% of the time - May be physically or verbally inappropriate.  Problem Solving Problem Solving: 2-Solves basic 25 - 49% of the time - needs direction more than half the time to initiate, plan or complete simple activities  Memory Memory: 3-Recognizes or  recalls 50 - 74% of the time/requires cueing 25 - 49% of the time   Medical Problem  List and Plan: 1. 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 DVT Prophylaxis/Anticoagulation: Subcutaneous heparin. Monitor platelet counts have any signs of bleeding 3. Pain Management: Tylenol as needed 4. Tobacco abuse. Nicoderm patch. Provide counseling 5. Neuropsych: This patient is capable of making decisions on his own behalf. 6. Skin/Wound Care: Routine skin checks, Callus, chronic pressure area plantar surface left foot prox to met head 7. Fluids/Electrolytes/Nutrition: Strict I and O in follow of chemistries 8. Hyperlipidemia. Lipitor 9.  Sinus bradycardia asymptomatic no meds which contribute on list LOS (Days) 7 A FACE TO FACE EVALUATION WAS PERFORMED  KIRSTEINS,ANDREW E 07/15/2014, 9:25 AM

## 2014-07-15 NOTE — Progress Notes (Signed)
Orthopedic Tech Progress Note Patient Details:  Samuel Shaw [birth year] [id number]  Patient ID: Samuel Shaw, male   DOB: May 05, 1961, 54 y.o.   MRN: [id number] Called in advanced brace order; spoke with [name], [name] 07/15/2014, 12:47 PM

## 2014-07-15 NOTE — Progress Notes (Signed)
Social Work Patient ID: Samuel Shaw, male   DOB: [birth year], 54 y.o.   MRN: [id number] Met with pt, wife and daughter to discuss team conference goals-supervision level and discharge 1/27.  Asked wife to attend PT therapy with pt to get checked off on transfers. 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  All agreeable with team's recommendations and will continue to be here to participate in therapies with. Wife to look into disability application-worker has given her a form and showed her the on-line version.  She feels there is more resources out there to assist them.  Will continue to work with on Discharge needs.

## 2014-07-15 NOTE — Progress Notes (Addendum)
Occupational Therapy Weekly Progress Note  Patient Details  Name: Samuel Shaw MRN: [id number] Date of Birth: 09-24-1960  Beginning of progress report period: July 09, 2013 End of progress report period: July 15, 2013  Today's Date: 07/15/2014 OT Individual Time: 0800-0900 OT Individual Time Calculation (min): 60 min    Patient has met 3 of 4 short term goals.  He continues to demonstrate left hemiparesis in the UE and LE.  He needs overall min assist level for functional transfers and for all selfcare sit to stand except for dressing which is at a mod assist level.  He uses the LUE with mod assist as well during bathing and dressing tasks.  Slight increased tone noted in the left biceps with some motor apraxia noted when attempting use.  He exhibits difficulty incorporating elbow and digit extension together.  He continues to need mod instructional cueing to initiate and sequence through bathing secondary to cognitive deficits.  Feel he is on target for supervision level goals.  Have provided education to the pt and wife on AAROM exercises to help increase function and decrease tone.   Patient continues to demonstrate the following deficits: decreased balance, decreased cognition, decreased safety awareness, decreased LUE and LLE coordination and strength and therefore will continue to benefit from skilled OT intervention to enhance overall performance with BADL.  Patient progressing toward long term goals..  Continue plan of care.  OT Short Term Goals Week 2:  OT Short Term Goal 1 (Week 2): Pt will use the LUE as an acitve assist for bathing with min assist. OT Short Term Goal 2 (Week 2): Pt will perform toilet transfers with min guard assist to elevated toilet. OT Short Term Goal 3 (Week 2): Pt will perform UB dressing with supervision with min questioning cueing OT Short Term Goal 4 (Week 2): Pt will perform LB bathing sit to stand with supervision. OT Short Term Goal 5 (Week 2):  Pt will perform LB dressing sit to stand with min assist.    Skilled Therapeutic Interventions/Progress Updates:    Pt performed toileting and toilet transfer to begin session.  Min assist level for transfer with mod assist for hygiene and clothing management.  He was able to transfer from the toilet to the shower seat with min assist.  Mod instructional cueing to initiate and complete bathing sit to stand with min assist for standing.  Mod assist for integration of the LUE into bathing task and when attempting to pull pants and brief over hips.  Mod demonstrational cueing for sequencing and following hemi dressing techniques.  Provided shoe buttons for adaptation to tying shoes.  Finished session by having pt work on active left elbow extension into weightbearing surface.  Pt able to activate extensors in the left elbow but cannot maintain without max instructional cueing to maintain visual attention to the arm.  Instructed pt and wife to continue working on extension using bedside table to slide the arm on.    Therapy Documentation Precautions:  Precautions Precautions: Fall Precaution Comments: left hemiparesis, left inattention Restrictions Weight Bearing Restrictions: No  Pain: Pain Assessment Pain Assessment: No/denies pain ADL: See FIM for current functional status  Therapy/Group: Individual Therapy  Alania Overholt OTR/L 07/15/2014, 11:35 AM

## 2014-07-15 NOTE — Progress Notes (Signed)
Speech Language Pathology Daily Session Note  Patient Details  Name: Samuel Shaw MRN: [id number] Date of Birth: 15-Jun-1961  Today's Date: 07/15/2014 SLP Individual Time: 6761-9509 SLP Individual Time Calculation (min): 45 min  Short Term Goals: Week 1: SLP Short Term Goal 1 (Week 1): Patient will attend to left field of enviornment during functional tasks with Min A multimodal cues.  SLP Short Term Goal 2 (Week 1): Patient will demonstrate functional problem solving for basic and familiar tasks with supervision multimodal cues.  SLP Short Term Goal 3 (Week 1): Patient will sustain attention to a functional task for 15 minutes with Min A multimodal cues for redirection.  SLP Short Term Goal 4 (Week 1): Patient will identify 1 cognitive deficit with Min A multimodal cues.  SLP Short Term Goal 5 (Week 1): Patient will utilize call bell to express wants/needs with supervision multimodal cues.  SLP Short Term Goal 6 (Week 1): Patient will consume current diet with minimal overt s/s of aspiration with supervision multimodal cues for use of swallow strategies .  Skilled Therapeutic Interventions: Skilled treatment session focused on addressing cognitive-linguistic goals.  SLP facilitated session with Max question cues to recall sequence of bed to chair transfer, Max verbal cues to initiate each step and Max multimodal cues to attend to left extremities during task.  SLP offered regular texture trials for diet advancement; however, patient declined.  SLP facilitated session with Mod faded to [place] verbal cues to attend to left upper extremity during a structured task.  Patient required Max question cues to recall daily events such as therapies as well as previously discussed goals of therapy.  Chair to bed transfer at end of session resulted in SLP being able to fade cues to Mod multimodal cues to again recall sequence of transfer and safely problem solve through task with timely initiation and left  attention.  Continue with current plan of care.   FIM:  Comprehension Comprehension Mode: Auditory Comprehension: 4-Understands basic 75 - 89% of the time/requires cueing 10 - 24% of the time Expression Expression Mode: Verbal Expression: 5-Expresses basic 90% of the time/requires cueing < 10% of the time. Social Interaction Social Interaction: 3-Interacts appropriately 50 - 74% of the time - May be physically or verbally inappropriate. Problem Solving Problem Solving: 3-Solves basic 50 - 74% of the time/requires cueing 25 - 49% of the time Memory Memory: 2-Recognizes or recalls 25 - 49% of the time/requires cueing 51 - 75% of the time  Pain Pain Assessment Pain Assessment: No/denies pain  Therapy/Group: Individual Therapy  Carmelia Roller., CCC-SLP [phone]  [place] 07/15/2014, 3:18 PM

## 2014-07-15 NOTE — Progress Notes (Signed)
Physical Therapy Session Note  Patient Details  Name: Samuel Shaw MRN: [id number] Date of Birth: 01-Nov-1960  Today's Date: 07/15/2014 PT Individual Time: 5638-9373 PT Individual Time Calculation (min): 15 min   Short Term Goals: Week 1:  PT Short Term Goal 1 (Week 1): Pt will perform bed <> w/c transfers with consistent min A. PT Short Term Goal 2 (Week 1): Pt will propel w/c 150 ft with mod I.  PT Short Term Goal 3 (Week 1): Pt will perform dynamic standing x 5 min with min A.  PT Short Term Goal 4 (Week 1): Pt will ambulate 150 ft using LRAD with mod A.  PT Short Term Goal 5 (Week 1): Pt will negotiate up/down 6 stairs using L rail ascending with mod A.   Skilled Therapeutic Interventions/Progress Updates:  Make up session.  Pt asleep in darkened room with wife sleeping in cot in room.  He was easily aroused and did not object to bedside tx.  No pain reported.  neuromuscular re-education for LLE and L attetnion via VCs, manual cues for supine: -L hip abd/add to slide LE off/on bed -short arc quad knee ext -ankle PF -heel slides - hip and knee ext from flexed position -bil bridging   1 x 10 each.  Assistance provided for full AROM PRN.    Therapy Documentation Precautions:  Precautions Precautions: Fall Precaution Comments: left hemiparesis, left inattention Restrictions Weight Bearing Restrictions: No Pain: Pain Assessment Pain Assessment: No/denies pain        See FIM for current functional status  Therapy/Group: Individual Therapy  [name] 07/15/2014, 11:44 AM

## 2014-07-15 NOTE — Progress Notes (Signed)
Physical Therapy Session Note  Patient Details  Name: Samuel Shaw MRN: [id number] Date of Birth: [birth year]  Today's Date: 07/15/2014 PT Individual Time: 0935-1050 PT Individual Time Calculation (min): 75 min   Short Term Goals: Week 1:  PT Short Term Goal 1 (Week 1): Pt will perform bed <> w/c transfers with consistent min A. PT Short Term Goal 2 (Week 1): Pt will propel w/c 150 ft with mod I.  PT Short Term Goal 3 (Week 1): Pt will perform dynamic standing x 5 min with min A.  PT Short Term Goal 4 (Week 1): Pt will ambulate 150 ft using LRAD with mod A.  PT Short Term Goal 5 (Week 1): Pt will negotiate up/down 6 stairs using L rail ascending with mod A.   Skilled Therapeutic Interventions/Progress Updates:   Pt received sitting in wheelchair, agreeable to therapy. Pt propelled wheelchair using R hemi technique x 300 ft with supervision. Session focused on LiteGait treadmill training with emphasis on upright trunk posture, decreased rotation at L hip, and increased L knee extension/stability during stance phase. With L AFO donned, pt completed two trials of 6 min at 0.5 mph ambulating 270 ft and 7 min at 0.6 mph ambulating 378 ft with BUE supported on hand bars. LiteGait harness biased for increased L sided support to offweight LLE and decrease L lateral lean. Therapist manually assisting patient with advancing LLE to facilitate L knee extension during stance and verbal cues for increased R step length. Pt with excellent carryover with overground short distance ambulation using hemiwalker immediately following treadmill training with improved upright posture and increased L knee extension during stance at min assist level. Pt also noting improvement in gait mechanics with improved mood this date, stating, "I'm getting it." Plan to trial [facility] next session. Pt negotiated up/down 5 stairs using 1-2 rails with min assist to prevent patient from posteriorly rotating L hip, no cues needed to recall  sequencing. Pt requires total cues for safely turning as he tends to reach for arm rest when too far away and not back BLE up to chair. Pt returned to room, requesting to return to bed. Performed squat pivot transfer w/c > bed with min guard and verbal cues for removing arm rest and technique, sit > supine with supervision and left supine in bed with all needs within reach, wife in room.   Therapy Documentation Precautions:  Precautions Precautions: Fall Precaution Comments: left hemiparesis, left inattention Restrictions Weight Bearing Restrictions: No Pain: Pain Assessment Pain Assessment: No/denies pain Locomotion : Ambulation Ambulation/Gait Assistance: 4: Min assist   See FIM for current functional status  Therapy/Group: Individual Therapy  Laretta Alstrom 07/15/2014, 10:58 AM

## 2014-07-15 NOTE — Patient Care Conference (Signed)
Inpatient RehabilitationTeam Conference and Plan of Care Update Date: 07/15/2014   Time: 11;25 AM    Patient Name: Samuel Shaw      Medical Record Number: [id number]  Date of Birth: May 24, 1961 Sex: Male         Room/Bed: 4W14C/4W14C-01 Payor Info: Payor: [place] / Plan: BCBS OTHER / Product Type: *No Product type* /    Admitting Diagnosis: R MCA CVA  Admit Date/Time:  07/08/2014  4:24 PM Admission Comments: No comment available   Primary Diagnosis:  <principal problem not specified> Principal Problem: <principal problem not specified>  Patient Active Problem List   Diagnosis Date Noted  . Hemi-neglect of left side 07/16/2014  . Spastic hemiplegia affecting nondominant side 07/10/2014  . Cognitive deficit, post-stroke 07/10/2014  . Sinus bradycardia on ECG 07/10/2014  . 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 CVA (cerebral vascular accident)   . Tobacco abuse   . HLD (hyperlipidemia)   . CVA (cerebral infarction) 07/06/2014    Expected Discharge Date: Expected Discharge Date: 07/29/14  Team Members Present: Physician leading conference: Dr. Alysia Penna Social Worker Present: Ovidio Kin, LCSW Nurse Present: Heather Roberts, RN PT Present: Raylene Everts, PT;Iyonna Rish Jari Favre, PT OT Present: Clyda Greener, Rhetta Mura, OT SLP Present: Gunnar Fusi, SLP PPS Coordinator present : Daiva Nakayama, RN, CRRN     Current Status/Progress Goal Weekly Team Focus  Medical   Left neglect slowly improving, left hemiparesis  Maximize  neuromuscular retraining, improve cognitive deficits following CVA  Work on left-sided attention   Bowel/Bladder   Pt cont. of bowel and bladder  manage b/b min assist  cont. plan of care   Swallow/Nutrition/ Hydration   Dys.3 thin with full supervision   least restrictive PO intake Mod I   trials of regular textures    ADL's             Mobility   min-mod A transfers, S-min A bed mobility, S R hemi  technique w/c mobility, mod A gait x 60 ft using hemiwalker and L AFO, mod A 5 stairs using 1 rail  supervision overall  L NMR, gait training, balance retraining, midline orientation, postural control, transfer training, attention to L, pt/fam edu   Communication   [facility]  supervision overall   selfcare re-training, neurmouscular re-education, RUE coordination and functional use, balance re-training, pt/family education   Safety/Cognition/ Behavioral Observations  Max assist   Supervision   increase intellectual and safety awareness as well as left attention    Pain   no complaints of pain         Skin   CDI            *See Care Plan and progress notes for long and short-term goals.  Barriers to Discharge: Still needs assistance, wife works    Possible Resolutions to Barriers:  Continue rehabilitation, social work will confer with family in regards to the availability of 24 7 caregiver    Discharge Planning/Teaching Needs:  HOme with wife and daughter to provide 24 hr care. Always have someone with him.  Neuro-psych to see tomorrow      Team Discussion:  Making progress-goals-supervision level-decreased awareness of deficits. Left inattention-memory issues, tone in arm. Neuro-psych to see today. Poor compensatory strategies.  Revisions to Treatment Plan:  None   Continued Need for Acute Rehabilitation Level of Care: The patient requires daily medical management by a physician with specialized training in physical medicine and rehabilitation for  the following conditions: Daily direction of a multidisciplinary physical rehabilitation program to ensure safe treatment while eliciting the highest outcome that is of practical value to the patient.: Yes Daily medical management of patient stability for increased activity during participation in an intensive rehabilitation regime.: Yes Daily analysis of laboratory values and/or radiology reports with any subsequent need for medication  adjustment of medical intervention for : Neurological problems;Other  Slate Debroux, Gardiner Rhyme 07/16/2014, 1:39 PM

## 2014-07-15 NOTE — Progress Notes (Signed)
NUTRITION FOLLOW UP  INTERVENTION: Chief Operating Officer.  Provide nourishment snack ([place] sandwich). Ordered.  Recommend obtaining new weight to fully assess weight trends.  Encourage adequate PO intake.  NUTRITION DIAGNOSIS: Inadequate oral intake related to dislike of food as evidenced by meal completion of 25-65%; ongoing  Goal: Pt to meet >/= 90% of their estimated nutrition needs; met  Monitor:  PO intake, weight trends, labs, I/O's  54 y.o. male  Admitting Dx: Stroke  ASSESSMENT: Pt with history of tobacco abuse, TIA 2 years ago, CVA late December. Presented 07/06/2014 with progressive left-sided weakness and slurred speech. CT scan reviewed from outside hospital in December showed acute right basal ganglia infarct.  Meal completion has varied from 25-100%. Pt reports his appetite is good. Pt has not been drinking his [employer] and he does not like it. He also reports not liking Magic cup. Will discontinue supplements. Pt has been enjoying his nourishment snacks. Pt was encouraged to eat his food at meals.  Nutrition Focused Physical Exam:  Subcutaneous Fat:  Orbital Region: N/A Upper Arm Region: WNL Thoracic and Lumbar Region: WNL  Muscle:  Temple Region: N/A Clavicle Bone Region: Moderate depletion Clavicle and Acromion Bone Region: Moderate depletion Scapular Bone Region: N/A Dorsal Hand: N/A Patellar Region: Moderate depletion Anterior Thigh Region: WNL Posterior Calf Region: WNL  Edema: none  Labs and medications reviewed.  Height: Ht Readings from Last 1 Encounters:  07/08/14 6' 2"  (1.88 m)    Weight: Wt Readings from Last 1 Encounters:  07/07/14 178 lb 9.2 oz (81 kg)    BMI:  Body Mass Index: 22.95 kg/(m^2)  Re-Estimated Nutritional Needs: Kcal: 2000-2200 Protein: 90-100 grams Fluid: 2 - 2.2 L/day  Skin: intact  Diet Order: DIET DYS 3   Intake/Output Summary (Last 24 hours) at 07/15/14 1320 Last data filed at  07/15/14 0802  Gross per 24 hour  Intake    600 ml  Output      0 ml  Net    600 ml    Last BM: 1/13  Labs:   Recent Labs Lab 07/08/14 1725 07/09/14 0550  NA  --  137  K  --  4.1  CL  --  106  CO2  --  22  BUN  --  9  CREATININE 1.11 1.09  CALCIUM  --  9.3  GLUCOSE  --  109*    CBG (last 3)  No results for input(s): GLUCAP in the last 72 hours.  Scheduled Meds: . aspirin EC  325 mg Oral Daily  . atorvastatin  20 mg Oral q1800  . feeding supplement (RESOURCE BREEZE)  1 Container Oral TID BM  . heparin  5,000 Units Subcutaneous 3 times per day  . nicotine  21 mg Transdermal Daily  . senna-docusate  2 tablet Oral BID  . tiZANidine  2 mg Oral TID    Continuous Infusions:   Past Medical History  Diagnosis Date  . Stroke     History reviewed. No pertinent past surgical history.  Kallie Locks, MS, RD, LDN Pager # [phone] After hours/ weekend pager # [phone]

## 2014-07-16 ENCOUNTER — Inpatient Hospital Stay (HOSPITAL_COMMUNITY): Payer: BLUE CROSS/BLUE SHIELD | Admitting: Occupational Therapy

## 2014-07-16 ENCOUNTER — Encounter (HOSPITAL_COMMUNITY): Payer: BLUE CROSS/BLUE SHIELD | Admitting: Occupational Therapy

## 2014-07-16 ENCOUNTER — Inpatient Hospital Stay (HOSPITAL_COMMUNITY): Payer: BLUE CROSS/BLUE SHIELD | Admitting: Physical Therapy

## 2014-07-16 ENCOUNTER — Inpatient Hospital Stay (HOSPITAL_COMMUNITY): Payer: BLUE CROSS/BLUE SHIELD | Admitting: Speech Pathology

## 2014-07-16 DIAGNOSIS — R414 Neurologic neglect syndrome: Secondary | ICD-10-CM

## 2014-07-16 NOTE — Progress Notes (Signed)
Physical Therapy Weekly Progress Note  Patient Details  Name: ZYERE JIMINEZ MRN: [id number] Date of Birth: 1960-12-15  Beginning of progress report period: July 09, 2014 End of progress report period: July 16, 2014  Today's Date: 07/16/2014 PT Individual Time: 1005-1105 PT Individual Time Calculation (min): 60 min   Patient has met 2 of 5 short term goals.  Patient is making slow but steady progress this reporting period in gait with improved mechanics and transition to quad cane from hemiwalker, squat pivot transfers, sit <> stand, and standing balance. Pt currently requires min-mod A for gait, supervision for wheelchair mobility, supervision-min A for transfers, and min A for stair negotiation. Patient continues to have very flat affect during therapies and can be difficult to engage in education as he subjectively denies any cognitive impairments. Wife is supportive but has not been available to observe during therapy.   Patient continues to demonstrate the following deficits: L hemiplegia, weakness, decreased endurance, impaired timing and sequencing, abnormal tone, unbalanced muscle activation, decreased midline orientation, decreased attention to left, decreased awareness, and cognitive deficits and therefore will continue to benefit from skilled PT intervention to enhance overall performance with activity tolerance, balance, postural control, ability to compensate for deficits, functional use of  left upper extremity and left lower extremity, attention and awareness.  Patient progressing toward long term goals.  Continue plan of care.  PT Short Term Goals Week 1:  PT Short Term Goal 1 (Week 1): Pt will perform bed <> w/c transfers with consistent min A. PT Short Term Goal 1 - Progress (Week 1): Progressing toward goal PT Short Term Goal 2 (Week 1): Pt will propel w/c 150 ft with mod I.  PT Short Term Goal 2 - Progress (Week 1): Progressing toward goal PT Short Term Goal 3 (Week  1): Pt will perform dynamic standing x 5 min with min A.  PT Short Term Goal 3 - Progress (Week 1): Progressing toward goal PT Short Term Goal 4 (Week 1): Pt will ambulate 150 ft using LRAD with mod A.  PT Short Term Goal 4 - Progress (Week 1): Met PT Short Term Goal 5 (Week 1): Pt will negotiate up/down 6 stairs using L rail ascending with mod A.  PT Short Term Goal 5 - Progress (Week 1): Met Week 2:  PT Short Term Goal 1 (Week 2): Pt will perform bed <> w/c transfers with consistent min A. PT Short Term Goal 2 (Week 2): Pt will transfer sit <> stand with supervision. PT Short Term Goal 3 (Week 2): Pt will ambulate 150 ft using LRAD with min A.  PT Short Term Goal 4 (Week 2): Pt will negotiate up/down 6 stairs using L rail ascending with min A.  PT Short Term Goal 5 (Week 2): Pt will propel w/c 150 ft with mod I.   Skilled Therapeutic Interventions/Progress Updates:   Pt received supine in bed, agreeable to therapy. Pt transferred supine > long sit > sit EOB with supervision and squat pivot transfer to wheelchair with min A. Pt requires cues for L leg rest management and initiating all mobility. Pt propelled wheelchair using R hemi technique x 150 ft with supervision. In therapy gym, gait training with use of LBQC progressed from hemiwalker with min-mod A overall x 75 ft with verbal/tactile cues for R and L knee extension during stance phase, upright trunk, forward gaze, and step length. Pt practiced squat pivot transfer training w/c <> mat table with supervision, verbal cues for technique  and foot placement to promote LLE WB. To challenge dynamic standing balance, patient participated in game of horseshoes including reaching outside BOS to grab horseshoe and ambulating without assistive device to retrieve horseshoes from floor with mod A overall. Mirror placed in front of patient for visual feedback for upright posture and L knee extension/stabilization in stance. To facilitate weight shifting to R  for improved L foot clearance during gait and L knee extension, patient placed/removed horseshoes on tall basketball rim up and to the R of patient with min A. Pt requires seated rest breaks between all tasks due to fatigue. To move from standing to wheelchair for rest, pt performed sidestepping and backwards stepping using LBQC with min-mod A and total cues for sequencing. Pt returned to room and left sitting in wheelchair with quick release belt on and all needs within reach.   Therapy Documentation Precautions:  Precautions Precautions: Fall Precaution Comments: left hemiparesis, left inattention Restrictions Weight Bearing Restrictions: No Pain: Pain Assessment Pain Assessment: No/denies pain Locomotion : Ambulation Ambulation/Gait Assistance: 4: Min assist;3: Mod assist Wheelchair Mobility Distance: 150   See FIM for current functional status  Therapy/Group: Individual Therapy  Laretta Alstrom 07/16/2014, 12:11 PM

## 2014-07-16 NOTE — Progress Notes (Signed)
Subjective/Complaints: Appreciate neuropsychology evaluation. 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 We discussed that at this point does not look like he would be able to return to his prior job. We also discussed that we'll have better idea of long-term prognosis at around the three-month mark Review of Systems - Negative except tired Objective: Vital Signs: Blood pressure 118/74, pulse 70, temperature 97.7 F (36.5 C), temperature source Oral, resp. rate 20, height _0  (1.88 m), weight 75 kg (165 lb 5.5 oz), SpO2 100 %. No results found. No results found for this or any previous visit (from the past 72 hour(s)).   HEENT: normal Cardio: RRR and no murmur Resp: CTA B/L and unlabored GI: BS positive and NT,ND Extremity:  Pulses positive and No Edema  Neuro: Lethargic, Flat, Cranial Nerve II-XII normal, Abnormal Sensory reduced LT left foot, Abnormal Motor 3- LDelt, 3- Left bi, tri, grip,4- L KE,3-HF, 2- ADF Abnormal FMC Ataxic/ dec FMC, Dysarthric and Inattention Musc/Skel:  Normal Gen NAD Mood and affect flat but otherwise appropriate  Assessment/Plan: 1. Functional deficits secondary to Right MCA infarct with Left HP which require 3+ hours per day of interdisciplinary therapy in a comprehensive inpatient rehab setting. Physiatrist is providing close team supervision and 24 hour management of active medical problems listed below. Physiatrist and rehab team continue to assess barriers to discharge/monitor patient progress toward functional and medical goals.  FIM: FIM - Bathing Bathing Steps Patient Completed: Chest, Left Arm, Right Arm, Abdomen, Front perineal area, Right upper leg, Left upper leg, Right lower leg (including foot), Left lower leg (including foot) Bathing: 4: Min-Patient completes 8-9 56f10 parts or 75+ percent  FIM - Upper Body Dressing/Undressing Upper body dressing/undressing steps  patient completed: Put head through opening of pull over shirt/dress, Pull shirt over trunk, Thread/unthread right sleeve of pullover shirt/dresss Upper body dressing/undressing: 4: Min-Patient completed 75 plus % of tasks FIM - Lower Body Dressing/Undressing Lower body dressing/undressing steps patient completed: Thread/unthread right pants leg, Don/Doff right sock, Thread/unthread right underwear leg, Thread/unthread left underwear leg, Thread/unthread left pants leg Lower body dressing/undressing: 4: Min-Patient completed 75 plus % of tasks  FIM - Toileting Toileting steps completed by patient: Performs perineal hygiene Toileting: 2: Max-Patient completed 1 of 3 steps  FIM - TRadio producerDevices: Grab bars Toilet Transfers: 3-To toilet/BSC: Mod A (lift or lower assist), 3-From toilet/BSC: Mod A (lift or lower assist)  FIM - BControl and instrumentation engineerDevices: Arm rests Bed/Chair Transfer: 5: Sit > Supine: Supervision (verbal cues/safety issues), 4: Chair or W/C > Bed: Min A (steadying Pt. > 75%)  FIM - Locomotion: Wheelchair Distance: 150 Locomotion: Wheelchair: 5: Travels 150 ft or more: maneuvers on rugs and over door sills with supervision, cueing or coaxing FIM - Locomotion: Ambulation Locomotion: Ambulation Assistive Devices: Orthosis, WEnvironmental consultant- Hemi Ambulation/Gait Assistance: 4: Min assist Locomotion: Ambulation: 1: Travels less than 50 ft with minimal assistance (Pt.>75%)  Comprehension Comprehension Mode: Auditory Comprehension: 4-Understands basic 75 - 89% of the time/requires cueing 10 - 24% of the time  Expression Expression Mode: Verbal Expression: 4-Expresses basic 75 - 89% of the time/requires cueing 10 - 24% of the time. Needs helper to occlude trach/needs to repeat words.  Social Interaction Social Interaction: 3-Interacts appropriately 50 - 74% of the time - May be physically or verbally  inappropriate.  Problem Solving Problem Solving: 3-Solves basic 50 - 74% of the  time/requires cueing 25 - 49% of the time  Memory Memory: 3-Recognizes or recalls 50 - 74% of the time/requires cueing 25 - 49% of the time   Medical Problem List and Plan: 1. 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 DVT Prophylaxis/Anticoagulation: Subcutaneous heparin. Monitor platelet counts have any signs of bleeding 3. Pain Management: Tylenol as needed 4. Tobacco abuse. Nicoderm patch. Provide counseling 5. Neuropsych: This patient is capable of making decisions on his own behalf. 6. Skin/Wound Care: Routine skin checks, Callus, chronic pressure area plantar surface left foot prox to met head 7. Fluids/Electrolytes/Nutrition: Strict I and O in follow of chemistries 8. Hyperlipidemia. Lipitor 9.  Sinus bradycardia asymptomatic no meds which contribute on list LOS (Days) 8 A FACE TO FACE EVALUATION WAS PERFORMED  Chrishonda Hesch E 07/16/2014, 9:09 AM

## 2014-07-16 NOTE — Consult Note (Signed)
NEUROCOGNITIVE STATUS EXAMINATION - [place]   [name] is a 54 year old man, who was seen for a brief neurocognitive status examination to evaluate his emotional state and mental status post-stroke.  Notably, he had a CVA in the right basal ganglia in December, 2015.  According to his medical record, he was admitted on 07/06/14 with progressive left-sided weakness and slurred speech.  MRI of the brain revealed acute multifocal infarcts within the right middle cerebral artery territory including right basal ganglia, right anterior occipital and right middle cerebral artery territory/watershed.  MRA demonstrated right ICA occlusion.  He did not receive TPA.    Emotional Functioning:  During the clinical interview, Samuel Shaw reported that he is doing "all right."  He denied having worries or concerns.  He also denied symptoms of depression, including suicidal ideation.  He did admit to occasional frustration when he is asked repetitive questions or to demonstrate skills repetitively.  However, when asked how he copes with frustration, the only answer he provided was that he keeps it inside and shuts down.  The importance of expressing his frustrations was discussed and ultimately, he confided that he is able to talk to his Shaw about stressors.  Samuel Shaw did not endorse any symptoms of depression on a self-report questionnaire.  Of note, following the session, his Shaw expressed concern that he is worried about finances, given that he is unable to immediately return to work.  She also stated that she worries that he is stuffing his emotions.  Signs of more serious depression were discussed with his Shaw so that she can inform his care team if he displays them.    Mental Status:  [name]' total score on an overall measure of mental status was suggestive of significant cognitive disruption, at the level of dementia (MoCA = 20/30).  He lost most points for  executive dysfunction (e.g. trouble with planning, organization, set-shifting, and abstract thinking), though he also failed to freely recall 2 of 5 previously studied words after a brief delay.  Subjectively he denied noticing cognitive changes.    Impressions and Recommendations:  [name]' total score on a brief measure of mental status was suggestive of marked cognitive disruption, at the level of dementia.  Most of his cognitive difficulty was secondary to executive dysfunction, which is common following stroke.  The timeline for cognitive recovery in cases of stroke was discussed with Samuel Shaw and he was encouraged to seek a comprehensive neuropsychological assessment as an outpatient prior to returning to work, as his current cognitive deficits would likely make it difficult for him to perform his job duties safely.  Although it is possible for his cognitive abilities to improve, he should be considered completely disabled from work at this time.  From an emotional standpoint, he may be having some depressed or anxious mood, given his Shaw's report, though Samuel Shaw was unwilling to engage in an open discussion regarding mood with the neuropsychologist.  He and his care team should watch out for signs of worsening mood in order to intervene as early as possible if his mood disruption progresses.    DIAGNOSIS:   CVA  Marlane Hatcher, Psy.D.  Clinical Neuropsychologist

## 2014-07-16 NOTE — Progress Notes (Signed)
Occupational Therapy Session Note  Patient Details  Name: Samuel Shaw MRN: [id number] Date of Birth: Feb 23, 1961  Today's Date: 07/16/2014 OT Individual Time: 1300-1330 OT Individual Time Calculation (min): 30 min    Skilled Therapeutic Interventions/Progress Updates:    Pt worked on LUE functional use and coordination during session.  Began with pt in standing while performing table slides with washcloth under the left hand.  He needed min facilitation to achieve left elbow extension with limited shoulder protraction compensation.  Mod facilitation needed to maintain left knee extension in standing as well.  He progressed to sitting edge of wheelchair and working on sliding hand forward to reach for playing cards.  Emphasis on achieving elbow extension with digit extension while maintaining palm on the table.  He also worked on picking card up with tip to tip pinch and flipping it over.  He was able to complete this with supervision for 50 % of attempts and min assist for the other 50%.    Therapy Documentation Precautions:  Precautions Precautions: Fall Precaution Comments: left hemiparesis, left inattention Restrictions Weight Bearing Restrictions: No  Pain: Pain Assessment Pain Assessment: No/denies pain  See FIM for current functional status  Therapy/Group: Individual Therapy  Jaceon Heiberger OTR/L 07/16/2014, 1:51 PM

## 2014-07-16 NOTE — Progress Notes (Signed)
Speech Language Pathology Daily Session Note  Patient Details  Name: Samuel Shaw MRN: [id number] Date of Birth: [birth year]  Today's Date: 07/16/2014 SLP Individual Time: 1340-1410 SLP Individual Time Calculation (min): 30 min  Short Term Goals: Week 1: SLP Short Term Goal 1 (Week 1): Patient will attend to left field of enviornment during functional tasks with Min A multimodal cues.  SLP Short Term Goal 2 (Week 1): Patient will demonstrate functional problem solving for basic and familiar tasks with supervision multimodal cues.  SLP Short Term Goal 3 (Week 1): Patient will sustain attention to a functional task for 15 minutes with Min A multimodal cues for redirection.  SLP Short Term Goal 4 (Week 1): Patient will identify 1 cognitive deficit with Min A multimodal cues.  SLP Short Term Goal 5 (Week 1): Patient will utilize call bell to express wants/needs with supervision multimodal cues.  SLP Short Term Goal 6 (Week 1): Patient will consume current diet with minimal overt s/s of aspiration with supervision multimodal cues for use of swallow strategies .  Skilled Therapeutic Interventions: Skilled treatment session focused on addressing dysphagia and cognitive-linguistic goals.  SLP facilitated session with set-up and Supervision level verbal cues to utilize safe swallow strategies while consuming trials of regular textures and thin liquids via straw.  Patient consumed advanced textures with efficient mastication and no overt s/s of aspiration.  Recommend upgraded trial tray during tomorrow's session prior to diet advancement given that trials were limited.  SLP also facilitated end of session with chair to bed transfer with Mod multimodal cues to recall sequence of transfer and safely problem solve through task with timely initiation and left attention.  Continue with current plan of care.   FIM:  Comprehension Comprehension Mode: Auditory Comprehension: 4-Understands basic 75 - 89% of  the time/requires cueing 10 - 24% of the time Expression Expression Mode: Verbal Expression: 5-Expresses basic 90% of the time/requires cueing < 10% of the time. Social Interaction Social Interaction: 4-Interacts appropriately 75 - 89% of the time - Needs redirection for appropriate language or to initiate interaction. Problem Solving Problem Solving: 3-Solves basic 50 - 74% of the time/requires cueing 25 - 49% of the time Memory Memory: 3-Recognizes or recalls 50 - 74% of the time/requires cueing 25 - 49% of the time FIM - Eating Eating Activity: 5: Supervision/cues;5: Set-up assist for open containers  Pain Pain Assessment Pain Assessment: No/denies pain  Therapy/Group: Individual Therapy  Carmelia Roller., [place] [phone]  [place] 07/16/2014, 8:44 PM

## 2014-07-16 NOTE — Progress Notes (Signed)
Occupational Therapy Session Note  Patient Details  Name: Samuel Shaw MRN: [id number] Date of Birth: Aug 18, 1960  Today's Date: 07/16/2014 OT Individual Time: 0800-0900 OT Individual Time Calculation (min): 60 min    Short Term Goals: Week 2:  OT Short Term Goal 1 (Week 2): Pt will use the LUE as an acitve assist for bathing with min assist. OT Short Term Goal 2 (Week 2): Pt will perform toilet transfers with min guard assist to elevated toilet. OT Short Term Goal 3 (Week 2): Pt will perform UB dressing with supervision with min questioning cueing OT Short Term Goal 4 (Week 2): Pt will perform LB bathing sit to stand with supervision. OT Short Term Goal 5 (Week 2): Pt will perform LB dressing sit to stand with min assist.    Skilled Therapeutic Interventions/Progress Updates:    Pt transferred to the wheelchair with min assist stand pivot from the bed.  Mod assist to transition from the wheelchair to the mat in quadriped position.  Worked on weightbearing in quadriped with emphasis on pt maintaining left elbow extension while reaching and unweighting the RLE.  Pt needs min to mod assist to achieve and maintain extension when attempting to reach with the RUE.  Transitioned to supine with use of dowel rod and ball for bilateral shoulder flexion and elbow extension.  Pt needing min assist and max demonstrational cueing to maintain grip on dowel rod with the LUE while performing shoulder flexion.  With use of ball pt exhibits some slight internal rotation of the humerus when attempting shoulder flexion resulting in him not being able to keep his palm down on the ball.  Finished session by having pt perform two 4 minute intervals of UE ergonometer with just the LUE.  Used ace bandage to wrap left hand secondary to decreased grip.  Pt needing min facilitation to achieve full elbow extension without trunk and shoulder compensation.  Therapy Documentation Precautions:  Precautions Precautions:  Fall Precaution Comments: left hemiparesis, left inattention Restrictions Weight Bearing Restrictions: No  Pain: Pain Assessment Pain Assessment: No/denies pain ADL: See FIM for current functional status  Therapy/Group: Individual Therapy  Dmarion Perfect OTR/L 07/16/2014, 11:32 AM

## 2014-07-17 ENCOUNTER — Encounter (HOSPITAL_COMMUNITY): Payer: BLUE CROSS/BLUE SHIELD | Admitting: Occupational Therapy

## 2014-07-17 ENCOUNTER — Inpatient Hospital Stay (HOSPITAL_COMMUNITY): Payer: BLUE CROSS/BLUE SHIELD | Admitting: Physical Therapy

## 2014-07-17 ENCOUNTER — Ambulatory Visit (HOSPITAL_COMMUNITY): Payer: BLUE CROSS/BLUE SHIELD | Admitting: Speech Pathology

## 2014-07-17 NOTE — Progress Notes (Signed)
Subjective/Complaints: No problems overnite, tolerating therapy Review of Systems - Negative except tired Objective: Vital Signs: Blood pressure 115/68, pulse 43, temperature 98.2 F (36.8 C), temperature source Oral, resp. rate 18, height _0  (1.88 m), weight 75 kg (165 lb 5.5 oz), SpO2 100 %. No results found. No results found for this or any previous visit (from the past 72 hour(s)).   HEENT: normal Cardio: RRR and no murmur Resp: CTA B/L and unlabored GI: BS positive and NT,ND Extremity:  Pulses positive and No Edema  Neuro: Lethargic, Flat, Cranial Nerve II-XII normal, Abnormal Sensory reduced LT left foot, Abnormal Motor 3- LDelt, 3- Left bi, tri, grip,4- L KE,3-HF, 2- ADF Abnormal FMC Ataxic/ dec FMC, Dysarthric and Inattention Musc/Skel:  Normal Gen NAD Mood and affect flat but otherwise appropriate  Assessment/Plan: 1. Functional deficits secondary to Right MCA infarct with Left HP which require 3+ hours per day of interdisciplinary therapy in a comprehensive inpatient rehab setting. Physiatrist is providing close team supervision and 24 hour management of active medical problems listed below. Physiatrist and rehab team continue to assess barriers to discharge/monitor patient progress toward functional and medical goals.  FIM: FIM - Bathing Bathing Steps Patient Completed: Chest, Left Arm, Right Arm, Abdomen, Front perineal area, Right upper leg, Left upper leg, Right lower leg (including foot), Left lower leg (including foot) Bathing: 4: Min-Patient completes 8-9 [id number] parts or 75+ percent  FIM - Upper Body Dressing/Undressing Upper body dressing/undressing steps patient completed: Put head through opening of pull over shirt/dress, Pull shirt over trunk, Thread/unthread right sleeve of pullover shirt/dresss Upper body dressing/undressing: 4: Min-Patient completed 75 plus % of tasks FIM - Lower Body Dressing/Undressing Lower body dressing/undressing steps patient  completed: Thread/unthread right pants leg, Don/Doff right sock, Thread/unthread right underwear leg, Thread/unthread left underwear leg, Thread/unthread left pants leg Lower body dressing/undressing: 4: Min-Patient completed 75 plus % of tasks  FIM - Toileting Toileting steps completed by patient: Performs perineal hygiene Toileting: 2: Max-Patient completed 1 of 3 steps  FIM - TRadio producerDevices: Grab bars Toilet Transfers: 3-To toilet/BSC: Mod A (lift or lower assist), 3-From toilet/BSC: Mod A (lift or lower assist)  FIM - BControl and instrumentation engineerDevices: Arm rests Bed/Chair Transfer: 4: Bed > Chair or W/C: Min A (steadying Pt. > 75%), 5: Supine > Sit: Supervision (verbal cues/safety issues)  FIM - Locomotion: Wheelchair Distance: 150 Locomotion: Wheelchair: 5: Travels 150 ft or more: maneuvers on rugs and over door sills with supervision, cueing or coaxing FIM - Locomotion: Ambulation Locomotion: Ambulation Assistive Devices: Orthosis, Cane - Quad (L AFO) Ambulation/Gait Assistance: 4: Min assist, 3: Mod assist Locomotion: Ambulation: 2: Travels 50 - 149 ft with moderate assistance (Pt: 50 - 74%)  Comprehension Comprehension Mode: Auditory Comprehension: 4-Understands basic 75 - 89% of the time/requires cueing 10 - 24% of the time  Expression Expression Mode: Verbal Expression: 5-Expresses basic 90% of the time/requires cueing < 10% of the time.  Social Interaction Social Interaction: 4-Interacts appropriately 75 - 89% of the time - Needs redirection for appropriate language or to initiate interaction.  Problem Solving Problem Solving: 3-Solves basic 50 - 74% of the time/requires cueing 25 - 49% of the time  Memory Memory: 3-Recognizes or recalls 50 - 74% of the time/requires cueing 25 - 49% of the time   Medical Problem List and Plan: 1. Functional deficits secondary to right MCA infarct(anterior occipital and  basal ganglia) with left hemiparesis and visual-spatial deficits 2.  DVT Prophylaxis/Anticoagulation: Subcutaneous heparin. Monitor platelet counts have any signs of bleeding 3. Pain Management: Tylenol as needed 4. Tobacco abuse. Nicoderm patch. Provide counseling 5. Neuropsych: This patient is capable of making decisions on his own behalf. 6. Skin/Wound Care: Routine skin checks, Callus, chronic pressure area plantar surface left foot prox to met head 7. Fluids/Electrolytes/Nutrition: Strict I and O in follow of chemistries 8. Hyperlipidemia. Lipitor 9.  Sinus bradycardia asymptomatic no meds which contribute on list LOS (Days) 9 A FACE TO FACE EVALUATION WAS PERFORMED  Rafael Quesada E 07/17/2014, 8:24 AM

## 2014-07-17 NOTE — Progress Notes (Signed)
Occupational Therapy Session Note  Patient Details  Name: Samuel Shaw MRN: [id number] Date of Birth: 1960/11/18  Today's Date: 07/17/2014 OT Individual Time: 8288-3374 OT Individual Time Calculation (min): 60 min    Short Term Goals: Week 1:  OT Short Term Goal 1 (Week 1): Pt will complete all bathing with min assist sit to stand. OT Short Term Goal 1 - Progress (Week 1): Met OT Short Term Goal 2 (Week 1): Pt will maintain sustained attention and complete bathing with no more than mod questioning cues. OT Short Term Goal 2 - Progress (Week 1): Met OT Short Term Goal 3 (Week 1): Pt will perform walk-in shower transfers with min assist stand pivot. OT Short Term Goal 3 - Progress (Week 1): Met OT Short Term Goal 4 (Week 1): Pt will use the LUE as an acitve assist for bathing with min assist. OT Short Term Goal 4 - Progress (Week 1): Not met OT Short Term Goal 5 (Week 1): Pt will perform toilet transfers to elevated toilet or 3:1 with min assist.  OT Short Term Goal 5 - Progress (Week 1): Met Week 2:  OT Short Term Goal 1 (Week 2): Pt will use the LUE as an acitve assist for bathing with min assist. OT Short Term Goal 2 (Week 2): Pt will perform toilet transfers with min guard assist to elevated toilet. OT Short Term Goal 3 (Week 2): Pt will perform UB dressing with supervision with min questioning cueing OT Short Term Goal 4 (Week 2): Pt will perform LB bathing sit to stand with supervision. OT Short Term Goal 5 (Week 2): Pt will perform LB dressing sit to stand with min assist.    Skilled Therapeutic Interventions/Progress Updates:    Pt performed bathing and dressing shower level.  Min assist for transfer to the shower seat stand pivot.  He was able to sequence through the bathing task this session with min questioning cues.  He also initiated the task with min subtle cueing as well.  Min assist for integration of the LUE during bathing for washing the right arm and left leg.  He  still demonstrates some slight increased flexor tone in the left elbow and knee.  He was able to perform dressing sit to stand with min assist to donn brief and pants but needs max assist for donning socks, shoes, and AFO.  Instructed pt to perform RUE exercises over the weekend with concentration on elbow extension, shoulder flexion, and digit opposition.   Therapy Documentation Precautions:  Precautions Precautions: Fall Precaution Comments: left hemiparesis, left inattention Restrictions Weight Bearing Restrictions: No  Pain: Pain Assessment Pain Assessment: No/denies pain ADL: See FIM for current functional status  Therapy/Group: Individual Therapy  Dejana Pugsley OTR/L 07/17/2014, 11:42 AM

## 2014-07-17 NOTE — Progress Notes (Signed)
Speech Language Pathology Weekly Progress Note  Patient Details  Name: Samuel Shaw MRN: [id number] Date of Birth: January 25, 1961  Beginning of progress report period: July 09, 2014 End of progress report period: July 17, 2014  Short Term Goals: Week 1: SLP Short Term Goal 1 (Week 1): Patient will attend to left field of enviornment during functional tasks with Min A multimodal cues.  SLP Short Term Goal 1 - Progress (Week 1): Met SLP Short Term Goal 2 (Week 1): Patient will demonstrate functional problem solving for basic and familiar tasks with supervision multimodal cues.  SLP Short Term Goal 2 - Progress (Week 1): Progressing toward goal SLP Short Term Goal 3 (Week 1): Patient will sustain attention to a functional task for 15 minutes with Min A multimodal cues for redirection.  SLP Short Term Goal 3 - Progress (Week 1): Met SLP Short Term Goal 4 (Week 1): Patient will identify 1 cognitive deficit with Min A multimodal cues.  SLP Short Term Goal 4 - Progress (Week 1): Progressing toward goal SLP Short Term Goal 5 (Week 1): Patient will utilize call bell to express wants/needs with supervision multimodal cues.  SLP Short Term Goal 5 - Progress (Week 1): Progressing toward goal SLP Short Term Goal 6 (Week 1): Patient will consume current diet with minimal overt s/s of aspiration with supervision multimodal cues for use of swallow strategies . SLP Short Term Goal 6 - Progress (Week 1): Met    New Short Term Goals: Week 2: SLP Short Term Goal 1 (Week 2): Patient will consume regular textures and thin liquids with Mod I use of swallow strategies.  SLP Short Term Goal 2 (Week 2): Patient will select attention to a functional task for 15 minutes with Min A multimodal cues for redirection.  SLP Short Term Goal 3 (Week 2): Patient will identify 1 cognitive and 2 physical deficits with Min A multimodal cues.  SLP Short Term Goal 4 (Week 2): Patient will request help as needed with Mod A  multimodal cues.  SLP Short Term Goal 5 (Week 2): Patient will attend to left upper extremity during functional tasks with Min A multimodal cues.  SLP Short Term Goal 6 (Week 2): Patient will demonstrate functional problem solving for basic and familiar tasks with Min A multimodal cues.   Weekly Progress Updates: Patient has made functional gains and has met 3 out of 6 short term goals this reporting period due to improved swallow function, left attention, recall and ability to solve basic problems.  Currently, patient continues to require Mod assist for al basic self-care tasks due to poor awareness of deficits which impacts overall safety.  Patient and family education is ongoing. Patient would benefit from continued skilled SLP intervention to maximize overall functional independence in order to reduce burden of care prior to discharge home with 24/7 assist.   Intensity: Minumum of 1-2 x/day, 30 to 90 minutes Frequency: 5 out of 7 days Duration/Length of Stay: 1/27 Treatment/Interventions: Cognitive remediation/compensation;Cueing hierarchy;Dysphagia/aspiration precaution training;Environmental controls;Functional tasks;Internal/external aids;Patient/family education;Therapeutic Activities  Carmelia Roller., CCC-SLP [phone]   [place] 07/17/2014, 9:40 PM

## 2014-07-17 NOTE — Progress Notes (Signed)
Speech Language Pathology Daily Session Note  Patient Details  Name: Samuel Shaw MRN: [id number] Date of Birth: Jan 31, 1961  Today's Date: 07/17/2014 SLP Individual Time: 1310-1410 SLP Individual Time Calculation (min): 60 min  Short Term Goals: Week 1: SLP Short Term Goal 1 (Week 1): Patient will attend to left field of enviornment during functional tasks with Min A multimodal cues.  SLP Short Term Goal 2 (Week 1): Patient will demonstrate functional problem solving for basic and familiar tasks with supervision multimodal cues.  SLP Short Term Goal 3 (Week 1): Patient will sustain attention to a functional task for 15 minutes with Min A multimodal cues for redirection.  SLP Short Term Goal 4 (Week 1): Patient will identify 1 cognitive deficit with Min A multimodal cues.  SLP Short Term Goal 5 (Week 1): Patient will utilize call bell to express wants/needs with supervision multimodal cues.  SLP Short Term Goal 6 (Week 1): Patient will consume current diet with minimal overt s/s of aspiration with supervision multimodal cues for use of swallow strategies .  Skilled Therapeutic Interventions:  Pt was seen for skilled ST targeting goals for dysphagia.  Upon arrival, pt was reclined in bed, awake, alert, and agreeable to participate in [place].  Pt was transferred to wheelchair to maximize swallowing safety, attention, and alertness during structured therapeutic tasks. SLP facilitated the session with a trial meal tray of regular textures and thin liquids.  Pt consumed the abovementioned meal with overall supervision cues to monitor left pocketing to clear solid residuals from the oral cavity post swallow.  Recommend a diet upgrade to regular solids with continued thin liquids.  Pt updated regarding current progress in therapy and plan of care and SLP reviewed and reinforced rationale behind recommended swallowing precautions to facilitate carryover between therapy sessions.  Continue per current plan of  care.    FIM:  Comprehension Comprehension Mode: Auditory Comprehension: 5-Understands basic 90% of the time/requires cueing < 10% of the time Expression Expression Mode: Verbal Expression: 5-Expresses basic 90% of the time/requires cueing < 10% of the time. Social Interaction Social Interaction: 4-Interacts appropriately 75 - 89% of the time - Needs redirection for appropriate language or to initiate interaction. Problem Solving Problem Solving: 4-Solves basic 75 - 89% of the time/requires cueing 10 - 24% of the time Memory Memory: 3-Recognizes or recalls 50 - 74% of the time/requires cueing 25 - 49% of the time FIM - Eating Eating Activity: 5: Supervision/cues  Pain Pain Assessment Pain Assessment: No/denies pain  Therapy/Group: Individual Therapy  Fumio Vandam, Selinda Orion 07/17/2014, 4:51 PM

## 2014-07-17 NOTE — Progress Notes (Signed)
Physical Therapy Session Note  Patient Details  Name: Samuel Shaw MRN: [id number] Date of Birth: 1961/06/06  Today's Date: 07/17/2014 PT Individual Time: 0830-0930 PT Individual Time Calculation (min): 60 min   Short Term Goals: Week 2:  PT Short Term Goal 1 (Week 2): Pt will perform bed <> w/c transfers with consistent min A. PT Short Term Goal 2 (Week 2): Pt will transfer sit <> stand with supervision. PT Short Term Goal 3 (Week 2): Pt will ambulate 150 ft using LRAD with min A.  PT Short Term Goal 4 (Week 2): Pt will negotiate up/down 6 stairs using L rail ascending with min A.  PT Short Term Goal 5 (Week 2): Pt will propel w/c 150 ft with mod I.   Skilled Therapeutic Interventions/Progress Updates:   Pt received semi reclined in bed, agreeable to therapy. Pt transferred to edge of bed and performed squat pivot transfer to L bed > wheelchair with supervision, verbal cues for removing arm rest and foot placement. For LLE NMR, pt propelled wheelchair using BLE with min A to provide downward approximation at L quad, total cues for sequencing. With mirror for visual feedback and promoting forward gaze, gait training using WBQC x 90 ft and min A with initial manual facilitation for weight shift to R for improved L foot clearance progressed to verbal cues and verbal/tactile cues for R knee extension in stance to decrease crouched gait and upright trunk and cues for progressing WBQC. With focus on LLE extension in stance, pt performed step taps to fatigue x 2 using R foot only to 4" step using [facility] with mirror in front for visual feedback. Pt with improved between session carryover this session for L foot placement and technique with sit <> stand transfers and good within session carryover with feedback during ambulation. Pt negotiated up/down 15 stairs using R rail with min A overall, verbal cues for sequencing and therapist blocking patient from posteriorly rotating L hip. Following stair  negotiation patient reporting, "I can feel it when I shift my weight," demonstrating awareness and carryover from gait training applicable to stairs. Pt performed NuStep using BLE for L NMR timing and sequencing, level 4 x 5 min. Pt propelled back to room using R hemi technique with supervision and left sitting in wheelchair with quick release belt on and all needs within reach.   Therapy Documentation Precautions:  Precautions Precautions: Fall Precaution Comments: left hemiparesis, left inattention Restrictions Weight Bearing Restrictions: No Pain: Pain Assessment Pain Assessment: No/denies pain  See FIM for current functional status  Therapy/Group: Individual Therapy  Laretta Alstrom 07/17/2014, 9:30 AM

## 2014-07-18 NOTE — Progress Notes (Signed)
Patient ID: Samuel Shaw, male   DOB: Jun 03, 1961, 54 y.o.   MRN: [id number]  07/18/14.  54 y/o admit for CIR with functional deficits secondary to right MCA infarct(anterior occipital and basal ganglia) with left hemiparesis and visual-spatial deficits  Subjective/Complaints: No problems overnite, tolerating therapy. No c/os. Review of Systems - Negative except tired    Past Medical History  Diagnosis Date  . Stroke    Patient Vitals for the past 24 hrs:  BP Temp Temp src Pulse Resp SpO2  07/18/14 0559 114/75 mmHg 98.3 F (36.8 C) Oral (!) 49 17 100 %  07/17/14 1421 122/72 mmHg 98.4 F (36.9 C) Oral (!) 49 16 95 %     Intake/Output Summary (Last 24 hours) at 07/18/14 1032 Last data filed at 07/18/14 0900  Gross per 24 hour  Intake   1080 ml  Output    300 ml  Net    780 ml   Objective: Vital Signs: Blood pressure 114/75, pulse 49, temperature 98.3 F (36.8 C), temperature source Oral, resp. rate 17, height 6\' 2"  (1.88 m), weight 75 kg (165 lb 5.5 oz), SpO2 100 %. No results found. No results found for this or any previous visit (from the past 72 hour(s)).   HEENT: normal Cardio: RRR and no murmur  Remains slow Resp: CTA B/L and unlabored GI: BS positive and NT,ND Extremity:  Pulses positive and No Edema  Neuro: Lethargic, Flat, Cranial Nerve II-XII normal, Abnormal Sensory reduced LT left foot, Abnormal Motor 3- LDelt, 3- Left bi, tri, grip,4- L KE,3-HF, 2- ADF Abnormal FMC Ataxic/ dec FMC, Dysarthric and Inattention Musc/Skel:  Normal Gen NAD thin Mood and affect flat but otherwise appropriate    Medical Problem List and Plan: 1. 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 DVT Prophylaxis/Anticoagulation: Subcutaneous heparin. Monitor platelet counts have any signs of bleeding 3. Pain Management: Tylenol as needed 4. Tobacco abuse. Nicoderm patch. Provide  counseling  5. Hyperlipidemia. Lipitor 6.  Sinus bradycardia asymptomatic no meds which contribute on list  LOS (Days) 10 A FACE TO FACE EVALUATION WAS PERFORMED  Nyoka Cowden 07/18/2014, 10:31 AM

## 2014-07-19 ENCOUNTER — Inpatient Hospital Stay (HOSPITAL_COMMUNITY): Payer: BLUE CROSS/BLUE SHIELD | Admitting: *Deleted

## 2014-07-19 ENCOUNTER — Inpatient Hospital Stay (HOSPITAL_COMMUNITY): Payer: BLUE CROSS/BLUE SHIELD

## 2014-07-19 NOTE — Progress Notes (Signed)
Patient ID: Samuel Shaw, male   DOB: 1960-09-25, 54 y.o.   MRN: [id number]  Patient ID: Samuel Shaw, male   DOB: 1961/06/08, 53 y.o.   MRN: [id number]  07/19/14.  54 y/o admit for CIR with functional deficits secondary to right MCA infarct(anterior occipital and basal ganglia) with left hemiparesis and visual-spatial deficits  Subjective/Complaints: No problems overnite, tolerating therapy. No c/os. Review of Systems - Negative except tired    Past Medical History  Diagnosis Date  . Stroke    Patient Vitals for the past 24 hrs:  BP Temp Temp src Pulse Resp SpO2  07/19/14 0739 123/72 mmHg 98.3 F (36.8 C) Oral (!) 44 18 97 %  07/18/14 1445 126/75 mmHg 98.1 F (36.7 C) Oral (!) 43 19 100 %     Intake/Output Summary (Last 24 hours) at 07/19/14 0928 Last data filed at 07/18/14 1808  Gross per 24 hour  Intake    720 ml  Output    200 ml  Net    520 ml   Objective: Vital Signs: Blood pressure 123/72, pulse 44, temperature 98.3 F (36.8 C), temperature source Oral, resp. rate 18, height 6\' 2"  (1.88 m), weight 75 kg (165 lb 5.5 oz), SpO2 97 %. No results found. No results found for this or any previous visit (from the past 72 hour(s)).   HEENT: normal Cardio: RRR and no murmur  Remains slow Resp: CTA B/L and unlabored GI: BS positive and NT,ND Extremity:  Pulses positive and No Edema  Neuro: Lethargic, Flat, Cranial Nerve II-XII normal, Abnormal Sensory reduced LT left foot, Abnormal Motor 3- LDelt, 3- Left bi, tri, grip,4- L KE,3-HF, 2- ADF Abnormal FMC Ataxic/ dec FMC, Dysarthric and Inattention Musc/Skel:  Normal Gen NAD thin Mood and affect flat but otherwise appropriate    Medical Problem List and Plan: 1. 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 DVT Prophylaxis/Anticoagulation: Subcutaneous heparin. Monitor platelet counts have any signs of bleeding 3. Pain  Management: Tylenol as needed 4. Tobacco abuse. Nicoderm patch. Provide counseling  5. Hyperlipidemia. Lipitor 6.  Sinus bradycardia asymptomatic no meds which contribute on list  LOS (Days) 11 A FACE TO FACE EVALUATION WAS PERFORMED  Nyoka Cowden 07/19/2014, 9:28 AM

## 2014-07-19 NOTE — Progress Notes (Signed)
Patient moved from 4W14 to 4W09 due to possible bed bug infestation. Patient significant other givens bags and packed up all belongings to take home. Patient cleaned, put in new hospital gown and transferred. Patient and significant other given handout with information pertaining to the insect. The charge nurse contacted environmental services and the house supervisor. Exterminator to be contacted as well. Continue with plan of care.

## 2014-07-19 NOTE — Progress Notes (Signed)
Physical Therapy Session Note  Patient Details  Name: Samuel Shaw MRN: [id number] Date of Birth: Jul 19, 1960  Today's Date: 07/19/2014 PT Individual Time: 0938-1829 PT Individual Time Calculation (min): 45 min   Short Term Goals: Week 2:  PT Short Term Goal 1 (Week 2): Pt will perform bed <> w/c transfers with consistent min A. PT Short Term Goal 2 (Week 2): Pt will transfer sit <> stand with supervision. PT Short Term Goal 3 (Week 2): Pt will ambulate 150 ft using LRAD with min A.  PT Short Term Goal 4 (Week 2): Pt will negotiate up/down 6 stairs using L rail ascending with min A.  PT Short Term Goal 5 (Week 2): Pt will propel w/c 150 ft with mod I.   Skilled Therapeutic Interventions/Progress Updates:   Pt asleep upon arrival. Therapist donned Tedhose with total A. Pt performed supine to sit with S to sit EOB to don shoes. Pt able to don R shoe without A but required A with L shoe/brace. Min A transfer scoot/squat pivot to w/c with cues for hand placement and technique. Pt needing to use bathroom and per request, pt's wife performed A with transfers (has been checked off on Safety plan) as needed. W/c propulsion using hemi-technique down to therapy gym with S. Pt able to mange breaks and w/c parts except L legrest to set up for transfer - cues to get chair positioned closer to mat prior to transferring. Performed with close S and then later required steady A for transfer back to w/c. Stair negotiation using R rail to simulate home entry with min A and manual facilitation at L knee for extension and to provide support for balance during transition. Pt able to independently recall which foot to ascend and descend with. Gait training with focus on neuro re-ed for balance, step length, hip and knee extension on LLE and postural control using WBQC x 65' with min A (2 episode of LOB required mod A to recover) and cues for gait pattern with AD. Returned back to room with total A from therapist due to  time and safety belt applied. Pt's wife present with patient.   Therapy Documentation Precautions:  Precautions Precautions: Fall Precaution Comments: left hemiparesis, left inattention Restrictions Weight Bearing Restrictions: No  Pain:  Denies pain.  See FIM for current functional status  Therapy/Group: Individual Therapy  Canary Brim [facility] 07/19/2014, 11:45 AM

## 2014-07-20 ENCOUNTER — Inpatient Hospital Stay (HOSPITAL_COMMUNITY): Payer: BLUE CROSS/BLUE SHIELD | Admitting: Speech Pathology

## 2014-07-20 ENCOUNTER — Inpatient Hospital Stay (HOSPITAL_COMMUNITY): Payer: BLUE CROSS/BLUE SHIELD | Admitting: Rehabilitation

## 2014-07-20 ENCOUNTER — Encounter (HOSPITAL_COMMUNITY): Payer: BLUE CROSS/BLUE SHIELD | Admitting: Occupational Therapy

## 2014-07-20 ENCOUNTER — Inpatient Hospital Stay (HOSPITAL_COMMUNITY): Payer: BLUE CROSS/BLUE SHIELD | Admitting: *Deleted

## 2014-07-20 ENCOUNTER — Encounter (HOSPITAL_COMMUNITY): Payer: BLUE CROSS/BLUE SHIELD

## 2014-07-20 NOTE — Progress Notes (Signed)
Speech Language Pathology Daily Session Note  Patient Details  Name: Samuel Shaw MRN: [id number] Date of Birth: [birth year]  Today's Date: 07/20/2014 SLP Individual Time: 6761-9509 SLP Individual Time Calculation (min): 10 min  Short Term Goals: Week 2: SLP Short Term Goal 1 (Week 2): Patient will consume regular textures and thin liquids with Mod I use of swallow strategies.  SLP Short Term Goal 2 (Week 2): Patient will select attention to a functional task for 15 minutes with Min A multimodal cues for redirection.  SLP Short Term Goal 3 (Week 2): Patient will identify 1 cognitive and 2 physical deficits with Min A multimodal cues.  SLP Short Term Goal 4 (Week 2): Patient will request help as needed with Mod A multimodal cues.  SLP Short Term Goal 5 (Week 2): Patient will attend to left upper extremity during functional tasks with Min A multimodal cues.  SLP Short Term Goal 6 (Week 2): Patient will demonstrate functional problem solving for basic and familiar tasks with Min A multimodal cues.   Skilled Therapeutic Interventions: Skilled treatment focused on swallowing goals. SLP facilitated session with skille dobservation of PO trials consisting of mixed consistency boluses and thin liquids via cup sips. Pt without overt signs of aspiration wtih supervision level cueing. Pt requried Min-Mod cues for anticipatory awareness during functional conversation regarding assistance levels needed. Continue plan of care.   FIM:  Comprehension Comprehension Mode: Auditory Comprehension: 5-Follows basic conversation/direction: With no assist Expression Expression Mode: Verbal Expression: 5-Expresses basic 90% of the time/requires cueing < 10% of the time. Social Interaction Social Interaction: 4-Interacts appropriately 75 - 89% of the time - Needs redirection for appropriate language or to initiate interaction. Problem Solving Problem Solving: 5-Solves basic 90% of the time/requires cueing <  10% of the time Memory Memory: 4-Recognizes or recalls 75 - 89% of the time/requires cueing 10 - 24% of the time FIM - Eating Eating Activity: 5: Supervision/cues;5: Set-up assist for open containers  Pain Pain Assessment Pain Assessment: No/denies pain Pain Score: 0-No pain  Therapy/Group: Individual Therapy   Germain Osgood, M.A. CCC-SLP [phone]  Germain Osgood 07/20/2014, 10:22 AM

## 2014-07-20 NOTE — Progress Notes (Signed)
Physical Therapy Session Note  Patient Details  Name: Samuel Shaw MRN: [id number] Date of Birth: [birth year]  Today's Date: 07/20/2014 PT Individual Time: 0815-0900 PT Individual Time Calculation (min): 45 min   Short Term Goals: Week 2:  PT Short Term Goal 1 (Week 2): Pt will perform bed <> w/c transfers with consistent min A. PT Short Term Goal 2 (Week 2): Pt will transfer sit <> stand with supervision. PT Short Term Goal 3 (Week 2): Pt will ambulate 150 ft using LRAD with min A.  PT Short Term Goal 4 (Week 2): Pt will negotiate up/down 6 stairs using L rail ascending with min A.  PT Short Term Goal 5 (Week 2): Pt will propel w/c 150 ft with mod I.   Skilled Therapeutic Interventions/Progress Updates:    Patient received semi-reclined in bed. Patient missed first 15 minutes of session secondary to eating breakfast and receiving medications. Session focused on functional transfers at squat pivot level and wheelchair mobility >150' with minA overall. BERG Balance Test administered, see below for results. Discussion with patient in regards to changing rooms over weekend due to "bed bugs". Wife took all clothing and shoes home (likely including OUR AFO). Notified patient that in order to get his own AFO, we need original.  Patient returned to room and left sitting in wheelchair with seatbelt on and all needs within reach.  Therapy Documentation Precautions:  Precautions Precautions: Fall Precaution Comments: left hemiparesis, left inattention Restrictions Weight Bearing Restrictions: No General: PT Amount of Missed Time (min): 15 Minutes PT Missed Treatment Reason: Nursing care;Other (Comment) (eating breakfast/meds) Pain: Pain Assessment Pain Assessment: No/denies pain Pain Score: 0-No pain Locomotion : Ambulation Ambulation/Gait Assistance: Not tested (comment) Wheelchair Mobility Distance: 150  Balance:  Balance Balance Assessed: Yes Standardized Balance  Assessment Standardized Balance Assessment: Berg Balance Test Berg Balance Test Sit to Stand: Able to stand  independently using hands Standing Unsupported: Able to stand 2 minutes with supervision Sitting with Back Unsupported but Feet Supported on Floor or Stool: Able to sit safely and securely 2 minutes Stand to Sit: Controls descent by using hands Transfers: Able to transfer with verbal cueing and /or supervision Standing Unsupported with Eyes Closed: Unable to keep eyes closed 3 seconds but stays steady Standing Ubsupported with Feet Together: Needs help to attain position but able to stand for 30 seconds with feet together From Standing, Reach Forward with Outstretched Arm: Can reach forward >12 cm safely (5") From Standing Position, Pick up Object from Floor: Unable to try/needs assist to keep balance From Standing Position, Turn to Look Behind Over each Shoulder: Turn sideways only but maintains balance Turn 360 Degrees: Needs assistance while turning Standing Unsupported, Alternately Place Feet on Step/Stool: Needs assistance to keep from falling or unable to try Standing Unsupported, One Foot in Front: Loses balance while stepping or standing Standing on One Leg: Unable to try or needs assist to prevent fall Total Score: 22/56, indicating patient is at high risk (~100%) for falls  See FIM for current functional status  Therapy/Group: Individual Therapy  Lillia Abed. Laconda Basich, PT, DPT 07/20/2014, 10:59 AM

## 2014-07-20 NOTE — Progress Notes (Signed)
Speech Language Pathology Daily Session Note  Patient Details  Name: Samuel Shaw MRN: [id number] Date of Birth: 11-18-1960  Today's Date: 07/20/2014 SLP Individual Time: 1435-1510 SLP Individual Time Calculation (min): 35 min  Short Term Goals: Week 2: SLP Short Term Goal 1 (Week 2): Patient will consume regular textures and thin liquids with Mod I use of swallow strategies.  SLP Short Term Goal 2 (Week 2): Patient will select attention to a functional task for 15 minutes with Min A multimodal cues for redirection.  SLP Short Term Goal 3 (Week 2): Patient will identify 1 cognitive and 2 physical deficits with Min A multimodal cues.  SLP Short Term Goal 4 (Week 2): Patient will request help as needed with Mod A multimodal cues.  SLP Short Term Goal 5 (Week 2): Patient will attend to left upper extremity during functional tasks with Min A multimodal cues.  SLP Short Term Goal 6 (Week 2): Patient will demonstrate functional problem solving for basic and familiar tasks with Min A multimodal cues.   Skilled Therapeutic Interventions:  Pt was seen for skilled ST targeting cognitive goals.  Upon arrival, pt was seated upright in bed awake, alert, and agreeable to participate in therapy with min encouragement.  Pt recalled a basic, familiar card game and used teach back to educate SLP on game rules.  Pt required mod-max assist question cues for functional problem solving during the abovementioned task; however, he selectively attended to activity in a moderately distracting environment for the duration of today's therapy session with no cuing needed for redirection.  Furthermore, more pt was note with improved attention to his left upper extremity and attempted to use it functionally during the card game.  Continue per current plan of care.    FIM:  Comprehension Comprehension Mode: Auditory Comprehension: 5-Follows basic conversation/direction: With extra time/assistive  device Expression Expression Mode: Verbal Expression: 5-Expresses basic 90% of the time/requires cueing < 10% of the time. Social Interaction Social Interaction: 5-Interacts appropriately 90% of the time - Needs monitoring or encouragement for participation or interaction. Problem Solving Problem Solving: 4-Solves basic 75 - 89% of the time/requires cueing 10 - 24% of the time Memory Memory: 4-Recognizes or recalls 75 - 89% of the time/requires cueing 10 - 24% of the time  Pain Pain Assessment Pain Assessment: No/denies pain  Therapy/Group: Individual Therapy    Aram Domzalski, Selinda Orion 07/20/2014, 4:13 PM

## 2014-07-20 NOTE — Progress Notes (Signed)
Physical Therapy Session Note  Patient Details  Name: Samuel Shaw MRN: [id number] Date of Birth: 01/04/1961  Today's Date: 07/20/2014 PT Individual Time: 1345-1430 PT Individual Time Calculation (min): 45 min   Short Term Goals: Week 2:  PT Short Term Goal 1 (Week 2): Pt will perform bed <> w/c transfers with consistent min A. PT Short Term Goal 2 (Week 2): Pt will transfer sit <> stand with supervision. PT Short Term Goal 3 (Week 2): Pt will ambulate 150 ft using LRAD with min A.  PT Short Term Goal 4 (Week 2): Pt will negotiate up/down 6 stairs using L rail ascending with min A.  PT Short Term Goal 5 (Week 2): Pt will propel w/c 150 ft with mod I.   Skilled Therapeutic Interventions/Progress Updates:   Pt received lying in bed, agreeable to therapy session.  Pt able to sit at EOB at S level, however utilized bed rails and HOB elevated since bed mobility not focus of session.  Pt transferred to w/c from bed at min/guard to min A level with cues for safety and foot placement, as well as safety of LUE, as he almost sat on top of hand and then when cued stated "I'm good."  Skilled session focused on LUE/LE NMR through WB and weight shifting in tall kneeling and quadruped position.  Performed quadruped reaching activities with RUE to increase forward weight shift over LUE with assist for maintaining L elbow extension.  Cues for keeping LEs still as he tended to advance RLE.  The progressed to SL propped on L elbow while reaching and placing clothes pins to WB through LUE and also work on trunk shortening/lengthening.  Ended session with two bouts of gait x 70' x 2, once with hemi walker for pt comfort and again without AD in order to challenge balance and increase WB through LLE.  Donned ace wrap for DF assist as brace was still being cleaned per pt report.  With use of hemi walker, requires mod A with facilitation for upright posture and weight shift/WB through LLE with assist for placement of LLE  as he tends to adduct LE.   Then ambulated without device at more max A to maintain upright posture and LE alignment with stepping.  Assisted back to w/c and back to room.  Left in w/c with quick release belt donned and all needs in reach.   Therapy Documentation Precautions:  Precautions Precautions: Fall Precaution Comments: left hemiparesis, left inattention Restrictions Weight Bearing Restrictions: No   Vital Signs: Therapy Vitals Temp: 98 F (36.7 C) Temp Source: Oral Pulse Rate: (!) 50 Resp: 18 BP: 136/84 mmHg Patient Position (if appropriate): Sitting Oxygen Therapy SpO2: 100 % O2 Device: Not Delivered Pain: Pain Assessment Pain Assessment: No/denies pain   Locomotion : Ambulation Ambulation/Gait Assistance: 3: Mod assist;2: Max assist   See FIM for current functional status  Therapy/Group: Individual Therapy  Denice Bors 07/20/2014, 3:51 PM

## 2014-07-20 NOTE — Progress Notes (Signed)
Subjective/Complaints: Patient was switched to a new room. He has no complaints. Continues to have left-sided neglect. Review of Systems - Negative except tired Objective: Vital Signs: Blood pressure 133/79, pulse 50, temperature 97.6 F (36.4 C), temperature source Oral, resp. rate 18, height 6' 2" (1.88 m), weight 75 kg (165 lb 5.5 oz), SpO2 100 %. No results found. No results found for this or any previous visit (from the past 72 hour(s)).   HEENT: normal Cardio: RRR and no murmur Resp: CTA B/L and unlabored GI: BS positive and NT,ND Extremity:  Pulses positive and No Edema  Neuro: Lethargic, Flat, Cranial Nerve II-XII normal, Abnormal Sensory reduced LT left foot, Abnormal Motor 3- LDelt, 3- Left bi, tri, grip,4- L KE,3-HF, 2- ADF Abnormal FMC Ataxic/ dec FMC, Dysarthric and Inattention Musc/Skel:  Normal Gen NAD Mood and affect flat but otherwise appropriate  Assessment/Plan: 1. Functional deficits secondary to Right MCA infarct with Left HP which require 3+ hours per day of interdisciplinary therapy in a comprehensive inpatient rehab setting. Physiatrist is providing close team supervision and 24 hour management of active medical problems listed below. Physiatrist and rehab team continue to assess barriers to discharge/monitor patient progress toward functional and medical goals.  Discussed AFO with therapy. Using a loaner, question status of permanent AFO, we will make sure order written FIM: FIM - Bathing Bathing Steps Patient Completed: Chest, Left Arm, Right Arm, Abdomen, Front perineal area, Right upper leg, Left upper leg, Right lower leg (including foot), Left lower leg (including foot) Bathing: 4: Min-Patient completes 8-9 65f10 parts or 75+ percent  FIM - Upper Body Dressing/Undressing Upper body dressing/undressing steps patient completed: Put head through opening of pull over shirt/dress, Pull shirt over trunk, Thread/unthread right sleeve of pullover shirt/dresss,  Thread/unthread left sleeve of pullover shirt/dress Upper body dressing/undressing: 5: Supervision: Safety issues/verbal cues FIM - Lower Body Dressing/Undressing Lower body dressing/undressing steps patient completed: Thread/unthread right pants leg, Don/Doff right sock, Thread/unthread right underwear leg, Thread/unthread left underwear leg, Thread/unthread left pants leg, Pull underwear up/down, Pull pants up/down Lower body dressing/undressing: 3: Mod-Patient completed 50-74% of tasks  FIM - Toileting Toileting steps completed by patient: Performs perineal hygiene Toileting: 2: Max-Patient completed 1 of 3 steps  FIM - TRadio producerDevices: Grab bars Toilet Transfers: 4-To toilet/BSC: Min A (steadying Pt. > 75%), 4-From toilet/BSC: Min A (steadying Pt. > 75%)  FIM - Bed/Chair Transfer Bed/Chair Transfer Assistive Devices: Arm rests, HOB elevated, Bed rails Bed/Chair Transfer: 4: Supine > Sit: Min A (steadying Pt. > 75%/lift 1 leg), 4: Bed > Chair or W/C: Min A (steadying Pt. > 75%), 4: Chair or W/C > Bed: Min A (steadying Pt. > 75%)  FIM - Locomotion: Wheelchair Distance: 150 Locomotion: Wheelchair: 4: Travels 150 ft or more: maneuvers on rugs and over door sillls with minimal assistance (Pt.>75%) FIM - Locomotion: Ambulation Locomotion: Ambulation Assistive Devices: Orthosis, CNurse, adultAmbulation/Gait Assistance: Not tested (comment) Locomotion: Ambulation: 0: Activity did not occur  Comprehension Comprehension Mode: Auditory Comprehension: 5-Follows basic conversation/direction: With no assist  Expression Expression Mode: Verbal Expression: 5-Expresses basic 90% of the time/requires cueing < 10% of the time.  Social Interaction Social Interaction: 4-Interacts appropriately 75 - 89% of the time - Needs redirection for appropriate language or to initiate interaction.  Problem Solving Problem Solving: 5-Solves basic 90% of the time/requires  cueing < 10% of the time  Memory Memory: 4-Recognizes or recalls 75 - 89% of the time/requires cueing 10 -  24% of the time   Medical Problem List and Plan: 1. 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 DVT Prophylaxis/Anticoagulation: Subcutaneous heparin. Monitor platelet counts have any signs of bleeding 3. Pain Management: Tylenol as needed 4. Tobacco abuse. Nicoderm patch. Provide counseling 5. Neuropsych: This patient is capable of making decisions on his own behalf. 6. Skin/Wound Care: Routine skin checks, Callus, chronic pressure area plantar surface left foot prox to met head 7. Fluids/Electrolytes/Nutrition: Strict I and O in follow of chemistries 8. Hyperlipidemia. Lipitor 9.  Sinus bradycardia asymptomatic no meds which contribute on list LOS (Days) 12 A FACE TO FACE EVALUATION WAS PERFORMED  KIRSTEINS,ANDREW E 07/20/2014, 12:53 PM

## 2014-07-20 NOTE — Progress Notes (Signed)
Occupational Therapy Session Note  Patient Details  Name: Samuel Shaw MRN: [id number] Date of Birth: 14-Sep-1960  Today's Date: 07/20/2014 OT Individual Time: 1015-1100 OT Individual Time Calculation (min): 45 min    Short Term Goals: Week 2:  OT Short Term Goal 1 (Week 2): Pt will use the LUE as an acitve assist for bathing with min assist. OT Short Term Goal 2 (Week 2): Pt will perform toilet transfers with min guard assist to elevated toilet. OT Short Term Goal 3 (Week 2): Pt will perform UB dressing with supervision with min questioning cueing OT Short Term Goal 4 (Week 2): Pt will perform LB bathing sit to stand with supervision. OT Short Term Goal 5 (Week 2): Pt will perform LB dressing sit to stand with min assist.    Skilled Therapeutic Interventions/Progress Updates:    Pt performed shower transfer with min assist to the tub bench during session.  Min assist for all bathing with min subtle cueing for initiation of task.  Min facilitation needed to integrate the LUE into the bathing task.  Min assist for all sit to stand transitions with mod instructional cueing to position the LLE and to push up from the surface.  He still demonstrates increased flexor tone in the LUE and LLE during standing and functional use as well.  He was able to transfer to the wheelchair in front of the sink with min assist as well.  Pt did not have clothing this session as his wife had to take them home to wash them.  He was able to donn his gripper socks using the one handed technique with supervision.   Therapy Documentation Precautions:  Precautions Precautions: Fall Precaution Comments: left hemiparesis, left inattention Restrictions Weight Bearing Restrictions: No   Pain: Pain Assessment Pain Assessment: No/denies pain ADL: See FIM for current functional status  Therapy/Group: Individual Therapy  Reagan Klemz OTR/L 07/20/2014, 3:17 PM

## 2014-07-21 ENCOUNTER — Encounter (HOSPITAL_COMMUNITY): Payer: BLUE CROSS/BLUE SHIELD | Admitting: Occupational Therapy

## 2014-07-21 ENCOUNTER — Inpatient Hospital Stay (HOSPITAL_COMMUNITY): Payer: BLUE CROSS/BLUE SHIELD | Admitting: Physical Therapy

## 2014-07-21 ENCOUNTER — Inpatient Hospital Stay (HOSPITAL_COMMUNITY): Payer: BLUE CROSS/BLUE SHIELD | Admitting: Speech Pathology

## 2014-07-21 NOTE — Progress Notes (Signed)
Occupational Therapy Session Note  Patient Details  Name: Samuel Shaw MRN: [id number] Date of Birth: September 10, 1960  Today's Date: 07/21/2014 OT Individual Time: 0900-1000 OT Individual Time Calculation (min): 60 min    Short Term Goals: Week 2:  OT Short Term Goal 1 (Week 2): Pt will use the LUE as an acitve assist for bathing with min assist. OT Short Term Goal 2 (Week 2): Pt will perform toilet transfers with min guard assist to elevated toilet. OT Short Term Goal 3 (Week 2): Pt will perform UB dressing with supervision with min questioning cueing OT Short Term Goal 4 (Week 2): Pt will perform LB bathing sit to stand with supervision. OT Short Term Goal 5 (Week 2): Pt will perform LB dressing sit to stand with min assist.    Skilled Therapeutic Interventions/Progress Updates:    Pt performed ambulation to the shower with mod assist hand held.  Once in the shower he needed increased time to initiate bathing and demonstrated slight distraction with conversation, however he did begin the process on his own without cueing.  He was able to complete all bathing with supervision except for standing to wash peri area, which required min assist.  He did wash his body twice and when questioned about it, he stated that he always washes twice.  Once finished transferred back out to the wheelchair at the sink with mod assist.  Upper body dressing with supervision and min assist for LB except for donning his shoe and AFO on the left foot.  For this he needed max assist.  He continues to use the LUE with min assist for all bathing tasks but needs mod assist for integration to pull up pants or underpants.    Therapy Documentation Precautions:  Precautions Precautions: Fall Precaution Comments: left hemiparesis, left inattention Restrictions Weight Bearing Restrictions: No  Pain: Pain Assessment Pain Assessment: No/denies pain ADL: See FIM for current functional status  Therapy/Group: Individual  Therapy  Emmanuell Kantz OTR/L 07/21/2014, 12:26 PM

## 2014-07-21 NOTE — Progress Notes (Signed)
NUTRITION FOLLOW UP  INTERVENTION: Provide nourishment snack ([place] sandwich). Ordered.  Encourage adequate PO intake.  NUTRITION DIAGNOSIS: Inadequate oral intake related to dislike of food as evidenced by meal completion of 25-65%; ongoing  Goal: Pt to meet >/= 90% of their estimated nutrition needs; met  Monitor:  PO intake, weight trends, labs, I/O's  54 y.o. male  Admitting Dx: Stroke  ASSESSMENT: Pt with history of tobacco abuse, TIA 2 years ago, CVA late December. Presented 07/06/2014 with progressive left-sided weakness and slurred speech. CT scan reviewed from outside hospital in December showed acute right basal ganglia infarct.  Meal completion has varied from 20-100%. Appetite has been good. Pt has been receiving his nourishment snacks. Will continue with current orders. Wife reported wanting information on a heart healthy diet post discharge. Wife was given "Heart Healthy Eating Nutrition Therapy" handout from the [employer]. Wife has been encouraging pt to eat his food at meals.  Labs and medications reviewed.  Height: Ht Readings from Last 1 Encounters:  07/08/14 _0  (1.88 m)    Weight: Wt Readings from Last 1 Encounters:  07/16/14 165 lb 5.5 oz (75 kg)    BMI:  Body Mass Index: 22.95 kg/(m^2)  Re-Estimated Nutritional Needs: Kcal: 2000-2200 Protein: 90-100 grams Fluid: 2 - 2.2 L/day  Skin: intact  Diet Order: Diet regular   Intake/Output Summary (Last 24 hours) at 07/21/14 1521 Last data filed at 07/21/14 1200  Gross per 24 hour  Intake    600 ml  Output    275 ml  Net    325 ml    Last BM: 1/16  Labs:  No results for input(s): NA, K, CL, CO2, BUN, CREATININE, CALCIUM, MG, PHOS, GLUCOSE in the last 168 hours.  CBG (last 3)  No results for input(s): GLUCAP in the last 72 hours.  Scheduled Meds: . aspirin EC  325 mg Oral Daily  . atorvastatin  20 mg Oral q1800  . heparin  5,000 Units Subcutaneous 3 times per  day  . nicotine  21 mg Transdermal Daily  . senna-docusate  2 tablet Oral BID  . tiZANidine  2 mg Oral TID    Continuous Infusions:   Past Medical History  Diagnosis Date  . Stroke     History reviewed. No pertinent past surgical history.  Kallie Locks, MS, RD, LDN Pager # [phone] After hours/ weekend pager # [phone]

## 2014-07-21 NOTE — Progress Notes (Signed)
Physical Therapy Session Note  Patient Details  Name: Samuel Shaw MRN: [id number] Date of Birth: 15-May-1961  Today's Date: 07/21/2014 PT Individual Time: 0830-0930 PT Individual Time Calculation (min): 60 min   Short Term Goals: Week 2:  PT Short Term Goal 1 (Week 2): Pt will perform bed <> w/c transfers with consistent min A. PT Short Term Goal 2 (Week 2): Pt will transfer sit <> stand with supervision. PT Short Term Goal 3 (Week 2): Pt will ambulate 150 ft using LRAD with min A.  PT Short Term Goal 4 (Week 2): Pt will negotiate up/down 6 stairs using L rail ascending with min A.  PT Short Term Goal 5 (Week 2): Pt will propel w/c 150 ft with mod I.   Skilled Therapeutic Interventions/Progress Updates:   Pt received sitting in bed, requesting to use bathroom. Pt transferred to edge of bed with supervision and donned shoes/L AFO with assist. Pt ambulated to bathroom and sink to wash BUE without AD and mod A with verbal/tactile cues for upright posture, L knee extension, and weight shifting. Pt performed clothing management, hygiene, and toilet transfer with use of grab bar and steady assist. Pt propelled w/c using R hemi technique with supervision, no cues for brake and leg rest management. Pt performed L NMR in quadruped with mod tactile cues for positioning hips over knees and LUE WB while reaching and placing clothespins on vertical rod with RUE to facilitate weight shift forward and to right. Worked on trunk/hip extension and equal WB through BLE in static tall kneeling with cues to touch L hip to therapist's hip for LLE WB and hip abductor strengthening. Pt walked BLE forward/backward on mat table in tall kneeling position with min-mod A to unweight each side to advance LE by weight shifting to opposite side. Gait training using WBQC x 140 ft and x 150 ft with overall min A, verbal/tactile cues for weight shift to R for improved LLE clearance, upright posture and forward gaze, and therapist  blocking L hip from posteriorly rotating when advancing LLE. Provided pt with wheelchair cushion for improved seating and positioning and pt left in wheelchair with quick release belt on and needs within reach. Patient with improved social interaction noted this session.   Therapy Documentation Precautions:  Precautions Precautions: Fall Precaution Comments: left hemiparesis, left inattention Restrictions Weight Bearing Restrictions: No Pain: Pain Assessment Pain Assessment: No/denies pain Locomotion : Ambulation Ambulation/Gait Assistance: 4: Min assist Wheelchair Mobility Distance: 150   See FIM for current functional status  Therapy/Group: Individual Therapy  Laretta Alstrom 07/21/2014, 11:01 AM

## 2014-07-21 NOTE — Progress Notes (Signed)
Subjective/Complaints: Pt feels like he is improving Review of Systems - Negative except tired Objective: Vital Signs: Blood pressure 114/76, pulse 55, temperature 97.9 F (36.6 C), temperature source Oral, resp. rate 18, height 6' 2"  (1.88 m), weight 75 kg (165 lb 5.5 oz), SpO2 99 %. No results found. No results found for this or any previous visit (from the past 72 hour(s)).   HEENT: normal Cardio: RRR and no murmur Resp: CTA B/L and unlabored GI: BS positive and NT,ND Extremity:  Pulses positive and No Edema  Neuro: Lethargic, Flat, Cranial Nerve II-XII normal, Abnormal Sensory reduced LT left foot, Abnormal Motor 3- LDelt, 3- Left bi, tri, grip,4- L KE,3-HF, 2- ADF Abnormal FMC Ataxic/ dec FMC, Dysarthric and Inattention Musc/Skel:  Normal Gen NAD Mood and affect flat but otherwise appropriate  Assessment/Plan: 1. Functional deficits secondary to Right MCA infarct with Left HP which require 3+ hours per day of interdisciplinary therapy in a comprehensive inpatient rehab setting. Physiatrist is providing close team supervision and 24 hour management of active medical problems listed below. Physiatrist and rehab team continue to assess barriers to discharge/monitor patient progress toward functional and medical goals.  Plan team conf in am FIM: FIM - Bathing Bathing Steps Patient Completed: Chest, Left Arm, Right Arm, Abdomen, Front perineal area, Right upper leg, Left upper leg, Right lower leg (including foot), Left lower leg (including foot) Bathing: 4: Min-Patient completes 8-9 [id number] parts or 75+ percent  FIM - Upper Body Dressing/Undressing Upper body dressing/undressing steps patient completed: Put head through opening of pull over shirt/dress, Pull shirt over trunk, Thread/unthread right sleeve of pullover shirt/dresss, Thread/unthread left sleeve of pullover shirt/dress Upper body dressing/undressing: 5: Supervision: Safety issues/verbal cues FIM - Lower Body  Dressing/Undressing Lower body dressing/undressing steps patient completed: Thread/unthread right pants leg, Don/Doff right sock, Thread/unthread right underwear leg, Thread/unthread left underwear leg, Thread/unthread left pants leg, Pull underwear up/down, Pull pants up/down Lower body dressing/undressing: 3: Mod-Patient completed 50-74% of tasks  FIM - Toileting Toileting steps completed by patient: Adjust clothing prior to toileting, Adjust clothing after toileting Toileting: 7: Independent: No helper, no device  FIM - TRadio producerDevices: Grab bars Toilet Transfers: 4-To toilet/BSC: Min A (steadying Pt. > 75%), 4-From toilet/BSC: Min A (steadying Pt. > 75%)  FIM - Bed/Chair Transfer Bed/Chair Transfer Assistive Devices: Arm rests, HOB elevated, Bed rails Bed/Chair Transfer: 5: Supine > Sit: Supervision (verbal cues/safety issues), 4: Bed > Chair or W/C: Min A (steadying Pt. > 75%)  FIM - Locomotion: Wheelchair Distance: 150 Locomotion: Wheelchair: 0: Activity did not occur FIM - Locomotion: Ambulation Locomotion: Ambulation Assistive Devices: Walker - Hemi, Other (comment) (ace wrap for DF assist and no AD) Ambulation/Gait Assistance: 3: Mod assist, 2: Max assist Locomotion: Ambulation: 2: Travels 50 - 149 ft with maximal assistance (Pt: 25 - 49%)  Comprehension Comprehension Mode: Auditory Comprehension: 5-Follows basic conversation/direction: With extra time/assistive device  Expression Expression Mode: Verbal Expression: 5-Expresses basic 90% of the time/requires cueing < 10% of the time.  Social Interaction Social Interaction: 5-Interacts appropriately 90% of the time - Needs monitoring or encouragement for participation or interaction.  Problem Solving Problem Solving: 4-Solves basic 75 - 89% of the time/requires cueing 10 - 24% of the time  Memory Memory: 4-Recognizes or recalls 75 - 89% of the time/requires cueing 10 - 24% of the  time   Medical Problem List and Plan: 1. Functional deficits secondary to right MCA infarct(anterior occipital and basal ganglia) with left  hemiparesis and visual-spatial deficits 2. DVT Prophylaxis/Anticoagulation: Subcutaneous heparin. Monitor platelet counts have any signs of bleeding 3. Pain Management: Tylenol as needed 4. Tobacco abuse. Nicoderm patch. Provide counseling 5. Neuropsych: This patient is capable of making decisions on his own behalf. 6. Skin/Wound Care: Routine skin checks, Callus, chronic pressure area plantar surface left foot prox to met head 7. Fluids/Electrolytes/Nutrition: Strict I and O in follow of chemistries 8. Hyperlipidemia. Lipitor 9.  Sinus bradycardia asymptomatic no meds which contribute on list LOS (Days) 13 A FACE TO FACE EVALUATION WAS PERFORMED  KIRSTEINS,ANDREW E 07/21/2014, 8:23 AM

## 2014-07-21 NOTE — Progress Notes (Signed)
Speech Language Pathology Daily Session Note  Patient Details  Name: Samuel Shaw MRN: [id number] Date of Birth: 1960-08-30  Today's Date: 07/21/2014 SLP Individual Time: 1300-1400 SLP Individual Time Calculation (min): 60 min  Short Term Goals: Week 2: SLP Short Term Goal 1 (Week 2): Patient will consume regular textures and thin liquids with Mod I use of swallow strategies.  SLP Short Term Goal 2 (Week 2): Patient will select attention to a functional task for 15 minutes with Min A multimodal cues for redirection.  SLP Short Term Goal 3 (Week 2): Patient will identify 1 cognitive and 2 physical deficits with Min A multimodal cues.  SLP Short Term Goal 4 (Week 2): Patient will request help as needed with Mod A multimodal cues.  SLP Short Term Goal 5 (Week 2): Patient will attend to left upper extremity during functional tasks with Min A multimodal cues.  SLP Short Term Goal 6 (Week 2): Patient will demonstrate functional problem solving for basic and familiar tasks with Min A multimodal cues.   Skilled Therapeutic Interventions: Skilled treatment focused on addressing cognition goals. SLP facilitated session with colored pictures of an image and requested that patient problem solve the 3D construction of it; patient required Max cues to identify errors and request help as needed.  Patient also required Mod verbal cues to recall and attend to left upper extremity throughout task.  Continue plan of care.   FIM:  Comprehension Comprehension Mode: Auditory Comprehension: 5-Follows basic conversation/direction: With no assist Expression Expression Mode: Verbal Expression: 5-Expresses basic needs/ideas: With extra time/assistive device Social Interaction Social Interaction: 5-Interacts appropriately 90% of the time - Needs monitoring or encouragement for participation or interaction. Problem Solving Problem Solving: 4-Solves basic 75 - 89% of the time/requires cueing 10 - 24% of the  time Memory Memory: 4-Recognizes or recalls 75 - 89% of the time/requires cueing 10 - 24% of the time  Pain Pain Assessment Pain Assessment: No/denies pain  Therapy/Group: Individual Therapy  Carmelia Roller., [place] [phone]  [place] 07/21/2014, 5:39 PM

## 2014-07-22 ENCOUNTER — Encounter (HOSPITAL_COMMUNITY): Payer: BLUE CROSS/BLUE SHIELD | Admitting: Occupational Therapy

## 2014-07-22 ENCOUNTER — Inpatient Hospital Stay (HOSPITAL_COMMUNITY): Payer: BLUE CROSS/BLUE SHIELD | Admitting: Physical Therapy

## 2014-07-22 ENCOUNTER — Inpatient Hospital Stay (HOSPITAL_COMMUNITY): Payer: BLUE CROSS/BLUE SHIELD | Admitting: Occupational Therapy

## 2014-07-22 ENCOUNTER — Inpatient Hospital Stay (HOSPITAL_COMMUNITY): Payer: BLUE CROSS/BLUE SHIELD | Admitting: Speech Pathology

## 2014-07-22 NOTE — Progress Notes (Signed)
Occupational Therapy Session Note  Patient Details  Name: Samuel Shaw MRN: [id number] Date of Birth: 07/06/1960  Today's Date: 07/22/2014 OT Individual Time: 1345-1415 OT Individual Time Calculation (min): 30 min   Skilled Therapeutic Interventions/Progress Updates:    Pt went down to the gym and worked in standing at the high/low table.  Focused on maintaining standing balance while engaging the LUE into functional activity.  Began with pt wiping the table with a washcloth with emphasis on achieving full elbow extension while reaching toward multiple directions.  Pt still with decreased ability to achieve or maintain left knee extension, but could stand statically with min guard assist.  Helped to facilitate weightshift over the LLE while standing as he demonstrates greater weightshift to the right.  Progressed to having him work on picking up and stacking cups with the LUE.  Pt with increased synergy pattern noted in the shoulder.  Positioned the cup with emphasis on elbow extension again.  Noted pt with increased flexor tone when reaching and attempting to release the cups.  At times he would knock them over when attempting to bring reposition his hand.  Finished session by having pt attempt to catch and toss a medium sized beach ball.  Pt with moderate difficulty with this task without severe synergy pattern and compensation in the LUE.   Therapy Documentation Precautions:  Precautions Precautions: Fall Precaution Comments: left hemiparesis, left inattention Restrictions Weight Bearing Restrictions: No  Pain: Pain Assessment Pain Assessment: No/denies pain   Therapy/Group: Individual Therapy  Bryanna Yim OTR/L 07/22/2014, 3:54 PM

## 2014-07-22 NOTE — Progress Notes (Signed)
Occupational Therapy Weekly Progress Note  Patient Details  Name: Samuel Shaw MRN: [id number] Date of Birth: 26-Jun-1961  Beginning of progress report period: July 16, 2014 End of progress report period: July 22, 2014  Today's Date: 07/22/2014 OT Individual Time: 1000-1102 OT Individual Time Calculation (min): 62 min    Patient has met 2 of 5 short term goals.  He is continuing to make steady progress with all bathing and dressing as well as for functional transfers.  He currently needs min assist for all functional transfers as well as min assist to maintain his balance in standing during peri bathing and when pulling brief and pants over his hips.  Samuel Shaw continues to use the LUE spontaneously with bathing tasks to wash the right arm as well as the left leg but needs mod assist to initiate and integrate use during dressing tasks.  He exhibits some slight decreased left neglect as well and when performing washing and rinsing he will continually spend more time on the right side.  Min questioning cues to utilize hemi dressing techniques.  Overall he still exhibits limited emergent awareness and needs cueing to recognize his errors, but this has improved over the past week.  Feel he is on target to meet supervision level goals with planned discharge next week.  Will continue with current OT POC with focus on neuromuscular re-education, selfcare re-training, and pt/family education.  Patient continues to demonstrate the following deficits: decreased balance, decreased coordination, decreased strength, decreased awareness, and therefore will continue to benefit from skilled OT intervention to enhance overall performance with BADL.  Patient progressing toward long term goals..  Continue plan of care.  OT Short Term Goals Week 3:  OT Short Term Goal 1 (Week 3): Pt will perform toilet transfers with min guard assist to elevated toilet. OT Short Term Goal 2 (Week 3): Pt will perform UB  dressing with supervision with min questioning cueing OT Short Term Goal 3 (Week 3): Pt will perform LB bathing sit to stand with supervision. OT Short Term Goal 4 (Week 3): Pt will perform shower transfers with supervision using assistive device. OT Short Term Goal 5 (Week 3): Pt will use the LUE during dressing with min assist and no more than min instructional cueing to initiate use.   Skilled Therapeutic Interventions/Progress Updates:    Pt performed bathing and dressing during session.  He was able to ambulate to the shower with mod assist hand held.  Still with increased scissoring in the LLE requiring mod instructional cueing to correct.  Pt also with lean left requiring mod assist to correct balance.  He was able to complete all bathing with min assist and initiated task without cueing.  He did need min instructional cueing to remember to wash his LEs however.  Increased time and mod questioning cueing to donn pullover shirt as pt had it backwards and then inside out before finally positioning it the right way and donning.  He initially donned his sweat pants on backwards but was able to figure it out once he saw his tag, without verbal cueing.  He continues to need max assist for donning the left shoe with AFO but can perform the right with setup and shoe buttons to adapt tying.    Therapy Documentation Precautions:  Precautions Precautions: Fall Precaution Comments: left hemiparesis, left inattention Restrictions Weight Bearing Restrictions: No  Pain: Pain Assessment Pain Assessment: No/denies pain ADL: See FIM for current functional status  Therapy/Group: Individual Therapy  [name]  OTR/L 07/22/2014, 12:08 PM

## 2014-07-22 NOTE — Progress Notes (Signed)
Physical Therapy Session Note  Patient Details  Name: Samuel Shaw MRN: [id number] Date of Birth: 1961/01/02  Today's Date: 07/22/2014 PT Individual Time: 1430-1520 PT Individual Time Calculation (min): 50 min   Short Term Goals: Week 2:  PT Short Term Goal 1 (Week 2): Pt will perform bed <> w/c transfers with consistent min A. PT Short Term Goal 2 (Week 2): Pt will transfer sit <> stand with supervision. PT Short Term Goal 3 (Week 2): Pt will ambulate 150 ft using LRAD with min A.  PT Short Term Goal 4 (Week 2): Pt will negotiate up/down 6 stairs using L rail ascending with min A.  PT Short Term Goal 5 (Week 2): Pt will propel w/c 150 ft with mod I.   Skilled Therapeutic Interventions/Progress Updates:   Pt received sitting in wheelchair, ready for therapy. Pt propelled wheelchair using R hemi technique 2 x 150 ft and managed wheelchair brakes and leg rest with mod I. Pt performed sit <> stand transfers with close supervision and stand pivot transfers with steady assist, no cues needed for technique. Pt negotiated up/down 15 stairs using R rail and step-to pattern with close supervision and no cues for sequencing. Pt negotiated up/down curb step x 3 using WBQC with S-steady assist and min cues for sequencing. Trialled gait with use of WBQC > NBQC > SPC in controlled and home environments, multiple bouts up to 150 ft with supervision with use of WBQC and steady assist with NBQC and SPC. Recommend use of SPC or no AD with therapy for ambulation at this time to increase dynamic balance challenge. Pt with improved L knee extension/stability in stance, L foot positioning, upright posture, and forward gaze noted this session. With fatigue at end of session, pt requires min assist during ambulation with SPC to prevent L hip from posteriorly rotating > rotating trunk to left. Pt performed bed mobility on regular bed in ADL apartment with supervision. Pt performed furniture transfers to low compliant  couch x 8, pt able to safely position B feet to facilitate more equal WB through BLE. Pt performed car transfer to sedan height using SPC with supervision, verbal cues to lift LLE in/out of car instead of using hands to move leg. Pt returned to room and left sitting in wheelchair with wife asleep in room.   Therapy Documentation Precautions:  Precautions Precautions: Fall Precaution Comments: left hemiparesis, left inattention Restrictions Weight Bearing Restrictions: No Pain: Pain Assessment Pain Assessment: No/denies pain Locomotion : Ambulation Ambulation/Gait Assistance: 4: Min guard;5: Supervision Wheelchair Mobility Distance: 150   See FIM for current functional status  Therapy/Group: Individual Therapy  Laretta Alstrom 07/22/2014, 3:27 PM

## 2014-07-22 NOTE — Progress Notes (Signed)
Social Work Patient ID: Samuel Shaw, male   DOB: 1961/03/12, 54 y.o.   MRN: [id number] Met with pt and wife to discuss team conference progression toward his goals and discharge still 1/27. Both are pleased with his progress but knew he could do it. Have given wife back SSD application with some medical records to give to their disability lawyer.  Agreeable to OP therapies when discharged.  Work toward discharge date.

## 2014-07-22 NOTE — Patient Care Conference (Signed)
Inpatient RehabilitationTeam Conference and Plan of Care Update Date: 07/22/2014   Time: 11;25 AM    Patient Name: Samuel Shaw      Medical Record Number: [id number]  Date of Birth: 06-16-1961 Sex: Male         Room/Bed: 4W09C/4W09C-01 Payor Info: Payor: [place] / Plan: [employer] OTHER / Product Type: *No Product type* /    Admitting Diagnosis: R MCA CVA  Admit Date/Time:  07/08/2014  4:24 PM Admission Comments: No comment available   Primary Diagnosis:  <principal problem not specified> Principal Problem: <principal problem not specified>  Patient Active Problem List   Diagnosis Date Noted  . Hemi-neglect of left side 07/16/2014  . Spastic hemiplegia affecting nondominant side 07/10/2014  . Cognitive deficit, post-stroke 07/10/2014  . Sinus bradycardia on ECG 07/10/2014  . 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 CVA (cerebral vascular accident)   . Tobacco abuse   . HLD (hyperlipidemia)   . CVA (cerebral infarction) 07/06/2014    Expected Discharge Date: Expected Discharge Date: 07/29/14  Team Members Present: Physician leading conference: Dr. Alysia Penna Social Worker Present: Ovidio Kin, LCSW Nurse Present: Heather Roberts, RN PT Present: Carney Living, Jimmie Molly, PT OT Present: Clyda Greener, Rhetta Mura, OT SLP Present: Gunnar Fusi, SLP PPS Coordinator present : Daiva Nakayama, RN, CRRN     Current Status/Progress Goal Weekly Team Focus  Medical   Pt brighter, wifeasking about prognosis  home d/c with op therapy  D/C planning   Bowel/Bladder   Continent to bowel and bladder.  To continue continent to bowel and bladder.  To monitore bladder and bowel function Q shift.   Swallow/Nutrition/ Hydration   Regular/thin; full supervision; wife okay to supervise  least restrictive PO intake with Mod I   diet toleration    ADL's   Pt currently needs min assist for all bathing and min to mod for dressing.   He  needs min assist for transfers to the toilet and shower.  Still with LUE and LLE limitations in function.  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  Decreased anticipatory and emergent awareness with slight left neglect.  supervision throughout  selfcare retraining. balance re-training, neuromuscular re-education, pt/family education   Mobility   S-min A transfers, S bed mobility and w/c mobility, min A gait using WBQC up to 150 ft, min A stairs with 1 rail  supervision overall  L NMR, gait training, balance training, postural control, L attention, pt education   Communication             Safety/Cognition/ Behavioral Observations  mod assist  downgraded to Min assist   intellectual/emergent awareness, left inattetion, problem solving    Pain     na        Skin   Skin dry and intact,no pressure wound.  To keep the skin free of pressure wounds.  To monitore skin Q shift.      *See Care Plan and progress notes for long and short-term goals.  Barriers to Discharge: still needs assist wife works    Presenter, broadcasting to Barriers:  train daughter and wife as caregivers    Discharge Planning/Teaching Needs:  Home with wife and daughter to provide 24 hr care-wife applying for SSD and is aware of the process      Team Discussion:  Upgraded to regular diet, making progress in balance and awareness of left side and safety. Mood better. Should reach  supervision overall level.  Begin family education  Revisions to Treatment Plan:  Upgraded diet to regular   Continued Need for Acute Rehabilitation Level of Care: The patient requires daily medical management by a physician with specialized training in physical medicine and rehabilitation for the following conditions: Daily direction of a multidisciplinary physical rehabilitation program to ensure safe treatment while eliciting the highest outcome that is of practical value to the patient.: Yes Daily analysis of laboratory values  and/or radiology reports with any subsequent need for medication adjustment of medical intervention for : Neurological problems;Other  Ladasha Schnackenberg, Gardiner Rhyme 07/22/2014, 1:37 PM

## 2014-07-22 NOTE — Progress Notes (Signed)
Speech Language Pathology Daily Session Note  Patient Details  Name: Samuel Shaw MRN: [id number] Date of Birth: May 25, 1961  Today's Date: 07/22/2014 SLP Individual Time: 9735-3299 SLP Individual Time Calculation (min): 45 min  Short Term Goals: Week 2: SLP Short Term Goal 1 (Week 2): Patient will consume regular textures and thin liquids with Mod I use of swallow strategies.  SLP Short Term Goal 2 (Week 2): Patient will select attention to a functional task for 15 minutes with Min A multimodal cues for redirection.  SLP Short Term Goal 3 (Week 2): Patient will identify 1 cognitive and 2 physical deficits with Min A multimodal cues.  SLP Short Term Goal 4 (Week 2): Patient will request help as needed with Mod A multimodal cues.  SLP Short Term Goal 5 (Week 2): Patient will attend to left upper extremity during functional tasks with Min A multimodal cues.  SLP Short Term Goal 6 (Week 2): Patient will demonstrate functional problem solving for basic and familiar tasks with Min A multimodal cues.   Skilled Therapeutic Interventions:  Pt was seen for skilled ST targeting cognitive goals.  Upon arrival, pt was seated upright in wheelchair with wife present at bedside.  SLP facilitated the session with a familiar task targeted in previous therapy session targeting delayed recall of new information, working memory, and functional problem solving.  Pt completed the abovementioned task with overall min assist faded to supervision cues for mental flexibility and error awareness.  Additionally, pt required overall mod cues for emergent awareness of cognitive and physical impairments as well as their impact on his functional independence in his home environment.  Pt was returned to room, where he required min cues for use of safety precautions (i.e. Apply both brakes to wheelchair).  SLP provided skilled education related to swallowing precautions (i.e. Slow rate, small bites/sips, and checking for left  pocketing).  Wife reports that she feels comfortable supervising pt during meal.  Pt's wife is now signed off for supervision during meals.  Pt left upright in wheelchair with wife present.    FIM:  Comprehension Comprehension Mode: Auditory Comprehension: 5-Follows basic conversation/direction: With no assist Expression Expression Mode: Verbal Expression: 5-Expresses complex 90% of the time/cues < 10% of the time Social Interaction Social Interaction: 5-Interacts appropriately 90% of the time - Needs monitoring or encouragement for participation or interaction. Problem Solving Problem Solving: 4-Solves basic 75 - 89% of the time/requires cueing 10 - 24% of the time Memory Memory: 4-Recognizes or recalls 75 - 89% of the time/requires cueing 10 - 24% of the time  Pain Pain Assessment Pain Assessment: No/denies pain  Therapy/Group: Individual Therapy  Duwan Adrian, Selinda Orion 07/22/2014, 8:30 PM

## 2014-07-22 NOTE — Progress Notes (Signed)
Subjective/Complaints: Wife has questions regarding disability. I do feel like he will be permanently disabled from his current job. Review of Systems - Negative except tired Objective: Vital Signs: Blood pressure 121/85, pulse 54, temperature 98 F (36.7 C), temperature source Oral, resp. rate 18, height 6' 2"  (1.88 m), weight 75 kg (165 lb 5.5 oz), SpO2 100 %. No results found. No results found for this or any previous visit (from the past 72 hour(s)).   HEENT: normal Cardio: RRR and no murmur Resp: CTA B/L and unlabored GI: BS positive and NT,ND Extremity:  Pulses positive and No Edema  Neuro: Lethargic, Flat, Cranial Nerve II-XII normal, Abnormal Sensory reduced LT left foot, Abnormal Motor 3- LDelt, 3- Left bi, tri, grip,4- L KE,3-HF, 2- ADF Abnormal FMC Ataxic/ dec FMC, Dysarthric and Inattention Musc/Skel:  Normal Gen NAD Mood and affect flat but otherwise appropriate  Assessment/Plan: 1. Functional deficits secondary to Right MCA infarct with Left HP which require 3+ hours per day of interdisciplinary therapy in a comprehensive inpatient rehab setting. Physiatrist is providing close team supervision and 24 hour management of active medical problems listed below. Physiatrist and rehab team continue to assess barriers to discharge/monitor patient progress toward functional and medical goals.  Team conference today please see physician documentation under team conference tab, met with team face-to-face to discuss problems,progress, and goals. Formulized individual treatment plan based on medical history, underlying problem and comorbidities. FIM: FIM - Bathing Bathing Steps Patient Completed: Chest, Left Arm, Right Arm, Abdomen, Front perineal area, Right upper leg, Left upper leg, Right lower leg (including foot), Left lower leg (including foot) Bathing: 4: Min-Patient completes 8-9 [id number] parts or 75+ percent  FIM - Upper Body Dressing/Undressing Upper body  dressing/undressing steps patient completed: Put head through opening of pull over shirt/dress, Pull shirt over trunk, Thread/unthread right sleeve of pullover shirt/dresss, Thread/unthread left sleeve of pullover shirt/dress Upper body dressing/undressing: 5: Supervision: Safety issues/verbal cues FIM - Lower Body Dressing/Undressing Lower body dressing/undressing steps patient completed: Thread/unthread right pants leg, Don/Doff right sock, Thread/unthread right underwear leg, Thread/unthread left underwear leg, Thread/unthread left pants leg, Pull underwear up/down, Pull pants up/down, Don/Doff right shoe Lower body dressing/undressing: 4: Min-Patient completed 75 plus % of tasks  FIM - Toileting Toileting steps completed by patient: Adjust clothing prior to toileting, Adjust clothing after toileting, Performs perineal hygiene Toileting Assistive Devices: Grab bar or rail for support Toileting: 4: Steadying assist  FIM - TRadio producerDevices: Grab bars Toilet Transfers: 4-To toilet/BSC: Min A (steadying Pt. > 75%), 4-From toilet/BSC: Min A (steadying Pt. > 75%)  FIM - Bed/Chair Transfer Bed/Chair Transfer Assistive Devices: Arm rests Bed/Chair Transfer: 5: Supine > Sit: Supervision (verbal cues/safety issues), 5: Bed > Chair or W/C: Supervision (verbal cues/safety issues)  FIM - Locomotion: Wheelchair Distance: 150 Locomotion: Wheelchair: 5: Travels 150 ft or more: maneuvers on rugs and over door sills with supervision, cueing or coaxing FIM - Locomotion: Ambulation Locomotion: Ambulation Assistive Devices: CNurse, adult Orthosis (L AFO) Ambulation/Gait Assistance: 4: Min assist Locomotion: Ambulation: 2: Travels 50 - 149 ft with minimal assistance (Pt.>75%)  Comprehension Comprehension Mode: Auditory Comprehension: 5-Follows basic conversation/direction: With no assist  Expression Expression Mode: Verbal Expression: 5-Expresses basic needs/ideas:  With extra time/assistive device  Social Interaction Social Interaction: 5-Interacts appropriately 90% of the time - Needs monitoring or encouragement for participation or interaction.  Problem Solving Problem Solving: 4-Solves basic 75 - 89% of the time/requires cueing 10 - 24% of the  time  Memory Memory: 4-Recognizes or recalls 75 - 89% of the time/requires cueing 10 - 24% of the time   Medical Problem List and Plan: 1. 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 DVT Prophylaxis/Anticoagulation: Subcutaneous heparin. Monitor platelet counts have any signs of bleeding 3. Pain Management: Tylenol as needed 4. Tobacco abuse. Nicoderm patch. Provide counseling 5. Neuropsych: This patient is capable of making decisions on his own behalf. 6. Skin/Wound Care: Routine skin checks, Callus, chronic pressure area plantar surface, adapt shoe wear 7. Fluids/Electrolytes/Nutrition: Strict I and O in follow of chemistries 8. Hyperlipidemia. Lipitor 9.  Sinus bradycardia asymptomatic no meds which contribute on list LOS (Days) [place] EVALUATION WAS PERFORMED  Cailie Bosshart E 07/22/2014, 9:26 AM

## 2014-07-23 ENCOUNTER — Inpatient Hospital Stay (HOSPITAL_COMMUNITY): Payer: BLUE CROSS/BLUE SHIELD | Admitting: Physical Therapy

## 2014-07-23 ENCOUNTER — Encounter (HOSPITAL_COMMUNITY): Payer: BLUE CROSS/BLUE SHIELD | Admitting: Occupational Therapy

## 2014-07-23 ENCOUNTER — Inpatient Hospital Stay (HOSPITAL_COMMUNITY): Payer: BLUE CROSS/BLUE SHIELD | Admitting: *Deleted

## 2014-07-23 NOTE — Progress Notes (Signed)
Speech Language Pathology Weekly Progress and Session Note  Patient Details  Name: Samuel Shaw MRN: [id number] Date of Birth: [birth year]  Beginning of progress report period: July 17, 2014 End of progress report period: July 23, 2014  Today's Date: 07/23/2014 SLP Group Time: 1400-1500 SLP Group Time Calculation (min): 60 min  Short Term Goals: Week 2: SLP Short Term Goal 1 (Week 2): Patient will consume regular textures and thin liquids with Mod I use of swallow strategies.  SLP Short Term Goal 1 - Progress (Week 2): Met SLP Short Term Goal 2 (Week 2): Patient will select attention to a functional task for 15 minutes with Min A multimodal cues for redirection.  SLP Short Term Goal 2 - Progress (Week 2): Met SLP Short Term Goal 3 (Week 2): Patient will identify 1 cognitive and 2 physical deficits with Min A multimodal cues.  SLP Short Term Goal 3 - Progress (Week 2): Met SLP Short Term Goal 4 (Week 2): Patient will request help as needed with Mod A multimodal cues.  SLP Short Term Goal 4 - Progress (Week 2): Progressing toward goal SLP Short Term Goal 5 (Week 2): Patient will attend to left upper extremity during functional tasks with Min A multimodal cues.  SLP Short Term Goal 5 - Progress (Week 2): Progressing toward goal SLP Short Term Goal 6 (Week 2): Patient will demonstrate functional problem solving for basic and familiar tasks with Min A multimodal cues.  SLP Short Term Goal 6 - Progress (Week 2): Met    New Short Term Goals: Week 3: SLP Short Term Goal 1 (Week 3): Patient will request help as needed with Mod A multimodal cues.  SLP Short Term Goal 2 (Week 3): Patient will attend to left upper extremity during functional tasks with Min A multimodal cues.  SLP Short Term Goal 3 (Week 3): Patient will demonstrate functional problem solving for basic tasks with Min multimodal cues.   Weekly Progress Updates: Patient has made functional gains and has met 4 out of 6 short  term goals this reporting period due to improved carryover of safe swallow strategies, selective attention, recall and ability to solve basic/familiar tasks.  Patient continues to require more assist with attention to left upper extremity, requesting help and problem solving basic tasks that are not familiar.  Patient and family education is ongoing. Therefore, patient would benefit from continued skilled SLP intervention in order to maximize his functional independence prior to discharge home with 24/7 assist.    Intensity: Minumum of 1-2 x/day, 30 to 90 minutes Frequency: 5 out of 7 days Duration/Length of Stay: 1/27 Treatment/Interventions: Cognitive remediation/compensation;Cueing hierarchy;Environmental controls;Functional tasks;Internal/external aids;Patient/family education;Therapeutic Activities   Daily Session  Skilled Therapeutic Interventions:  Skilled co-treatment group session with Recreational Therapist focused on addressing cognition goals; specifically, awareness of deficits and discharge planning.  Patient interacted with peers with Min cues for initiation of turns.  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  Overall, patient continues to demonstrate inconsistent emergent and anticipatory awareness of deficits.  Continue with current plan of care.     FIM:  Comprehension Comprehension Mode: Auditory Comprehension: 5-Follows basic conversation/direction: With no assist Expression Expression  Mode: Verbal Expression: 5-Expresses complex 90% of the time/cues < 10% of the time Social Interaction Social Interaction: 4-Interacts appropriately 75 - 89% of the time - Needs redirection for appropriate  language or to initiate interaction. Problem Solving Problem Solving: 4-Solves basic 75 - 89% of the time/requires cueing 10 - 24% of the time Memory Memory: 4-Recognizes or recalls 75 - 89% of the time/requires cueing 10 - 24% of the time FIM - Eating Eating Activity: 5: Supervision/cues;5: Set-up assist for open containers General    Pain Pain Assessment Pain Assessment: No/denies pain Pain Score: 0-No pain  Therapy/Group: Group Therapy  Carmelia Roller., CCC-SLP [phone]  [place] 07/23/2014, 4:44 PM

## 2014-07-23 NOTE — Progress Notes (Signed)
Physical Therapy Weekly Progress Note  Patient Details  Name: Samuel Shaw MRN: [id number] Date of Birth: 08-31-1960  Beginning of progress report period: July 16, 2014 End of progress report period: July 23, 2014  Today's Date: 07/23/2014 PT Individual Time: 0932-1032 PT Individual Time Calculation (min): 60 min   Patient has met 5 of 5 short term goals.  Pt has made excellent progress this reporting period and is on target to achieve goals of supervision at ambulatory level next week in preparation for discharge home. Pt has progressed to trialling use of SPC with ambulation but remains limited by compensations due to L hip/knee weakness. Pt demonstrates improved day-to-day carryover with extra time and improved awareness of deficits as he was able to state that he would most likely need a wheelchair at discharge in addition to AD (unsure at this time what best option for AD will be at discharge). Pt has also demonstrated improved mood and increased participation with therapy. Pt will benefit from OPPT at discharge.   Patient continues to demonstrate the following deficits: L hemiplegia, weakness, decreased endurance, impaired timing and sequencing, abnormal tone, unbalanced muscle activation, decreased midline orientation, decreased attention to left, decreased awareness, and cognitive impairments and therefore will continue to benefit from skilled PT intervention to enhance overall performance with activity tolerance, balance, postural control, ability to compensate for deficits, functional use of  left upper extremity and left lower extremity, attention and awareness.  Patient progressing toward long term goals.  Continue plan of care.  PT Short Term Goals Week 2:  PT Short Term Goal 1 (Week 2): Pt will perform bed <> w/c transfers with consistent min A. PT Short Term Goal 1 - Progress (Week 2): Met PT Short Term Goal 2 (Week 2): Pt will transfer sit <> stand with supervision. PT  Short Term Goal 2 - Progress (Week 2): Met PT Short Term Goal 3 (Week 2): Pt will ambulate 150 ft using LRAD with min A.  PT Short Term Goal 3 - Progress (Week 2): Met PT Short Term Goal 4 (Week 2): Pt will negotiate up/down 6 stairs using L rail ascending with min A.  PT Short Term Goal 4 - Progress (Week 2): Met PT Short Term Goal 5 (Week 2): Pt will propel w/c 150 ft with mod I.  PT Short Term Goal 5 - Progress (Week 2): Met Week 3:  PT Short Term Goal 1 (Week 3): = LTGs due to anticipated LOS  Skilled Therapeutic Interventions/Progress Updates:   Pt received supine in bed, agreeable to therapy. Transferred to edge of bed and donned shoes/L AFO with assist. Pt performed modified stand pivot transfers with supervision from bed > wheelchair and wheelchair <> mat table. Pt propelled wheelchair via R hemi technique x 150 ft with mod I, one cue for L leg rest management. To work on hip flexor strengthening/LLE advancement, patient stood with high mat on left side for LUE support and SPC in RUE with R foot ahead of L foot and kicked soccer ball with LLE. Pt's wife arriving and participated in observing session/initiation of family training. Gait training using SPC x 150 ft with S-steady assist, verbal/tactile cues for upright posture, L knee ext in stance, and forward gaze. To simulate home entry, pt negotiated up/down 15 stairs using L rail ascending with supervision-steady assist, ascending laterally with BUE on L rail and descending forwards. Pt's wife safely guarding pt on stairs. Pt requires 1 cue for sequencing. Pt performed floor transfer x  2 with mat for BUE support, demonstration and mod cues for sequencing. Pt requires min A for floor transfers and mod A for stand pivot transfer on compliant surface without AD. Pt reporting need for bathroom, returned to room in wheelchair and wife supervised w/c > toilet transfer with use of grab bar. Pt left sitting on toilet with wife present.   Therapy  Documentation Precautions:  Precautions Precautions: Fall Precaution Comments: left hemiparesis, left inattention Restrictions Weight Bearing Restrictions: No Pain: Pain Assessment Pain Assessment: No/denies pain  See FIM for current functional status  Therapy/Group: Individual Therapy  Laretta Alstrom 07/23/2014, 10:40 AM

## 2014-07-23 NOTE — Progress Notes (Signed)
Social Work Patient ID: Samuel Shaw, male   DOB: [birth year], 54 y.o.   MRN: [id number] [employer] has authorized pt until 1/27 with update on 1/26.  Pt and wife aware of team conference progression of goals and discharge 1/27. Pleased with his progress this week.

## 2014-07-23 NOTE — Progress Notes (Signed)
Occupational Therapy Session Note  Patient Details  Name: Samuel Shaw MRN: [id number] Date of Birth: January 23, 1961  Today's Date: 07/23/2014 OT Individual Time: 0800-0900      Short Term Goals: Week 3:  OT Short Term Goal 1 (Week 3): Pt will perform toilet transfers with min guard assist to elevated toilet. OT Short Term Goal 2 (Week 3): Pt will perform UB dressing with supervision with min questioning cueing OT Short Term Goal 3 (Week 3): Pt will perform LB bathing sit to stand with supervision. OT Short Term Goal 4 (Week 3): Pt will perform shower transfers with supervision using assistive device. OT Short Term Goal 5 (Week 3): Pt will use the LUE during dressing with min assist and no more than min instructional cueing to initiate use.   Skilled Therapeutic Interventions/Progress Updates:    Pt performed bathing and dressing this session.  Min instructional cueing needed to initiate bathing as pt was distracted with conversation and continually kept running water over his right leg for greater than 2 mins.  Once conversation was limited he was able to continue with bathing and complete with overall min assist.  Discussed with pt about his inattention to the left as well as he demonstrates decreased awareness of this.  Utilized cane for transfer in and out of the shower.  Min assist for balance with frequent lean to the left and LOB during mobility.  Pt tends to scissor his left foot with mobility which increases this LOB as well.  He was able to complete dressing sit to stand with min assist.  Min instructional cueing for orientation of protective brief.  He was able to exhibit some emergent awareness when he realized he started with the RLE instead of the left and was able to self correct without cueing.  Pt still needs max facilitation for donning left shoe and AFO at this time.    Therapy Documentation Precautions:  Precautions Precautions: Fall Precaution Comments: left hemiparesis,  left inattention Restrictions Weight Bearing Restrictions: No  Pain: Pain Assessment Pain Assessment: No/denies pain ADL: See FIM for current functional status  Therapy/Group: Individual Therapy  Latif Nazareno OTR/L 07/23/2014, 9:12 AM

## 2014-07-23 NOTE — Progress Notes (Signed)
Social Work Patient ID: Samuel Shaw, male   DOB: 1961/05/20, 54 y.o.   MRN: [id number] Met with wife to discuss team conference and making sure she received the disability paper work.  She will take to her lawyer on Tuesday when she has an Appointment.  Also discussed OP therapies and she was agreeable.  Work toward discharge on Wed.

## 2014-07-23 NOTE — Progress Notes (Signed)
Subjective/Complaints: No issues overnite Standing with sup assist to pull up depends Inc bladder Review of Systems - Negative except tired Objective: Vital Signs: Blood pressure 120/75, pulse 51, temperature 98 F (36.7 C), temperature source Oral, resp. rate 20, height 6\' 2"  (1.88 m), weight 75 kg (165 lb 5.5 oz), SpO2 99 %. No results found. No results found for this or any previous visit (from the past 72 hour(s)).   HEENT: normal Cardio: RRR and no murmur Resp: CTA B/L and unlabored GI: BS positive and NT,ND Extremity:  Pulses positive and No Edema  Neuro: Lethargic, Flat, Cranial Nerve II-XII normal, Abnormal Sensory reduced LT left foot, Abnormal Motor 3- LDelt, 3- Left bi, tri, grip,4- L KE,3-HF, 2- ADF Abnormal FMC Ataxic/ dec FMC, Dysarthric and Inattention Musc/Skel:  Normal Gen NAD Mood and affect flat but otherwise appropriate  Assessment/Plan: 1. Functional deficits secondary to Right MCA infarct with Left HP which require 3+ hours per day of interdisciplinary therapy in a comprehensive inpatient rehab setting. Physiatrist is providing close team supervision and 24 hour management of active medical problems listed below. Physiatrist and rehab team continue to assess barriers to discharge/monitor patient progress toward functional and medical goals.  FIM: FIM - Bathing Bathing Steps Patient Completed: Chest, Left Arm, Right Arm, Abdomen, Front perineal area, Right upper leg, Left upper leg, Right lower leg (including foot), Left lower leg (including foot), Buttocks Bathing: 4: Steadying assist  FIM - Upper Body Dressing/Undressing Upper body dressing/undressing steps patient completed: Put head through opening of pull over shirt/dress, Pull shirt over trunk, Thread/unthread right sleeve of pullover shirt/dresss, Thread/unthread left sleeve of pullover shirt/dress Upper body dressing/undressing: 5: Supervision: Safety issues/verbal cues FIM - Lower Body  Dressing/Undressing Lower body dressing/undressing steps patient completed: Thread/unthread right pants leg, Don/Doff right sock, Thread/unthread right underwear leg, Thread/unthread left underwear leg, Thread/unthread left pants leg, Pull underwear up/down, Pull pants up/down, Don/Doff right shoe Lower body dressing/undressing: 4: Min-Patient completed 75 plus % of tasks  FIM - Toileting Toileting steps completed by patient: Adjust clothing prior to toileting, Adjust clothing after toileting, Performs perineal hygiene Toileting Assistive Devices: Grab bar or rail for support Toileting: 4: Steadying assist  FIM - Radio producer Devices: Grab bars Toilet Transfers: 4-To toilet/BSC: Min A (steadying Pt. > 75%), 4-From toilet/BSC: Min A (steadying Pt. > 75%)  FIM - Bed/Chair Transfer Bed/Chair Transfer Assistive Devices: Arm rests Bed/Chair Transfer: 5: Supine > Sit: Supervision (verbal cues/safety issues), 4: Bed > Chair or W/C: Min A (steadying Pt. > 75%)  FIM - Locomotion: Wheelchair Distance: 150 Locomotion: Wheelchair: 6: Travels 150 ft or more, turns around, maneuvers to table, bed or toilet, negotiates 3% grade: maneuvers on rugs and over door sills independently FIM - Locomotion: Ambulation Locomotion: Ambulation Assistive Devices: [employer], Orthosis, Journalist, newspaper Ambulation/Gait Assistance: 4: Min guard, 5: Supervision Locomotion: Ambulation: 4: Travels 150 ft or more with minimal assistance (Pt.>75%)  Comprehension Comprehension Mode: Auditory Comprehension: 5-Follows basic conversation/direction: With no assist  Expression Expression Mode: Verbal Expression: 5-Expresses complex 90% of the time/cues < 10% of the time  Social Interaction Social Interaction: 5-Interacts appropriately 90% of the time - Needs monitoring or encouragement for participation or interaction.  Problem Solving Problem Solving: 4-Solves basic 75 - 89% of the  time/requires cueing 10 - 24% of the time  Memory Memory: 4-Recognizes or recalls 75 - 89% of the time/requires cueing 10 - 24% of the time   Medical Problem List and Plan:  1. 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 DVT Prophylaxis/Anticoagulation: Subcutaneous heparin. Monitor platelet counts have any signs of bleeding 3. Pain Management: Tylenol as needed 4. Tobacco abuse. Nicoderm patch. Provide counseling 5. Neuropsych: This patient is capable of making decisions on his own behalf. 6. Skin/Wound Care: Routine skin checks, Callus, chronic pressure area plantar surface, adapt shoe wear 7. Fluids/Electrolytes/Nutrition: Strict I and O in follow of chemistries 8. Hyperlipidemia. Lipitor 9.  Sinus bradycardia asymptomatic no meds which contribute on list LOS (Days) 15  A FACE TO FACE EVALUATION WAS PERFORMED  Juliet Vasbinder E 07/23/2014, 9:16 AM

## 2014-07-24 ENCOUNTER — Inpatient Hospital Stay (HOSPITAL_COMMUNITY): Payer: BLUE CROSS/BLUE SHIELD | Admitting: Physical Therapy

## 2014-07-24 ENCOUNTER — Encounter (HOSPITAL_COMMUNITY): Payer: BLUE CROSS/BLUE SHIELD | Admitting: Occupational Therapy

## 2014-07-24 ENCOUNTER — Inpatient Hospital Stay (HOSPITAL_COMMUNITY): Payer: BLUE CROSS/BLUE SHIELD | Admitting: Speech Pathology

## 2014-07-24 NOTE — Progress Notes (Signed)
Speech Language Pathology Daily Session Note  Patient Details  Name: Samuel Shaw MRN: [id number] Date of Birth: [birth year]  Today's Date: 07/24/2014 SLP Individual Time: 0930-1030 SLP Individual Time Calculation (min): 60 min  Short Term Goals: Week 3: SLP Short Term Goal 1 (Week 3): Patient will request help as needed with Mod A multimodal cues.  SLP Short Term Goal 2 (Week 3): Patient will attend to left upper extremity during functional tasks with Min A multimodal cues.  SLP Short Term Goal 3 (Week 3): Patient will demonstrate functional problem solving for basic tasks with Min multimodal cues.   Skilled Therapeutic Interventions: Skilled treatment session focused on addressing cognition goals. SLP facilitated session with new medication management task and Mod multimodal cues to utilize external aid to assist with recall of function and frequency of medication as well as Mod multimodal cues to problem solve task.  Patient also required Supervision verbal cues to self-monitor and correct errors during task.  Patient also required Min verbal cues to route find back to room with use of external aids.  Continue plan of care.   FIM:  Comprehension Comprehension Mode: Auditory Comprehension: 5-Follows basic conversation/direction: With no assist Expression Expression Mode: Verbal Expression: 5-Expresses complex 90% of the time/cues < 10% of the time Social Interaction Social Interaction: 4-Interacts appropriately 75 - 89% of the time - Needs redirection for appropriate language or to initiate interaction. Problem Solving Problem Solving: 5-Solves basic 90% of the time/requires cueing < 10% of the time Memory Memory: 4-Recognizes or recalls 75 - 89% of the time/requires cueing 10 - 24% of the time  Pain Pain Assessment Pain Assessment: No/denies pain Pain Score: 0-No pain  Therapy/Group: Individual Therapy  Carmelia Roller., CCC-SLP [phone]  [place] 07/24/2014,  11:31 AM

## 2014-07-24 NOTE — Progress Notes (Signed)
Physical Therapy Session Note  Patient Details  Name: Samuel Shaw MRN: [id number] Date of Birth: 1960/12/13  Today's Date: 07/24/2014 PT Individual Time: 3709-6438 PT Individual Time Calculation (min): 60 min   Short Term Goals: Week 3:  PT Short Term Goal 1 (Week 3): = LTGs due to anticipated LOS  Skilled Therapeutic Interventions/Progress Updates:   Pt received semi reclined in bed, agreeable to therapy. Pt transferred to edge of bed with supervision and performed modified stand pivot transfers throughout session with supervision. For L NMR, pt propelled wheelchair using BLE only with supervision-min A and verbal cues for sequencing and technique. After pt moved rooms, his wheelchair was switched out to be cleaned and current wheelchair noted to be inappropriate height and size for patient. Pt provided with 18 x 18 wheelchair and basic cushion with improved fit and comfort noted. For L NMR and timing and sequencing, pt performed NuStep using BLE only level 6 x 10 min without rest. Pt with improved ability to maintain LLE in neutral position compared to last week when his LLE would abduct and he could not correct it. Supine LLE NMR: bridging 2 x 10 while squeezing yoga block between knees, L SLR x 15, resisted heel slides x 15, resisted ankle PF x 10 (trace DF noted),  L hip abduction x 15, glute sets with 5 sec hold x 10. Gait training using SPC with L [place] AFO x 125 ft with min guard-supervision and 3 LOB to L with min A to recover. Pt left sitting in wheelchair with call button within reach.   Therapy Documentation Precautions:  Precautions Precautions: Fall Precaution Comments: left hemiparesis, left inattention Restrictions Weight Bearing Restrictions: No Vital Signs: Therapy Vitals Temp: 97.8 F (36.6 C) Temp Source: Oral Pulse Rate: (!) 42 ([place], RN aware) Resp: 18 BP: 121/82 mmHg Patient Position (if appropriate): Lying Oxygen Therapy SpO2: 100 % O2 Device: Not  Delivered Pain: Pain Assessment Pain Assessment: No/denies pain  See FIM for current functional status  Therapy/Group: Individual Therapy  Laretta Alstrom 07/24/2014, 8:50 AM

## 2014-07-24 NOTE — Progress Notes (Signed)
Subjective/Complaints: Patient denies any new problems overnight. Slept well. Therapy went well yesterday per patient  Review of Systems - Negative except tired Objective: Vital Signs: Blood pressure 121/82, pulse 42, temperature 97.8 F (36.6 C), temperature source Oral, resp. rate 18, height 6\' 2"  (1.88 m), weight 75 kg (165 lb 5.5 oz), SpO2 100 %. No results found. No results found for this or any previous visit (from the past 72 hour(s)).   HEENT: normal Cardio: RRR and no murmur Resp: CTA B/L and unlabored GI: BS positive and NT,ND Extremity:  Pulses positive and No Edema  Neuro: Lethargic, Flat, Cranial Nerve II-XII normal, Abnormal Sensory reduced LT left foot, Abnormal Motor 3- LDelt, 3- Left bi, tri, grip,4- L KE,3-HF, 3- ADF Abnormal FMC Ataxic/ dec FMC, Dysarthric and Inattention Musc/Skel:  Normal Gen NAD Mood and affect flat but otherwise appropriate  Assessment/Plan: 1. Functional deficits secondary to Right MCA infarct with Left HP which require 3+ hours per day of interdisciplinary therapy in a comprehensive inpatient rehab setting. Physiatrist is providing close team supervision and 24 hour management of active medical problems listed below. Physiatrist and rehab team continue to assess barriers to discharge/monitor patient progress toward functional and medical goals.  FIM: FIM - Bathing Bathing Steps Patient Completed: Chest, Left Arm, Right Arm, Abdomen, Front perineal area, Right upper leg, Left upper leg, Right lower leg (including foot), Left lower leg (including foot), Buttocks Bathing: 4: Steadying assist  FIM - Upper Body Dressing/Undressing Upper body dressing/undressing steps patient completed: Put head through opening of pull over shirt/dress, Pull shirt over trunk, Thread/unthread right sleeve of pullover shirt/dresss, Thread/unthread left sleeve of pullover shirt/dress Upper body dressing/undressing: 5: Supervision: Safety issues/verbal cues FIM -  Lower Body Dressing/Undressing Lower body dressing/undressing steps patient completed: Thread/unthread right pants leg, Don/Doff right sock, Thread/unthread right underwear leg, Thread/unthread left underwear leg, Thread/unthread left pants leg, Pull underwear up/down, Pull pants up/down, Don/Doff right shoe Lower body dressing/undressing: 4: Min-Patient completed 75 plus % of tasks  FIM - Toileting Toileting steps completed by patient: Adjust clothing prior to toileting, Adjust clothing after toileting, Performs perineal hygiene Toileting Assistive Devices: Grab bar or rail for support Toileting: 4: Steadying assist  FIM - Radio producer Devices: Grab bars Toilet Transfers: 4-To toilet/BSC: Min A (steadying Pt. > 75%), 4-From toilet/BSC: Min A (steadying Pt. > 75%)  FIM - Bed/Chair Transfer Bed/Chair Transfer Assistive Devices: Arm rests Bed/Chair Transfer: 5: Bed > Chair or W/C: Supervision (verbal cues/safety issues), 5: Chair or W/C > Bed: Supervision (verbal cues/safety issues), 5: Supine > Sit: Supervision (verbal cues/safety issues), 5: Sit > Supine: Supervision (verbal cues/safety issues)  FIM - Locomotion: Wheelchair Distance: 150 Locomotion: Wheelchair: 6: Travels 150 ft or more, turns around, maneuvers to table, bed or toilet, negotiates 3% grade: maneuvers on rugs and over door sills independently FIM - Locomotion: Ambulation Locomotion: Ambulation Assistive Devices: Orthosis, Journalist, newspaper Ambulation/Gait Assistance: 4: Min guard, 5: Supervision Locomotion: Ambulation: 4: Travels 150 ft or more with minimal assistance (Pt.>75%)  Comprehension Comprehension Mode: Auditory Comprehension: 5-Follows basic conversation/direction: With no assist  Expression Expression Mode: Verbal Expression: 5-Expresses complex 90% of the time/cues < 10% of the time  Social Interaction Social Interaction: 4-Interacts appropriately 75 - 89% of the time - Needs  redirection for appropriate language or to initiate interaction.  Problem Solving Problem Solving: 4-Solves basic 75 - 89% of the time/requires cueing 10 - 24% of the time  Memory Memory: 4-Recognizes or recalls 75 -  89% of the time/requires cueing 10 - 24% of the time   Medical Problem List and Plan: 1. 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 DVT Prophylaxis/Anticoagulation: Subcutaneous heparin. Monitor platelet counts have any signs of bleeding 3. Pain Management: Tylenol as needed 4. Tobacco abuse. Nicoderm patch. Provide counseling 5. Neuropsych: This patient is capable of making decisions on his own behalf. 6. Skin/Wound Care: Routine skin checks, no pain from foot callus 7. Fluids/Electrolytes/Nutrition: Strict I and O in follow of chemistries 8. Hyperlipidemia. Lipitor 9.  Sinus bradycardia asymptomatic no meds which contribute on list LOS (Days) 16  A FACE TO FACE EVALUATION WAS PERFORMED  Burk Hoctor E 07/24/2014, 8:11 AM

## 2014-07-24 NOTE — Progress Notes (Signed)
Occupational Therapy Session Note  Patient Details  Name: Samuel Shaw MRN: [id number] Date of Birth: 1960-12-19  Today's Date: 07/24/2014 OT Individual Time: 0900-1000 OT Individual Time Calculation (min): 60 min    Short Term Goals: Week 3:  OT Short Term Goal 1 (Week 3): Pt will perform toilet transfers with min guard assist to elevated toilet. OT Short Term Goal 2 (Week 3): Pt will perform UB dressing with supervision with min questioning cueing OT Short Term Goal 3 (Week 3): Pt will perform LB bathing sit to stand with supervision. OT Short Term Goal 4 (Week 3): Pt will perform shower transfers with supervision using assistive device. OT Short Term Goal 5 (Week 3): Pt will use the LUE during dressing with min assist and no more than min instructional cueing to initiate use.   Skilled Therapeutic Interventions/Progress Updates:    Pt performed shower and dressing during session.  Mod instructional cueing to wash and rinse the LUE and LLE.  Pt gets distracted with conversation and concentrates on the right side.  Min assist for all sit to stand with mod assist for transfer to and from the shower, walking with the single point cane.  He was able to perform all dressing with min assist overall but did get his pants on backward initially.  Therapist cued him they were backwards and he was able to selfcorrect.  Utilization of the LUE for pulling up pants and brief with min assist.  Max assist for the left AFO and shoe.   Therapy Documentation Precautions:  Precautions Precautions: Fall Precaution Comments: left hemiparesis, left inattention Restrictions Weight Bearing Restrictions: No  Pain: Pain Assessment Pain Assessment: No/denies pain Pain Score: 0-No pain ADL: See FIM for current functional status  Therapy/Group: Individual Therapy  Bellamia Ferch OTR/L 07/24/2014, 12:19 PM

## 2014-07-25 ENCOUNTER — Inpatient Hospital Stay (HOSPITAL_COMMUNITY): Payer: BLUE CROSS/BLUE SHIELD | Admitting: Physical Therapy

## 2014-07-25 NOTE — Progress Notes (Signed)
Subjective/Complaints: No issues. In good spirits. Denies pain  Review of Systems - Negative except tired Objective: Vital Signs: Blood pressure 122/77, pulse 48, temperature 97.9 F (36.6 C), temperature source Oral, resp. rate 17, height 6\' 2"  (1.88 m), weight 75 kg (165 lb 5.5 oz), SpO2 99 %. No results found. No results found for this or any previous visit (from the past 72 hour(s)).   HEENT: normal Cardio: RRR and no murmur Resp: CTA B/L and unlabored GI: BS positive and NT,ND Extremity:  Pulses positive and No Edema  Neuro: Lethargic, Flat, Cranial Nerve II-XII normal, Abnormal Sensory reduced LT left foot, Abnormal Motor 3- LDelt, 3- Left bi, tri, grip,4- L KE,3-HF, 3- ADF Abnormal FMC Ataxic/ dec FMC, Dysarthric and Inattention Musc/Skel:  Normal Gen NAD Mood and affect flat but otherwise appropriate  Assessment/Plan: 1. Functional deficits secondary to Right MCA infarct with Left HP which require 3+ hours per day of interdisciplinary therapy in a comprehensive inpatient rehab setting. Physiatrist is providing close team supervision and 24 hour management of active medical problems listed below. Physiatrist and rehab team continue to assess barriers to discharge/monitor patient progress toward functional and medical goals.  FIM: FIM - Bathing Bathing Steps Patient Completed: Chest, Left Arm, Right Arm, Abdomen, Front perineal area, Right upper leg, Left upper leg, Right lower leg (including foot), Left lower leg (including foot), Buttocks Bathing: 4: Steadying assist  FIM - Upper Body Dressing/Undressing Upper body dressing/undressing steps patient completed: Put head through opening of pull over shirt/dress, Pull shirt over trunk, Thread/unthread right sleeve of pullover shirt/dresss, Thread/unthread left sleeve of pullover shirt/dress Upper body dressing/undressing: 5: Supervision: Safety issues/verbal cues FIM - Lower Body Dressing/Undressing Lower body  dressing/undressing steps patient completed: Thread/unthread right pants leg, Don/Doff right sock, Thread/unthread right underwear leg, Thread/unthread left underwear leg, Thread/unthread left pants leg, Pull underwear up/down, Pull pants up/down, Don/Doff right shoe Lower body dressing/undressing: 4: Min-Patient completed 75 plus % of tasks  FIM - Toileting Toileting steps completed by patient: Adjust clothing prior to toileting, Adjust clothing after toileting, Performs perineal hygiene Toileting Assistive Devices: Grab bar or rail for support Toileting: 4: Steadying assist  FIM - Radio producer Devices: Grab bars Toilet Transfers: 4-To toilet/BSC: Min A (steadying Pt. > 75%), 4-From toilet/BSC: Min A (steadying Pt. > 75%)  FIM - Bed/Chair Transfer Bed/Chair Transfer Assistive Devices: Arm rests Bed/Chair Transfer: 5: Supine > Sit: Supervision (verbal cues/safety issues), 5: Sit > Supine: Supervision (verbal cues/safety issues), 4: Bed > Chair or W/C: Min A (steadying Pt. > 75%), 4: Chair or W/C > Bed: Min A (steadying Pt. > 75%)  FIM - Locomotion: Wheelchair Distance: 150 Locomotion: Wheelchair: 4: Travels 150 ft or more: maneuvers on rugs and over door sillls with minimal assistance (Pt.>75%) FIM - Locomotion: Ambulation Locomotion: Ambulation Assistive Devices: Orthosis, Journalist, newspaper Ambulation/Gait Assistance: 4: Min guard, 5: Supervision Locomotion: Ambulation: 4: Travels 150 ft or more with minimal assistance (Pt.>75%)  Comprehension Comprehension Mode: Auditory Comprehension: 5-Follows basic conversation/direction: With no assist  Expression Expression Mode: Verbal Expression: 5-Expresses complex 90% of the time/cues < 10% of the time  Social Interaction Social Interaction: 4-Interacts appropriately 75 - 89% of the time - Needs redirection for appropriate language or to initiate interaction.  Problem Solving Problem Solving: 5-Solves basic  90% of the time/requires cueing < 10% of the time  Memory Memory: 4-Recognizes or recalls 75 - 89% of the time/requires cueing 10 - 24% of the time   Medical  Problem List and Plan: 1. 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 DVT Prophylaxis/Anticoagulation: Subcutaneous heparin. Monitor platelet counts have any signs of bleeding 3. Pain Management: Tylenol as needed 4. Tobacco abuse. Nicoderm patch. Provide counseling 5. Neuropsych: This patient is capable of making decisions on his own behalf. 6. Skin/Wound Care: Routine skin checks, no pain from foot callus 7. Fluids/Electrolytes/Nutrition: Strict I and O in follow of chemistries 8. Hyperlipidemia. Lipitor 9.  Sinus bradycardia asymptomatic no meds which contribute on list LOS (Days) 17  A FACE TO FACE EVALUATION WAS PERFORMED  SWARTZ,ZACHARY T 07/25/2014, 11:51 AM

## 2014-07-26 ENCOUNTER — Inpatient Hospital Stay (HOSPITAL_COMMUNITY): Payer: BLUE CROSS/BLUE SHIELD | Admitting: *Deleted

## 2014-07-26 NOTE — Progress Notes (Signed)
Subjective/Complaints: No complaints. Denies pain. Appetite ok  Review of Systems - Negative except tired--doesn't offer much given flat affect Objective: Vital Signs: Blood pressure 118/69, pulse 50, temperature 98.2 F (36.8 C), temperature source Oral, resp. rate 17, height 6\' 2"  (1.88 m), weight 75 kg (165 lb 5.5 oz), SpO2 100 %. No results found. No results found for this or any previous visit (from the past 72 hour(s)).   HEENT: normal Cardio: RRR and no murmur Resp: CTA B/L and unlabored GI: BS positive and NT,ND Extremity:  Pulses positive and No Edema  Neuro: Lethargic, Flat, Cranial Nerve II-XII normal, Abnormal Sensory reduced LT left foot, Abnormal Motor 3- LDelt, 3- Left bi, tri, grip,4- L KE,3-HF, 3- ADF Abnormal FMC Ataxic/ dec FMC, Dysarthric and Inattention Musc/Skel:  Normal Gen NAD Mood and affect flat but otherwise appropriate  Assessment/Plan: 1. Functional deficits secondary to Right MCA infarct with Left HP which require 3+ hours per day of interdisciplinary therapy in a comprehensive inpatient rehab setting. Physiatrist is providing close team supervision and 24 hour management of active medical problems listed below. Physiatrist and rehab team continue to assess barriers to discharge/monitor patient progress toward functional and medical goals.  FIM: FIM - Bathing Bathing Steps Patient Completed: Chest, Left Arm, Right Arm, Abdomen, Front perineal area, Right upper leg, Left upper leg, Right lower leg (including foot), Left lower leg (including foot), Buttocks Bathing: 4: Steadying assist  FIM - Upper Body Dressing/Undressing Upper body dressing/undressing steps patient completed: Put head through opening of pull over shirt/dress, Pull shirt over trunk, Thread/unthread right sleeve of pullover shirt/dresss, Thread/unthread left sleeve of pullover shirt/dress Upper body dressing/undressing: 5: Supervision: Safety issues/verbal cues FIM - Lower Body  Dressing/Undressing Lower body dressing/undressing steps patient completed: Thread/unthread right pants leg, Don/Doff right sock, Thread/unthread right underwear leg, Thread/unthread left underwear leg, Thread/unthread left pants leg, Pull underwear up/down, Pull pants up/down, Don/Doff right shoe Lower body dressing/undressing: 4: Min-Patient completed 75 plus % of tasks  FIM - Toileting Toileting steps completed by patient: Adjust clothing prior to toileting, Adjust clothing after toileting, Performs perineal hygiene Toileting Assistive Devices: Grab bar or rail for support Toileting: 4: Steadying assist  FIM - Radio producer Devices: Grab bars Toilet Transfers: 4-To toilet/BSC: Min A (steadying Pt. > 75%), 4-From toilet/BSC: Min A (steadying Pt. > 75%)  FIM - Bed/Chair Transfer Bed/Chair Transfer Assistive Devices: Arm rests Bed/Chair Transfer: 5: Supine > Sit: Supervision (verbal cues/safety issues), 5: Sit > Supine: Supervision (verbal cues/safety issues), 4: Bed > Chair or W/C: Min A (steadying Pt. > 75%), 4: Chair or W/C > Bed: Min A (steadying Pt. > 75%)  FIM - Locomotion: Wheelchair Distance: 150 Locomotion: Wheelchair: 4: Travels 150 ft or more: maneuvers on rugs and over door sillls with minimal assistance (Pt.>75%) FIM - Locomotion: Ambulation Locomotion: Ambulation Assistive Devices: Orthosis, Journalist, newspaper Ambulation/Gait Assistance: 4: Min guard, 5: Supervision Locomotion: Ambulation: 4: Travels 150 ft or more with minimal assistance (Pt.>75%)  Comprehension Comprehension Mode: Auditory Comprehension: 5-Follows basic conversation/direction: With no assist  Expression Expression Mode: Verbal Expression: 5-Expresses complex 90% of the time/cues < 10% of the time  Social Interaction Social Interaction: 4-Interacts appropriately 75 - 89% of the time - Needs redirection for appropriate language or to initiate interaction.  Problem  Solving Problem Solving: 5-Solves basic 90% of the time/requires cueing < 10% of the time  Memory Memory: 4-Recognizes or recalls 75 - 89% of the time/requires cueing 10 - 24% of the  time   Medical Problem List and Plan: 1. 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 DVT Prophylaxis/Anticoagulation: Subcutaneous heparin. Monitor platelet counts have any signs of bleeding 3. Pain Management: Tylenol as needed 4. Tobacco abuse. Nicoderm patch. Provide counseling 5. Neuropsych: This patient is capable of making decisions on his own behalf. 6. Skin/Wound Care: Routine skin checks, no pain from foot callus 7. Fluids/Electrolytes/Nutrition: encourage po intake 8. Hyperlipidemia. Lipitor 9.  Sinus bradycardia asymptomatic no meds which contribute on list LOS (Days) 18  A FACE TO FACE EVALUATION WAS PERFORMED  Jadie Comas T 07/26/2014, 8:51 AM

## 2014-07-27 ENCOUNTER — Inpatient Hospital Stay (HOSPITAL_COMMUNITY): Payer: BLUE CROSS/BLUE SHIELD

## 2014-07-27 ENCOUNTER — Inpatient Hospital Stay (HOSPITAL_COMMUNITY): Payer: BLUE CROSS/BLUE SHIELD | Admitting: Occupational Therapy

## 2014-07-27 ENCOUNTER — Inpatient Hospital Stay (HOSPITAL_COMMUNITY): Payer: BLUE CROSS/BLUE SHIELD | Admitting: Physical Therapy

## 2014-07-27 ENCOUNTER — Inpatient Hospital Stay (HOSPITAL_COMMUNITY): Payer: BLUE CROSS/BLUE SHIELD | Admitting: Speech Pathology

## 2014-07-27 NOTE — Progress Notes (Signed)
Physical Therapy Session Note  Patient Details  Name: Samuel Shaw MRN: [id number] Date of Birth: September 07, 1960  Today's Date: 07/27/2014 PT Individual Time: 1100-1200 PT Individual Time Calculation (min): 60 min   Short Term Goals: Week 3:  PT Short Term Goal 1 (Week 3): = LTGs due to anticipated LOS  Skilled Therapeutic Interventions/Progress Updates:   Pt received sitting in wheelchair agreeable to therapy. Session focused on family training and community ambulation. Wife joining therapy session later for hands-on training. Patient propelled wheelchair to/from gym using R hemi technique with supervision for brake and L leg rest management. Pt ambulated this session with use of WBQC instead of SPC as patient will most likely discharge home with [facility] in order to achieve supervision household level goals for increased safety. Gait training x 200 ft in controlled environment with wife providing supervision, verbal cues for upright posture, forward gaze, and L knee ext in stance. Pt performed car transfer to sedan height with supervision and demonstrated tub bench in ADL bathroom with supervision and verbal cues for technique. Discussed benefits of grab bar placement for improved safety as pt progresses. Pt ambulated from rehab unit <> elevators <> mildly crowded hospital lobby <> carpeted gift shop using [facility] with overall supervision and 2 seated rest breaks. Pt recalled 3/3 items to locate in gift shop, needed min cues to locate them. Wife provided safe and appropriate supervision and cues. Pt requires min A to regain balance after standing from armless chair with table in front as he placed the cane across his body and crossed RLE in front of LLE, total cues to re-attempt transfer by standing and sidestepping with quad cane in order to safely get up from sitting at table. After returning to rehab unit, initiated [employer] Scale (to be completed during next therapy session). Pt and wife with no  questions or concerns regarding discharge home. Pt left sitting in wheelchair to return to room with wife.   Therapy Documentation Precautions:  Precautions Precautions: Fall Precaution Comments: left hemiparesis, left inattention Restrictions Weight Bearing Restrictions: No Pain: Pain Assessment Pain Assessment: No/denies pain Pain Score: 0-No pain Balance: Balance Balance Assessed: Yes Standardized Balance Assessment Standardized Balance Assessment: Berg Balance Test Berg Balance Test Sit to Stand: Able to stand without using hands and stabilize independently Standing Unsupported: Able to stand safely 2 minutes Sitting with Back Unsupported but Feet Supported on Floor or Stool: Able to sit safely and securely 2 minutes Stand to Sit: Sits safely with minimal use of hands Transfers: Able to transfer safely, minor use of hands Standing Unsupported with Eyes Closed: Able to stand 10 seconds with supervision Standing Ubsupported with Feet Together: Able to place feet together independently and stand for 1 minute with supervision From Standing, Reach Forward with Outstretched Arm: Can reach confidently >25 cm (10") From Standing Position, Pick up Object from Floor: Able to pick up shoe safely and easily From Standing Position, Turn to Look Behind Over each Shoulder: Looks behind one side only/other side shows less weight shift  (Remainder of outcome measure to be completed at later time) See FIM for current functional status  Therapy/Group: Individual Therapy  Laretta Alstrom 07/27/2014, 12:07 PM

## 2014-07-27 NOTE — Discharge Instructions (Signed)
Inpatient Rehab Discharge Instructions  Samuel Shaw Discharge date and time: No discharge date for patient encounter.   Activities/Precautions/ Functional Status: Activity: activity as tolerated Diet: regular diet Wound Care: none needed Functional status:  ___ No restrictions     ___ Walk up steps independently __x_ 24/7 supervision/assistance   ___ Walk up steps with assistance ___ Intermittent supervision/assistance  ___ Bathe/dress independently ___ Walk with walker     ___ Bathe/dress with assistance ___ Walk Independently    ___ Shower independently _x__ Walk with assistance    ___ Shower with assistance ___ No alcohol     ___ Return to work/school ________  Special Instructions: No smoking or driving    COMMUNITY REFERRALS UPON DISCHARGE:     Outpatient: PT, SP, OT  Agency:[facility] QVZDG:[phone] Date of Last Service:07/29/2014  Appointment Date/Time:FEBRUARY 2 TUESDAY 2;15-4;45 PM  Medical Equipment/Items Ordered:WHEELCHAIR, LBQC, BEDSIDE COMMODE & TUB BENCH  Agency/Supplier:[facility]    [phone] Other:[facility] WIFE TO PURSUE THIS   GENERAL COMMUNITY RESOURCES FOR PATIENT/FAMILY: Support Groups:[facility] SUPPORT GROUP  STROKE/TIA DISCHARGE INSTRUCTIONS SMOKING Cigarette smoking nearly doubles your risk of having a stroke & is the single most alterable risk factor  If you smoke or have smoked in the last 12 months, you are advised to quit smoking for your health.  Most of the excess cardiovascular risk related to smoking disappears within a year of stopping.  Ask you doctor about anti-smoking medications   Quit Line: 1-800-QUIT NOW  Free Smoking Cessation Classes [phone]  CHOLESTEROL Know your levels; limit fat & cholesterol in your diet  Lipid Panel     Component Value Date/Time   CHOL 182 07/07/2014 0402   TRIG 116 07/07/2014 0402   HDL 24* 07/07/2014 0402   CHOLHDL 7.6 07/07/2014 0402   VLDL 23 07/07/2014 0402   LDLCALC  135* 07/07/2014 0402      Many patients benefit from treatment even if their cholesterol is at goal.  Goal: Total Cholesterol (CHOL) less than 160  Goal:  Triglycerides (TRIG) less than 150  Goal:  HDL greater than 40  Goal:  LDL (LDLCALC) less than 100   BLOOD PRESSURE American Stroke Association blood pressure target is less that 120/80 mm/Hg  Your discharge blood pressure is:  BP: 121/85 mmHg  Monitor your blood pressure  Limit your salt and alcohol intake  Many individuals will require more than one medication for high blood pressure  DIABETES (A1c is a blood sugar average for last 3 months) Goal HGBA1c is under 7% (HBGA1c is blood sugar average for last 3 months)  Diabetes: No known diagnosis of diabetes    Lab Results  Component Value Date   HGBA1C 5.6 07/07/2014     Your HGBA1c can be lowered with medications, healthy diet, and exercise.  Check your blood sugar as directed by your physician  Call your physician if you experience unexplained or low blood sugars.  PHYSICAL ACTIVITY/REHABILITATION Goal is 30 minutes at least 4 days per week  Activity: Increase activity slowly, Therapies: Physical Therapy: Home Health Return to work:   Activity decreases your risk of heart attack and stroke and makes your heart stronger.  It helps control your weight and blood pressure; helps you relax and can improve your mood.  Participate in a regular exercise program.  Talk with your doctor about the best form of exercise for you (dancing, walking, swimming, cycling).  DIET/WEIGHT Goal is to maintain a healthy weight  Your discharge  diet is: Diet regular  liquids Your height is:  Height: 6\' 2"  (188 cm) Your current weight is: Weight: 75 kg (165 lb 5.5 oz) Your Body Mass Index (BMI) is:     Following the type of diet specifically designed for you will help prevent another stroke.  Your goal weight range is:    Your goal Body Mass Index (BMI) is 19-24.  Healthy food habits  can help reduce 3 risk factors for stroke:  High cholesterol, hypertension, and excess weight.  RESOURCES Stroke/Support Group:  Call [phone]   STROKE EDUCATION PROVIDED/REVIEWED AND GIVEN TO PATIENT Stroke warning signs and symptoms How to activate emergency medical system (call 911). Medications prescribed at discharge. Need for follow-up after discharge. Personal risk factors for stroke. Pneumonia vaccine given:  Flu vaccine given:  My questions have been answered, the writing is legible, and I understand these instructions.  I will adhere to these goals & educational materials that have been provided to me after my discharge from the hospital.       My questions have been answered and I understand these instructions. I will adhere to these goals and the provided educational materials after my discharge from the hospital.  Patient/Caregiver Signature _______________________________ Date __________  Clinician Signature _______________________________________ Date __________  Please bring this form and your medication list with you to all your follow-up doctor's appointments.

## 2014-07-27 NOTE — Progress Notes (Signed)
Speech Language Pathology Daily Session Note  Patient Details  Name: Samuel Shaw MRN: [id number] Date of Birth: [birth year]  Today's Date: 07/27/2014 SLP Individual Time: 1430-1500 SLP Individual Time Calculation (min): 30 min  Short Term Goals: Week 3: SLP Short Term Goal 1 (Week 3): Patient will request help as needed with Mod A multimodal cues.  SLP Short Term Goal 2 (Week 3): Patient will attend to left upper extremity during functional tasks with Min A multimodal cues.  SLP Short Term Goal 3 (Week 3): Patient will demonstrate functional problem solving for basic tasks with Min multimodal cues.   Skilled Therapeutic Interventions: Skilled treatment session focused on addressing cognition goals. SLP facilitated session with Mod faded to Min multimodal cues to attend to and utilize left upper extremity during structured functional tasks.  SLP also facilitated session with discussion regarding type of assist he will need upon discharge; however, given severity of deficits in the area of anticipatory awareness patient required Max question cues.  Plan fr wrap up of family education with spouse tomorrow.  Continue plan of care.   FIM:  Comprehension Comprehension Mode: Auditory Comprehension: 5-Follows basic conversation/direction: With no assist Expression Expression Mode: Verbal Expression: 6-Expresses complex ideas: With extra time/assistive device Social Interaction Social Interaction: 6-Interacts appropriately with others with medication or extra time (anti-anxiety, antidepressant). Problem Solving Problem Solving: 5-Solves basic 90% of the time/requires cueing < 10% of the time Memory Memory: 4-Recognizes or recalls 75 - 89% of the time/requires cueing 10 - 24% of the time  Pain Pain Assessment Pain Assessment: No/denies pain  Therapy/Group: Individual Therapy  Carmelia Roller., CCC-SLP [phone]  [place] 07/27/2014, 4:30 PM

## 2014-07-27 NOTE — Progress Notes (Signed)
Subjective/Complaints: Looking forward to D/C Wife still has some family training  Review of Systems - Negative except tired Objective: Vital Signs: Blood pressure 123/81, pulse 59, temperature 97.7 F (36.5 C), temperature source Oral, resp. rate 16, height 6\' 2"  (1.88 m), weight 75 kg (165 lb 5.5 oz), SpO2 99 %. No results found. No results found for this or any previous visit (from the past 72 hour(s)).   HEENT: normal Cardio: RRR and no murmur Resp: CTA B/L and unlabored GI: BS positive and NT,ND Extremity:  Pulses positive and No Edema  Neuro:Alert, Flat, Cranial Nerve II-XII normal, Abnormal Sensory reduced LT left foot, Abnormal Motor 3- LDelt, 3- Left bi, tri, grip,4- L KE,4-HF, 3- ADF Abnormal FMC Ataxic/ dec FMC, Dysarthric and Inattention Musc/Skel:  Normal Gen NAD Mood and affect flat but otherwise appropriate  Assessment/Plan: 1. Functional deficits secondary to Right MCA infarct with Left HP which require 3+ hours per day of interdisciplinary therapy in a comprehensive inpatient rehab setting. Physiatrist is providing close team supervision and 24 hour management of active medical problems listed below. Physiatrist and rehab team continue to assess barriers to discharge/monitor patient progress toward functional and medical goals.  FIM: FIM - Bathing Bathing Steps Patient Completed: Chest, Left Arm, Right Arm, Abdomen, Front perineal area, Right upper leg, Left upper leg, Right lower leg (including foot), Left lower leg (including foot), Buttocks Bathing: 4: Steadying assist  FIM - Upper Body Dressing/Undressing Upper body dressing/undressing steps patient completed: Put head through opening of pull over shirt/dress, Pull shirt over trunk, Thread/unthread right sleeve of pullover shirt/dresss, Thread/unthread left sleeve of pullover shirt/dress Upper body dressing/undressing: 5: Supervision: Safety issues/verbal cues FIM - Lower Body Dressing/Undressing Lower body  dressing/undressing steps patient completed: Thread/unthread right pants leg, Don/Doff right sock, Thread/unthread right underwear leg, Thread/unthread left underwear leg, Thread/unthread left pants leg, Pull underwear up/down, Pull pants up/down, Don/Doff right shoe Lower body dressing/undressing: 4: Min-Patient completed 75 plus % of tasks  FIM - Toileting Toileting steps completed by patient: Adjust clothing prior to toileting, Adjust clothing after toileting, Performs perineal hygiene Toileting Assistive Devices: Grab bar or rail for support Toileting: 4: Steadying assist  FIM - Radio producer Devices: Grab bars Toilet Transfers: 4-To toilet/BSC: Min A (steadying Pt. > 75%), 4-From toilet/BSC: Min A (steadying Pt. > 75%)  FIM - Bed/Chair Transfer Bed/Chair Transfer Assistive Devices: Arm rests Bed/Chair Transfer: 5: Supine > Sit: Supervision (verbal cues/safety issues), 5: Sit > Supine: Supervision (verbal cues/safety issues), 4: Bed > Chair or W/C: Min A (steadying Pt. > 75%), 4: Chair or W/C > Bed: Min A (steadying Pt. > 75%)  FIM - Locomotion: Wheelchair Distance: 150 Locomotion: Wheelchair: 4: Travels 150 ft or more: maneuvers on rugs and over door sillls with minimal assistance (Pt.>75%) FIM - Locomotion: Ambulation Locomotion: Ambulation Assistive Devices: Orthosis, Journalist, newspaper Ambulation/Gait Assistance: 4: Min guard, 5: Supervision Locomotion: Ambulation: 4: Travels 150 ft or more with minimal assistance (Pt.>75%)  Comprehension Comprehension Mode: Auditory Comprehension: 5-Follows basic conversation/direction: With no assist  Expression Expression Mode: Verbal Expression: 5-Expresses complex 90% of the time/cues < 10% of the time  Social Interaction Social Interaction: 4-Interacts appropriately 75 - 89% of the time - Needs redirection for appropriate language or to initiate interaction.  Problem Solving Problem Solving: 5-Solves basic  90% of the time/requires cueing < 10% of the time  Memory Memory: 4-Recognizes or recalls 75 - 89% of the time/requires cueing 10 - 24% of the time  Medical Problem List and Plan: 1. 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 DVT Prophylaxis/Anticoagulation: Subcutaneous heparin. Monitor platelet counts have any signs of bleeding 3. Pain Management: Tylenol as needed 4. Tobacco abuse. Nicoderm patch. Provide counseling 5. Neuropsych: This patient is capable of making decisions on his own behalf. 6. Skin/Wound Care: Routine skin checks, no pain from foot callus 7. Fluids/Electrolytes/Nutrition: Strict I and O in follow of chemistries 8. Hyperlipidemia. Lipitor 9.  Sinus bradycardia asymptomatic no meds which contribute on list LOS (Days) [place] EVALUATION WAS PERFORMED  KIRSTEINS,ANDREW E 07/27/2014, 9:34 AM

## 2014-07-27 NOTE — Plan of Care (Signed)
Problem: RH Simple Meal Prep Goal: LTG Patient will perform simple meal prep w/assist (OT) LTG: Patient will perform simple meal prep with assistance, with/without cues (OT).  Outcome: Not Applicable Date Met:  47/07/61 Pt will have assistance for meal prep.

## 2014-07-27 NOTE — Progress Notes (Signed)
Orthopedic Tech Progress Note Patient Details:  Samuel Shaw 11-Nov-1960 [id number] [facility] called for brace order. Spoke with Percell Locus to place order. Patient ID: Samuel Shaw, male   DOB: 1960-12-06, 54 y.o.   MRN: [id number]   Fenton Foy 07/27/2014, 4:55 PM

## 2014-07-27 NOTE — Progress Notes (Signed)
Occupational Therapy Session Note  Patient Details  Name: Samuel Shaw MRN: [id number] Date of Birth: Apr 11, 1961  Today's Date: 07/27/2014 OT Individual Time: 0900-1000 OT Individual Time Calculation (min): 60 min    Short Term Goals: Week 3:  OT Short Term Goal 1 (Week 3): Pt will perform toilet transfers with min guard assist to elevated toilet. OT Short Term Goal 2 (Week 3): Pt will perform UB dressing with supervision with min questioning cueing OT Short Term Goal 3 (Week 3): Pt will perform LB bathing sit to stand with supervision. OT Short Term Goal 4 (Week 3): Pt will perform shower transfers with supervision using assistive device. OT Short Term Goal 5 (Week 3): Pt will use the LUE during dressing with min assist and no more than min instructional cueing to initiate use.   Skilled Therapeutic Interventions/Progress Updates:    Pt performed shower and dressing during session.  Min assist for transfer to the walk-in shower with single point cane.  Increased LOB to the left 2-3 times, especially with left turns.  He was able to complete all bathing sit to stand with overall supervision.  Increased time needed to integrate the LUE but able to do so during bathing with supervision.  Pt still with decreased attention to the left side at times as well as decreased initiation but completed bathing without cueing.  Dressing sit to stand at the sink.  Pt supervision this session secondary to not having AFO in the left shoe.  Needed mod instructional cueing to finish pulling up brief and pants on the left side.    Therapy Documentation Precautions:  Precautions Precautions: Fall Precaution Comments: left hemiparesis, left inattention Restrictions Weight Bearing Restrictions: No  Pain: Pain Assessment Pain Assessment: No/denies pain Pain Score: 0-No pain ADL: See FIM for current functional status  Therapy/Group: Individual Therapy  Llewellyn Choplin 07/27/2014, 12:18 PM

## 2014-07-27 NOTE — Progress Notes (Signed)
Physical Therapy Session Note  Patient Details  Name: Samuel Shaw MRN: [id number] Date of Birth: [birth year]  Today's Date: 07/27/2014 PT Individual Time: 03-1404 PT Individual Time Calculation (min): 30 min   Short Term Goals: Week 3:  PT Short Term Goal 1 (Week 3): = LTGs due to anticipated LOS    Skilled Therapeutic Interventions/Progress Updates:   Pt's brother-in-law observed and assisted.  Bed> w/c> mat stand pivot with close supervision.  tx focused on balance assessment; Patient demonstrates increased fall risk as noted by score of  38 /56 on Berg Balance Scale.  (<36= high risk for falls, close to 100%; 37-45 significant >80%; 46-51 moderate >50%; 52-55 lower >25%).  Falls risk discussed with pt.   neuromuscular re-education via demo, manual cues, VCS, visual cues for: -trunk shortening/lengthening/rotating with reaching out of BOS to L and R -L hip flexion to place foot onto 6" high stool     Therapy Documentation Precautions:  Precautions Precautions: Fall Precaution Comments: left hemiparesis, left inattention Restrictions Weight Bearing Restrictions: No   Pain: Pain Assessment Pain Assessment: No/denies pain    Balance: Balance Balance Assessed: Yes Standardized Balance Assessment Standardized Balance Assessment: Berg Balance Test Berg Balance Test Sit to Stand: Able to stand without using hands and stabilize independently Standing Unsupported: Able to stand safely 2 minutes Sitting with Back Unsupported but Feet Supported on Floor or Stool: Able to sit safely and securely 2 minutes Stand to Sit: Sits safely with minimal use of hands Transfers: Able to transfer safely, minor use of hands Standing Unsupported with Eyes Closed: Able to stand 10 seconds with supervision Standing Ubsupported with Feet Together: Able to place feet together independently and stand for 1 minute with supervision From Standing, Reach Forward with Outstretched Arm: Can reach  confidently >25 cm (10") From Standing Position, Pick up Object from Floor: Able to pick up shoe safely and easily From Standing Position, Turn to Look Behind Over each Shoulder: Looks behind one side only/other side shows less weight shift Turn 360 Degrees: Needs assistance while turning Standing Unsupported, Alternately Place Feet on Step/Stool: Needs assistance to keep from falling or unable to try Standing Unsupported, One Foot in Front: Needs help to step but can hold 15 seconds Standing on One Leg: Unable to try or needs assist to prevent fall Total Score: 38   See FIM for current functional status  Therapy/Group: Individual Therapy  [name] 07/27/2014, 3:28 PM

## 2014-07-28 ENCOUNTER — Encounter (HOSPITAL_COMMUNITY): Payer: BLUE CROSS/BLUE SHIELD | Admitting: Occupational Therapy

## 2014-07-28 ENCOUNTER — Inpatient Hospital Stay (HOSPITAL_COMMUNITY): Payer: BLUE CROSS/BLUE SHIELD | Admitting: Physical Therapy

## 2014-07-28 ENCOUNTER — Inpatient Hospital Stay (HOSPITAL_COMMUNITY): Payer: BLUE CROSS/BLUE SHIELD | Admitting: Speech Pathology

## 2014-07-28 MED ORDER — NICOTINE 14 MG/24HR TD PT24
14.0000 mg | MEDICATED_PATCH | Freq: Every day | TRANSDERMAL | Status: DC
  Administered 2014-07-28 – 2014-07-29 (×2): 14 mg via TRANSDERMAL
  Filled 2014-07-28 (×4): qty 1

## 2014-07-28 NOTE — Progress Notes (Addendum)
Subjective/Complaints:  Patient in bed today complaints. Completing family training.  Review of Systems - Negative except tired Objective: Vital Signs: Blood pressure 117/67, pulse 50, temperature 98.2 F (36.8 C), temperature source Oral, resp. rate 18, height 6\' 2"  (1.88 m), weight 75 kg (165 lb 5.5 oz), SpO2 98 %. No results found. No results found for this or any previous visit (from the past 72 hour(s)).   HEENT: normal Cardio: RRR and no murmur Resp: CTA B/L and unlabored GI: BS positive and NT,ND Extremity:  Pulses positive and No Edema  Musc/Skel:  Normal Gen NAD Mood and affect flat but otherwise appropriate  Assessment/Plan: 1. Functional deficits secondary to Right MCA infarct with Left HP which require 3+ hours per day of interdisciplinary therapy in a comprehensive inpatient rehab setting. Physiatrist is providing close team supervision and 24 hour management of active medical problems listed below. Physiatrist and rehab team continue to assess barriers to discharge/monitor patient progress toward functional and medical goals. Plan discharge in the morning FIM: FIM - Bathing Bathing Steps Patient Completed: Chest, Left Arm, Right Arm, Abdomen, Front perineal area, Right upper leg, Left upper leg, Right lower leg (including foot), Left lower leg (including foot), Buttocks Bathing: 5: Supervision: Safety issues/verbal cues  FIM - Upper Body Dressing/Undressing Upper body dressing/undressing steps patient completed: Put head through opening of pull over shirt/dress, Pull shirt over trunk, Thread/unthread right sleeve of pullover shirt/dresss, Thread/unthread left sleeve of pullover shirt/dress Upper body dressing/undressing: 5: Supervision: Safety issues/verbal cues FIM - Lower Body Dressing/Undressing Lower body dressing/undressing steps patient completed: Thread/unthread right pants leg, Thread/unthread right underwear leg, Thread/unthread left underwear leg,  Thread/unthread left pants leg, Pull underwear up/down, Pull pants up/down, Don/Doff right shoe, Don/Doff left shoe, Fasten/unfasten right shoe, Fasten/unfasten left shoe Lower body dressing/undressing: 5: Supervision: Safety issues/verbal cues  FIM - Toileting Toileting steps completed by patient: Adjust clothing prior to toileting, Adjust clothing after toileting, Performs perineal hygiene Toileting Assistive Devices: Grab bar or rail for support Toileting: 4: Steadying assist  FIM - Radio producer Devices: Product manager Transfers: 5-To toilet/BSC: Supervision (verbal cues/safety issues), 5-From toilet/BSC: Supervision (verbal cues/safety issues)  FIM - Control and instrumentation engineer Devices: Lobbyist Transfer: 5: Supine > Sit: Supervision (verbal cues/safety issues), 5: Bed > Chair or W/C: Supervision (verbal cues/safety issues), 5: Chair or W/C > Bed: Supervision (verbal cues/safety issues)  FIM - Locomotion: Wheelchair Distance: 150 ft Locomotion: Wheelchair: 5: Travels 150 ft or more: maneuvers on rugs and over door sills with supervision, cueing or coaxing FIM - Locomotion: Ambulation Locomotion: Ambulation Assistive Devices: Orthosis, Nurse, adult Ambulation/Gait Assistance: 5: Supervision Locomotion: Ambulation: 5: Travels 150 ft or more with supervision/safety issues  Comprehension Comprehension Mode: Auditory Comprehension: 5-Follows basic conversation/direction: With no assist  Expression Expression Mode: Verbal Expression: 6-Expresses complex ideas: With extra time/assistive device  Social Interaction Social Interaction: 6-Interacts appropriately with others with medication or extra time (anti-anxiety, antidepressant).  Problem Solving Problem Solving: 5-Solves basic 90% of the time/requires cueing < 10% of the time  Memory Memory: 4-Recognizes or recalls 75 - 89% of the time/requires cueing 10 - 24%  of the time   Medical Problem List and Plan: 1. 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 DVT Prophylaxis/Anticoagulation: Subcutaneous heparin. Monitor platelet counts have any signs of bleeding 3. Pain Management: Tylenol as needed 4. Tobacco abuse. Nicoderm patch. Provide counseling 5.  Neuropsych: This patient is capable of making decisions on his own behalf. 6. Skin/Wound Care: Routine skin checks, no pain from foot callus 7. Fluids/Electrolytes/Nutrition: Strict I and O in follow of chemistries 8. Hyperlipidemia. Lipitor 9.  Sinus bradycardia asymptomatic no meds which contribute on list LOS (Days) [place] EVALUATION WAS PERFORMED  KIRSTEINS,ANDREW E 07/28/2014, 8:51 AM

## 2014-07-28 NOTE — Discharge Summary (Signed)
Discharge summary job [id number]

## 2014-07-28 NOTE — Plan of Care (Signed)
Problem: RH Floor Transfers Goal: LTG Patient will perform floor transfers w/assist (PT) LTG: Patient will perform floor transfers with assistance (PT).  Outcome: Not Met (add Reason) Pt requires min assist to complete floor transfer safely.

## 2014-07-28 NOTE — Progress Notes (Signed)
Speech Language Pathology Discharge Summary  Patient Details  Name: Samuel Shaw MRN: [id number] Date of Birth: 03-16-1961  Today's Date: 07/28/2014 SLP Individual Time: 1300-1400 SLP Individual Time Calculation (min): 60 min   Skilled Therapeutic Interventions:   Skilled treatment session focused on addressing cognitive deficits as well as wrap up of patient and family education.  Patient required Min multimodal cues for emergent awareness of errors as well as  For attention to left upper extremity during functional tasks.  Wife present for session and verbalized understanding of recommended compensatory memory strategies for home.  Patient continues to require Min assist multimodal cues for awareness of and ability to compensate for verbal and functional memory deficits.  Patient and family ready for discharge.     Patient has met 4 of 4 long term goals.  Patient to discharge at overall Supervision;Min level.  Reasons goals not met: n/a   Clinical Impression/Discharge Summary: Patient has made functional gains and has met 4 out of 4 long term goals during his Inpatient Rehabilitation stay due to improved carryover of safe swallow strategies, selective attention, emergent awareness and ability to solve basic tasks.  Patient is Mod I with use of swallow strategies while consuming regular textures and thin liquids after initial set-up assist.  Patient continues to require Min assist with attention to left upper extremity and emergent awareness, but he only requires Supervision for other basic/familiar cognitive tasks.  Patient and family education has been completed and patient will discharge home with 24/7 assist from family.  Patient would continue to benefit from follow up outpatient skilled SLP intervention in order to maximize his functional independence and further reduce burden of care.  Care Partner:  Caregiver Able to Provide Assistance: Yes  Type of Caregiver Assistance:  Physical;Cognitive  Recommendation:  Outpatient SLP;24 hour supervision/assistance  Rationale for SLP Follow Up: Maximize cognitive function and independence;Reduce caregiver burden   Equipment: none   Reasons for discharge: Treatment goals met;Discharged from hospital   Patient/Family Agrees with Progress Made and Goals Achieved: Yes   See FIM for current functional status  Carmelia Roller., CCC-SLP [place] 07/28/2014, 4:35 PM

## 2014-07-28 NOTE — Progress Notes (Signed)
Physical Therapy Discharge Summary  Patient Details  Name: Samuel Shaw MRN: [id number] Date of Birth: 13-Jul-1960  Today's Date: 07/28/2014 PT Individual Time: 1000-1105 PT Individual Time Calculation (min): 65 min    Patient has met 10 of 11 long term goals due to improved activity tolerance, improved balance, improved postural control, increased strength, increased range of motion, ability to compensate for deficits, functional use of  left upper extremity and left lower extremity, improved attention and improved awareness.  Patient to discharge at an ambulatory level Supervision.  Patient's care partner is independent to provide the necessary physical and cognitive assistance at discharge.  Reasons goals not met: Patient requires min assist to complete floor transfer safely.   Recommendation:  Patient will benefit from ongoing skilled PT services in outpatient setting to continue to advance safe functional mobility, address ongoing impairments in left hemiplegia, weakness, postural/trunk control, decreased endurance, decreased midline orientation, decreased attention to left, decreased balance, decreased balance strategies, cognitive impairments, and minimize fall risk.  Equipment: WBQC and 18 x 18 manual wheelchair  Reasons for discharge: treatment goals met and discharge from hospital  Patient/family agrees with progress made and goals achieved: Yes  Skilled Therapeutic Interventions Patient at overall supervision level for all mobility with use of quad cane except min A to complete floor transfer. Berg Balance Scale improved to 38/56 from 22/56 on evaluation. Pt instructed in neuromuscular re-education via demo, manual cues, visual cues for trunk shortening/lengthening/rotating with scooting without UE support on mat, anterior pelvic tilt, posture, and increased LLE weightbearing during mobility. Pt with no further questions regarding discharge. See below for details regarding  discharge summary.   PT Discharge Precautions/Restrictions Precautions Precautions: Fall Precaution Comments: left hemiparesis, left inattention Restrictions Weight Bearing Restrictions: No Pain Pain Assessment Pain Assessment: No/denies pain Vision/Perception  Vision - Assessment Eye Alignment: Within Functional Limits Ocular Range of Motion: Within Functional Limits  Cognition Overall Cognitive Status: Impaired/Different from baseline Arousal/Alertness: Awake/alert Orientation Level: Oriented X4 Attention: Focused;Sustained;Selective Focused Attention: Appears intact Sustained Attention: Appears intact Selective Attention: Impaired Selective Attention Impairment: Functional basic;Functional complex Memory: Impaired Memory Impairment: Decreased short term memory Decreased Short Term Memory: Functional basic;Functional complex Awareness: Impaired Awareness Impairment: Anticipatory impairment;Emergent impairment Problem Solving: Impaired Problem Solving Impairment: Functional complex;Functional basic Safety/Judgment: Impaired Comments: Pt still with decreased anticipatory awareness as with one discipline he states he needs assistance for transfers and will not attempt to get up however when asked what help he will need at home from his daughters he reports none.  Pt still with decreased selective attention noted in selfcare tasks if he is ditstracted it will take him longer to sequence through bathing tasks.   Sensation Sensation Light Touch: Appears Intact Stereognosis: Appears Intact Hot/Cold: Appears Intact Proprioception: Appears Intact Coordination Gross Motor Movements are Fluid and Coordinated: No Fine Motor Movements are Fluid and Coordinated: No Coordination and Movement Description: Pt developing compensatory movement strategies due to LLE hemiplegia (posteriorly rotated hip during ambulation, decreased L LE WB) but responds well to cues Motor  Motor Motor:  Hemiplegia;Abnormal postural alignment and control Motor - Discharge Observations: mild left hemiplegia, forward flexed posture, pt maintains posterior pelvic tilt, decreased LLE weightbearing  Mobility Bed Mobility Bed Mobility: Supine to Sit;Sit to Supine Supine to Sit: 6: Modified independent (Device/Increase time) Sit to Supine: 6: Modified independent (Device/Increase time) Transfers Transfers: Yes Sit to Stand: 5: Supervision;With upper extremity assist;Without upper extremity assist;With armrests;From chair/3-in-1 Stand to Sit: 5: Supervision;With upper extremity assist;Without upper extremity assist;With  armrests;To chair/3-in-1 Squat Pivot Transfers: 5: Supervision;With upper extremity assistance;With armrests Locomotion  Ambulation Ambulation: Yes Ambulation/Gait Assistance: 5: Supervision Ambulation Distance (Feet): 150 Feet Assistive device: Large base quad cane Ambulation/Gait Assistance Details: Verbal cues for precautions/safety Ambulation/Gait Assistance Details: verbal cues for upright posture, forward gaze, L knee extension in stance Gait Gait: Yes Gait Pattern: Impaired Gait Pattern: Step-to pattern;Step-through pattern;Decreased stance time - left;Decreased dorsiflexion - left;Decreased weight shift to left;Left flexed knee in stance;Narrow base of support;Trunk rotated posteriorly on left Gait velocity: 10 MWT = 0.21 m/s  High Level Ambulation High Level Ambulation: Side stepping;Backwards walking Side Stepping: S Backwards Walking: S Stairs / Additional Locomotion Stairs: Yes Stairs Assistance: 5: Supervision Stair Management Technique: One rail Right;Step to pattern;Forwards Number of Stairs: 12 Height of Stairs: 6 Wheelchair Mobility Wheelchair Mobility: Yes Wheelchair Assistance: 6: Modified independent (Device/Increase time) Environmental health practitioner: Right upper extremity;Right lower extremity Wheelchair Parts Management: Supervision/cueing Distance: 150   Trunk/Postural Assessment  Cervical Assessment Cervical Assessment: Exceptions to [facility] (L lateral flexion and R rotation) Thoracic Assessment Thoracic Assessment: Within Functional Limits Lumbar Assessment Lumbar Assessment: Within Functional Limits Postural Control Postural Control: Deficits on evaluation Trunk Control: unable to perform lateral trunk shortening/elongation Protective Responses: Delayed Postural Limitations: maintains posterior pelvic tilt, rounded shoulders, forward head  Balance Balance Balance Assessed: Yes Standardized Balance Assessment Standardized Balance Assessment: Berg Balance Test;Timed Up and Go Test Berg Balance Test Sit to Stand: Able to stand without using hands and stabilize independently Standing Unsupported: Able to stand safely 2 minutes Sitting with Back Unsupported but Feet Supported on Floor or Stool: Able to sit safely and securely 2 minutes Stand to Sit: Sits safely with minimal use of hands Transfers: Able to transfer safely, minor use of hands Standing Unsupported with Eyes Closed: Able to stand 10 seconds with supervision Standing Ubsupported with Feet Together: Able to place feet together independently and stand for 1 minute with supervision From Standing, Reach Forward with Outstretched Arm: Can reach confidently >25 cm (10") From Standing Position, Pick up Object from Floor: Able to pick up shoe safely and easily From Standing Position, Turn to Look Behind Over each Shoulder: Looks behind one side only/other side shows less weight shift Turn 360 Degrees: Needs assistance while turning Standing Unsupported, Alternately Place Feet on Step/Stool: Needs assistance to keep from falling or unable to try Standing Unsupported, One Foot in Front: Needs help to step but can hold 15 seconds Standing on One Leg: Unable to try or needs assist to prevent fall Total Score: 38 Timed Up and Go Test TUG: Normal TUG Normal TUG (seconds): 61 (using WBQC,  supervision level) Static Sitting Balance Static Sitting - Balance Support: Feet supported Static Sitting - Level of Assistance: 6: Modified independent (Device/Increase time) Dynamic Sitting Balance Dynamic Sitting - Balance Support: No upper extremity supported;During functional activity Dynamic Sitting - Level of Assistance: 6: Modified independent (Device/Increase time) Static Standing Balance Static Standing - Balance Support: Right upper extremity supported Static Standing - Level of Assistance: 5: Stand by assistance Dynamic Standing Balance Dynamic Standing - Balance Support: Right upper extremity supported;During functional activity Dynamic Standing - Level of Assistance: 5: Stand by assistance Dynamic Standing - Balance Activities: Lateral lean/weight shifting;Forward lean/weight shifting Extremity Assessment  RUE Assessment RUE Assessment: Within Functional Limits LUE Assessment LUE Assessment: Exceptions to [facility] LUE Strength LUE Overall Strength Comments: Pt currently presents at a Brunnstrum stage IV-V movement in the arm and hand.  Synergy pattern still present in the shoulder with some  iloslated AROM noted in the elbow.  Brunnstrum stage VI in the hand with opposition from thumb to all digits.  He uses it functionally as an active assist with all bathing and dressing but still with decreased ability to incorporate it fully secondary to neglect and motor planning deficits.       LLE Assessment LLE Assessment: Exceptions to [facility] LLE Strength LLE Overall Strength: Deficits LLE Overall Strength Comments: 3-/5 hip/knee flexion and ankle DF, 4/5 knee extension  See FIM for current functional status  Laretta Alstrom 07/28/2014, 12:23 PM

## 2014-07-28 NOTE — Progress Notes (Signed)
Occupational Therapy Discharge Summary  Patient Details  Name: Samuel Shaw MRN: [id number] Date of Birth: 01/16/1961   Session note: Pt able to ambulate to the walk-in shower with large base quad cane and supervision.  He was able to complete all aspects of bathing with supervision sit to stand.  Transferred back out to the wheelchair at the sink for dressing also with supervision.  He was able to complete all dressing with supervision as well except for donning the left shoe with AFO.  For this he needed max assist.  Grooming tasks completed at wheelchair level with modified independent.  Pt is able to use the LUE as an active assist for all bathing and dressing tasks.  Still with difficulty coordinating movements however.  Still needs mod instructional cueing to integrate into pulling pants over his feet and using it to help pull his pants over his hips.  He still needed assistance for donning left shoe and AFO at this time.  Pt educated on FM coordination tasks and provided a handout as well.    Today's Date: 07/28/2014 OT Individual Time: 0900-1000 OT Individual Time Calculation (min): 60 min    Patient has met 12 of 12 long term goals due to improved activity tolerance, improved balance, ability to compensate for deficits, functional use of  LEFT upper and LEFT lower extremity, improved attention, improved awareness and improved coordination.  Patient to discharge at overall Supervision level.  Patient's care partner is independent to provide the necessary physical and cognitive assistance at discharge.    Reasons goals not met: NA meal prep goal discharge earlier as pt will have assistance initially from wife and daughters.   Recommendation:  Patient will benefit from ongoing skilled OT services in outpatient setting to continue to advance functional skills in the area of BADL.  Pt still demonstrates decreased LUE and LLE strength, coordination, and timing.  He still demonstrates  decreased safety awareness as well as decreased selective attention and slight left in-attention.  Recommend 24 hour supervision for safety as pt is a high fall risk with transfers and mobility.  Pt and wife have been instructed on safe performance of selfcare tasks.      Equipment: 3:1, tub bench  Reasons for discharge: treatment goals met and discharge from hospital  Patient/family agrees with progress made and goals achieved: Yes  OT Discharge Precautions/Restrictions  Precautions Precautions: Fall Precaution Comments: left hemiparesis, left inattention Restrictions Weight Bearing Restrictions: No  Pain Pain Assessment Pain Assessment: No/denies pain ADL  See FIM scale for details         Vision/Perception  Vision- History Baseline Vision/History: No visual deficits Patient Visual Report: No change from baseline Vision- Assessment Vision Assessment?: Yes Eye Alignment: Within Functional Limits Ocular Range of Motion: Within Functional Limits Alignment/Gaze Preference: Within Defined Limits Tracking/Visual Pursuits: Able to track stimulus in all quads without difficulty Saccades: Within functional limits Convergence: Within functional limits Visual Fields: No apparent deficits Additional Comments: Pt maintains head more at midline. Praxis Praxis-Other Comments: Pt still demonstrates some decreased motor planning with use of the LUE even though his isolated AROM has improved significantly.  When attempting to peform functional tasks he continues to exhibit difficulty with shoulder movements accompanied with elbow extension   Cognition Overall Cognitive Status: Impaired/Different from baseline Arousal/Alertness: Awake/alert Orientation Level: Oriented X4 Attention: Focused;Sustained;Selective Focused Attention: Appears intact Sustained Attention: Appears intact Selective Attention: Impaired Selective Attention Impairment: Functional basic;Functional complex Memory:  Impaired Memory Impairment: Decreased short term memory  Decreased Short Term Memory: Functional basic;Functional complex Awareness: Impaired Awareness Impairment: Anticipatory impairment;Emergent impairment Problem Solving: Impaired Problem Solving Impairment: Functional complex;Functional basic Safety/Judgment: Impaired Comments: Pt still with decreased anticipatory awareness as with one discipline he states he needs assistance for transfers and will not attempt to get up however when asked what help he will need at home from his daughters he reports none.  Pt still with decreased selective attention noted in selfcare tasks if he is ditstracted it will take him longer to sequence through bathing tasks.   Sensation Sensation Light Touch: Appears Intact Stereognosis: Appears Intact Hot/Cold: Appears Intact Proprioception: Appears Intact Coordination Gross Motor Movements are Fluid and Coordinated: No Fine Motor Movements are Fluid and Coordinated: No Coordination and Movement Description: Pt still with Brunnstrum synergy pattern ilevel 4-5 n the left shoulder with functional movement.  Isolated Brunnstrum stage VI movement noted in the hand with pt being able to exhibit full opposition of the thumb to all digits.     Motor  Motor Motor: Hemiplegia;Abnormal postural alignment and control Motor - Discharge Observations: mild left hemiplegia, forward flexed posture, pt maintains posterior pelvic tilt, decreased LLE weightbearing and decreased ability to straighten out the left knee in standing.   Mobility  Bed Mobility Bed Mobility: Supine to Sit;Sit to Supine Supine to Sit: 6: Modified independent (Device/Increase time) Sit to Supine: 6: Modified independent (Device/Increase time) Transfers Transfers: Sit to Stand;Stand to Sit Sit to Stand: 5: Supervision;With upper extremity assist;Without upper extremity assist;With armrests;From chair/3-in-1 Sit to Stand Details: Verbal cues for  technique Stand to Sit: 5: Supervision;With upper extremity assist;Without upper extremity assist;With armrests;To chair/3-in-1 Stand to Sit Details (indicate cue type and reason): Verbal cues for technique  Trunk/Postural Assessment  Thoracic Assessment Thoracic Assessment: Within Functional Limits Lumbar Assessment Lumbar Assessment: Within Functional Limits Postural Control Trunk Control: unable to perform lateral trunk shortening/elongation Postural Limitations: maintains posterior pelvic tilt, rounded shoulders, forward head  Balance Balance Balance Assessed: Yes Standardized Balance Assessment Standardized Balance Assessment: Berg Balance Test;Timed Up and Go Test Berg Balance Test Sit to Stand: Able to stand without using hands and stabilize independently Standing Unsupported: Able to stand safely 2 minutes Sitting with Back Unsupported but Feet Supported on Floor or Stool: Able to sit safely and securely 2 minutes Stand to Sit: Sits safely with minimal use of hands Transfers: Able to transfer safely, minor use of hands Standing Unsupported with Eyes Closed: Able to stand 10 seconds with supervision Standing Ubsupported with Feet Together: Able to place feet together independently and stand for 1 minute with supervision From Standing, Reach Forward with Outstretched Arm: Can reach confidently >25 cm (10") From Standing Position, Pick up Object from Floor: Able to pick up shoe safely and easily From Standing Position, Turn to Look Behind Over each Shoulder: Looks behind one side only/other side shows less weight shift Turn 360 Degrees: Needs assistance while turning Standing Unsupported, Alternately Place Feet on Step/Stool: Needs assistance to keep from falling or unable to try Standing Unsupported, One Foot in Front: Needs help to step but can hold 15 seconds Standing on One Leg: Unable to try or needs assist to prevent fall Total Score: 38 Timed Up and Go Test TUG: Normal  TUG Normal TUG (seconds): 61 (using WBQC, supervision level) Static Sitting Balance Static Sitting - Balance Support: Feet supported;Right upper extremity supported Static Sitting - Level of Assistance: 6: Modified independent (Device/Increase time) Dynamic Sitting Balance Dynamic Sitting - Balance Support: No upper extremity supported;During functional  activity Dynamic Sitting - Level of Assistance: 6: Modified independent (Device/Increase time) Static Standing Balance Static Standing - Balance Support: Right upper extremity supported Static Standing - Level of Assistance: 5: Stand by assistance Dynamic Standing Balance Dynamic Standing - Balance Support: Right upper extremity supported;During functional activity Dynamic Standing - Level of Assistance: 4: Min assist Dynamic Standing - Balance Activities: Reaching for objects;Reaching across midline Dynamic Standing - Comments: Pt still with LOB to the left during dynamic functional tasks.  Extremity/Trunk Assessment RUE Assessment RUE Assessment: Within Functional Limits LUE Assessment LUE Assessment: Exceptions to [facility] LUE Strength LUE Overall Strength Comments: Pt currently presents at a Brunnstrum stage IV-V movement in the arm and hand.  Synergy pattern still present in the shoulder with some iloslated AROM noted in the elbow.  Brunnstrum stage VI in the hand with opposition from thumb to all digits.  He uses it functionally as an active assist with all bathing and dressing but still with decreased ability to incorporate it fully secondary to neglect and motor planning deficits.      See FIM for current functional status  Jennilee Demarco OTR/L 07/28/2014, 12:39 PM

## 2014-07-28 NOTE — Discharge Summary (Signed)
NAMEMarland Kitchen  Samuel Shaw, Samuel Shaw NO.:  [id number]  MEDICAL RECORD NO.:  [id number]  LOCATION:  4W09C                        FACILITY:  [facility]  PHYSICIAN:  Charlett Blake, M.D.DATE OF BIRTH:  03-25-1961  DATE OF ADMISSION:  07/08/2014 DATE OF DISCHARGE:  07/29/2014                              DISCHARGE SUMMARY   DISCHARGE DIAGNOSES: 1. Functional deficits secondary to right middle cerebral artery     infarction, anterior occipital and basal ganglia. 2. Subcutaneous heparin for DVT prophylaxis. 3. Tobacco abuse. 4. Hyperlipidemia. 5. Sinus bradycardia asymptomatic.  HISTORY OF PRESENT ILLNESS:  This is a 54 year old right-handed African- American male, history of tobacco abuse, TIA 2 years ago, reported CVA late December with recent discharge from hospital in [place] where he was visiting and maintained on aspirin 325 mg daily.  The patient lives with his wife in [place].  Presented July 06, 2014 with progressive left-sided weakness and slurred speech since recent discharge from the hospital.  CT scan reviewed from outside hospital in December showed acute right basal ganglia infarct.  There was possible right ICA stenosis identified on carotid ultrasound.  MRI of the brain showed acute multifocal infarcts within the right middle cerebral artery territory including right basal ganglia, right anterior occipital and right middle cerebral artery territory.  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  CTA of the neck showed right ICA occlusion 1 cm above its origin.  Echocardiogram showed ejection fraction 60%, no wall motion abnormalities.  The patient did not receive tPA.  Neurology Service was consulted, maintained on aspirin therapy.  Subcutaneous heparin for DVT prophylaxis.  Physical and occupational therapy ongoing.  The patient was admitted for a comprehensive rehab program.  PAST MEDICAL HISTORY:  See discharge diagnoses.  SOCIAL HISTORY:  Lives with  spouse, independent prior to December.  FUNCTIONAL STATUS UPON ADMISSION TO REHAB SERVICES:  Moderate assist sit to stand, +2 physical assist to ambulate to take a few steps, moderate assist supine to sit, mod to max assist activities of daily living.  PHYSICAL EXAMINATION:  VITAL SIGNS:  Blood pressure 131/79, pulse 46, temperature 97, respirations 18. GENERAL:  This was an alert male, flat affect, speech slightly slurred but intelligible, fair awareness of deficits, mild sensory changes on the left, as well as left inattention. LUNGS:  Clear to auscultation. CARDIAC:  Regular rate and rhythm. ABDOMEN:  Soft, nontender.  Good bowel sounds.  REHABILITATION HOSPITAL COURSE:  The patient was admitted to Inpatient Rehab Services with therapies initiated on a 3-hour daily basis consisting of physical therapy, occupational therapy, speech therapy, and rehabilitation nursing.  The following issues were addressed during the patient's rehabilitation stay.  Pertaining to Samuel Shaw functional deficits secondary to right middle cerebral artery infarction, he remained on aspirin therapy, would be followed up with Neurology Services.  He remained on subcutaneous heparin for DVT prophylaxis, no bleeding episodes.  He did have a history of tobacco abuse.  He was on a NicoDerm patch.  He received full counseling in regards to cessation of nicotine products.  It was questionable if he would be compliant with these request.  Lipitor had been added for hyperlipidemia.  He did have some  increased tone on the left side, placed on Zanaflex.  Noted sinus bradycardia 48-53.  The patient asymptomatic.  He would follow up with his PCP, Dr. Iona Shaw.  The patient received weekly collaborative interdisciplinary team conferences to discuss estimated length of stay, family teaching, any barriers to his discharge.  Sessions focused on family training, community ambulation, wife joining in with all education.   The patient propelled his wheelchair to and from the GM with supervision.  Ambulated with a wide base quad cane 200 feet in a controlled environment.  He was fitted with an AFO brace.  Performed car transfers to a sedan height with supervision.  Activities of daily living.  Working on balance issues attending to the left side.  He was able to complete all bathing sit to stand with overall supervision. Full family teaching was completed.  Plan was discharged to home with ongoing therapies dictated per [facility].  DISCHARGE MEDICATIONS: 1. Aspirin 325 mg p.o. daily. 2. Lipitor 20 mg p.o. daily. 3. Nicoderm patch 14 mg daily, taper as directed. 4. Zanaflex 2 mg p.o. t.i.d.  DIET:  Regular.  SPECIAL INSTRUCTIONS:  The patient would follow up Dr. Alysia Shaw at the outpatient rehab center August 28, 2014; Dr. Erlinda Shaw, Neurology Services 1 month call for appointment; Dr. Iona Shaw, medical management, August 19, 2014.  Special instructions:  No driving, no smoking.     Lauraine Rinne, P.A.   ______________________________ Charlett Blake, M.D.    DA/MEDQ  D:  07/28/2014  T:  07/28/2014  Job:  [id number]  cc:   Barrie Folk. Berdine Addison, MD

## 2014-07-28 NOTE — Progress Notes (Signed)
NUTRITION FOLLOW UP  INTERVENTION: Provide nourishment snack ([place] sandwich). Ordered.  Encourage adequate PO intake.  NUTRITION DIAGNOSIS: Inadequate oral intake related to dislike of food as evidenced by meal completion of 25-65%; improved  Goal: Pt to meet >/= 90% of their estimated nutrition needs; met  Monitor:  PO intake, weight trends, labs, I/O's  54 y.o. male  Admitting Dx: Stroke  ASSESSMENT: Pt with history of tobacco abuse, TIA 2 years ago, CVA late December. Presented 07/06/2014 with progressive left-sided weakness and slurred speech. CT scan reviewed from outside hospital in December showed acute right basal ganglia infarct.  Meal completion has been varied from 50-100%. Pt reports having a great appetite with no other difficulties. Will continue with current interventions. Pt was encouraged to eat his food at meals.  Labs and medications reviewed.  Height: Ht Readings from Last 1 Encounters:  07/08/14 6' 2"  (1.88 m)    Weight: Wt Readings from Last 1 Encounters:  07/16/14 165 lb 5.5 oz (75 kg)    BMI:  Body Mass Index: 22.95 kg/(m^2)  Re-Estimated Nutritional Needs: Kcal: 2000-2200 Protein: 90-100 grams Fluid: 2 - 2.2 L/day  Skin: intact  Diet Order: Diet regular   Intake/Output Summary (Last 24 hours) at 07/28/14 1434 Last data filed at 07/28/14 0845  Gross per 24 hour  Intake    420 ml  Output    351 ml  Net     69 ml    Last BM: 1/25  Labs:  No results for input(s): NA, K, CL, CO2, BUN, CREATININE, CALCIUM, MG, PHOS, GLUCOSE in the last 168 hours.  CBG (last 3)  No results for input(s): GLUCAP in the last 72 hours.  Scheduled Meds: . aspirin EC  325 mg Oral Daily  . atorvastatin  20 mg Oral q1800  . heparin  5,000 Units Subcutaneous 3 times per day  . nicotine  14 mg Transdermal Daily  . senna-docusate  2 tablet Oral BID  . tiZANidine  2 mg Oral TID    Continuous Infusions:   Past Medical History  Diagnosis Date  .  Stroke     History reviewed. No pertinent past surgical history.  Kallie Locks, MS, RD, LDN Pager # [phone] After hours/ weekend pager # [phone]

## 2014-07-29 MED ORDER — ASPIRIN 325 MG PO TABS: 325.0000 mg | ORAL_TABLET | Freq: Every day | ORAL | Status: DC

## 2014-07-29 MED ORDER — FOLIC ACID 400 MCG PO TABS
400.0000 ug | ORAL_TABLET | Freq: Every day | ORAL | Status: DC
Start: 2014-07-29 — End: 2017-09-18

## 2014-07-29 MED ORDER — NICOTINE 14 MG/24HR TD PT24: MEDICATED_PATCH | TRANSDERMAL | Status: DC

## 2014-07-29 MED ORDER — ATORVASTATIN CALCIUM 20 MG PO TABS: 20.0000 mg | ORAL_TABLET | Freq: Every day | ORAL | Status: AC

## 2014-07-29 MED ORDER — TIZANIDINE HCL 2 MG PO TABS: 2.0000 mg | ORAL_TABLET | Freq: Three times a day (TID) | ORAL | Status: DC

## 2014-07-29 NOTE — Progress Notes (Signed)
Subjective/Complaints: No problems overnite Patient in bed today complaints. Completing family training.  Objective: Vital Signs: Blood pressure 112/66, pulse 52, temperature 97.4 F (36.3 C), temperature source Oral, resp. rate 18, height 6\' 2"  (1.88 m), weight 75 kg (165 lb 5.5 oz), SpO2 100 %. No results found. No results found for this or any previous visit (from the past 72 hour(s)).   HEENT: normal Cardio: RRR and no murmur Resp: CTA B/L and unlabored GI: BS positive and NT,ND Extremity:  Pulses positive and No Edema  Musc/Skel:  Normal Gen NAD Mood and affect flat but otherwise appropriate  Assessment/Plan: 1. Functional deficits secondary to Right MCA infarct with Left HP which require 3+ hours per day of interdisciplinary therapy in a comprehensive inpatient rehab setting. Physiatrist is providing close team supervision and 24 hour management of active medical problems listed below. Physiatrist and rehab team continue to assess barriers to discharge/monitor patient progress toward functional and medical goals. Stable for D/C today F/u PCP in 1-2 weeks F/u PM&R 3 weeks See D/C summary See D/C instructions FIM: FIM - Bathing Bathing Steps Patient Completed: Chest, Left Arm, Right Arm, Abdomen, Front perineal area, Right upper leg, Left upper leg, Right lower leg (including foot), Left lower leg (including foot), Buttocks Bathing: 5: Supervision: Safety issues/verbal cues  FIM - Upper Body Dressing/Undressing Upper body dressing/undressing steps patient completed: Put head through opening of pull over shirt/dress, Pull shirt over trunk, Thread/unthread right sleeve of pullover shirt/dresss, Thread/unthread left sleeve of pullover shirt/dress Upper body dressing/undressing: 5: Supervision: Safety issues/verbal cues FIM - Lower Body Dressing/Undressing Lower body dressing/undressing steps patient completed: Thread/unthread right pants leg, Thread/unthread right underwear  leg, Thread/unthread left underwear leg, Thread/unthread left pants leg, Pull underwear up/down, Pull pants up/down, Don/Doff right shoe, Don/Doff left shoe, Fasten/unfasten right shoe, Fasten/unfasten left shoe Lower body dressing/undressing: 4: Min-Patient completed 75 plus % of tasks  FIM - Toileting Toileting steps completed by patient: Adjust clothing prior to toileting, Adjust clothing after toileting, Performs perineal hygiene Toileting Assistive Devices: Grab bar or rail for support Toileting: 5: Supervision: Safety issues/verbal cues  FIM - Radio producer Devices: Grab bars, Cane, Elevated toilet seat Toilet Transfers: 5-To toilet/BSC: Supervision (verbal cues/safety issues), 5-From toilet/BSC: Supervision (verbal cues/safety issues)  FIM - Control and instrumentation engineer Devices: Lobbyist Transfer: 5: Bed > Chair or W/C: Supervision (verbal cues/safety issues), 5: Chair or W/C > Bed: Supervision (verbal cues/safety issues), 6: Supine > Sit: No assist, 6: Sit > Supine: No assist  FIM - Locomotion: Wheelchair Distance: 150 Locomotion: Wheelchair: 6: Travels 150 ft or more, turns around, maneuvers to table, bed or toilet, negotiates 3% grade: maneuvers on rugs and over door sills independently FIM - Locomotion: Ambulation Locomotion: Ambulation Assistive Devices: Orthosis, Nurse, adult Ambulation/Gait Assistance: 5: Supervision Locomotion: Ambulation: 5: Travels 150 ft or more with supervision/safety issues  Comprehension Comprehension Mode: Auditory Comprehension: 5-Follows basic conversation/direction: With no assist  Expression Expression Mode: Verbal Expression: 6-Expresses complex ideas: With extra time/assistive device  Social Interaction Social Interaction: 6-Interacts appropriately with others with medication or extra time (anti-anxiety, antidepressant).  Problem Solving Problem Solving: 5-Solves basic 90%  of the time/requires cueing < 10% of the time  Memory Memory: 4-Recognizes or recalls 75 - 89% of the time/requires cueing 10 - 24% of the time   Medical Problem List and Plan: 1. Functional deficits secondary to right MCA infarct(anterior occipital and basal ganglia) with left hemiparesis and visual-spatial deficits, AFO in  use 2. DVT Prophylaxis/Anticoagulation: Subcutaneous heparin. Monitor platelet counts have any signs of bleeding 3. Pain Management: Tylenol as needed 4. Tobacco abuse. Nicoderm patch. Provide counseling 5. Neuropsych: This patient is capable of making decisions on his own behalf. 6. Skin/Wound Care: Routine skin checks, no pain from foot callus 7. Fluids/Electrolytes/Nutrition: Strict I and O in follow of chemistries 8. Hyperlipidemia. Lipitor 9.  Sinus bradycardia asymptomatic no meds which contribute on list LOS (Days) 21  A FACE TO FACE EVALUATION WAS PERFORMED  KIRSTEINS,ANDREW E 07/29/2014, 8:54 AM

## 2014-07-29 NOTE — Progress Notes (Signed)
Social Work Discharge Note Discharge Note  The overall goal for the admission was met for:   Discharge location: [facility]  Length of Stay: Yes-21 DAYS  Discharge activity level: Yes-SUPERVISION/MIN LEVEL  Home/community participation: Yes  Services provided included: MD, RD, PT, OT, SLP, RN, CM, TR, Pharmacy, Neuropsych and SW  Financial Services: Private Insurance: [employer]  Follow-up services arranged: Outpatient: [facility],PT,SP 2/2 2;15-4;45 PM, DME: [facility], [facility], [facility], BEDSIDE COM[facility] and Patient/Family has no preference for HH/DME agencies  Comments (or additional information):FAMILY EDUCATION COMPLETED AND AL FEEL PREPARED FOR DISCHARGE TODAY. WIFE PURSUING SSD APPLICATIONS WITH HER LAWYER AND OTHER ANY OTHER ELIGIBLE RESOURCES  Patient/Family verbalized understanding of follow-up arrangements: Yes  Individual responsible for coordination of the follow-up plan: BOBBIE-WIFE & PATIENT  Confirmed correct DME delivered: Elease Hashimoto 07/29/2014    Elease Hashimoto

## 2014-07-29 NOTE — Progress Notes (Signed)
Patient given discharge instruction with wife at bedside. All questions answered and demonstrated understanding. Patient assisted to car via wheelchair with the help of nurse tech. [place]

## 2014-08-04 ENCOUNTER — Ambulatory Visit: Payer: Medicaid Other | Admitting: Occupational Therapy

## 2014-08-04 ENCOUNTER — Ambulatory Visit: Payer: Medicaid Other

## 2014-08-04 ENCOUNTER — Encounter: Payer: Self-pay | Admitting: Occupational Therapy

## 2014-08-04 ENCOUNTER — Ambulatory Visit: Payer: Medicaid Other | Attending: Physical Medicine & Rehabilitation

## 2014-08-04 DIAGNOSIS — R41841 Cognitive communication deficit: Secondary | ICD-10-CM

## 2014-08-04 DIAGNOSIS — G8194 Hemiplegia, unspecified affecting left nondominant side: Secondary | ICD-10-CM

## 2014-08-04 DIAGNOSIS — I69398 Other sequelae of cerebral infarction: Secondary | ICD-10-CM | POA: Diagnosis not present

## 2014-08-04 DIAGNOSIS — F17200 Nicotine dependence, unspecified, uncomplicated: Secondary | ICD-10-CM | POA: Diagnosis not present

## 2014-08-04 DIAGNOSIS — R262 Difficulty in walking, not elsewhere classified: Secondary | ICD-10-CM

## 2014-08-04 DIAGNOSIS — R279 Unspecified lack of coordination: Secondary | ICD-10-CM | POA: Insufficient documentation

## 2014-08-04 DIAGNOSIS — Z7409 Other reduced mobility: Secondary | ICD-10-CM

## 2014-08-04 DIAGNOSIS — R4189 Other symptoms and signs involving cognitive functions and awareness: Secondary | ICD-10-CM

## 2014-08-04 DIAGNOSIS — E785 Hyperlipidemia, unspecified: Secondary | ICD-10-CM | POA: Diagnosis not present

## 2014-08-04 DIAGNOSIS — I69354 Hemiplegia and hemiparesis following cerebral infarction affecting left non-dominant side: Secondary | ICD-10-CM | POA: Insufficient documentation

## 2014-08-04 DIAGNOSIS — IMO0002 Reserved for concepts with insufficient information to code with codable children: Secondary | ICD-10-CM

## 2014-08-04 DIAGNOSIS — R414 Neurologic neglect syndrome: Secondary | ICD-10-CM

## 2014-08-04 NOTE — Therapy (Signed)
[facility] [address] [place], [place], [address] Phone: [phone]   Fax:  [phone]  Speech Language Pathology Evaluation  Patient Details  Name: Samuel Shaw MRN: [id number] Date of Birth: 07/24/1960 Referring Provider:  Charlett Blake, MD  Encounter Date: 08/04/2014      End of Session - 08/04/14 1650    Visit Number 1   Number of Visits 24   Date for SLP Re-Evaluation 11/01/14   SLP Start Time 1450   SLP Stop Time  1530   SLP Time Calculation (min) 40 min   Activity Tolerance Treatment limited secondary to agitation      Past Medical History  Diagnosis Date  . Stroke     No past surgical history on file.  There were no vitals taken for this visit.  Visit Diagnosis: Cognitive communication deficit      Subjective Assessment - 08/04/14 1513    Symptoms "She said I had trouble with remembering - I don't see it."          SLP Evaluation [facility] - 08/04/14 1515    Pain Assessment   Pain Score 0-No pain   General Information   HPI Pt with CVA 06-25-14 while visiting family. Worsening symptoms early January 2016. Discharged from [place] 07-29-14.CIR SLP working on awareness and memory goals.   Behavioral/Cognition 'all don't know what's wrong with me" Denied any cognitive deficits.   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)   Cognition   Overall Cognitive Status Impaired/Different from baseline   Area of Impairment Memory;Awareness;Safety/judgement   Selective Attention --   Memory Impaired   Awareness Impaired   Awareness Impairment Intellectual impairment;Emergent impairment;Anticipatory impairment   Behaviors Impulsive;Verbal agitation;Poor frustration tolerance   Auditory Comprehension   Overall Auditory Comprehension Appears within  functional limits for tasks assessed   Reading Comprehension   Interfering Components Left neglect/inattention;Visual scanning   Effective Techniques Verbal cueing;Visual cueing   Verbal Expression   Overall Verbal Expression Appears within functional limits for tasks assessed   Standardized Assessments   Standardized Assessments  Montreal Cognitive Assessment (MOCA)   Montreal Cognitive Assessment (MOCA)  18/30   Individuals Consulted   Consulted and Agree with Results and Recommendations Family member/caregiver  pt agitated,didn't agree to ST after eval. Wife talk to pt   Family Member Consulted  wife             SLP Education - 08/04/14 1649    Education provided Yes   Education Details cognitive-linguistic deficits   Person(s) Educated Patient   Methods Explanation;Demonstration   Comprehension Verbal cues required;Need further instruction          SLP Short Term Goals - 08/04/14 1653    SLP SHORT TERM GOAL #1   Title pt to tell SLP 3 cognitive-linguistic deficits   Time 4   Period Weeks   Status New   SLP SHORT TERM GOAL #2   Title pt to demo emergent awareness with rare min A   Time 4   Period Weeks   Status New   SLP SHORT TERM GOAL #3   Title pt to tell SLP four memory strategies with modified independence   Time 4   Period Weeks   Status New   SLP SHORT TERM GOAL #4   Title pt arrive with a  planner/organizer to therapy for 3 sessions, in order to improve/compensate for decr'd prospective and retrospective memory   Time 4   Period Weeks   Status New          SLP Long Term Goals - 08/04/14 1657    SLP LONG TERM GOAL #1   Title pt will demo verbal anticipatory awareness in conversation, in each of 3 different situations relating to pt deficits   Time 12   Period Weeks   Status New   SLP LONG TERM GOAL #2   Title pt will utilize memory compensations x3 in 4 therapy sessions   Time 12   Period Weeks   Status New          Plan - 08/04/14  1651    Clinical Impression Statement Pt presents with mod cognitive-linguistic deficits in the areas of awareness and memory complicated by left neglect/inattention. Pt did not admit any cognitive-linguistic deficits to SLP today despite SLP pointing out obvious errors with clock drawing and trail making tasks.    Speech Therapy Frequency 2x / week   Duration --  12 weeks   Treatment/Interventions Cueing hierarchy;Functional tasks;Patient/family education;Compensatory strategies;SLP instruction and feedback;Cognitive reorganization   Potential to Achieve Goals Good   Potential Considerations Cooperation/participation level        Problem List Patient Active Problem List   Diagnosis Date Noted  . Hemi-neglect of left side 07/16/2014  . Spastic hemiplegia affecting nondominant side 07/10/2014  . Cognitive deficit, post-stroke 07/10/2014  . Sinus bradycardia on ECG 07/10/2014  . 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 CVA (cerebral vascular accident)   . Tobacco abuse   . HLD (hyperlipidemia)   . CVA (cerebral infarction) 07/06/2014    [facility], SLP 08/04/2014, 5:01 PM  [facility] [address] [place], [place], [address] Phone: [phone]   Fax:  [phone]

## 2014-08-04 NOTE — Therapy (Signed)
[facility] [address] [place], [place], [address] Phone: [phone]   Fax:  [phone]  Physical Therapy Evaluation  Patient Details  Name: Samuel Shaw MRN: [id number] Date of Birth: Oct 16, 1960 Referring Provider:  Charlett Blake, MD  Encounter Date: 08/04/2014      PT End of Session - 08/04/14 1619    Visit Number 1   Number of Visits 17   Date for PT Re-Evaluation 10/03/14   PT Start Time 3818   PT Stop Time 2993   PT Time Calculation (min) 38 min      Past Medical History  Diagnosis Date  . Stroke     History reviewed. No pertinent past surgical history.  There were no vitals taken for this visit.  Visit Diagnosis:  Lack of coordination due to stroke - Plan: PT plan of care cert/re-cert  Difficulty walking - Plan: PT plan of care cert/re-cert  Left hemiparesis - Plan: PT plan of care cert/re-cert      Subjective Assessment - 08/04/14 1543    Symptoms Pt had a R CVA on 06/25/14 while he was in [place]. After d/c from Acute care in [place], he completed inpatient rehab at [facility]. He is now using a large based quad cane to ambulate and  reports  difficulty with balance, walking and L sided weakness. He reports that he doesn't know if his eye sight was affected by the stroke.  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 He is also a poor historian and per speech therapist has some poor insight/awareness of deficits.   Patient Stated Goals To ambulate on his own without an assisive device.          [facility] PT Assessment - 08/04/14 0001    Assessment   Medical Diagnosis R CVA   Onset Date 06/25/14   Prior Therapy Inpatient rehab [facility]   Precautions   Precautions Fall  left inattention   Required Braces or Orthoses --  has a left walk on AFO   Balance Screen   Has the patient fallen in the past 6 months No   Has the patient had a decrease in activity  level because of a fear of falling?  No   Is the patient reluctant to leave their home because of a fear of falling?  No   Home Environment   Living Enviornment Private residence   Living Arrangements Spouse/significant other;Children  with 2 adult daughters   Available Help at Discharge Family   Type of [place] to enter   Entrance Stairs-Number of Steps [place] - quad;Tub bench;Wheelchair - manual  WC is rental   Prior Function   Level of Independence Independent with basic ADLs;Independent with gait;Independent with transfers   Vocation Full time employment   Vocation Requirements heavy Company secretary (bulldozer)   Leisure playing horse-shoes and basketball   Cognition   Overall Cognitive Status Impaired/Different from baseline   Observation/Other Assessments   Focus on Therapeutic Outcomes (FOTO)  Functional Status 45   Other Surveys  Select   Stroke Impact Scale  ,mobility 55.6%   Coordination   Gross Motor Movements are Fluid and Coordinated No   Heel Shin Test impaired. Unable to complete with LLE due to weakness and discontinuous movement   Transfers   Sit to Stand 5: Supervision  without UE push off  Ambulation/Gait   Ambulation/Gait Yes   Ambulation/Gait Assistance 4: Min guard   Ambulation Distance (Feet) 100 Feet   Assistive device Large base quad cane   Gait Pattern Decreased step length - right;Decreased dorsiflexion - left;Left flexed knee in stance;Wide base of support;Poor foot clearance - left;Decreased hip/knee flexion - left  maintains left knee flexed at 20 degrees throughout gait pat   Ambulation Surface Level;Indoor   Gait velocity 0.88 ft/sec   Balance   Balance Assessed Yes   Standardized Balance Assessment   Standardized Balance Assessment Berg Balance Test;Timed Up and Go Test   Berg Balance Test   Sit to Stand Able to stand  independently using hands   Standing Unsupported  Able to stand 30 seconds unsupported  1 min 18 sec   Sitting with Back Unsupported but Feet Supported on Floor or Stool Able to sit safely and securely 2 minutes   Stand to Sit Controls descent by using hands   Transfers Able to transfer safely, definite need of hands   Standing Unsupported with Eyes Closed Able to stand 10 seconds safely   Standing Ubsupported with Feet Together Needs help to attain position but able to stand for 30 seconds with feet together   From Standing, Reach Forward with Outstretched Arm Can reach confidently >25 cm (10")   From Standing Position, Pick up Object from Floor Able to pick up shoe, needs supervision  excessive weight shift right   From Standing Position, Turn to Look Behind Over each Shoulder Turn sideways only but maintains balance   Turn 360 Degrees Needs close supervision or verbal cueing  26.75 seconds and pivots on the RLE   Standing Unsupported, Alternately Place Feet on Step/Stool Needs assistance to keep from falling or unable to try   Standing Unsupported, One Foot in Front Able to plae foot ahead of the other independently and hold 30 seconds   Standing on One Leg Tries to lift leg/unable to hold 3 seconds but remains standing independently   Total Score 34   Timed Up and Go Test   TUG Normal TUG   Normal TUG (seconds) 44.26  >13.5 indicative of falls, pt unsteady with turn                            PT Short Term Goals - 08/04/14 1628    PT SHORT TERM GOAL #1   Title Demonstrate correct performance of home exercise program to address balance, gait and strength impairments. Target: 09/04/14   PT SHORT TERM GOAL #2   Title Increase Berg balance test score to 40/56 for improving balance.  Target: 09/04/14   PT SHORT TERM GOAL #3   Title Decrease TUG time to 35 seconds for improving efficiency with functional mobility with LRAD.  Target: 09/04/14   PT SHORT TERM GOAL #4   Title Begin gait training with either a single point  cane or without an assistive device (with AFO) as appropriate.  Target: 09/04/14   PT SHORT TERM GOAL #5   Title Demonstrate ability to stand x5 minutes unsupported with upper extremity task for increased activity tolerance.  Target: 09/04/14           PT Long Term Goals - 08/04/14 1631    PT LONG TERM GOAL #1   Title Verbalize understanding of signs and risk factors of CVA.  Target: 10/03/14   PT LONG TERM GOAL #2   Title Increase Berg Balance Test  score to 47/56 for decreased fall risk.  Target: 10/03/14   PT LONG TERM GOAL #3   Title Ambulate 500' on indoor level surfaces without assistive device independently for increased independence with household ambulation. Target: 10/03/14   PT LONG TERM GOAL #4   Title Increase gait speed without assistive device to >1.8 ft/sec for decreased fall risk. Target: 10/03/14   PT LONG TERM GOAL #5   Title Perform TUG in <30 seconds without assistive device with safe turn, independently, for increased safety with household mobility. Target: 10/03/14   Additional Long Term Goals   Additional Long Term Goals Yes   PT LONG TERM GOAL #6   Title Increase FOTO stroke impact mobility scale to 70% for decreased disability due to CVA. Target: 10/03/14               Plan - 08/04/14 1621    Clinical Impression Statement Pt is a 54 year old male with R MCA CVA on 06/26/15. He has lingering deficits which include core and LLE weakness, decreased coordination, decreased balance with fall risk, decresaed attention to the left side, various gait impairments, decreased activity tolerance.   Rehab Potential Good   Clinical Impairments Affecting Rehab Potential decreased awareness of deficits   PT Frequency 2x / week   PT Duration 8 weeks   PT Treatment/Interventions ADLs/Self Care Home Management;Therapeutic activities;Patient/family education;Therapeutic exercise;DME Instruction;Gait training;Balance training;Neuromuscular re-education;Functional mobility training;Stair  training   PT Next Visit Plan Balance HEP, and LLE strength HEP, CVA ed   Consulted and Agree with Plan of Care Patient         Problem List Patient Active Problem List   Diagnosis Date Noted  . Hemi-neglect of left side 07/16/2014  . Spastic hemiplegia affecting nondominant side 07/10/2014  . Cognitive deficit, post-stroke 07/10/2014  . Sinus bradycardia on ECG 07/10/2014  . 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 CVA (cerebral vascular accident)   . Tobacco abuse   . HLD (hyperlipidemia)   . CVA (cerebral infarction) 07/06/2014    Delrae Sawyers D 08/04/2014, 4:39 PM  [facility] [address] [place], [place], [address] Phone: [phone]   Fax:  [phone]    Delrae Sawyers, PT,DPT,NCS 08/04/2014 4:39 PM Phone [phone] FAX [phone]

## 2014-08-04 NOTE — Therapy (Signed)
[facility] [address] [place], [place], [address] Phone: [phone]   Fax:  [phone]  Occupational Therapy Evaluation  Patient Details  Name: Samuel Shaw MRN: [id number] Date of Birth: December 06, 1960 Referring Provider:  Charlett Blake, MD  Encounter Date: 08/04/2014      OT End of Session - 08/04/14 1811    Visit Number 1   Number of Visits 16   Date for OT Re-Evaluation 09/29/14   Authorization Type self pay   OT Start Time 1615   OT Stop Time 1655   OT Time Calculation (min) 40 min      Past Medical History  Diagnosis Date  . Stroke     History reviewed. No pertinent past surgical history.  There were no vitals taken for this visit.  Visit Diagnosis:  Hemiplegia affecting left nondominant side - Plan: Ot plan of care cert/re-cert  Impaired cognition - Plan: Ot plan of care cert/re-cert  Left-sided neglect - Plan: Ot plan of care cert/re-cert  Impaired functional mobility and activity tolerance - Plan: Ot plan of care cert/re-cert      Subjective Assessment - 08/04/14 1618    Symptoms I am here so I can get better from the stroke   Pertinent History see epic snapshot   Currently in Pain? No/denies          [facility] OT Assessment - 08/04/14 1620    Assessment   Diagnosis R CVA   Onset Date 06/25/14   Prior Therapy inpatient rehab d/c 07/29/2014   Precautions   Precautions None   Restrictions   Weight Bearing Restrictions No   Balance Screen   Has the patient fallen in the past 6 months No   Home  Environment   Family/patient expects to be discharged to: Private residence   Living Arrangements Spouse/significant other  and one daughter 61   Available Help at Discharge Available 24 hours/day   Type of [place] One level   Bathroom Shower/Tub Tub/Shower unit   Additional Comments Pt has transfer tub bench, 3 in    Prior Function   Level of  Independence Independent with basic ADLs;Independent with homemaking with ambulation   Vocation Full time employment   Multimedia programmer   ADL   Eating/Feeding Minimal assistance  assist for cutting   Grooming Minimal assistance  brother in law uses clippers on beard   Upper Body Bathing Supervision/safety   Lower Body Bathing Minimal assistance  for balance when he stands to wash bottom   Upper Body Dressing Minimal assistance  buttons, zippers   Lower Body Dressing Minimal assistance  min a for shoes, brace, tying and balance   Toilet Tranfer Supervision/safety   Toileting - Clothing Manipulation Modified independent   [place]   Tub/Shower Transfer Supervision/safety   IADL   Light Housekeeping Does not participate in any housekeeping tasks   Meal Prep Needs to have meals prepared and served  did grill cooking before   [employer] on family or friends for transportation   Medication Management Is not capable of dispensing or managing own medication   Financial Management Dependent   Mobility   Mobility Status Needs assist   Mobility Status Comments uses quad cane in house and outside   Written Expression   Dominant Hand Right   Vision Assessment   Eye Alignment Impaired (comment)   Ocular Range  of Motion Within Functional Limits  pt gets a little dizzy with testing resolves quickly   Tracking/Visual Pursuits Able to track stimulus in all quads without difficulty   Activity Tolerance   Activity Tolerance Tolerate 30+ min activity without fatigue   Cognition   Attention Selective   Selective Attention Impaired   Memory --  to be further assessed   Awareness Impaired   Problem Solving Impaired  to be further assessed - pt very defensive   Sensation   Light Touch Appears Intact   Hot/Cold Appears Intact   Proprioception Appears Intact   Coordination   Box and Blocks L= 33   Other 9 hole peg L=  1.00.78   Tone   Assessment Location Left Upper Extremity   AROM   Overall AROM  Deficits   Overall AROM Comments shoulder flexion to about 150 degrees, and supination limited to -20 degrees. Pt also limited in wrist extension    Strength   Overall Strength Deficits   Overall Strength Comments proximal shoulder strength limited 3+/5   Hand Function   Right Hand Gross Grasp Functional   Right Hand Grip (lbs) 135 pounds   Left Hand Gross Grasp Impaired   Left Hand Grip (lbs) 90 pounds   LUE Tone   LUE Tone Mild                         OT Short Term Goals - 08/04/14 1815    OT SHORT TERM GOAL #1   Title Pt will be mod I with HEP - 09/01/2014   Status New   OT SHORT TERM GOAL #2   Title Pt will be mod I with cutting with AE prn   Status New   OT SHORT TERM GOAL #3   Title Pt will be mod I with bathing   Status New   OT SHORT TERM GOAL #4   Title Pt will be mod with toilet transfers   Status New   OT SHORT TERM GOAL #5   Title Pt will be mod I with shower transfers   Status New   OT SHORT TERM GOAL #6   Title Pt will be able to reach for 3 pound object on shelf with LUE at mid level   Status New   OT SHORT TERM GOAL #7   Title Pt will require min a for basic organization for meal prep   Status New           OT Long Term Goals - 08/04/14 1818    OT LONG TERM GOAL #1   Title Pt will be mod I with upgraded HEP - 09/29/2014   Status New   OT LONG TERM GOAL #2   Title Pt will be able to reach at high reach level to obtain 3 pound object   Status New   OT LONG TERM GOAL #3   Title Pt will demonstrate improved coodination as evidenced by decreasing time on 9 hole peg test by 10 seconds to assist with functional tasks (baseline= 1.00.78)   Status New   OT LONG TERM GOAL #4   Title Pt will improve in functional use of LUE as evidenced by increasing box and blocks by at least 5 blocks (baseline= 33)   Status New   OT LONG TERM GOAL #5   Title Pt will be mod  I for simple meal prep at ambulatory level   Status New  Plan - 08/04/14 1811    Clinical Impression Statement Pt is a 54 year old male s/p R CVA with history of multiple strokes. Pt presents today with the following deficits that impede his ability to complete ADL's and IADL's:  L hemiplegia (pt is right handed), decrease AROM, decreased strength, decreased coordination,decreased functional use of LUE, mildly increased tone, decreased balance and functional mobility, decreased cognition, L neglect.   Rehab Potential Good   Clinical Impairments Affecting Rehab Potential Pt can benefit from skilled OT to address the above deficits   OT Frequency 2x / week   OT Duration 8 weeks   OT Treatment/Interventions Self-care/ADL training;Moist Heat;Therapeutic exercise;Neuromuscular education;DME and/or AE instruction;Manual Therapy;Functional Mobility Training;Passive range of motion;Therapeutic activities;Cognitive remediation/compensation;Visual/perceptual remediation/compensation;Patient/family education;Balance training   Plan Intiate HEP   Consulted and Agree with Plan of Care Patient        Problem List Patient Active Problem List   Diagnosis Date Noted  . Hemi-neglect of left side 07/16/2014  . Spastic hemiplegia affecting nondominant side 07/10/2014  . Cognitive deficit, post-stroke 07/10/2014  . Sinus bradycardia on ECG 07/10/2014  . 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 CVA (cerebral vascular accident)   . Tobacco abuse   . HLD (hyperlipidemia)   . CVA (cerebral infarction) 07/06/2014    Quay Burow, OTR/L 08/04/2014, 6:24 PM  [facility] [address] [place], [place], [address] Phone: [phone]   Fax:  [phone]

## 2014-08-07 ENCOUNTER — Encounter (HOSPITAL_COMMUNITY): Payer: Self-pay | Admitting: *Deleted

## 2014-08-07 ENCOUNTER — Emergency Department (HOSPITAL_COMMUNITY)
Admission: EM | Admit: 2014-08-07 | Discharge: 2014-08-07 | Disposition: A | Discharge status: Home / Self Care | Payer: Medicaid Other | Attending: Emergency Medicine | Admitting: Emergency Medicine

## 2014-08-07 DIAGNOSIS — R001 Bradycardia, unspecified: Secondary | ICD-10-CM | POA: Diagnosis not present

## 2014-08-07 DIAGNOSIS — Z720 Tobacco use: Secondary | ICD-10-CM | POA: Diagnosis not present

## 2014-08-07 DIAGNOSIS — Z79899 Other long term (current) drug therapy: Secondary | ICD-10-CM | POA: Insufficient documentation

## 2014-08-07 DIAGNOSIS — R519 Headache, unspecified: Secondary | ICD-10-CM

## 2014-08-07 DIAGNOSIS — Z8673 Personal history of transient ischemic attack (TIA), and cerebral infarction without residual deficits: Secondary | ICD-10-CM | POA: Insufficient documentation

## 2014-08-07 DIAGNOSIS — R51 Headache: Secondary | ICD-10-CM | POA: Insufficient documentation

## 2014-08-07 DIAGNOSIS — Z7982 Long term (current) use of aspirin: Secondary | ICD-10-CM | POA: Insufficient documentation

## 2014-08-07 DIAGNOSIS — Z8679 Personal history of other diseases of the circulatory system: Secondary | ICD-10-CM | POA: Insufficient documentation

## 2014-08-07 MED ORDER — ACETAMINOPHEN 500 MG PO TABS
1000.0000 mg | ORAL_TABLET | Freq: Once | ORAL | Status: AC
  Administered 2014-08-07: 1000 mg via ORAL
  Filled 2014-08-07: qty 2

## 2014-08-07 MED ORDER — IBUPROFEN 800 MG PO TABS
800.0000 mg | ORAL_TABLET | Freq: Once | ORAL | Status: AC
  Administered 2014-08-07: 800 mg via ORAL
  Filled 2014-08-07: qty 1

## 2014-08-07 NOTE — ED Notes (Signed)
The pt is c/o a headache and his bp is  Elevated bp.  No n v or diarrhea

## 2014-08-07 NOTE — Discharge Instructions (Signed)

## 2014-08-07 NOTE — ED Provider Notes (Signed)
CSN: [id number]     Arrival date & time 08/07/14  1941 History   First MD Initiated Contact with Patient 08/07/14 2124     Chief Complaint  Patient presents with  . Headache     (Consider location/radiation/quality/duration/timing/severity/associated sxs/prior Treatment) HPI 54 year old male with past history of recent right middle cerebral infarction last month who presents ED today for headache. Patient states while waiting in triage his headache improved. Currently rating his headache 3/10. Patient states he has history of migraines and this headache is similar to headaches he has had in the past. Patient states headache has been intermittent in the last 2 days and located in left side of his head and is described as a pinch. Patient denies any other symptoms at this time. He is not to take any medications at home for this. Patient states she also notices blood pressure was 786 systolic and this concerned him so he decided to go to the ED for further evaluation. Patient denies having any recent illness, fevers chills, new weakness, vision changes, numbness or tingling. No other complaints at this time. Past Medical History  Diagnosis Date  . Stroke    History reviewed. No pertinent past surgical history. No family history on file. History  Substance Use Topics  . Smoking status: Current Every Day Smoker  . Smokeless tobacco: Not on file  . Alcohol Use: No    Review of Systems  Constitutional: Negative for fever, activity change and appetite change.  HENT: Negative for congestion, rhinorrhea and sore throat.   Eyes: Negative for visual disturbance.  Respiratory: Negative for cough and shortness of breath.   Cardiovascular: Negative for chest pain, palpitations and leg swelling.  Gastrointestinal: Negative for nausea, vomiting, abdominal pain and diarrhea.  Genitourinary: Negative for dysuria, flank pain, decreased urine volume and difficulty urinating.  Musculoskeletal: Negative for  back pain and neck pain.  Skin: Negative for rash.  Neurological: Positive for headaches. Negative for dizziness, tremors, seizures, syncope, facial asymmetry, speech difficulty, weakness, light-headedness and numbness.  Psychiatric/Behavioral: Negative for confusion.      Allergies  Review of patient's allergies indicates no known allergies.  Home Medications   Prior to Admission medications   Medication Sig Start Date End Date Taking? Authorizing Provider  acetaminophen (TYLENOL) 325 MG tablet Take 2 tablets (650 mg total) by mouth every 6 (six) hours as needed for mild pain or headache. 07/08/14   Phillips Climes, MD  aspirin 325 MG tablet Take 1 tablet (325 mg total) by mouth daily. 07/29/14   Lavon Paganini Angiulli, PA-C  atorvastatin (LIPITOR) 20 MG tablet Take 1 tablet (20 mg total) by mouth daily at 6 PM. 07/29/14   Lavon Paganini Angiulli, PA-C  docusate sodium 100 MG CAPS Take 100 mg by mouth 2 (two) times daily. 07/08/14   Phillips Climes, MD  folic acid (FOLVITE) 767 MCG tablet Take 1 tablet (400 mcg total) by mouth daily. 07/29/14   Lavon Paganini Angiulli, PA-C  nicotine (NICODERM CQ - DOSED IN MG/24 HOURS) 14 mg/24hr patch 14 mg patch daily 3 weeks and 7 mg patch daily 3 weeks. 07/29/14   Lavon Paganini Angiulli, PA-C  tiZANidine (ZANAFLEX) 2 MG tablet Take 1 tablet (2 mg total) by mouth 3 (three) times daily. 07/29/14   Daniel J Angiulli, PA-C   BP 138/91 mmHg  Pulse 54  Temp(Src) 98.2 F (36.8 C) (Oral)  Resp 16  SpO2 98% Physical Exam  Constitutional: He is oriented to person, place, and time. He appears  well-developed and well-nourished. No distress.  HENT:  Head: Normocephalic and atraumatic.  Nose: Nose normal.  Mouth/Throat: Oropharynx is clear and moist. No oropharyngeal exudate.  Eyes: Conjunctivae and EOM are normal.  Neck: Normal range of motion. Neck supple. No JVD present.  Cardiovascular: Regular rhythm, normal heart sounds and intact distal pulses.  Bradycardia present.    Pulmonary/Chest: Effort normal and breath sounds normal. No respiratory distress.  Abdominal: Soft. He exhibits no distension. There is no tenderness. There is no rebound and no guarding.  Musculoskeletal: Normal range of motion.  Neurological: He is alert and oriented to person, place, and time. No cranial nerve deficit.  HDS, AAOx4. PERRL, EOMI, TML, face sym. CN 2-12 grossly intact. 5/5 sym, no drift, SILT, normal gait and coordination.    Skin: Skin is warm and dry. No rash noted.  Psychiatric: He has a normal mood and affect.  Nursing note and vitals reviewed.   ED Course  Procedures (including critical care time) Labs Review Labs Reviewed - No data to display  Imaging Review No results found.   EKG Interpretation None      MDM   Final diagnoses:  None     Samuel Shaw is a 54 y.o. male with H&P as above. Patient is admitted in stable and in no apparent distress in ED. Patient has a benign neuro exam. Patient states his headache is much improved without treatment. Patient was given Tylenol for his headache and states his headache is even better now. Patient remained stable in ED. His PCP follow-up next week. Pt is seeing PT for L sided deficits. Strength appears equal today. Advised him to keep PCP appointment and reviewed strict return precautions and patient voiced understanding. stable for discharge.  Clinical Impression: 1. Nonintractable headache     Disposition: Discharge  Condition: Good  I have discussed the results, Dx and Tx plan with the pt(& family if present). He/she/they expressed understanding and agree(s) with the plan. Discharge instructions discussed at great length. Strict return precautions discussed and pt &/or family have verbalized understanding of the instructions. No further questions at time of discharge.    New Prescriptions   No medications on file    Follow Up: [facility] [address] [id number] [place] [phone]  If symptoms worsen  [facility]     [address] [place]  [phone]  To establish primary care, call above     Please follow up with your PCP either as scheduled on 2/17 or sooner if they will see you sooner.   Pt seen in conjunction with Dr. Mariea Clonts, MD  Kirstie Peri, [facility] Resident - PGY-2      Kirstie Peri, MD 08/07/14 [place], MD 08/08/14 [phone]

## 2014-08-11 ENCOUNTER — Ambulatory Visit: Payer: Medicaid Other | Admitting: Physical Therapy

## 2014-08-11 ENCOUNTER — Ambulatory Visit: Payer: Medicaid Other | Admitting: Speech Pathology

## 2014-08-11 ENCOUNTER — Ambulatory Visit: Payer: Medicaid Other | Admitting: Occupational Therapy

## 2014-08-12 ENCOUNTER — Ambulatory Visit: Payer: Medicaid Other

## 2014-08-12 ENCOUNTER — Ambulatory Visit: Payer: Medicaid Other | Admitting: Occupational Therapy

## 2014-08-12 DIAGNOSIS — Z7409 Other reduced mobility: Secondary | ICD-10-CM

## 2014-08-12 DIAGNOSIS — R41841 Cognitive communication deficit: Secondary | ICD-10-CM

## 2014-08-12 DIAGNOSIS — R414 Neurologic neglect syndrome: Secondary | ICD-10-CM

## 2014-08-12 DIAGNOSIS — G8194 Hemiplegia, unspecified affecting left nondominant side: Secondary | ICD-10-CM

## 2014-08-12 DIAGNOSIS — R4189 Other symptoms and signs involving cognitive functions and awareness: Secondary | ICD-10-CM

## 2014-08-12 DIAGNOSIS — IMO0002 Reserved for concepts with insufficient information to code with codable children: Secondary | ICD-10-CM

## 2014-08-12 DIAGNOSIS — I69398 Other sequelae of cerebral infarction: Secondary | ICD-10-CM | POA: Diagnosis not present

## 2014-08-12 DIAGNOSIS — R262 Difficulty in walking, not elsewhere classified: Secondary | ICD-10-CM

## 2014-08-12 NOTE — Therapy (Signed)
[facility] [address] [place], [place], [address] Phone: [phone]   Fax:  [phone]  Speech Language Pathology Treatment  Patient Details  Name: Samuel Shaw MRN: [id number] Date of Birth: 04-22-1961 Referring Provider:  Charlett Blake, MD  Encounter Date: 08/12/2014      End of Session - 08/12/14 1639    Visit Number 2   Number of Visits 24   Date for SLP Re-Evaluation 11/01/14   SLP Start Time 1149   SLP Stop Time  1227   SLP Time Calculation (min) 38 min   Activity Tolerance --  limited by decr'd attention and awareness skills      Past Medical History  Diagnosis Date  . Stroke     No past surgical history on file.  There were no vitals taken for this visit.  Visit Diagnosis: Cognitive communication deficit      Subjective Assessment - 08/12/14 1633    Symptoms Pt with very flat affect.             ADULT SLP TREATMENT - 08/12/14 1158    General Information   Behavior/Cognition Alert;Cooperative  Flat affect   Treatment Provided   Treatment provided Cognitive-Linquistic   Pain Assessment   Pain Assessment No/denies pain   Cognitive-Linquistic Treatment   Treatment focused on Cognition   Skilled Treatment SLP targeted attention to detail with pt today, in a verbal task. Pt req'd mod A consistently to look to lt side of paper (ID items in map task). Pt did not use emergent or anticipatory awareness re: left neglect in this task, as he continually req'd cues for items on lt. SLP did not incr pt's awareness of these deficts today given pt's negative reaction to made aware of deficits during eval.   Assessment / Recommendations / Plan   Plan Continue with current plan of care   Progression Toward Goals   Progression toward goals --  Cognitive-linguistics incr's if awareness and memory improve            SLP Short Term Goals - 08/12/14 1641    SLP SHORT TERM GOAL #1   Title pt to tell SLP 3 cognitive-linguistic deficits   Time 4   Period Weeks   Status New   SLP SHORT TERM GOAL #2   Title pt to demo emergent awareness with rare min A   Time 4   Period Weeks   Status New   SLP SHORT TERM GOAL #3   Title pt to tell SLP four memory strategies with modified independence   Time 4   Period Weeks   Status New   SLP SHORT TERM GOAL #4   Title pt arrive with a planner/organizer to therapy for 3 sessions, in order to improve/compensate for decr'd prospective and retrospective memory   Time 4   Period Weeks   Status New          SLP Long Term Goals - 08/12/14 1642    SLP LONG TERM GOAL #1   Title pt will demo verbal anticipatory awareness in conversation, in each of 3 different situations relating to pt deficits   Time 12   Period Weeks   Status New   SLP LONG TERM GOAL #2   Title pt will utilize memory compensations x3 in 4 therapy sessions   Time 12   Period Weeks   Status New          Plan - 08/12/14 1640  Clinical Impression Statement Pt presents with mod cognitive-linguistic deficits in the areas of awareness and memory complicated by left neglect/inattention. Pt did not demo emergent or anticipatory awareness despite multiple errors when items on lt side.   Speech Therapy Frequency 2x / week   Duration --  12 weeks   Treatment/Interventions Cueing hierarchy;Functional tasks;Patient/family education;Compensatory strategies;SLP instruction and feedback;Cognitive reorganization   Potential to Achieve Goals Good   Potential Considerations Cooperation/participation level;Severity of impairments        Problem List Patient Active Problem List   Diagnosis Date Noted  . Hemi-neglect of left side 07/16/2014  . Spastic hemiplegia affecting nondominant side 07/10/2014  . Cognitive deficit, post-stroke 07/10/2014  . Sinus bradycardia on ECG 07/10/2014  . 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 CVA  (cerebral vascular accident)   . Tobacco abuse   . HLD (hyperlipidemia)   . CVA (cerebral infarction) 07/06/2014    [facility], SLP 08/12/2014, 4:43 PM  [facility] [address] [place], [place], [address] Phone: [phone]   Fax:  [phone]

## 2014-08-12 NOTE — Patient Instructions (Signed)
Laying on your back, hold a medium sized ball between your hands, straighten elbows and push ball towards ceiling, then bend elbows and bring ball to your chest 10 reps 2x day, if 10 reps becomes too easy progress to 20 reps  Laying on your back hold ball between your hands, keep elbows straight, raise arms overhead as far as you can comfortably, slowly lower arms, keep elbows straight, 10 reps 1-2 x day, when 10 reps becomes easy progress to 20 reps.

## 2014-08-12 NOTE — Therapy (Signed)
[facility] [address] [place], [place], [address] Phone: [phone]   Fax:  [phone]  Occupational Therapy Treatment  Patient Details  Name: Samuel Shaw MRN: [id number] Date of Birth: December 23, 1960 Referring Provider:  Charlett Blake, MD  Encounter Date: 08/12/2014      OT End of Session - 08/12/14 1052    Visit Number 2   Number of Visits 16   Date for OT Re-Evaluation 09/29/14   Authorization Type self pay   OT Start Time 1024   OT Stop Time 1056   OT Time Calculation (min) 32 min   Equipment Utilized During Treatment ball   Activity Tolerance Patient tolerated treatment well   Behavior During Therapy [facility] for tasks assessed/performed      Past Medical History  Diagnosis Date  . Stroke     No past surgical history on file.  There were no vitals taken for this visit.  Visit Diagnosis:  Lack of coordination due to stroke  Left hemiparesis  Impaired cognition  Left-sided neglect  Impaired functional mobility and activity tolerance      Subjective Assessment - 08/12/14 1051    Currently in Pain? No/denies                 OT Treatments/Exercises (OP) - 08/12/14 0001    Fine Motor Coordination   Flipping cards with LUE, min v.c. to avoid compensation   Dealing card with thumb mod difficulty, min v.c./ demo with LUE   Picking up coins mod/ max difficulty, pt slid off tabletop   Other Fine Motor Exercises rotating ball in fingertips, min v.c. mod/max difficulty   Neurological Re-education Exercises   Other Exercises 1 Supine shoulder flexion/ chest press with medium ball using bilateral UE's, min facillitation/ v.c.  Seated sh. flex with ball, min/mod facillitation LUE   Reciprocal Movements UBE x 5 mins                OT Education - 08/12/14 1052    Education provided Yes   Education Details ball exercises in supine   Person(s) Educated Patient   Methods  Explanation;Demonstration;Handout;Tactile cues;Verbal cues   Comprehension Verbalized understanding;Returned demonstration;Verbal cues required          OT Short Term Goals - 08/04/14 1815    OT SHORT TERM GOAL #1   Title Pt will be mod I with HEP - 09/01/2014   Status New   OT SHORT TERM GOAL #2   Title Pt will be mod I with cutting with AE prn   Status New   OT SHORT TERM GOAL #3   Title Pt will be mod I with bathing   Status New   OT SHORT TERM GOAL #4   Title Pt will be mod with toilet transfers   Status New   OT SHORT TERM GOAL #5   Title Pt will be mod I with shower transfers   Status New   OT SHORT TERM GOAL #6   Title Pt will be able to reach for 3 pound object on shelf with LUE at mid level   Status New   OT SHORT TERM GOAL #7   Title Pt will require min a for basic organization for meal prep   Status New           OT Long Term Goals - 08/04/14 1818    OT LONG TERM GOAL #1   Title Pt will be mod I with upgraded HEP -  09/29/2014   Status New   OT LONG TERM GOAL #2   Title Pt will be able to reach at high reach level to obtain 3 pound object   Status New   OT LONG TERM GOAL #3   Title Pt will demonstrate improved coodination as evidenced by decreasing time on 9 hole peg test by 10 seconds to assist with functional tasks (baseline= 1.00.78)   Status New   OT LONG TERM GOAL #4   Title Pt will improve in functional use of LUE as evidenced by increasing box and blocks by at least 5 blocks (baseline= 33)   Status New   OT LONG TERM GOAL #5   Title Pt will be mod I for simple meal prep at ambulatory level   Status New               Plan - 08/12/14 1339    Clinical Impression Statement Pt can benefit from skilled occupational therspy to address L hemiplegia and cognitive deficits.   Rehab Potential Good   Clinical Impairments Affecting Rehab Potential cognition, left hemiplegia   OT Frequency 2x / week   OT Duration 8 weeks   OT Treatment/Interventions  Self-care/ADL training;Moist Heat;Therapeutic exercise;Neuromuscular education;DME and/or AE instruction;Manual Therapy;Functional Mobility Training;Passive range of motion;Therapeutic activities;Cognitive remediation/compensation;Visual/perceptual remediation/compensation;Patient/family education;Balance training   Plan Reinforce ball exercises in supine, coordiantion/ functional use of LUE   OT Home Exercise Plan ball exercises in supine   Consulted and Agree with Plan of Care Patient;Family member/caregiver   Family Member Consulted wife        Problem List Patient Active Problem List   Diagnosis Date Noted  . Hemi-neglect of left side 07/16/2014  . Spastic hemiplegia affecting nondominant side 07/10/2014  . Cognitive deficit, post-stroke 07/10/2014  . Sinus bradycardia on ECG 07/10/2014  . 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 CVA (cerebral vascular accident)   . Tobacco abuse   . HLD (hyperlipidemia)   . CVA (cerebral infarction) 07/06/2014    Nocholas Damaso 08/12/2014, 1:48 PM Theone Murdoch, OTR/L [phone] Phone: [phone] 1:48 PM 08/12/2014 [facility] [address] [place], [place], [address] Phone: [phone]   Fax:  [phone]

## 2014-08-12 NOTE — Therapy (Signed)
[facility] [address] [place], [place], [address] Phone: [phone]   Fax:  [phone]  Physical Therapy Treatment  Patient Details  Name: Samuel Shaw MRN: [id number] Date of Birth: [birth year] Referring Provider:  Charlett Blake, MD  Encounter Date: 08/12/2014      PT End of Session - 08/12/14 1027    Visit Number 2   Number of Visits 17   Date for PT Re-Evaluation 10/03/14   Authorization Type self pay   PT Start Time 0945   PT Stop Time 1017   PT Time Calculation (min) 32 min      Past Medical History  Diagnosis Date  . Stroke     History reviewed. No pertinent past surgical history.  There were no vitals taken for this visit.  Visit Diagnosis:  Lack of coordination due to stroke  Difficulty walking  Left hemiparesis      Subjective Assessment - 08/12/14 0947    Symptoms We thought my appointment wasn't until 42.   Currently in Pain? No/denies       Pt was taught and performed and provided with typed HEP for balance training and muscle strengthening. See Pt instructions for details.                     PT Education - 08/12/14 1026    Education provided Yes   Education Details HEP   Person(s) Educated Patient   Methods Explanation;Demonstration;Handout;Tactile cues   Comprehension Verbalized understanding;Returned demonstration          PT Short Term Goals - 08/04/14 1628    PT SHORT TERM GOAL #1   Title Demonstrate correct performance of home exercise program to address balance, gait and strength impairments. Target: 09/04/14   PT SHORT TERM GOAL #2   Title Increase Berg balance test score to 40/56 for improving balance.  Target: 09/04/14   PT SHORT TERM GOAL #3   Title Decrease TUG time to 35 seconds for improving efficiency with functional mobility with LRAD.  Target: 09/04/14   PT SHORT TERM GOAL #4   Title Begin gait training with either a single point  cane or without an assistive device (with AFO) as appropriate.  Target: 09/04/14   PT SHORT TERM GOAL #5   Title Demonstrate ability to stand x5 minutes unsupported with upper extremity task for increased activity tolerance.  Target: 09/04/14           PT Long Term Goals - 08/04/14 1631    PT LONG TERM GOAL #1   Title Verbalize understanding of signs and risk factors of CVA.  Target: 10/03/14   PT LONG TERM GOAL #2   Title Increase Berg Balance Test score to 47/56 for decreased fall risk.  Target: 10/03/14   PT LONG TERM GOAL #3   Title Ambulate 500' on indoor level surfaces without assistive device independently for increased independence with household ambulation. Target: 10/03/14   PT LONG TERM GOAL #4   Title Increase gait speed without assistive device to >1.8 ft/sec for decreased fall risk. Target: 10/03/14   PT LONG TERM GOAL #5   Title Perform TUG in <30 seconds without assistive device with safe turn, independently, for increased safety with household mobility. Target: 10/03/14   Additional Long Term Goals   Additional Long Term Goals Yes   PT LONG TERM GOAL #6   Title Increase FOTO stroke impact mobility scale to 70% for decreased disability due to CVA.  Target: 10/03/14               Plan - 08/12/14 1028    Clinical Impression Statement Provided HEP with pt very motivated to perform it. Expected to progress toward goals.   PT Next Visit Plan bed mobility, LLE hip flexor and abductor strength, CVA ed        Problem List Patient Active Problem List   Diagnosis Date Noted  . Hemi-neglect of left side 07/16/2014  . Spastic hemiplegia affecting nondominant side 07/10/2014  . Cognitive deficit, post-stroke 07/10/2014  . Sinus bradycardia on ECG 07/10/2014  . 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 CVA (cerebral vascular accident)   . Tobacco abuse   . HLD (hyperlipidemia)   . CVA (cerebral infarction) 07/06/2014   Delrae Sawyers,  PT,DPT,NCS 08/12/2014 10:34 AM Phone [phone] FAX [phone]         [facility] [address] [place], [place], [address] Phone: [phone]   Fax:  [phone]

## 2014-08-12 NOTE — Patient Instructions (Signed)
Bridging (Single Leg)   Lie on back with feet shoulder width apart and right leg straight. Lift buttocks toward the ceiling while keeping leg straight and lifted. Hold 2 seconds. Repeat 10 times. Do 3 sets per day.  http://gt2.exer.us/358   Copyright  VHI. All rights reserved.    HIP: Hamstrings - Short Sitting   Scoot to edge of chair. Put left leg out in front of you. Keep knee straight. Lift chest and lean forward, leading with the chest until a gentle stretch is felt behind the knee.  Hold 30 seconds. 3 reps per set, 2 sets per day, 7 days per week  Copyright  VHI. All rights reserved.    Strength: One-Leg    Use counter for hand supports. Stand on the left leg. Be sure to keep the knee straight with chest out, head up, and buttocks and thigh tight. Hold 30 seconds. Perform 3x daily. Copyright  VHI. All rights reserved.    Perform all of these exercises daily with supervision!  Turning in Place: Solid Surface   Standing in place holding onto the counter, lead with head,shoulder and hip and turn slowly making quarter turns toward left. Repeat 5 times per session toward each side. Perform daily  Copyright  VHI. All rights reserved.    Backward Walk   Hold onto your counter top with one hand. Step backward with one leg. Rolling back onto foot, bring other leg backward. Session: Do 3 laps at your counter daily.Copyright  VHI. All rights reserved.    Braiding   Hold counter. Move to side: 1) cross right leg in front of left, 2) bring back leg out to side, then 3) cross right leg behind left, 4) bring left leg out to side. Continue sequence in same direction until you get to the end of the counter. Reverse sequence, moving in opposite direction. Do 3 laps at counter daily.  Copyright  VHI. All rights reserved.

## 2014-08-14 ENCOUNTER — Ambulatory Visit: Payer: Medicaid Other | Admitting: Physical Therapy

## 2014-08-14 ENCOUNTER — Encounter: Payer: Self-pay | Admitting: Physical Therapy

## 2014-08-14 DIAGNOSIS — Z7409 Other reduced mobility: Secondary | ICD-10-CM

## 2014-08-14 DIAGNOSIS — I69398 Other sequelae of cerebral infarction: Secondary | ICD-10-CM | POA: Diagnosis not present

## 2014-08-14 DIAGNOSIS — R262 Difficulty in walking, not elsewhere classified: Secondary | ICD-10-CM

## 2014-08-14 NOTE — Therapy (Signed)
[facility] [address] [place], [place], [address] Phone: [phone]   Fax:  [phone]  Physical Therapy Treatment  Patient Details  Name: Samuel Shaw MRN: [id number] Date of Birth: [birth year] Referring Provider:  Charlett Blake, MD  Encounter Date: 08/14/2014      PT End of Session - 08/14/14 1407    Visit Number 3   Number of Visits 17   Date for PT Re-Evaluation 10/03/14   Authorization Type self pay   PT Start Time 2505   PT Stop Time 1445   PT Time Calculation (min) 40 min   Equipment Utilized During Treatment Gait belt   Activity Tolerance Patient tolerated treatment well   Behavior During Therapy [facility] for tasks assessed/performed      Past Medical History  Diagnosis Date  . Stroke     History reviewed. No pertinent past surgical history.  There were no vitals taken for this visit.  Visit Diagnosis:  Difficulty walking  Impaired functional mobility and activity tolerance      Subjective Assessment - 08/14/14 1407    Symptoms No new complaints. No falls or pain to report.   Currently in Pain? No/denies   Pain Score 0-No pain      Treatment: Exercise - side lying: clamshell with 3# weight on thigh 5 sec hold x 10 bil sides; hip abduction 5 sec hold x 10 left leg only  - step ups on bottom step with left foot on step: x 10 each forward and lateral  Gait - 100 ft, 115 ft with large based quad cane and min guard assist to supervision. Cues on increased right step length and for increased left hip/knee flexion with swing phase vs circumduction.  Neuro Re-ed: - single leg stance activities: alternating forward toe taps to 4 inch box x 10 bil; alternating forward and cross toe taps to 6 inch box x 10 each bil legs. Min assist to min guard assist for balance with no UE support and cues on posture and weight shift with activity. - with feet across blue foam beam: sit/stand x 10 reps  with limited UE assist and min assist. Cues for full upright standing posture and anterior weight shift with standing.        PT Short Term Goals - 08/04/14 1628    PT SHORT TERM GOAL #1   Title Demonstrate correct performance of home exercise program to address balance, gait and strength impairments. Target: 09/04/14   PT SHORT TERM GOAL #2   Title Increase Berg balance test score to 40/56 for improving balance.  Target: 09/04/14   PT SHORT TERM GOAL #3   Title Decrease TUG time to 35 seconds for improving efficiency with functional mobility with LRAD.  Target: 09/04/14   PT SHORT TERM GOAL #4   Title Begin gait training with either a single point cane or without an assistive device (with AFO) as appropriate.  Target: 09/04/14   PT SHORT TERM GOAL #5   Title Demonstrate ability to stand x5 minutes unsupported with upper extremity task for increased activity tolerance.  Target: 09/04/14           PT Long Term Goals - 08/04/14 1631    PT LONG TERM GOAL #1   Title Verbalize understanding of signs and risk factors of CVA.  Target: 10/03/14   PT LONG TERM GOAL #2   Title Increase Berg Balance Test score to 47/56 for decreased fall risk.  Target: 10/03/14  PT LONG TERM GOAL #3   Title Ambulate 500' on indoor level surfaces without assistive device independently for increased independence with household ambulation. Target: 10/03/14   PT LONG TERM GOAL #4   Title Increase gait speed without assistive device to >1.8 ft/sec for decreased fall risk. Target: 10/03/14   PT LONG TERM GOAL #5   Title Perform TUG in <30 seconds without assistive device with safe turn, independently, for increased safety with household mobility. Target: 10/03/14   Additional Long Term Goals   Additional Long Term Goals Yes   PT LONG TERM GOAL #6   Title Increase FOTO stroke impact mobility scale to 70% for decreased disability due to CVA. Target: 10/03/14           Plan - 08/14/14 1408    Clinical Impression Statement Has  not done HEP issued on last session. Reinforced need for HEP to achieve best results with therapy. Pt verbalized understanding (was too tired yesterday and received them on day before). Plans to work on it this weekend. Worked on strengthening and balance without issues today.   Rehab Potential Good   Clinical Impairments Affecting Rehab Potential decreased awareness of deficits   PT Frequency 2x / week   PT Duration 8 weeks   PT Treatment/Interventions ADLs/Self Care Home Management;Therapeutic activities;Patient/family education;Therapeutic exercise;DME Instruction;Gait training;Balance training;Neuromuscular re-education;Functional mobility training;Stair training   PT Next Visit Plan CVA ed, balance and strengthening   Consulted and Agree with Plan of Care Patient      Problem List Patient Active Problem List   Diagnosis Date Noted  . Hemi-neglect of left side 07/16/2014  . Spastic hemiplegia affecting nondominant side 07/10/2014  . Cognitive deficit, post-stroke 07/10/2014  . Sinus bradycardia on ECG 07/10/2014  . 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 CVA (cerebral vascular accident)   . Tobacco abuse   . HLD (hyperlipidemia)   . CVA (cerebral infarction) 07/06/2014    Willow Ora 08/14/2014, 3:00 PM  Willow Ora, PTA, [facility] [address], [place], [place] [address] [phone] 08/14/2014, 3:00 PM

## 2014-08-18 ENCOUNTER — Ambulatory Visit: Payer: Medicaid Other | Admitting: Physical Therapy

## 2014-08-18 ENCOUNTER — Ambulatory Visit: Payer: Medicaid Other | Admitting: Occupational Therapy

## 2014-08-18 ENCOUNTER — Encounter: Payer: Self-pay | Admitting: Occupational Therapy

## 2014-08-18 ENCOUNTER — Ambulatory Visit: Payer: Medicaid Other | Admitting: Speech Pathology

## 2014-08-18 ENCOUNTER — Encounter: Payer: Self-pay | Admitting: Physical Therapy

## 2014-08-18 DIAGNOSIS — I69398 Other sequelae of cerebral infarction: Secondary | ICD-10-CM | POA: Diagnosis not present

## 2014-08-18 DIAGNOSIS — R41841 Cognitive communication deficit: Secondary | ICD-10-CM

## 2014-08-18 DIAGNOSIS — Z7409 Other reduced mobility: Secondary | ICD-10-CM

## 2014-08-18 DIAGNOSIS — G8194 Hemiplegia, unspecified affecting left nondominant side: Secondary | ICD-10-CM

## 2014-08-18 DIAGNOSIS — R414 Neurologic neglect syndrome: Secondary | ICD-10-CM

## 2014-08-18 DIAGNOSIS — R262 Difficulty in walking, not elsewhere classified: Secondary | ICD-10-CM

## 2014-08-18 DIAGNOSIS — IMO0002 Reserved for concepts with insufficient information to code with codable children: Secondary | ICD-10-CM

## 2014-08-18 DIAGNOSIS — R4189 Other symptoms and signs involving cognitive functions and awareness: Secondary | ICD-10-CM

## 2014-08-18 NOTE — Therapy (Signed)
[facility] [address] [place], [place], [address] Phone: [phone]   Fax:  [phone]  Speech Language Pathology Treatment  Patient Details  Name: Samuel Shaw MRN: [id number] Date of Birth: 16-Jun-1961 Referring Provider:  Charlett Blake, MD  Encounter Date: 08/18/2014      End of Session - 08/18/14 1151    Visit Number 3   Number of Visits 24   Date for SLP Re-Evaluation 11/01/14   SLP Start Time 1103   SLP Stop Time  1148   SLP Time Calculation (min) 45 min   Activity Tolerance Patient tolerated treatment well      Past Medical History  Diagnosis Date  . Stroke     No past surgical history on file.  There were no vitals taken for this visit.  Visit Diagnosis: Cognitive communication deficit      Subjective Assessment - 08/18/14 1110    Symptoms "I'm so sick of people telling me to look left"             ADULT SLP TREATMENT - 08/18/14 1113    General Information   Behavior/Cognition Alert;Cooperative   Treatment Provided   Treatment provided Cognitive-Linquistic   Pain Assessment   Pain Assessment No/denies pain   Cognitive-Linquistic Treatment   Treatment focused on Cognition   Skilled Treatment Reviwed activities for pt to do at home with his daughter and grandchild. Pt requried  consistently mod A for intellectual awareness of attention and memory impairments. Became frustrated when cogntive impairments were brought up. Pt attended to SLP on his left side with occassional min a. He located streets on map for attention to detail and left side attention with 85% accuracy, extended time and occassional min A. Mr. Bahl sustainted attention to card game and attended to left side of desk where cards were placed with 90% accuracy and occassional min A, reminders of rules and extended time. Pt and SLP generated list of cogntive activities for pt to do at home.    Assessment /  Recommendations / Plan   Plan Continue with current plan of care   Progression Toward Goals   Progression toward goals Progressing toward goals          SLP Education - 08/18/14 1150    Education provided Yes   Education Details cogntive activities that can be done at home, areas of cogntive impairment   Person(s) Educated Patient;Spouse   Methods Explanation;Demonstration;Handout   Comprehension Verbalized understanding          SLP Short Term Goals - 08/18/14 1153    SLP SHORT TERM GOAL #1   Title pt to tell SLP 3 cognitive-linguistic deficits   Time 3   Period Weeks   Status On-going   SLP SHORT TERM GOAL #2   Title pt to demo emergent awareness with rare min A   Time 3   Status On-going   SLP SHORT TERM GOAL #3   Title pt to tell SLP four memory strategies with modified independence   Time 3   Status On-going   SLP SHORT TERM GOAL #4   Title pt arrive with a planner/organizer to therapy for 3 sessions, in order to improve/compensate for decr'd prospective and retrospective memory   Time 3   Period Weeks   Status On-going          SLP Long Term Goals - 08/18/14 1154    SLP LONG TERM GOAL #1   Title pt will  demo verbal anticipatory awareness in conversation, in each of 3 different situations relating to pt deficits   Time 11   Period Weeks   Status On-going   SLP LONG TERM GOAL #2   Title pt will utilize memory compensations x3 in 4 therapy sessions   Time 11   Period Weeks   Status On-going          Plan - 08/18/14 1152    Clinical Impression Statement Pt continues to require occassional min to mod A for attention left, attention to detail and memory strategies. Conisistent mod A for intellectul awareness of deficits. Continue skilled ST to maximize functional independence   Speech Therapy Frequency 2x / week   Treatment/Interventions Cueing hierarchy;Functional tasks;Patient/family education;Compensatory strategies;SLP instruction and  feedback;Cognitive reorganization   Potential to Achieve Goals Good   Potential Considerations Cooperation/participation level;Severity of impairments        Problem List Patient Active Problem List   Diagnosis Date Noted  . Hemi-neglect of left side 07/16/2014  . Spastic hemiplegia affecting nondominant side 07/10/2014  . Cognitive deficit, post-stroke 07/10/2014  . Sinus bradycardia on ECG 07/10/2014  . 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 CVA (cerebral vascular accident)   . Tobacco abuse   . HLD (hyperlipidemia)   . CVA (cerebral infarction) 07/06/2014    Maxen Rowland, Annye Rusk, SLP 08/18/2014, 11:55 AM  [facility] [address] [place], [place], [address] Phone: [phone]   Fax:  [phone]

## 2014-08-18 NOTE — Therapy (Signed)
[facility] [address] [place], [place], [address] Phone: [phone]   Fax:  [phone]  Occupational Therapy Treatment  Patient Details  Name: Samuel Shaw MRN: [id number] Date of Birth: [birth year] Referring Provider:  Charlett Blake, MD  Encounter Date: 08/18/2014      OT End of Session - 08/18/14 1259    Visit Number 3   Number of Visits 16   Date for OT Re-Evaluation 09/29/14   Authorization Type self pay   OT Start Time 1015   OT Stop Time 1047   OT Time Calculation (min) 32 min   Activity Tolerance Treatment limited secondary to medical complications (Comment)  high BP see note      Past Medical History  Diagnosis Date  . Stroke     History reviewed. No pertinent past surgical history.  There were no vitals taken for this visit.  Visit Diagnosis:  Impaired functional mobility and activity tolerance  Lack of coordination due to stroke  Impaired cognition  Left-sided neglect  Hemiplegia affecting left nondominant side      Subjective Assessment - 08/18/14 1024    Currently in Pain? (p) No/denies                 OT Treatments/Exercises (OP) - 08/18/14 0001    ADLs   ADL Comments Reviewed all STG and LTG's with patient.  Pt in agreement.   Exercises   Exercises Shoulder   Shoulder Exercises: Supine   Other Supine Exercises Reviwed HEP and completed 5 reps of each exercise. Pt had 45 minute rest break between PT and OT sessions.  BP was elevated (BP machine at 147/111 137/103, manual pressure 148/104 with multiple rest breaks).  Therapy session terminated.  Pt has MD appointment tomorrow morning.  Suggested to wife who was present that she may want to call MD office and just inform them of elevated BP.  Wife in agreement and calling MD office at end of session. Pt and wife instructed not to do HEP until after MD sees pt tomorrow and clears him to resume exercise. PT and wife  verbalized understanding.                  OT Short Term Goals - 08/18/14 1301    OT SHORT TERM GOAL #1   Title Pt will be mod I with HEP - 09/01/2014   Status On-going   OT SHORT TERM GOAL #2   Title Pt will be mod I with cutting with AE prn   Status On-going   OT SHORT TERM GOAL #3   Title Pt will be mod I with bathing   Status On-going   OT SHORT TERM GOAL #4   Title Pt will be mod with toilet transfers   Status On-going   OT SHORT TERM GOAL #5   Title Pt will be mod I with shower transfers   Status On-going   OT SHORT TERM GOAL #6   Title Pt will be able to reach for 3 pound object on shelf with LUE at mid level   Status New   OT SHORT TERM GOAL #7   Title Pt will require min a for basic organization for meal prep   Status New           OT Long Term Goals - 08/18/14 1301    OT LONG TERM GOAL #1   Title Pt will be mod I with upgraded HEP - 09/29/2014  Status On-going   OT LONG TERM GOAL #2   Title Pt will be able to reach at high reach level to obtain 3 pound object   Status On-going   OT LONG TERM GOAL #3   Title Pt will demonstrate improved coodination as evidenced by decreasing time on 9 hole peg test by 10 seconds to assist with functional tasks (baseline= 1.00.78)   Status On-going   OT LONG TERM GOAL #4   Title Pt will improve in functional use of LUE as evidenced by increasing box and blocks by at least 5 blocks (baseline= 33)   Status On-going   OT LONG TERM GOAL #5   Title Pt will be mod I for simple meal prep at ambulatory level   Status On-going               Plan - 08/18/14 1300    Clinical Impression Statement Pt with elevated BP today therefore session terminated early - see note for details. Pt is very motivated.   Rehab Potential Good   Clinical Impairments Affecting Rehab Potential cognition, left hemiplegia   OT Frequency 2x / week   OT Duration 8 weeks   OT Treatment/Interventions Self-care/ADL training;Moist  Heat;Therapeutic exercise;Neuromuscular education;DME and/or AE instruction;Manual Therapy;Functional Mobility Training;Passive range of motion;Therapeutic activities;Cognitive remediation/compensation;Visual/perceptual remediation/compensation;Patient/family education;Balance training   Plan CHECK BP;  neuro re ed for functional reach        Problem List Patient Active Problem List   Diagnosis Date Noted  . Hemi-neglect of left side 07/16/2014  . Spastic hemiplegia affecting nondominant side 07/10/2014  . Cognitive deficit, post-stroke 07/10/2014  . Sinus bradycardia on ECG 07/10/2014  . 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 CVA (cerebral vascular accident)   . Tobacco abuse   . HLD (hyperlipidemia)   . CVA (cerebral infarction) 07/06/2014    Quay Burow, OTR/L 08/18/2014, 1:04 PM  [facility] [address] [place], [place], [address] Phone: [phone]   Fax:  [phone]

## 2014-08-18 NOTE — Patient Instructions (Signed)
  Generate a schedule of daily activities - include nap, exercises, brain activities, house chores, etc   Memory Strategies  W - Write it down  A - Associate it with something  R - Repeat it  M - Mental Image     Play the memory game  Try to remember 3-5 items on your store list without looking  Study a detailed picture in a magazine for 1 minute, then write down everything you can remember from the picture    Cognitive Activities you can do at home:   - Solitaire  - [place] (easy level)  - [place]  On your computer, tablet or phone: [email or web address] App [employer] Match Game App [employer] Chocolate Fix Sort it out Environmental consultant App Photo Quiz App MixTwo App

## 2014-08-18 NOTE — Patient Instructions (Signed)
Ischemic Stroke °A stroke (cerebrovascular accident) is the sudden death of brain tissue. It is a medical emergency. A stroke can cause permanent loss of brain function. This can cause problems with different parts of your body. A transient ischemic attack (TIA) is different because it does not cause permanent damage. A TIA is a short-lived problem of poor blood flow affecting a part of the brain. A TIA is also a serious problem because having a TIA greatly increases the chances of having a stroke. When symptoms first develop, you cannot know if the problem might be a stroke or a TIA. °CAUSES  °A stroke is caused by a decrease of oxygen supply to an area of your brain. It is usually the result of a small blood clot or collection of cholesterol or fat (plaque) that blocks blood flow in the brain. A stroke can also be caused by blocked or damaged carotid arteries.  °RISK FACTORS °· High blood pressure (hypertension). °· High cholesterol. °· Diabetes mellitus. °· Heart disease. °· The buildup of plaque in the blood vessels (peripheral artery disease or atherosclerosis). °· The buildup of plaque in the blood vessels providing blood and oxygen to the brain (carotid artery stenosis). °· An abnormal heart rhythm (atrial fibrillation). °· Obesity. °· Smoking. °· Taking oral contraceptives (especially in combination with smoking). °· Physical inactivity. °· A diet high in fats, salt (sodium), and calories. °· Alcohol use. °· Use of illegal drugs (especially cocaine and methamphetamine). °· Being African American. °· Being over the age of 55. °· Family history of stroke. °· Previous history of blood clots, stroke, TIA, or heart attack. °· Sickle cell disease. °SYMPTOMS  °These symptoms usually develop suddenly, or may be newly present upon awakening from sleep: °· Sudden weakness or numbness of the face, arm, or leg, especially on one side of the body. °· Sudden trouble walking or difficulty moving arms or legs. °· Sudden  confusion. °· Sudden personality changes. °· Trouble speaking (aphasia) or understanding. °· Difficulty swallowing. °· Sudden trouble seeing in one or both eyes. °· Double vision. °· Dizziness. °· Loss of balance or coordination. °· Sudden severe headache with no known cause. °· Trouble reading or writing. °DIAGNOSIS  °Your health care provider can often determine the presence or absence of a stroke based on your symptoms, history, and physical exam. Computed tomography (CT) of the brain is usually performed to confirm the stroke, determine causes, and determine stroke severity. Other tests may be done to find the cause of the stroke. These tests may include: °· Electrocardiography. °· Continuous heart monitoring. °· Echocardiography. °· Carotid ultrasonography. °· Magnetic resonance imaging (MRI). °· A scan of the brain circulation. °· Blood tests. °PREVENTION  °The risk of a stroke can be decreased by appropriately treating high blood pressure, high cholesterol, diabetes, heart disease, and obesity and by quitting smoking, limiting alcohol, and staying physically active. °TREATMENT  °Time is of the essence. It is important to seek treatment at the first sign of these symptoms because you may receive a medicine to dissolve the clot (thrombolytic) that cannot be given if too much time has passed since your symptoms began. Even if you do not know when your symptoms began, get treatment as soon as possible as there are other treatment options available including oxygen, intravenous (IV) fluids, and medicines to thin the blood (anticoagulants). Treatment of stroke depends on the duration, severity, and cause of your symptoms. Medicines and dietary changes may be used to address diabetes, high blood   pressure, and other risk factors. Physical, speech, and occupational therapists will assess you and work with you to improve any functions impaired by the stroke. Measures will be taken to prevent short-term and long-term  complications, including infection from breathing foreign material into the lungs (aspiration pneumonia), blood clots in the legs, bedsores, and falls. Rarely, surgery may be needed to remove large blood clots or to open up blocked arteries. °HOME CARE INSTRUCTIONS  °· Take medicines only as directed by your health care provider. Follow the directions carefully. Medicines may be used to control risk factors for a stroke. Be sure you understand all your medicine instructions. °· You may be told to take a medicine to thin the blood, such as aspirin or the anticoagulant warfarin. Warfarin needs to be taken exactly as instructed. °¨ Too much and too little warfarin are both dangerous. Too much warfarin increases the risk of bleeding. Too little warfarin continues to allow the risk for blood clots. While taking warfarin, you will need to have regular blood tests to measure your blood clotting time. These blood tests usually include both the PT and INR tests. The PT and INR results allow your health care provider to adjust your dose of warfarin. The dose can change for many reasons. It is critically important that you take warfarin exactly as prescribed, and that you have your PT and INR levels drawn exactly as directed. °¨ Many foods, especially foods high in vitamin K, can interfere with warfarin and affect the PT and INR results. Foods high in vitamin K include spinach, kale, broccoli, cabbage, collard and turnip greens, brussels sprouts, peas, cauliflower, seaweed, and parsley, as well as beef and pork liver, green tea, and soybean oil. You should eat a consistent amount of foods high in vitamin K. Avoid major changes in your diet, or notify your health care provider before changing your diet. Arrange a visit with a dietitian to answer your questions. °¨ Many medicines can interfere with warfarin and affect the PT and INR results. You must tell your health care provider about any and all medicines you take. This  includes all vitamins and supplements. Be especially cautious with aspirin and anti-inflammatory medicines. Do not take or discontinue any prescribed or over-the-counter medicine except on the advice of your health care provider or pharmacist. °¨ Warfarin can have side effects, such as excessive bruising or bleeding. You will need to hold pressure over cuts for longer than usual. Your health care provider or pharmacist will discuss other potential side effects. °¨ Avoid sports or activities that may cause injury or bleeding. °¨ Be mindful when shaving, flossing your teeth, or handling sharp objects. °¨ Alcohol can change the body's ability to handle warfarin. It is best to avoid alcoholic drinks or consume only very small amounts while taking warfarin. Notify your health care provider if you change your alcohol intake. °¨ Notify your dentist or other health care providers before procedures. °· If swallow studies have determined that your swallowing reflex is present, you should eat healthy foods. Including 5 or more servings of fruits and vegetables a day may reduce the risk of stroke. Foods may need to be a certain consistency (soft or pureed), or small bites may need to be taken in order to avoid aspirating or choking. Certain dietary changes may be advised to address high blood pressure, high cholesterol, diabetes, or obesity. °¨ Food choices that are low in sodium, saturated fat, trans fat, and cholesterol are recommended to manage high blood pressure. °¨   Food choies that are high in fiber, and low in saturated fat, trans fat, and cholesterol may control cholesterol levels. °¨ Controlling carbohydrates and sugar intake is recommended to manage diabetes. °¨ Reducing calorie intake and making food choices that are low in sodium, saturated fat, trans fat, and cholesterol are recommended to manage obesity. °· Maintain a healthy weight. °· Stay physically active. It is recommended that you get at least 30 minutes of  activity on all or most days. °· Do not use any tobacco products including cigarettes, chewing tobacco, or electronic cigarettes. °· Limit alcohol use even if you are not taking warfarin. Moderate alcohol use is considered to be: °¨ No more than 2 drinks each day for men. °¨ No more than 1 drink each day for nonpregnant women. °· Home safety. A safe home environment is important to reduce the risk of falls. Your health care provider may arrange for specialists to evaluate your home. Having grab bars in the bedroom and bathroom is often important. Your health care provider may arrange for equipment to be used at home, such as raised toilets and a seat for the shower. °· Physical, occupational, and speech therapy. Ongoing therapy may be needed to maximize your recovery after a stroke. If you have been advised to use a walker or a cane, use it at all times. Be sure to keep your therapy appointments. °· Follow all instructions for follow-up with your health care provider. This is very important. This includes any referrals, physical therapy, rehabilitation, and lab tests. Proper follow-up can prevent another stroke from occurring. °SEEK MEDICAL CARE IF: °· You have personality changes. °· You have difficulty swallowing. °· You are seeing double. °· You have dizziness. °· You have a fever. °· You have skin breakdown. °SEEK IMMEDIATE MEDICAL CARE IF:  °Any of these symptoms may represent a serious problem that is an emergency. Do not wait to see if the symptoms will go away. Get medical help right away. Call your local emergency services (911 in [place] Do not drive yourself to the hospital. °· You have sudden weakness or numbness of the face, arm, or leg, especially on one side of the body. °· You have sudden trouble walking or difficulty moving arms or legs. °· You have sudden confusion. °· You have trouble speaking (aphasia) or understanding. °· You have sudden trouble seeing in one or both eyes. °· You have a loss of  balance or coordination. °· You have a sudden, severe headache with no known cause. °· You have new chest pain or an irregular heartbeat. °· You have a partial or total loss of consciousness. °Document Released: 06/19/2005 Document Revised: 11/03/2013 Document Reviewed: 01/28/2012 °[employer]® Patient Information ©2015 [employer]. This information is not intended to replace advice given to you by your health care provider. Make sure you discuss any questions you have with your health care provider. ° °

## 2014-08-18 NOTE — Therapy (Signed)
[facility] [address] [place], [place], [address] Phone: [phone]   Fax:  [phone]  Physical Therapy Treatment  Patient Details  Name: Samuel Shaw MRN: [id number] Date of Birth: 27-Jun-1961 Referring Provider:  Charlett Blake, MD  Encounter Date: 08/18/2014      PT End of Session - 08/18/14 0853    Visit Number 4   Number of Visits 17   Date for PT Re-Evaluation 10/03/14   Authorization Type self pay   PT Start Time 0849   PT Stop Time 0928   PT Time Calculation (min) 39 min   Equipment Utilized During Treatment Gait belt   Activity Tolerance Patient tolerated treatment well   Behavior During Therapy [facility] for tasks assessed/performed      Past Medical History  Diagnosis Date  . Stroke     History reviewed. No pertinent past surgical history.  There were no vitals taken for this visit.  Visit Diagnosis:  Difficulty walking  Impaired functional mobility and activity tolerance      Subjective Assessment - 08/18/14 0852    Symptoms No new complaints. No falls or pain to report.   Currently in Pain? No/denies   Pain Score 0-No pain     Treatment: Gait - 220 ft with large base quad cane and supervision. Cues on posture, increased step/stride length.  - 115 ft x 2 with single point cane and min guard assist. Cues on posture, cane placement and step/stride length.  Neuro Re-ed: - single leg stance activities with 6 inch box: alternating forward toe taps and cross toe taps x 10 each bil sides with up to min assist for balance.  -sit/stand with right foot on 4 inch box with cues on anterior weight shifting to assist with standing, for full upright posture with each stand and with min assist at times to control descent with sitting down.  - at counter with right UE support- high knee marches forward/backward and tandem walk forward/backward x 3 laps each/each way  Self Care: Educated pt  and spouse on risk factors and warning signs of CVA. Handout with information issued.          PT Education - 08/18/14 1425    Education provided Yes   Education Details CVA ed   [employer]) Educated Spouse;Patient   Methods Explanation;Demonstration;Handout   Comprehension Verbalized understanding          PT Short Term Goals - 08/04/14 1628    PT SHORT TERM GOAL #1   Title Demonstrate correct performance of home exercise program to address balance, gait and strength impairments. Target: 09/04/14   PT SHORT TERM GOAL #2   Title Increase Berg balance test score to 40/56 for improving balance.  Target: 09/04/14   PT SHORT TERM GOAL #3   Title Decrease TUG time to 35 seconds for improving efficiency with functional mobility with LRAD.  Target: 09/04/14   PT SHORT TERM GOAL #4   Title Begin gait training with either a single point cane or without an assistive device (with AFO) as appropriate.  Target: 09/04/14   PT SHORT TERM GOAL #5   Title Demonstrate ability to stand x5 minutes unsupported with upper extremity task for increased activity tolerance.  Target: 09/04/14           PT Long Term Goals - 08/04/14 1631    PT LONG TERM GOAL #1   Title Verbalize understanding of signs and risk factors of CVA.  Target:  10/03/14   PT LONG TERM GOAL #2   Title Increase Berg Balance Test score to 47/56 for decreased fall risk.  Target: 10/03/14   PT LONG TERM GOAL #3   Title Ambulate 500' on indoor level surfaces without assistive device independently for increased independence with household ambulation. Target: 10/03/14   PT LONG TERM GOAL #4   Title Increase gait speed without assistive device to >1.8 ft/sec for decreased fall risk. Target: 10/03/14   PT LONG TERM GOAL #5   Title Perform TUG in <30 seconds without assistive device with safe turn, independently, for increased safety with household mobility. Target: 10/03/14   Additional Long Term Goals   Additional Long Term Goals Yes   PT LONG TERM GOAL  #6   Title Increase FOTO stroke impact mobility scale to 70% for decreased disability due to CVA. Target: 10/03/14               Plan - 08/18/14 0853    Clinical Impression Statement Pt with steady balance with use of single point cane today with gait. Will work with this more at next session. CVA ed provided to pt and spouse. After session pt's spouse returned to ask if i could check his BP. I was not able to due to with my next pt already. Did notifiy the OT who would be seeing him (pt had a 45 break between sessions) and she said she would check it during her session.  When she did it was found to be elevated even after a rest in lobby. Pt/spouse to follow up with his MD.                                       Rehab Potential Good   Clinical Impairments Affecting Rehab Potential decreased awareness of deficits   PT Frequency 2x / week   PT Duration 8 weeks   PT Treatment/Interventions ADLs/Self Care Home Management;Therapeutic activities;Patient/family education;Therapeutic exercise;DME Instruction;Gait training;Balance training;Neuromuscular re-education;Functional mobility training;Stair training   PT Next Visit Plan check BP; balance and strengthening, gait with staight cane   Consulted and Agree with Plan of Care Patient        Problem List Patient Active Problem List   Diagnosis Date Noted  . Hemi-neglect of left side 07/16/2014  . Spastic hemiplegia affecting nondominant side 07/10/2014  . Cognitive deficit, post-stroke 07/10/2014  . Sinus bradycardia on ECG 07/10/2014  . 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 CVA (cerebral vascular accident)   . Tobacco abuse   . HLD (hyperlipidemia)   . CVA (cerebral infarction) 07/06/2014    Willow Ora 08/18/2014, 2:32 PM  Willow Ora, PTA, [facility] [address], [place],  [address] [phone] 08/18/2014, 2:32 PM

## 2014-08-20 ENCOUNTER — Ambulatory Visit: Payer: Medicaid Other

## 2014-08-20 ENCOUNTER — Ambulatory Visit: Payer: Medicaid Other | Admitting: Physical Therapy

## 2014-08-20 ENCOUNTER — Ambulatory Visit: Payer: Medicaid Other | Admitting: Occupational Therapy

## 2014-08-20 ENCOUNTER — Encounter: Payer: Self-pay | Admitting: Occupational Therapy

## 2014-08-20 VITALS — BP 142/90

## 2014-08-20 DIAGNOSIS — R414 Neurologic neglect syndrome: Secondary | ICD-10-CM

## 2014-08-20 DIAGNOSIS — R4189 Other symptoms and signs involving cognitive functions and awareness: Secondary | ICD-10-CM

## 2014-08-20 DIAGNOSIS — G8194 Hemiplegia, unspecified affecting left nondominant side: Secondary | ICD-10-CM

## 2014-08-20 DIAGNOSIS — IMO0002 Reserved for concepts with insufficient information to code with codable children: Secondary | ICD-10-CM

## 2014-08-20 DIAGNOSIS — I69398 Other sequelae of cerebral infarction: Secondary | ICD-10-CM | POA: Diagnosis not present

## 2014-08-20 NOTE — Therapy (Signed)
[facility] [address] [place], [place], [address] Phone: [phone]   Fax:  [phone]  Occupational Therapy Treatment  Patient Details  Name: Samuel Shaw MRN: [id number] Date of Birth: [birth year] Referring Provider:  Charlett Blake, MD  Encounter Date: 08/20/2014      OT End of Session - 08/20/14 1707    Visit Number 4   Number of Visits 16   Date for OT Re-Evaluation 09/29/14   Authorization Type self pay   OT Start Time 1615   OT Stop Time 1658   OT Time Calculation (min) 43 min   Activity Tolerance Patient tolerated treatment well      Past Medical History  Diagnosis Date  . Stroke     History reviewed. No pertinent past surgical history.  BP 142/90 mmHg  Visit Diagnosis:  Lack of coordination due to stroke  Impaired cognition  Left-sided neglect  Hemiplegia affecting left nondominant side      Subjective Assessment - 08/20/14 1624    Currently in Pain? No/denies                 OT Treatments/Exercises (OP) - 08/20/14 0001    Neurological Re-education Exercises   Other Exercises 1 Neuro re ed in sitting and standing intially with UE ranger (to high reach), then with ball, then free reach .  Pt in standing with RLE on block to increase weight on LLE and improve alignment; also addressed trunk control relative to reach in sitting and standing. Pt able to tolerate resistance in overhead reach when in good alignment. Pt BP at start of session 147/90, progressed to 155/99 by end of session. Wife and pt aware.  Pt saw MD yesterday and MD also aware of elevating BP issue.                  OT Short Term Goals - 08/20/14 1709    OT SHORT TERM GOAL #1   Title Pt will be mod I with HEP - 09/01/2014   Status On-going   OT SHORT TERM GOAL #2   Title Pt will be mod I with cutting with AE prn   Status On-going   OT SHORT TERM GOAL #3   Title Pt will be mod I with bathing    Status On-going   OT SHORT TERM GOAL #4   Title Pt will be mod with toilet transfers   Status On-going   OT SHORT TERM GOAL #5   Title Pt will be mod I with shower transfers   Status On-going   OT SHORT TERM GOAL #6   Title Pt will be able to reach for 3 pound object on shelf with LUE at mid level   Status New   OT SHORT TERM GOAL #7   Title Pt will require min a for basic organization for meal prep   Status New           OT Long Term Goals - 08/20/14 1709    OT LONG TERM GOAL #1   Title Pt will be mod I with upgraded HEP - 09/29/2014   Status On-going   OT LONG TERM GOAL #2   Title Pt will be able to reach at high reach level to obtain 3 pound object   Status On-going   OT LONG TERM GOAL #3   Title Pt will demonstrate improved coodination as evidenced by decreasing time on 9 hole peg test by 10 seconds to  assist with functional tasks (baseline= 1.00.78)   Status On-going   OT LONG TERM GOAL #4   Title Pt will improve in functional use of LUE as evidenced by increasing box and blocks by at least 5 blocks (baseline= 33)   Status On-going   OT LONG TERM GOAL #5   Title Pt will be mod I for simple meal prep at ambulatory level   Status On-going               Plan - 08/20/14 1708    Clinical Impression Statement Pt making good gains with LUE control despite elevating BP issues. MD aware.   Rehab Potential Good   Clinical Impairments Affecting Rehab Potential cognition, left hemiplegia   OT Frequency 2x / week   OT Duration 8 weeks   OT Treatment/Interventions Self-care/ADL training;Moist Heat;Therapeutic exercise;Neuromuscular education;DME and/or AE instruction;Manual Therapy;Functional Mobility Training;Passive range of motion;Therapeutic activities;Cognitive remediation/compensation;Visual/perceptual remediation/compensation;Patient/family education;Balance training   Plan CHECK BP, neuro re ed for functional reach, balance.   Consulted and Agree with Plan of  Care Patient;Family member/caregiver   Family Member Consulted wife        Problem List Patient Active Problem List   Diagnosis Date Noted  . Hemi-neglect of left side 07/16/2014  . Spastic hemiplegia affecting nondominant side 07/10/2014  . Cognitive deficit, post-stroke 07/10/2014  . Sinus bradycardia on ECG 07/10/2014  . 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 CVA (cerebral vascular accident)   . Tobacco abuse   . HLD (hyperlipidemia)   . CVA (cerebral infarction) 07/06/2014    Quay Burow, OTR/L 08/20/2014, 5:10 PM  [facility] [address] [place], [place], [address] Phone: [phone]   Fax:  [phone]

## 2014-08-24 ENCOUNTER — Ambulatory Visit: Payer: Medicaid Other | Admitting: Occupational Therapy

## 2014-08-24 ENCOUNTER — Ambulatory Visit: Payer: Medicaid Other

## 2014-08-24 ENCOUNTER — Encounter: Payer: Self-pay | Admitting: Occupational Therapy

## 2014-08-24 ENCOUNTER — Encounter: Payer: Self-pay | Admitting: Physical Therapy

## 2014-08-24 ENCOUNTER — Ambulatory Visit: Payer: Medicaid Other | Admitting: Physical Therapy

## 2014-08-24 VITALS — BP 143/87

## 2014-08-24 DIAGNOSIS — IMO0002 Reserved for concepts with insufficient information to code with codable children: Secondary | ICD-10-CM

## 2014-08-24 DIAGNOSIS — Z7409 Other reduced mobility: Secondary | ICD-10-CM

## 2014-08-24 DIAGNOSIS — R262 Difficulty in walking, not elsewhere classified: Secondary | ICD-10-CM

## 2014-08-24 DIAGNOSIS — I69398 Other sequelae of cerebral infarction: Secondary | ICD-10-CM | POA: Diagnosis not present

## 2014-08-24 DIAGNOSIS — R41841 Cognitive communication deficit: Secondary | ICD-10-CM

## 2014-08-24 DIAGNOSIS — R414 Neurologic neglect syndrome: Secondary | ICD-10-CM

## 2014-08-24 NOTE — Patient Instructions (Signed)
  Coordination Activities  Perform the following activities for 15-20 minutes 1-2 times per day with left hand(s).   Rotate ball in fingertips (clockwise and counter-clockwise).  Toss ball between hands.  Toss ball in air and catch with the same hand.  Flip cards 1 at a time as fast as you can.  Deal cards with your thumb (Hold deck in hand and push card off top with thumb).  Shuffle cards.  Pick up coins, buttons, marbles, dried beans/pasta of different sizes and place in container.  Pick up coins and place in container or coin bank.  Pick up coins and stack.  Screw together nuts and bolts, then unfasten.

## 2014-08-24 NOTE — Patient Instructions (Signed)
Continue to complete homework as directed.

## 2014-08-24 NOTE — Therapy (Signed)
[facility] [address] [place], [place], [address] Phone: [phone]   Fax:  [phone]  Physical Therapy Treatment  Patient Details  Name: Samuel Shaw MRN: [id number] Date of Birth: [birth year] Referring Provider:  Charlett Blake, MD  Encounter Date: 08/24/2014      PT End of Session - 08/24/14 1405    Visit Number 5   Number of Visits 17   Date for PT Re-Evaluation 10/03/14   Authorization Type self pay   PT Start Time 1402   PT Stop Time 1445   PT Time Calculation (min) 43 min   Equipment Utilized During Treatment Gait belt   Activity Tolerance Patient tolerated treatment well   Behavior During Therapy [facility] for tasks assessed/performed      Past Medical History  Diagnosis Date  . Stroke     History reviewed. No pertinent past surgical history.  There were no vitals taken for this visit.  Visit Diagnosis:  Difficulty walking  Impaired functional mobility and activity tolerance      Subjective Assessment - 08/24/14 1404    Symptoms No new complaints. No falls or pain to report   Currently in Pain? No/denies   Pain Score 0-No pain     Treatment BP before session: 135/100 right arm auto machine, recheck manually 138/96 right arm.   Gait: with single point cane - 230 feet x 1 with min guard assist. Cues on posture and for equal step/stride length.  - 115 feet x 1 with min guard assist to supervision, plus ramp/curb. Cues on posture and sequence/technique on ramp/curb. No loss of balance noted with any gait today.  Neuro Re-ed At counter - high knee marches forward/backwards, tandem gait forward/backward and side stepping left/right x 3 laps each/each way with min guard assist to min assist for balance and occasional UE support on counter for balance/.   Exercise - Step up using bottom step of stair: x 10 each forward and lateral with left foot on step and light UE support. Cues on ex  form.  - supine on mat with left leg bent at edge: hip flexion to lift leg off mat to floor and back up onto mat with knee flexion for hip strengthening and hip flexor stretching. 2 sets of 10 reps. Assist needed to maintain knee flexion with this exercise.  - sit/stand x 10 reps with minimal UE assist and staggered feet stance with right foot forward to promote increased left leg work. Min guard assist and cues for full upright posture with each stand.        PT Short Term Goals - 08/04/14 1628    PT SHORT TERM GOAL #1   Title Demonstrate correct performance of home exercise program to address balance, gait and strength impairments. Target: 09/04/14   PT SHORT TERM GOAL #2   Title Increase Berg balance test score to 40/56 for improving balance.  Target: 09/04/14   PT SHORT TERM GOAL #3   Title Decrease TUG time to 35 seconds for improving efficiency with functional mobility with LRAD.  Target: 09/04/14   PT SHORT TERM GOAL #4   Title Begin gait training with either a single point cane or without an assistive device (with AFO) as appropriate.  Target: 09/04/14   PT SHORT TERM GOAL #5   Title Demonstrate ability to stand x5 minutes unsupported with upper extremity task for increased activity tolerance.  Target: 09/04/14  PT Long Term Goals - 08/04/14 1631    PT LONG TERM GOAL #1   Title Verbalize understanding of signs and risk factors of CVA.  Target: 10/03/14   PT LONG TERM GOAL #2   Title Increase Berg Balance Test score to 47/56 for decreased fall risk.  Target: 10/03/14   PT LONG TERM GOAL #3   Title Ambulate 500' on indoor level surfaces without assistive device independently for increased independence with household ambulation. Target: 10/03/14   PT LONG TERM GOAL #4   Title Increase gait speed without assistive device to >1.8 ft/sec for decreased fall risk. Target: 10/03/14   PT LONG TERM GOAL #5   Title Perform TUG in <30 seconds without assistive device with safe turn,  independently, for increased safety with household mobility. Target: 10/03/14   Additional Long Term Goals   Additional Long Term Goals Yes   PT LONG TERM GOAL #6   Title Increase FOTO stroke impact mobility scale to 70% for decreased disability due to CVA. Target: 10/03/14           Plan - 08/24/14 1405    Clinical Impression Statement Pt continues to be steady with the single point cane on indoor surfaces. Okayed pt to use his inside at this time. Will trail single point cane on outdoor/unlevel surfaces as weather permits before okaying pt to use his out of home. Pt verbalized understanding. Pt making steady progress toward his goals.                                                Rehab Potential Good   Clinical Impairments Affecting Rehab Potential decreased awareness of deficits   PT Frequency 2x / week   PT Duration 8 weeks   PT Treatment/Interventions ADLs/Self Care Home Management;Therapeutic activities;Patient/family education;Therapeutic exercise;DME Instruction;Gait training;Balance training;Neuromuscular re-education;Functional mobility training;Stair training   PT Next Visit Plan check BP; balance and strengthening, gait with staight cane   Consulted and Agree with Plan of Care Patient      Problem List Patient Active Problem List   Diagnosis Date Noted  . Hemi-neglect of left side 07/16/2014  . Spastic hemiplegia affecting nondominant side 07/10/2014  . Cognitive deficit, post-stroke 07/10/2014  . Sinus bradycardia on ECG 07/10/2014  . 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 CVA (cerebral vascular accident)   . Tobacco abuse   . HLD (hyperlipidemia)   . CVA (cerebral infarction) 07/06/2014    Willow Ora 08/24/2014, 3:59 PM  Willow Ora, PTA, [place] [address], [place], [place] [address] [phone] 08/24/2014, 3:59 PM

## 2014-08-24 NOTE — Therapy (Signed)
[facility] [address] [place], [place], [address] Phone: [phone]   Fax:  [phone]  Speech Language Pathology Treatment  Patient Details  Name: Samuel Shaw MRN: [id number] Date of Birth: Jan 31, 1961 Referring Provider:  Charlett Blake, MD  Encounter Date: 08/24/2014      End of Session - 08/24/14 1104    Visit Number 4   Number of Visits 24   Date for SLP Re-Evaluation 11/01/14   SLP Start Time 1017   SLP Stop Time  60   SLP Time Calculation (min) 45 min   Activity Tolerance Patient tolerated treatment well      Past Medical History  Diagnosis Date  . Stroke     No past surgical history on file.  There were no vitals taken for this visit.  Visit Diagnosis: Cognitive communication deficit      Subjective Assessment - 08/24/14 1020    Symptoms "Same ol' same ol', I guess." (re: "how are you" from SLP)             ADULT SLP TREATMENT - 08/24/14 1020    General Information   Behavior/Cognition Alert  flat affect   Treatment Provided   Treatment provided Cognitive-Linquistic   Pain Assessment   Pain Assessment No/denies pain   Cognitive-Linquistic Treatment   Treatment focused on Cognition   Skilled Treatment Pt recalled weekend activities with rare min A.  Req'd occasional min A for abstract divergent naming tasks. Pt consistently read items on L of white board. SLP supervised solitaire  with patient to facilitate reading numbers and letters on lt side. Pt initially req'd cues to turn over face down cards, then rarely by end of task.     Assessment / Recommendations / Plan   Plan Continue with current plan of care   Progression Toward Goals   Progression toward goals Progressing toward goals            SLP Short Term Goals - 08/24/14 1107    SLP SHORT TERM GOAL #1   Title pt to tell SLP 3 cognitive-linguistic deficits   Time 2   Period Weeks   Status On-going   SLP  SHORT TERM GOAL #2   Title pt to demo emergent awareness with rare min A   Time 2   Status On-going   SLP SHORT TERM GOAL #3   Title pt to tell SLP four memory strategies with modified independence   Time 2   Status On-going   SLP SHORT TERM GOAL #4   Title pt arrive with a planner/organizer to therapy for 3 sessions, in order to improve/compensate for decr'd prospective and retrospective memory   Time 2   Period Weeks   Status On-going          SLP Long Term Goals - 08/24/14 1108    SLP LONG TERM GOAL #1   Title pt will demo verbal anticipatory awareness in conversation, in each of 3 different situations relating to pt deficits   Time 10   Period Weeks   Status On-going   SLP LONG TERM GOAL #2   Title pt will utilize memory compensations x3 in 4 therapy sessions   Time 10   Period Weeks   Status On-going          Plan - 08/24/14 1104    Clinical Impression Statement Lt attention in therapy tasks appeared better this session than in last. Pt cont with flat affect.  Speech Therapy Frequency 2x / week   Duration --  10 weeks   Treatment/Interventions Cueing hierarchy;Functional tasks;Patient/family education;Compensatory strategies;SLP instruction and feedback;Cognitive reorganization   Potential to Achieve Goals Good   Potential Considerations Cooperation/participation level;Severity of impairments        Problem List Patient Active Problem List   Diagnosis Date Noted  . Hemi-neglect of left side 07/16/2014  . Spastic hemiplegia affecting nondominant side 07/10/2014  . Cognitive deficit, post-stroke 07/10/2014  . Sinus bradycardia on ECG 07/10/2014  . 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 CVA (cerebral vascular accident)   . Tobacco abuse   . HLD (hyperlipidemia)   . CVA (cerebral infarction) 07/06/2014    [facility], SLP 08/24/2014, 11:09 AM  [facility] [address] [place], [place], [address] Phone: [phone]   Fax:  [phone]

## 2014-08-24 NOTE — Therapy (Signed)
[facility] [address] [place], [place], [address] Phone: [phone]   Fax:  [phone]  Occupational Therapy Treatment  Patient Details  Name: Samuel Shaw MRN: [id number] Date of Birth: [birth year] Referring Provider:  Charlett Blake, MD  Encounter Date: 08/24/2014      OT End of Session - 08/24/14 1402    Visit Number 5   Number of Visits 16   Date for OT Re-Evaluation 09/29/14   Authorization Type self pay   OT Start Time 1315   OT Stop Time 1358   OT Time Calculation (min) 43 min   Activity Tolerance Patient tolerated treatment well      Past Medical History  Diagnosis Date  . Stroke     History reviewed. No pertinent past surgical history.  BP 143/87 mmHg  Visit Diagnosis:  Lack of coordination due to stroke  Left-sided neglect      Subjective Assessment - 08/24/14 1324    Symptoms My doctor hasn't changed anything for my BP.   Pertinent History see epic snapshot   Currently in Pain? No/denies                 OT Treatments/Exercises (OP) - 08/24/14 0001    Fine Motor Coordination   Other Fine Motor Exercises Provided HEP for pt for fine motor activities to improve hand function, attending to L.  Pt able to return demonstrate all activities (see pt instruction).  Pt with min-moderate difficulty with tasks and required increased time.                OT Education - 08/24/14 1335    Education provided Yes   Education Details HEP for coordination   Person(s) Educated Patient   Methods Explanation;Demonstration;Verbal cues;Handout   Comprehension Verbalized understanding;Returned demonstration          OT Short Term Goals - 08/24/14 1334    OT SHORT TERM GOAL #1   Title Pt will be mod I with HEP - 09/01/2014   Status On-going   OT SHORT TERM GOAL #2   Title Pt will be mod I with cutting with AE prn   Status On-going   OT SHORT TERM GOAL #3   Title Pt will  be mod I with bathing   Status On-going   OT SHORT TERM GOAL #4   Title Pt will be mod with toilet transfers   Status On-going   OT SHORT TERM GOAL #5   Title Pt will be mod I with shower transfers   Status On-going   OT SHORT TERM GOAL #6   Title Pt will be able to reach for 3 pound object on shelf with LUE at mid level   Status New   OT SHORT TERM GOAL #7   Title Pt will require min a for basic organization for meal prep   Status New           OT Long Term Goals - 08/24/14 1334    OT LONG TERM GOAL #1   Title Pt will be mod I with upgraded HEP - 09/29/2014   Status On-going   OT LONG TERM GOAL #2   Title Pt will be able to reach at high reach level to obtain 3 pound object   Status On-going   OT LONG TERM GOAL #3   Title Pt will demonstrate improved coodination as evidenced by decreasing time on 9 hole peg test by 10 seconds to assist with functional  tasks (baseline= 1.00.78)   Status On-going   OT LONG TERM GOAL #4   Title Pt will improve in functional use of LUE as evidenced by increasing box and blocks by at least 5 blocks (baseline= 33)   Status On-going   OT LONG TERM GOAL #5   Title Pt will be mod I for simple meal prep at ambulatory level   Status On-going               Plan - 08/24/14 1338    Clinical Impression Statement Pt making good progress toward goals. BP today was 143/87.  WIll continue to monitor as MD has not make any medication changes at this time.   Rehab Potential Good   Clinical Impairments Affecting Rehab Potential cognition, left hemiplegia   OT Frequency 2x / week   OT Duration 8 weeks   OT Treatment/Interventions Self-care/ADL training;Moist Heat;Therapeutic exercise;Neuromuscular education;DME and/or AE instruction;Manual Therapy;Functional Mobility Training;Passive range of motion;Therapeutic activities;Cognitive remediation/compensation;Visual/perceptual remediation/compensation;Patient/family education;Balance training   Plan check  BP, check HEP for coordination, work on functional reach   [employer] and Agree with Plan of Care Patient        Problem List Patient Active Problem List   Diagnosis Date Noted  . Hemi-neglect of left side 07/16/2014  . Spastic hemiplegia affecting nondominant side 07/10/2014  . Cognitive deficit, post-stroke 07/10/2014  . Sinus bradycardia on ECG 07/10/2014  . 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 CVA (cerebral vascular accident)   . Tobacco abuse   . HLD (hyperlipidemia)   . CVA (cerebral infarction) 07/06/2014    Quay Burow, OTR/L 08/24/2014, 2:03 PM  [facility] [address] [place], [place], [address] Phone: [phone]   Fax:  [phone]

## 2014-08-25 ENCOUNTER — Ambulatory Visit: Payer: Medicaid Other | Admitting: Occupational Therapy

## 2014-08-25 ENCOUNTER — Ambulatory Visit: Payer: Medicaid Other | Admitting: Physical Therapy

## 2014-08-25 ENCOUNTER — Ambulatory Visit: Payer: Medicaid Other

## 2014-08-25 ENCOUNTER — Encounter: Payer: Self-pay | Admitting: Occupational Therapy

## 2014-08-25 VITALS — BP 137/98

## 2014-08-25 DIAGNOSIS — R41841 Cognitive communication deficit: Secondary | ICD-10-CM

## 2014-08-25 DIAGNOSIS — R262 Difficulty in walking, not elsewhere classified: Secondary | ICD-10-CM

## 2014-08-25 DIAGNOSIS — IMO0002 Reserved for concepts with insufficient information to code with codable children: Secondary | ICD-10-CM

## 2014-08-25 DIAGNOSIS — Z7409 Other reduced mobility: Secondary | ICD-10-CM

## 2014-08-25 DIAGNOSIS — I69398 Other sequelae of cerebral infarction: Secondary | ICD-10-CM | POA: Diagnosis not present

## 2014-08-25 NOTE — Therapy (Signed)
[facility] [address] [place], [place], [address] Phone: [phone]   Fax:  [phone]  Physical Therapy Treatment  Patient Details  Name: Samuel Shaw MRN: [id number] Date of Birth: 1961/05/12 Referring Provider:  Charlett Blake, MD  Encounter Date: 08/25/2014      PT End of Session - 08/25/14 1534    Visit Number 6   Number of Visits 17   Date for PT Re-Evaluation 10/03/14   Authorization Type self pay   PT Start Time 3790   PT Stop Time 1615   PT Time Calculation (min) 42 min   Equipment Utilized During Treatment Gait belt   Activity Tolerance Patient tolerated treatment well   Behavior During Therapy [facility] for tasks assessed/performed      Past Medical History  Diagnosis Date  . Stroke     No past surgical history on file.  There were no vitals taken for this visit.  Visit Diagnosis:  Difficulty walking  Impaired functional mobility and activity tolerance  Lack of coordination due to stroke      Subjective Assessment - 08/25/14 1530    Symptoms No new complaints. No falls or pain to report.   Currently in Pain? No/denies   Pain Score 0-No pain     Treatment:  BP before session: 137/87  Gait - 400 feet with single point cane with min guard assist and cues on increased right step length, increased left weight shift and on posture with gait. - up/down ramp with cane with min assist and cues on sequence/stride length. - over compliant surfaces (red and blue mats), around cones and over black bolsters of varied heights with cane and min assist to min guard assist x 4 laps. Cues on correct sequence with stepping over bolsters.  Neuro Re-ed: - single leg stance activities with tall cones: alternating forward toe taps, cross toe taps, forward double toe taps and cross double toe taps x 10 each with bil legs with up to min assist for balance. Single point cane use with double taps  only.  Exercise - with right foot on 4 inch box and left foot on floor: sit/stands 2 sets of 10 reps with no UE support. Cues on posture/to come up into full standing.   BP after session: 153/103 BP after 4-5 minute rest: 139/99  OT made aware of above reading and to continue to monitor with their session.       PT Short Term Goals - 08/04/14 1628    PT SHORT TERM GOAL #1   Title Demonstrate correct performance of home exercise program to address balance, gait and strength impairments. Target: 09/04/14   PT SHORT TERM GOAL #2   Title Increase Berg balance test score to 40/56 for improving balance.  Target: 09/04/14   PT SHORT TERM GOAL #3   Title Decrease TUG time to 35 seconds for improving efficiency with functional mobility with LRAD.  Target: 09/04/14   PT SHORT TERM GOAL #4   Title Begin gait training with either a single point cane or without an assistive device (with AFO) as appropriate.  Target: 09/04/14   PT SHORT TERM GOAL #5   Title Demonstrate ability to stand x5 minutes unsupported with upper extremity task for increased activity tolerance.  Target: 09/04/14           PT Long Term Goals - 08/04/14 1631    PT LONG TERM GOAL #1   Title Verbalize understanding of signs and  risk factors of CVA.  Target: 10/03/14   PT LONG TERM GOAL #2   Title Increase Berg Balance Test score to 47/56 for decreased fall risk.  Target: 10/03/14   PT LONG TERM GOAL #3   Title Ambulate 500' on indoor level surfaces without assistive device independently for increased independence with household ambulation. Target: 10/03/14   PT LONG TERM GOAL #4   Title Increase gait speed without assistive device to >1.8 ft/sec for decreased fall risk. Target: 10/03/14   PT LONG TERM GOAL #5   Title Perform TUG in <30 seconds without assistive device with safe turn, independently, for increased safety with household mobility. Target: 10/03/14   Additional Long Term Goals   Additional Long Term Goals Yes   PT LONG TERM  GOAL #6   Title Increase FOTO stroke impact mobility scale to 70% for decreased disability due to CVA. Target: 10/03/14           Plan - 08/25/14 1536    Clinical Impression Statement Pt making steady progress with balance, strength and gait with straight cane toward his goals. Limited by BP going up with activity.   Rehab Potential Good   Clinical Impairments Affecting Rehab Potential decreased awareness of deficits   PT Frequency 2x / week   PT Duration 8 weeks   PT Treatment/Interventions ADLs/Self Care Home Management;Therapeutic activities;Patient/family education;Therapeutic exercise;DME Instruction;Gait training;Balance training;Neuromuscular re-education;Functional mobility training;Stair training   PT Next Visit Plan check BP; balance and strengthening, gait with staight cane   Consulted and Agree with Plan of Care Patient        Problem List Patient Active Problem List   Diagnosis Date Noted  . Hemi-neglect of left side 07/16/2014  . Spastic hemiplegia affecting nondominant side 07/10/2014  . Cognitive deficit, post-stroke 07/10/2014  . Sinus bradycardia on ECG 07/10/2014  . 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 CVA (cerebral vascular accident)   . Tobacco abuse   . HLD (hyperlipidemia)   . CVA (cerebral infarction) 07/06/2014    Willow Ora 08/25/2014, 4:32 PM  Willow Ora, PTA, [facility] [address] [place], [place] [address] [phone] 08/25/2014, 4:32 PM

## 2014-08-25 NOTE — Patient Instructions (Signed)
Ischemic Stroke °A stroke (cerebrovascular accident) is the sudden death of brain tissue. It is a medical emergency. A stroke can cause permanent loss of brain function. This can cause problems with different parts of your body. A transient ischemic attack (TIA) is different because it does not cause permanent damage. A TIA is a short-lived problem of poor blood flow affecting a part of the brain. A TIA is also a serious problem because having a TIA greatly increases the chances of having a stroke. When symptoms first develop, you cannot know if the problem might be a stroke or a TIA. °CAUSES  °A stroke is caused by a decrease of oxygen supply to an area of your brain. It is usually the result of a small blood clot or collection of cholesterol or fat (plaque) that blocks blood flow in the brain. A stroke can also be caused by blocked or damaged carotid arteries.  °RISK FACTORS °· High blood pressure (hypertension). °· High cholesterol. °· Diabetes mellitus. °· Heart disease. °· The buildup of plaque in the blood vessels (peripheral artery disease or atherosclerosis). °· The buildup of plaque in the blood vessels providing blood and oxygen to the brain (carotid artery stenosis). °· An abnormal heart rhythm (atrial fibrillation). °· Obesity. °· Smoking. °· Taking oral contraceptives (especially in combination with smoking). °· Physical inactivity. °· A diet high in fats, salt (sodium), and calories. °· Alcohol use. °· Use of illegal drugs (especially cocaine and methamphetamine). °· Being African American. °· Being over the age of 55. °· Family history of stroke. °· Previous history of blood clots, stroke, TIA, or heart attack. °· Sickle cell disease. °SYMPTOMS  °These symptoms usually develop suddenly, or may be newly present upon awakening from sleep: °· Sudden weakness or numbness of the face, arm, or leg, especially on one side of the body. °· Sudden trouble walking or difficulty moving arms or legs. °· Sudden  confusion. °· Sudden personality changes. °· Trouble speaking (aphasia) or understanding. °· Difficulty swallowing. °· Sudden trouble seeing in one or both eyes. °· Double vision. °· Dizziness. °· Loss of balance or coordination. °· Sudden severe headache with no known cause. °· Trouble reading or writing. °DIAGNOSIS  °Your health care provider can often determine the presence or absence of a stroke based on your symptoms, history, and physical exam. Computed tomography (CT) of the brain is usually performed to confirm the stroke, determine causes, and determine stroke severity. Other tests may be done to find the cause of the stroke. These tests may include: °· Electrocardiography. °· Continuous heart monitoring. °· Echocardiography. °· Carotid ultrasonography. °· Magnetic resonance imaging (MRI). °· A scan of the brain circulation. °· Blood tests. °PREVENTION  °The risk of a stroke can be decreased by appropriately treating high blood pressure, high cholesterol, diabetes, heart disease, and obesity and by quitting smoking, limiting alcohol, and staying physically active. °TREATMENT  °Time is of the essence. It is important to seek treatment at the first sign of these symptoms because you may receive a medicine to dissolve the clot (thrombolytic) that cannot be given if too much time has passed since your symptoms began. Even if you do not know when your symptoms began, get treatment as soon as possible as there are other treatment options available including oxygen, intravenous (IV) fluids, and medicines to thin the blood (anticoagulants). Treatment of stroke depends on the duration, severity, and cause of your symptoms. Medicines and dietary changes may be used to address diabetes, high blood   pressure, and other risk factors. Physical, speech, and occupational therapists will assess you and work with you to improve any functions impaired by the stroke. Measures will be taken to prevent short-term and long-term  complications, including infection from breathing foreign material into the lungs (aspiration pneumonia), blood clots in the legs, bedsores, and falls. Rarely, surgery may be needed to remove large blood clots or to open up blocked arteries. °HOME CARE INSTRUCTIONS  °· Take medicines only as directed by your health care provider. Follow the directions carefully. Medicines may be used to control risk factors for a stroke. Be sure you understand all your medicine instructions. °· You may be told to take a medicine to thin the blood, such as aspirin or the anticoagulant warfarin. Warfarin needs to be taken exactly as instructed. °¨ Too much and too little warfarin are both dangerous. Too much warfarin increases the risk of bleeding. Too little warfarin continues to allow the risk for blood clots. While taking warfarin, you will need to have regular blood tests to measure your blood clotting time. These blood tests usually include both the PT and INR tests. The PT and INR results allow your health care provider to adjust your dose of warfarin. The dose can change for many reasons. It is critically important that you take warfarin exactly as prescribed, and that you have your PT and INR levels drawn exactly as directed. °¨ Many foods, especially foods high in vitamin K, can interfere with warfarin and affect the PT and INR results. Foods high in vitamin K include spinach, kale, broccoli, cabbage, collard and turnip greens, brussels sprouts, peas, cauliflower, seaweed, and parsley, as well as beef and pork liver, green tea, and soybean oil. You should eat a consistent amount of foods high in vitamin K. Avoid major changes in your diet, or notify your health care provider before changing your diet. Arrange a visit with a dietitian to answer your questions. °¨ Many medicines can interfere with warfarin and affect the PT and INR results. You must tell your health care provider about any and all medicines you take. This  includes all vitamins and supplements. Be especially cautious with aspirin and anti-inflammatory medicines. Do not take or discontinue any prescribed or over-the-counter medicine except on the advice of your health care provider or pharmacist. °¨ Warfarin can have side effects, such as excessive bruising or bleeding. You will need to hold pressure over cuts for longer than usual. Your health care provider or pharmacist will discuss other potential side effects. °¨ Avoid sports or activities that may cause injury or bleeding. °¨ Be mindful when shaving, flossing your teeth, or handling sharp objects. °¨ Alcohol can change the body's ability to handle warfarin. It is best to avoid alcoholic drinks or consume only very small amounts while taking warfarin. Notify your health care provider if you change your alcohol intake. °¨ Notify your dentist or other health care providers before procedures. °· If swallow studies have determined that your swallowing reflex is present, you should eat healthy foods. Including 5 or more servings of fruits and vegetables a day may reduce the risk of stroke. Foods may need to be a certain consistency (soft or pureed), or small bites may need to be taken in order to avoid aspirating or choking. Certain dietary changes may be advised to address high blood pressure, high cholesterol, diabetes, or obesity. °¨ Food choices that are low in sodium, saturated fat, trans fat, and cholesterol are recommended to manage high blood pressure. °¨   Food choies that are high in fiber, and low in saturated fat, trans fat, and cholesterol may control cholesterol levels. °¨ Controlling carbohydrates and sugar intake is recommended to manage diabetes. °¨ Reducing calorie intake and making food choices that are low in sodium, saturated fat, trans fat, and cholesterol are recommended to manage obesity. °· Maintain a healthy weight. °· Stay physically active. It is recommended that you get at least 30 minutes of  activity on all or most days. °· Do not use any tobacco products including cigarettes, chewing tobacco, or electronic cigarettes. °· Limit alcohol use even if you are not taking warfarin. Moderate alcohol use is considered to be: °¨ No more than 2 drinks each day for men. °¨ No more than 1 drink each day for nonpregnant women. °· Home safety. A safe home environment is important to reduce the risk of falls. Your health care provider may arrange for specialists to evaluate your home. Having grab bars in the bedroom and bathroom is often important. Your health care provider may arrange for equipment to be used at home, such as raised toilets and a seat for the shower. °· Physical, occupational, and speech therapy. Ongoing therapy may be needed to maximize your recovery after a stroke. If you have been advised to use a walker or a cane, use it at all times. Be sure to keep your therapy appointments. °· Follow all instructions for follow-up with your health care provider. This is very important. This includes any referrals, physical therapy, rehabilitation, and lab tests. Proper follow-up can prevent another stroke from occurring. °SEEK MEDICAL CARE IF: °· You have personality changes. °· You have difficulty swallowing. °· You are seeing double. °· You have dizziness. °· You have a fever. °· You have skin breakdown. °SEEK IMMEDIATE MEDICAL CARE IF:  °Any of these symptoms may represent a serious problem that is an emergency. Do not wait to see if the symptoms will go away. Get medical help right away. Call your local emergency services (911 in [place] Do not drive yourself to the hospital. °· You have sudden weakness or numbness of the face, arm, or leg, especially on one side of the body. °· You have sudden trouble walking or difficulty moving arms or legs. °· You have sudden confusion. °· You have trouble speaking (aphasia) or understanding. °· You have sudden trouble seeing in one or both eyes. °· You have a loss of  balance or coordination. °· You have a sudden, severe headache with no known cause. °· You have new chest pain or an irregular heartbeat. °· You have a partial or total loss of consciousness. °Document Released: 06/19/2005 Document Revised: 11/03/2013 Document Reviewed: 01/28/2012 °[employer]® Patient Information ©2015 [employer]. This information is not intended to replace advice given to you by your health care provider. Make sure you discuss any questions you have with your health care provider. ° °

## 2014-08-25 NOTE — Therapy (Signed)
[facility] [address] [place], [place], [address] Phone: [phone]   Fax:  [phone]  Occupational Therapy Treatment  Patient Details  Name: Samuel Shaw MRN: [id number] Date of Birth: Oct 08, 1960 Referring Provider:  Charlett Blake, MD  Encounter Date: 08/25/2014      OT End of Session - 08/25/14 1657    Visit Number 6   Number of Visits 16   Date for OT Re-Evaluation 09/29/14   Authorization Type self pay   OT Start Time 4481   OT Stop Time 1645   OT Time Calculation (min) 28 min   Activity Tolerance Patient tolerated treatment well;Treatment limited secondary to medical complications (Comment)  High BP      Past Medical History  Diagnosis Date  . Stroke     History reviewed. No pertinent past surgical history.  BP 137/98 mmHg  Visit Diagnosis:  Lack of coordination due to stroke      Subjective Assessment - 08/25/14 1622    Symptoms I can't tell when my B/P goes up    Pertinent History see epic snapshot   Currently in Pain? No/denies                 OT Treatments/Exercises (OP) - 08/25/14 0001    Fine Motor Coordination   Other Fine Motor Exercises Pt with starting BP of 137/98 therefore limited treatment to table top activities to address fine motor coordination focusing on picking and manipulating gradually smaller items for functional tasks. After 30 minutes of activity BP at 152/104 and session was terminated. Pt and wife both aware of BP issues and wife recording BP and will again report to MD.  Pt and wife both also aware that BP issues are negatively impacting therapy sessions and will make MD aware of this as well.                OT Education - 08/24/14 1335    Education provided Yes   Education Details HEP for coordination   Person(s) Educated Patient   Methods Explanation;Demonstration;Verbal cues;Handout   Comprehension Verbalized understanding;Returned  demonstration          OT Short Term Goals - 08/25/14 1700    OT SHORT TERM GOAL #1   Title Pt will be mod I with HEP - 09/01/2014   Status On-going   OT SHORT TERM GOAL #2   Title Pt will be mod I with cutting with AE prn   Status Achieved   OT SHORT TERM GOAL #3   Title Pt will be mod I with bathing   Status Achieved   OT SHORT TERM GOAL #4   Title Pt will be mod with toilet transfers   Status Achieved   OT SHORT TERM GOAL #5   Title Pt will be mod I with shower transfers   Status On-going   OT SHORT TERM GOAL #6   Title Pt will be able to reach for 3 pound object on shelf with LUE at mid level   Status New   OT SHORT TERM GOAL #7   Title Pt will require min a for basic organization for meal prep   Status New           OT Long Term Goals - 08/25/14 1700    OT LONG TERM GOAL #1   Title Pt will be mod I with upgraded HEP - 09/29/2014   Status On-going   OT LONG TERM GOAL #2  Title Pt will be able to reach at high reach level to obtain 3 pound object   Status On-going   OT LONG TERM GOAL #3   Title Pt will demonstrate improved coodination as evidenced by decreasing time on 9 hole peg test by 10 seconds to assist with functional tasks (baseline= 1.00.78)   Status On-going   OT LONG TERM GOAL #4   Title Pt will improve in functional use of LUE as evidenced by increasing box and blocks by at least 5 blocks (baseline= 33)   Status On-going   OT LONG TERM GOAL #5   Title Pt will be mod I for simple meal prep at ambulatory level   Status On-going               Plan - 08/25/14 1658    Clinical Impression Statement Pt makng slow gains however pt's ability to participate in therapy is being impacted by fluctuating and high BP. Wife and pt, as well as MD aware. Wife to contact MD again.   Rehab Potential Good   Clinical Impairments Affecting Rehab Potential cognition, left hemiplegia   OT Frequency 2x / week   OT Duration 8 weeks   OT Treatment/Interventions  Self-care/ADL training;Moist Heat;Therapeutic exercise;Neuromuscular education;DME and/or AE instruction;Manual Therapy;Functional Mobility Training;Passive range of motion;Therapeutic activities;Cognitive remediation/compensation;Visual/perceptual remediation/compensation;Patient/family education;Balance training   Plan check BP, if medically able address reaching in standing, standing balance in functional tasks   Consulted and Agree with Plan of Care Patient   Family Member Consulted wife        Problem List Patient Active Problem List   Diagnosis Date Noted  . Hemi-neglect of left side 07/16/2014  . Spastic hemiplegia affecting nondominant side 07/10/2014  . Cognitive deficit, post-stroke 07/10/2014  . Sinus bradycardia on ECG 07/10/2014  . 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 CVA (cerebral vascular accident)   . Tobacco abuse   . HLD (hyperlipidemia)   . CVA (cerebral infarction) 07/06/2014    Quay Burow, OTR/L 08/25/2014, 5:01 PM  [facility] [address] [place], [place], [address] Phone: [phone]   Fax:  [phone]

## 2014-08-25 NOTE — Therapy (Signed)
[facility] [address] [place], [place], [address] Phone: [phone]   Fax:  [phone]  Speech Language Pathology Treatment  Patient Details  Name: Samuel Shaw MRN: [id number] Date of Birth: 1960/12/11 Referring Provider:  Charlett Blake, MD  Encounter Date: 08/25/2014      End of Session - 08/25/14 1538    Visit Number 5   Number of Visits 24   Date for SLP Re-Evaluation 11/01/14   SLP Start Time 1449   SLP Stop Time  1530   SLP Time Calculation (min) 41 min   Activity Tolerance Patient tolerated treatment well      Past Medical History  Diagnosis Date  . Stroke     No past surgical history on file.  There were no vitals taken for this visit.  Visit Diagnosis: Cognitive communication deficit      Subjective Assessment - 08/25/14 1500    Symptoms "I don't take care none of that." (re: bills and cooking, and appointment management)             ADULT SLP TREATMENT - 08/25/14 1501    General Information   Behavior/Cognition Alert;Cooperative;Pleasant mood   Treatment Provided   Treatment provided Cognitive-Linquistic   Pain Assessment   Pain Assessment No/denies pain   Cognitive-Linquistic Treatment   Treatment focused on Cognition   Skilled Treatment Pt tracking SLP (on left) during conversation 75-80% of the time - appeared WNL. SLP assessed pt's problem solving in conversation re: his job. Pt exhibited what appeared to be WNL skills in this familiar topic. Pt does not complete money management nor appointment tracking at home.   Assessment / Recommendations / Plan   Plan --  will decr to x1/week due to skills in conversation WNL/WFL   Progression Toward Goals   Progression toward goals --  Pt most concerned with physical deficits, will decr to x1/wk            SLP Short Term Goals - 08/25/14 1541    SLP SHORT TERM GOAL #1   Title pt to tell SLP 3 cognitive-linguistic  deficits   Time 2   Period Weeks   Status On-going   SLP SHORT TERM GOAL #2   Title pt to demo emergent awareness with rare min A   Time 2   Status On-going   SLP SHORT TERM GOAL #3   Title pt to tell SLP four memory strategies with modified independence   Time 2   Status On-going   SLP SHORT TERM GOAL #4   Title pt arrive with a planner/organizer to therapy for 3 sessions, in order to improve/compensate for decr'd prospective and retrospective memory   Time 2   Period Weeks   Status Deferred          SLP Long Term Goals - 08/25/14 1543    SLP LONG TERM GOAL #1   Title pt will demo verbal anticipatory awareness in conversation, in each of 3 different situations relating to pt deficits   Status Achieved   SLP LONG TERM GOAL #2   Title pt will utilize memory compensations x3 in 4 therapy sessions   Time 10   Period Weeks   Status On-going          Plan - 08/25/14 1539    Clinical Impression Statement No notable lt inattention in conversation today. Will decr to x1/week as pt is more concerned with physical deficits than any remaining cognitive deficits.  Speech Therapy Frequency 1x /week   Duration --  no more than 4 more weeks suspected   Treatment/Interventions Cueing hierarchy;Functional tasks;Patient/family education;Compensatory strategies;SLP instruction and feedback;Cognitive reorganization   Potential to Achieve Goals Good        Problem List Patient Active Problem List   Diagnosis Date Noted  . Hemi-neglect of left side 07/16/2014  . Spastic hemiplegia affecting nondominant side 07/10/2014  . Cognitive deficit, post-stroke 07/10/2014  . Sinus bradycardia on ECG 07/10/2014  . 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 CVA (cerebral vascular accident)   . Tobacco abuse   . HLD (hyperlipidemia)   . CVA (cerebral infarction) 07/06/2014    [facility], SLP 08/25/2014, 3:44 PM  [facility] [address] [place], [place], [address] Phone: [phone]   Fax:  [phone]

## 2014-08-27 ENCOUNTER — Inpatient Hospital Stay: Payer: BLUE CROSS/BLUE SHIELD | Admitting: Physical Medicine & Rehabilitation

## 2014-08-28 ENCOUNTER — Encounter: Payer: Medicaid Other | Attending: Physical Medicine & Rehabilitation

## 2014-08-28 ENCOUNTER — Encounter: Payer: Self-pay | Admitting: Physical Medicine & Rehabilitation

## 2014-08-28 ENCOUNTER — Ambulatory Visit (HOSPITAL_BASED_OUTPATIENT_CLINIC_OR_DEPARTMENT_OTHER): Payer: Medicaid Other | Admitting: Physical Medicine & Rehabilitation

## 2014-08-28 VITALS — BP 136/74 | HR 58 | Resp 14

## 2014-08-28 DIAGNOSIS — G8110 Spastic hemiplegia affecting unspecified side: Secondary | ICD-10-CM | POA: Diagnosis present

## 2014-08-28 DIAGNOSIS — I6931 Cognitive deficits following cerebral infarction: Secondary | ICD-10-CM

## 2014-08-28 DIAGNOSIS — I63511 Cerebral infarction due to unspecified occlusion or stenosis of right middle cerebral artery: Secondary | ICD-10-CM | POA: Diagnosis not present

## 2014-08-28 DIAGNOSIS — R414 Neurologic neglect syndrome: Secondary | ICD-10-CM | POA: Diagnosis not present

## 2014-08-28 DIAGNOSIS — I69319 Unspecified symptoms and signs involving cognitive functions following cerebral infarction: Secondary | ICD-10-CM

## 2014-08-28 MED ORDER — TIZANIDINE HCL 2 MG PO TABS: 2.0000 mg | ORAL_TABLET | Freq: Three times a day (TID) | ORAL | Status: DC

## 2014-08-28 NOTE — Progress Notes (Signed)
Subjective:    Patient ID: Samuel Shaw, male    DOB: 1960-11-23, 54 y.o.   MRN: [id number] 53 y.o. male with history of tobacco abuse, TIA 2 years ago, CVA late December with recent discharge from hospital in [place] where he was visiting and maintained on aspirin 325 mg daily. Patient lives with his wife in [place]. Presented 07/06/2014 with progressive left-sided weakness and slurred speech since recent discharge from the hospital. CT scan reviewed from outside hospital in December showed acute right basal ganglia infarct. There was possible right ICA stenosis identified on carotid ultrasound. MRI of the brain 07/06/2014 showed acute multifocal infarcts within right middle cerebral artery territory including right basal ganglia, right anterior occipital and right middle cerebral artery territory/ watershed. 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 CTA  of the neck showed right ICA occlusion 1 cm above its origin. Echocardiogram showed ejection fraction 60% no wall motion abnormalities  HPI Patient back at home, going to outpatient therapy at neuro rehabilitation Receiving PT and OT twice a week and now going down to once a week on speech therapy. No falls at home Had one emergency department visit for elevated blood pressure Patient bathes himself except for needing help with his back Also dresses himself with exception of getting his pants on as well as getting on his left AFO Has a primary care physician Dr. Berdine Addison on February 17 Pain Inventory Average Pain 0 Pain Right Now 0 My pain is NO PAIN  In the last 24 hours, has pain interfered with the following? General activity 0 Relation with others 0 Enjoyment of life 0 What TIME of day is your pain at its worst? NO PAIN Sleep (in general) Good  Pain is worse with: NO PAIN Pain improves with: NO PAIN Relief from Meds: 0  Mobility use a cane how many minutes can you walk? 5 ability to climb steps?  no do you  drive?  no  Function disabled: date disabled .  Neuro/Psych trouble walking confusion depression anxiety  Prior Studies Any changes since last visit?  no  Physicians involved in your care Any changes since last visit?  no   Family History  Problem Relation Age of Onset  . Diabetes Mother    History   Social History  . Marital Status: Married    Spouse Name: N/A  . Number of Children: N/A  . Years of Education: N/A   Social History Main Topics  . Smoking status: Former Smoker -- 1.00 packs/day for 15 years    Types: Cigarettes  . Smokeless tobacco: Never Used  . Alcohol Use: No  . Drug Use: No  . Sexual Activity: Not on file   Other Topics Concern  . None   Social History Narrative   History reviewed. No pertinent past surgical history. Past Medical History  Diagnosis Date  . Stroke    BP 136/74 mmHg  Pulse 58  Resp 14  SpO2 97%  Opioid Risk Score:   Fall Risk Score:    Review of Systems  Constitutional: Negative.   HENT: Negative.   Eyes: Negative.   Respiratory: Negative.   Cardiovascular: Negative.   Gastrointestinal: Negative.   Endocrine: Negative.   Genitourinary: Negative.   Musculoskeletal: Negative.   Skin: Negative.   Allergic/Immunologic: Negative.   Hematological: Negative.   Psychiatric/Behavioral: Positive for confusion, dysphoric mood and decreased concentration. The patient is nervous/anxious.        Objective:   Physical Exam  Constitutional: He is oriented to person, place, and time. He appears well-developed and well-nourished.  HENT:  Head: Normocephalic and atraumatic.  Eyes: Conjunctivae are normal. Pupils are equal, round, and reactive to light.  Neurological: He is alert and oriented to person, place, and time.  Nursing note and vitals reviewed. JAMA R position to 70 pounds grip left, 130 pounds grip right Motor strength left deltoid biceps triceps 4 Left hip flexor and knee extensor 4 left ankle dorsiflexor 3  minus left foot inversion and eversion 2 minus  Sensation intact to light touch in both upper extremities is Wells lower extremities Ambulates with a quad cane and left AFO no evidence of toe drag or knee instability. Visual fields are intact to confrontation testing No evidence of neglect      Assessment & Plan:  1. 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 Still having weakness in the left upper and left lower limb. Still requiring AFO however over time I expect this to improve  Wife is trying to get disability, she gave me a disability form she stated it was from Social security however did not have any of the usual letterhead, suspected may have been from an attorney's office. At this point I don't think the patient is done healing and I don't think I can accurately gauge his long-term disability.  Follow-up with PCP See neurology in April RTC 1 month We'll follow up in 1 month

## 2014-08-28 NOTE — Patient Instructions (Signed)
Do not recommend ibuprofen Recommend Tylenol either regular or extra strength 1-2 tablets 3 times per day as needed

## 2014-09-01 ENCOUNTER — Encounter: Payer: Self-pay | Admitting: Occupational Therapy

## 2014-09-01 ENCOUNTER — Ambulatory Visit: Payer: BLUE CROSS/BLUE SHIELD | Admitting: Physical Therapy

## 2014-09-01 ENCOUNTER — Ambulatory Visit: Payer: BLUE CROSS/BLUE SHIELD | Attending: Physical Medicine & Rehabilitation

## 2014-09-01 ENCOUNTER — Ambulatory Visit: Payer: BLUE CROSS/BLUE SHIELD | Admitting: Occupational Therapy

## 2014-09-01 DIAGNOSIS — I69398 Other sequelae of cerebral infarction: Secondary | ICD-10-CM | POA: Diagnosis present

## 2014-09-01 DIAGNOSIS — R279 Unspecified lack of coordination: Secondary | ICD-10-CM | POA: Insufficient documentation

## 2014-09-01 DIAGNOSIS — F17200 Nicotine dependence, unspecified, uncomplicated: Secondary | ICD-10-CM | POA: Insufficient documentation

## 2014-09-01 DIAGNOSIS — R41841 Cognitive communication deficit: Secondary | ICD-10-CM | POA: Diagnosis not present

## 2014-09-01 DIAGNOSIS — R262 Difficulty in walking, not elsewhere classified: Secondary | ICD-10-CM

## 2014-09-01 DIAGNOSIS — E785 Hyperlipidemia, unspecified: Secondary | ICD-10-CM | POA: Diagnosis not present

## 2014-09-01 DIAGNOSIS — Z7409 Other reduced mobility: Secondary | ICD-10-CM

## 2014-09-01 DIAGNOSIS — I69354 Hemiplegia and hemiparesis following cerebral infarction affecting left non-dominant side: Secondary | ICD-10-CM | POA: Diagnosis not present

## 2014-09-01 DIAGNOSIS — G8194 Hemiplegia, unspecified affecting left nondominant side: Secondary | ICD-10-CM

## 2014-09-01 DIAGNOSIS — R4189 Other symptoms and signs involving cognitive functions and awareness: Secondary | ICD-10-CM

## 2014-09-01 NOTE — Therapy (Signed)
[facility] [address] [place], [place], [address] Phone: [phone]   Fax:  [phone]  Occupational Therapy Treatment  Patient Details  Name: Samuel Shaw MRN: [id number] Date of Birth: 18-Jul-1960 Referring Provider:  Charlett Blake, MD  Encounter Date: 09/01/2014      OT End of Session - 09/01/14 1720    Visit Number 7   Number of Visits 16   Date for OT Re-Evaluation 09/29/14   Authorization Type self pay   OT Start Time 2683   OT Stop Time 1656   OT Time Calculation (min) 39 min   Activity Tolerance Treatment limited secondary to medical complications (Comment)  BP issues      Past Medical History  Diagnosis Date  . Stroke     History reviewed. No pertinent past surgical history.  There were no vitals taken for this visit.  Visit Diagnosis:  Impaired functional mobility and activity tolerance  Impaired cognition  Hemiplegia affecting left nondominant side      Subjective Assessment - 09/01/14 1622    Symptoms I feel great   Pertinent History see epic snapshot   Currently in Pain? No/denies                 OT Treatments/Exercises (OP) - 09/01/14 0001    ADLs   Cooking Pt able to make simple hot meal at ambulatory level mod I. Pt demonstrated good problem solving and safety with task.   Neurological Re-education Exercises   Other Exercises 1 Neuro re ed to incorporate LUE into functional cooking task as much as possible - pt needs min vc's to use LUE due to weakness and L neglect. Also addressed increasing weightbearing on LLE during standing and functional ambulation to improve trunk control and LUE function especially with overhead reach. Pt continues to experience high BP issue - at start of session BP = 147/96 and by end of session 147/102.  Notified MD via inbasket thru Epic and wife and pt are aware.                  OT Short Term Goals - 09/01/14 1723    OT SHORT TERM GOAL #1   Title Pt will be mod I with HEP - 09/01/2014   Status Achieved   OT SHORT TERM GOAL #2   Title Pt will be mod I with cutting with AE prn   Status Achieved   OT SHORT TERM GOAL #3   Title Pt will be mod I with bathing   Status Achieved   OT SHORT TERM GOAL #4   Title Pt will be mod with toilet transfers   Status Achieved   OT SHORT TERM GOAL #5   Title Pt will be mod I with shower transfers  wife does not feel comfortable with mod I transfers   Status On-going   OT SHORT TERM GOAL #6   Title Pt will be able to reach for 3 pound object on shelf with LUE at mid level   Status Achieved   OT SHORT TERM GOAL #7   Title Pt will require min a for basic organization for meal prep   Status Achieved           OT Long Term Goals - 09/01/14 1724    OT LONG TERM GOAL #1   Title Pt will be mod I with upgraded HEP - 09/29/2014   Status On-going   OT LONG TERM GOAL #2  Title Pt will be able to reach at high reach level to obtain 3 pound object   Status On-going   OT LONG TERM GOAL #3   Title Pt will demonstrate improved coodination as evidenced by decreasing time on 9 hole peg test by 10 seconds to assist with functional tasks (baseline= 1.00.78)   Status On-going   OT LONG TERM GOAL #4   Title Pt will improve in functional use of LUE as evidenced by increasing box and blocks by at least 5 blocks (baseline= 33)   Status On-going   OT LONG TERM GOAL #5   Title Pt will be mod I for simple meal prep at ambulatory level   Status On-going               Plan - 09/01/14 1721    Clinical Impression Statement Pt making good progress toward goals despite continued BP issues (high). Rehab MD has been notified and await response.  Will continue to monitor during therapy   Rehab Potential Good   Clinical Impairments Affecting Rehab Potential cognition, left hemiplegia   OT Frequency 2x / week   OT Duration 8 weeks   OT Treatment/Interventions Self-care/ADL  training;Moist Heat;Therapeutic exercise;Neuromuscular education;DME and/or AE instruction;Manual Therapy;Functional Mobility Training;Passive range of motion;Therapeutic activities;Cognitive remediation/compensation;Visual/perceptual remediation/compensation;Patient/family education;Balance training   Plan check BP, if medically able address overhead reach with functional grasp and relase tasks, standing balance, symmetrical weight bearing in standing and mobility   Consulted and Agree with Plan of Care Patient   Family Member Consulted wife        Problem List Patient Active Problem List   Diagnosis Date Noted  . Hemi-neglect of left side 07/16/2014  . Spastic hemiplegia affecting nondominant side 07/10/2014  . Cognitive deficit, post-stroke 07/10/2014  . Sinus bradycardia on ECG 07/10/2014  . 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 CVA (cerebral vascular accident)   . Tobacco abuse   . HLD (hyperlipidemia)   . CVA (cerebral infarction) 07/06/2014    Quay Burow, OTR/L 09/01/2014, 5:25 PM  [facility] [address] [place], [place], [address] Phone: [phone]   Fax:  [phone]

## 2014-09-01 NOTE — Therapy (Signed)
[facility] [address] [facility] [place], [place], [address] Phone: [phone]   Fax:  [phone]  Speech Language Pathology Treatment  Patient Details  Name: Samuel Shaw MRN: [id number] Date of Birth: [birth year] Referring Provider:  Charlett Blake, MD  Encounter Date: 09/01/2014      End of Session - 09/01/14 1525    Visit Number 6   Number of Visits 24   Date for SLP Re-Evaluation 11/01/14   SLP Start Time 1455   SLP Stop Time  1527   SLP Time Calculation (min) 32 min   Activity Tolerance Patient tolerated treatment well      Past Medical History  Diagnosis Date  . Stroke     No past surgical history on file.  There were no vitals taken for this visit.  Visit Diagnosis: Cognitive communication deficit      Subjective Assessment - 09/01/14 1503    Symptoms 10 minutes late today. Final visit for ST.             ADULT SLP TREATMENT - 09/01/14 1504    General Information   Behavior/Cognition Alert;Cooperative;Pleasant mood   Treatment Provided   Treatment provided Cognitive-Linquistic   Pain Assessment   Pain Assessment No/denies pain   Cognitive-Linquistic Treatment   Treatment focused on Cognition   Skilled Treatment Pt cont to track SLP on lt today and looked to lt with handouts on lt side (CVA education). Treatment limited today due to time. Pt remarked to SLP he does not think anything is wrong with his thinking since CVA. Wife shared with SLP out of tx room that pt let water run over and req'd cues to turn water off, since last therapy visits.   Assessment / Recommendations / Plan   Plan Other (Comment)  pt discharged today. pt cont resistant to deficits          SLP Education - 09/01/14 1524    Education provided Yes   Education Details CVA education (risk factors, signs/symptoms)   Person(s) Educated Patient   Methods Explanation;Handout   Comprehension Verbalized  understanding          SLP Short Term Goals - 09/01/14 1541    SLP SHORT TERM GOAL #1   Title pt to tell SLP 3 cognitive-linguistic deficits   Time 2   Period Weeks   Status Not Met   SLP SHORT TERM GOAL #2   Title pt to demo emergent awareness with rare min A   Time 2   Status Not Met   SLP SHORT TERM GOAL #3   Title pt to tell SLP four memory strategies with modified independence   Time 2   Status Not Met   SLP SHORT TERM GOAL #4   Title pt arrive with a planner/organizer to therapy for 3 sessions, in order to improve/compensate for decr'd prospective and retrospective memory   Time 2   Period Weeks   Status Deferred          SLP Long Term Goals - 09/01/14 1541    SLP LONG TERM GOAL #1   Title pt will demo verbal anticipatory awareness in conversation, in each of 3 different situations relating to pt deficits   Status Achieved   SLP LONG TERM GOAL #2   Title pt will utilize memory compensations x3 in 4 therapy sessions   Time 10   Period Weeks   Status Not Met  Plan - 09/01/14 1540    Clinical Impression Statement Pt cont as resistant to any deficits cognitively, even after incident at home of wife pointing out incidence of attention deficit. Discharge appropriate at this time. SLP suggested to wife that pt possibly live with his mother for some time as she will not let pt explain away deficits.   Treatment/Interventions Cueing hierarchy;Functional tasks;Patient/family education;Compensatory strategies;SLP instruction and feedback;Cognitive reorganization   Potential to Achieve Goals Good   Potential Considerations Cooperation/participation level;Severity of impairments     SPEECH THERAPY DISCHARGE SUMMARY  Visits from Start of Care: 6  Current functional level related to goals / functional outcomes: Pt made minimal gains in therapy due to resistance about deficit areas. Even with wife drawing pt's attention to cognitive deficits since last visit, pt  continues as resistant to therapy for cognitive-communication deficits.   Remaining deficits: Attention, executive function, problem solving.   Education / Equipment: CVA education, awareness of deficits Plan: Patient agrees to discharge.  Patient goals were not met. Patient is being discharged due to lack of progress.  ?????        Problem List Patient Active Problem List   Diagnosis Date Noted  . Hemi-neglect of left side 07/16/2014  . Spastic hemiplegia affecting nondominant side 07/10/2014  . Cognitive deficit, post-stroke 07/10/2014  . Sinus bradycardia on ECG 07/10/2014  . 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 CVA (cerebral vascular accident)   . Tobacco abuse   . HLD (hyperlipidemia)   . CVA (cerebral infarction) 07/06/2014    [facility], SLP 09/01/2014, 3:45 PM  [facility] [address] [place], [place], [address] Phone: [phone]   Fax:  [phone]

## 2014-09-01 NOTE — Therapy (Signed)
[facility] [address] [place], [place], [address] Phone: [phone]   Fax:  [phone]  Physical Therapy Treatment  Patient Details  Name: Samuel Shaw MRN: [id number] Date of Birth: 1961/04/25 Referring Provider:  Charlett Blake, MD  Encounter Date: 09/01/2014      PT End of Session - 09/01/14 1530    Visit Number 7   Number of Visits 17   Date for PT Re-Evaluation 10/03/14   Authorization Type self pay   PT Start Time 3570   PT Stop Time 1615   PT Time Calculation (min) 45 min   Equipment Utilized During Treatment Gait belt   Activity Tolerance Patient tolerated treatment well   Behavior During Therapy [facility] for tasks assessed/performed      Past Medical History  Diagnosis Date  . Stroke     No past surgical history on file.  There were no vitals taken for this visit.  Visit Diagnosis:  Difficulty walking  Impaired functional mobility and activity tolerance      Subjective Assessment - 09/01/14 1530    Symptoms No new complaints. No falls or pain to report.   Currently in Pain? No/denies   Pain Score 0-No pain     BP before 140/90 right arm automated, HR 60       [facility] Adult PT Treatment/Exercise - 09/01/14 1533    Berg Balance Test   Sit to Stand Able to stand without using hands and stabilize independently   Standing Unsupported Able to stand safely 2 minutes   Sitting with Back Unsupported but Feet Supported on Floor or Stool Able to sit safely and securely 2 minutes   Stand to Sit Sits safely with minimal use of hands   Transfers Able to transfer safely, minor use of hands   Standing Unsupported with Eyes Closed Able to stand 10 seconds with supervision   Standing Ubsupported with Feet Together Able to place feet together independently and stand 1 minute safely   From Standing, Reach Forward with Outstretched Arm Can reach confidently >25 cm (10")  10   From Standing  Position, Pick up Object from Floor Able to pick up shoe safely and easily   From Standing Position, Turn to Look Behind Over each Shoulder Looks behind one side only/other side shows less weight shift   Turn 360 Degrees Able to turn 360 degrees safely but slowly   Standing Unsupported, Alternately Place Feet on Step/Stool Able to complete 4 steps without aid or supervision   Standing Unsupported, One Foot in Front Able to plae foot ahead of the other independently and hold 30 seconds   Standing on One Leg Able to lift leg independently and hold 5-10 seconds  right stance only, left <3 seconds   Total Score 48   Timed Up and Go Test   Normal TUG (seconds) 18  with straight cane     Gait - 430 feet x 1 with straight cane with min guard assist to min assist with 2 loss of balance due to toe scuffing on left foot. Cues on posture, increased right knee/hip flexion with swing phase and for increased step length with left leg.  BP after gait: 151/97 right arm automated  Exercise - with red theraband: left ankle DF, PF, inversion and eversion x 10 each way. Pt to start doing this at home. Issued theraband.  BP after exercises 151/106 right arm automated. Recheck with manual was 140/96.  PT Short Term Goals - 09/01/14 1634    PT SHORT TERM GOAL #1   Title Demonstrate correct performance of home exercise program to address balance, gait and strength impairments. Target: 09/04/14   Status Achieved   PT SHORT TERM GOAL #2   Title Increase Berg balance test score to 40/56 for improving balance.  Target: 09/04/14   Baseline met 09/01/14- 48/56   Status Achieved   PT SHORT TERM GOAL #3   Title Decrease TUG time to 35 seconds for improving efficiency with functional mobility with LRAD.  Target: 09/04/14   Baseline met 09/01/14- 18.0 sec's   Status Achieved   PT SHORT TERM GOAL #4   Title Begin gait training with either a single point cane or without an assistive device (with AFO) as appropriate.   Target: 09/04/14   Baseline met by 09/01/14   Status Achieved   PT SHORT TERM GOAL #5   Title Demonstrate ability to stand x5 minutes unsupported with upper extremity task for increased activity tolerance.  Target: 09/04/14   Baseline met 09/01/14   Status Achieved           PT Long Term Goals - 09/01/14 1635    PT LONG TERM GOAL #1   Title Verbalize understanding of signs and risk factors of CVA.  Target: 10/03/14   PT LONG TERM GOAL #2   Title Increase Berg Balance Test score to 47/56 for decreased fall risk.  Target: 10/03/14   Baseline met 09/01/14- 48/56   Status Achieved   PT LONG TERM GOAL #3   Title Ambulate 500' on indoor level surfaces without assistive device independently for increased independence with household ambulation. Target: 10/03/14   PT LONG TERM GOAL #4   Title Increase gait speed without assistive device to >1.8 ft/sec for decreased fall risk. Target: 10/03/14   PT LONG TERM GOAL #5   Title Perform TUG in <30 seconds without assistive device with safe turn, independently, for increased safety with household mobility. Target: 10/03/14   PT LONG TERM GOAL #6   Title Increase FOTO stroke impact mobility scale to 70% for decreased disability due to CVA. Target: 10/03/14               Plan - 09/01/14 1530    Clinical Impression Statement All STG's met today. Progressing toward LTG's. Continues to have elevated BP with activity. OT notifed and to monitor during her session.   Rehab Potential Good   Clinical Impairments Affecting Rehab Potential decreased awareness of deficits   PT Frequency 2x / week   PT Duration 8 weeks   PT Treatment/Interventions ADLs/Self Care Home Management;Therapeutic activities;Patient/family education;Therapeutic exercise;DME Instruction;Gait training;Balance training;Neuromuscular re-education;Functional mobility training;Stair training   PT Next Visit Plan check BP; balance and strengthening, gait with staight cane   Consulted and Agree with Plan  of Care Patient        Problem List Patient Active Problem List   Diagnosis Date Noted  . Hemi-neglect of left side 07/16/2014  . Spastic hemiplegia affecting nondominant side 07/10/2014  . Cognitive deficit, post-stroke 07/10/2014  . Sinus bradycardia on ECG 07/10/2014  . 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 CVA (cerebral vascular accident)   . Tobacco abuse   . HLD (hyperlipidemia)   . CVA (cerebral infarction) 07/06/2014    Willow Ora 09/01/2014, 4:36 PM  Willow Ora, PTA, [facility] [address] [place], [place] [address] [phone] 09/01/2014, 4:36 PM

## 2014-09-01 NOTE — Patient Instructions (Signed)
  Paper copies of cva education were provided (signs and symptoms of CVA, CVA risk factors)

## 2014-09-03 ENCOUNTER — Ambulatory Visit: Payer: BLUE CROSS/BLUE SHIELD | Admitting: Physical Therapy

## 2014-09-03 ENCOUNTER — Ambulatory Visit: Payer: BLUE CROSS/BLUE SHIELD | Admitting: Occupational Therapy

## 2014-09-03 ENCOUNTER — Encounter: Payer: Self-pay | Admitting: Occupational Therapy

## 2014-09-03 ENCOUNTER — Ambulatory Visit: Payer: BLUE CROSS/BLUE SHIELD

## 2014-09-03 VITALS — BP 140/103

## 2014-09-03 DIAGNOSIS — I69398 Other sequelae of cerebral infarction: Secondary | ICD-10-CM | POA: Diagnosis not present

## 2014-09-03 DIAGNOSIS — IMO0002 Reserved for concepts with insufficient information to code with codable children: Secondary | ICD-10-CM

## 2014-09-03 DIAGNOSIS — G8194 Hemiplegia, unspecified affecting left nondominant side: Secondary | ICD-10-CM

## 2014-09-03 NOTE — Therapy (Signed)
[facility] [address] [place], [place], [address] Phone: [phone]   Fax:  [phone]  Occupational Therapy Treatment  Patient Details  Name: Samuel Shaw MRN: [id number] Date of Birth: 09/22/1960 Referring Provider:  Charlett Blake, MD  Encounter Date: 09/03/2014      OT End of Session - 09/03/14 1616    Visit Number 8   Number of Visits 16   Date for OT Re-Evaluation 09/29/14   Authorization Type self pay   OT Start Time 1533   OT Stop Time 1600   OT Time Calculation (min) 27 min   Equipment Utilized During Treatment physioball   Activity Tolerance Treatment limited secondary to medical complications (Comment)  BP- session ended with BP 140/103 per protocol   Behavior During Therapy [facility] for tasks assessed/performed      Past Medical History  Diagnosis Date  . Stroke     History reviewed. No pertinent past surgical history.  BP 140/103 mmHg  Visit Diagnosis:  Hemiplegia affecting left nondominant side  Left hemiparesis  Lack of coordination due to stroke      Subjective Assessment - 09/03/14 1609    Symptoms I have made a whole lot of progress   Pertinent History see epic snapshot, monitor BP   Currently in Pain? Yes   Pain Score 0-No pain                 OT Treatments/Exercises (OP) - 09/03/14 0001    Neurological Re-education Exercises   Other Exercises 1 Standing with emphasis on active weight shift onto left lower extremity.  Progressed to overhead reaching to left encouraging trunk active extension, weight shift onto active left leg, and controlled reach of upper extremity.     Other Exercises 2 Reaching from floor (on right) to overhead (on left)  Also worked on single leg stance on left, while holding large ball with bilateral upper extremities to step up high (chair height) with right leg.  Patient somewhat anxious about this task initially, but completed without  difficulty - needing consistent cueing to stand strong on left knee - full knee extension.     Functional Reaching Activities   High Level Able to reach overhead, difficulty sustaining this position.  Compensates with backward weight shift (slight)                  OT Education - 09/03/14 1615    Education provided Yes   Education Details Left lower extremity activation and its impact on upper extremity functioning   Person(s) Educated Patient   Methods Explanation   Comprehension Verbalized understanding          OT Short Term Goals - 09/01/14 1723    OT SHORT TERM GOAL #1   Title Pt will be mod I with HEP - 09/01/2014   Status Achieved   OT SHORT TERM GOAL #2   Title Pt will be mod I with cutting with AE prn   Status Achieved   OT SHORT TERM GOAL #3   Title Pt will be mod I with bathing   Status Achieved   OT SHORT TERM GOAL #4   Title Pt will be mod with toilet transfers   Status Achieved   OT SHORT TERM GOAL #5   Title Pt will be mod I with shower transfers  wife does not feel comfortable with mod I transfers   Status On-going   OT SHORT TERM GOAL #6  Title Pt will be able to reach for 3 pound object on shelf with LUE at mid level   Status Achieved   OT SHORT TERM GOAL #7   Title Pt will require min a for basic organization for meal prep   Status Achieved           OT Long Term Goals - 09/01/14 1724    OT LONG TERM GOAL #1   Title Pt will be mod I with upgraded HEP - 09/29/2014   Status On-going   OT LONG TERM GOAL #2   Title Pt will be able to reach at high reach level to obtain 3 pound object   Status On-going   OT LONG TERM GOAL #3   Title Pt will demonstrate improved coodination as evidenced by decreasing time on 9 hole peg test by 10 seconds to assist with functional tasks (baseline= 1.00.78)   Status On-going   OT LONG TERM GOAL #4   Title Pt will improve in functional use of LUE as evidenced by increasing box and blocks by at least 5 blocks  (baseline= 33)   Status On-going   OT LONG TERM GOAL #5   Title Pt will be mod I for simple meal prep at ambulatory level   Status On-going               Plan - 09/03/14 1618    Clinical Impression Statement Patient progressing toward long term goals despite BP issues.     Pt will benefit from skilled therapeutic intervention in order to improve on the following deficits (Retired) Cardiopulmonary status limiting activity;Decreased activity tolerance;Decreased balance;Impaired UE functional use;Decreased strength;Decreased mobility;Decreased coordination;Decreased cognition   Rehab Potential Good   Clinical Impairments Affecting Rehab Potential cognition, left hemiplegia   OT Frequency 2x / week   OT Duration 8 weeks   OT Treatment/Interventions Self-care/ADL training;Moist Heat;Therapeutic exercise;Neuromuscular education;DME and/or AE instruction;Manual Therapy;Functional Mobility Training;Passive range of motion;Therapeutic activities;Cognitive remediation/compensation;Visual/perceptual remediation/compensation;Patient/family education;Balance training   Consulted and Agree with Plan of Care Patient        Problem List Patient Active Problem List   Diagnosis Date Noted  . Hemi-neglect of left side 07/16/2014  . Spastic hemiplegia affecting nondominant side 07/10/2014  . Cognitive deficit, post-stroke 07/10/2014  . Sinus bradycardia on ECG 07/10/2014  . 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 CVA (cerebral vascular accident)   . Tobacco abuse   . HLD (hyperlipidemia)   . CVA (cerebral infarction) 07/06/2014    Mariah Milling, OTR/L 09/03/2014, 4:20 PM  [facility] [address] [place], [place], [address] Phone: [phone]   Fax:  [phone]

## 2014-09-08 ENCOUNTER — Telehealth: Payer: Self-pay | Admitting: *Deleted

## 2014-09-08 ENCOUNTER — Ambulatory Visit: Payer: BLUE CROSS/BLUE SHIELD | Admitting: Occupational Therapy

## 2014-09-08 ENCOUNTER — Encounter: Payer: Self-pay | Admitting: Occupational Therapy

## 2014-09-08 ENCOUNTER — Ambulatory Visit: Payer: BLUE CROSS/BLUE SHIELD

## 2014-09-08 VITALS — BP 132/97

## 2014-09-08 DIAGNOSIS — R4189 Other symptoms and signs involving cognitive functions and awareness: Secondary | ICD-10-CM

## 2014-09-08 DIAGNOSIS — G8194 Hemiplegia, unspecified affecting left nondominant side: Secondary | ICD-10-CM

## 2014-09-08 DIAGNOSIS — R262 Difficulty in walking, not elsewhere classified: Secondary | ICD-10-CM

## 2014-09-08 DIAGNOSIS — I69398 Other sequelae of cerebral infarction: Secondary | ICD-10-CM | POA: Diagnosis not present

## 2014-09-08 DIAGNOSIS — IMO0002 Reserved for concepts with insufficient information to code with codable children: Secondary | ICD-10-CM

## 2014-09-08 NOTE — Therapy (Signed)
[facility] [address] [place], [place], [address] Phone: [phone]   Fax:  [phone]  Physical Therapy Treatment  Patient Details  Name: Samuel Shaw MRN: [id number] Date of Birth: 13-Nov-1960 Referring Provider:  Charlett Blake, MD  Encounter Date: 09/08/2014      PT End of Session - 09/08/14 1617    Visit Number 8   Number of Visits 17   Date for PT Re-Evaluation 10/03/14   Authorization Type self pay   PT Start Time 1535   PT Stop Time 1615   PT Time Calculation (min) 40 min      Past Medical History  Diagnosis Date  . Stroke     No past surgical history on file.  There were no vitals taken for this visit.  Visit Diagnosis:  Lack of coordination due to stroke  Difficulty walking  Left hemiparesis      Subjective Assessment - 09/08/14 1545    Symptoms Although pt's blood pressure has been inconsistent hypo and hypertensive, he reports no symptoms with the fluctuations   Currently in Pain? No/denies        Baseline BP 123/86 mmHg  Single leg stance activities BP137/91 mmHg HR 71 BPM  Gait training with single point cane  2x75' and in parallel bars with emphasis on increasing LLE knee and hip extension in stance and decreasing posterior lean in LLE swing phase. Verbal and visual cues utilized.  Single limb minisquats 2x10 with LLE with manual assist to derotate pelvis/L hip  and tactile cues to promote quads and gluts activation  BP 138/94 mmHg and HR 73 BPM at end of session.                       PT Short Term Goals - 09/01/14 1634    PT SHORT TERM GOAL #1   Title Demonstrate correct performance of home exercise program to address balance, gait and strength impairments. Target: 09/04/14   Status Achieved   PT SHORT TERM GOAL #2   Title Increase Berg balance test score to 40/56 for improving balance.  Target: 09/04/14   Baseline met 09/01/14- 48/56   Status  Achieved   PT SHORT TERM GOAL #3   Title Decrease TUG time to 35 seconds for improving efficiency with functional mobility with LRAD.  Target: 09/04/14   Baseline met 09/01/14- 18.0 sec's   Status Achieved   PT SHORT TERM GOAL #4   Title Begin gait training with either a single point cane or without an assistive device (with AFO) as appropriate.  Target: 09/04/14   Baseline met by 09/01/14   Status Achieved   PT SHORT TERM GOAL #5   Title Demonstrate ability to stand x5 minutes unsupported with upper extremity task for increased activity tolerance.  Target: 09/04/14   Baseline met 09/01/14   Status Achieved           PT Long Term Goals - 09/01/14 1635    PT LONG TERM GOAL #1   Title Verbalize understanding of signs and risk factors of CVA.  Target: 10/03/14   PT LONG TERM GOAL #2   Title Increase Berg Balance Test score to 47/56 for decreased fall risk.  Target: 10/03/14   Baseline met 09/01/14- 48/56   Status Achieved   PT LONG TERM GOAL #3   Title Ambulate 500' on indoor level surfaces without assistive device independently for increased independence with household ambulation. Target: 10/03/14  PT LONG TERM GOAL #4   Title Increase gait speed without assistive device to >1.8 ft/sec for decreased fall risk. Target: 10/03/14   PT LONG TERM GOAL #5   Title Perform TUG in <30 seconds without assistive device with safe turn, independently, for increased safety with household mobility. Target: 10/03/14   PT LONG TERM GOAL #6   Title Increase FOTO stroke impact mobility scale to 70% for decreased disability due to CVA. Target: 10/03/14               Plan - 09/08/14 1623    Clinical Impression Statement Pt is making great progress. His blood pressure was elevated today but not outside of safe therapeutic range. Able to tolerate entire session with rest breaks provided to maintain stable vitales.  Continue per plan of care.   PT Next Visit Plan continue to monitor BP,  add single limb squats to HEP,  continue with gait training with cane with emphasis on LLE knee and hip extension in stance phase and inhibit retro lean in LLE swing   Consulted and Agree with Plan of Care Patient        Problem List Patient Active Problem List   Diagnosis Date Noted  . Hemi-neglect of left side 07/16/2014  . Spastic hemiplegia affecting nondominant side 07/10/2014  . Cognitive deficit, post-stroke 07/10/2014  . Sinus bradycardia on ECG 07/10/2014  . 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 CVA (cerebral vascular accident)   . Tobacco abuse   . HLD (hyperlipidemia)   . CVA (cerebral infarction) 07/06/2014    Delrae Sawyers, PT,DPT,NCS 09/08/2014 4:34 PM Phone [phone] FAX [phone]         [facility] [address] [place], [place], [address] Phone: [phone]   Fax:  [phone]

## 2014-09-08 NOTE — Therapy (Signed)
[facility] [address] [place], [place], [address] Phone: [phone]   Fax:  [phone]  Occupational Therapy Treatment  Patient Details  Name: Samuel Shaw MRN: [id number] Date of Birth: [birth year] Referring Provider:  Charlett Blake, MD  Encounter Date: 09/08/2014      OT End of Session - 09/08/14 1701    Visit Number 9   Number of Visits 16   Date for OT Re-Evaluation 09/29/14   OT Start Time 1616   OT Stop Time 1658   OT Time Calculation (min) 42 min   Activity Tolerance Patient tolerated treatment well      Past Medical History  Diagnosis Date  . Stroke     History reviewed. No pertinent past surgical history.  BP 132/97 mmHg  Visit Diagnosis:  Hemiplegia affecting left nondominant side  Impaired cognition      Subjective Assessment - 09/08/14 1619    Symptoms I feel stronger   Pertinent History see epic snapshot, monitor BP   Currently in Pain? No/denies                 OT Treatments/Exercises (OP) - 09/08/14 0001    Exercises   Exercises Hand   Hand Exercises   Theraputty Flatten;Roll;Grip;Pinch;Locate Pegs  exercises with red putty; 20 pegs with green putty   Other Hand Exercises Pt able to complete three reps of each theraputty exercise with red putty (see pt instructions). Pt required repetition for new learning due to cognitive deficits. Pt able to return demonstrate after instruction and repetition.                OT Education - 09/08/14 1646    Education provided Yes   Education Details theraputty exercies - green for LUE   Person(s) Educated Patient   Methods Explanation;Demonstration;Tactile cues;Verbal cues;Handout   Comprehension Verbalized understanding;Returned demonstration          OT Short Term Goals - 09/08/14 1706    OT SHORT TERM GOAL #1   Title Pt will be mod I with HEP - 09/01/2014   Status Achieved   OT SHORT TERM GOAL #2   Title  Pt will be mod I with cutting with AE prn   Status Achieved   OT SHORT TERM GOAL #3   Title Pt will be mod I with bathing   Status Achieved   OT SHORT TERM GOAL #4   Title Pt will be mod with toilet transfers   Status Achieved   OT SHORT TERM GOAL #5   Title Pt will be mod I with shower transfers  wife does not feel comfortable with mod I transfers   Status On-going   OT SHORT TERM GOAL #6   Title Pt will be able to reach for 3 pound object on shelf with LUE at mid level   Status Achieved   OT SHORT TERM GOAL #7   Title Pt will require min a for basic organization for meal prep   Status Achieved           OT Long Term Goals - 09/08/14 1706    OT LONG TERM GOAL #1   Title Pt will be mod I with upgraded HEP - 09/29/2014   Status On-going   OT LONG TERM GOAL #2   Title Pt will be able to reach at high reach level to obtain 3 pound object   Status On-going   OT LONG TERM GOAL #3   Title  Pt will demonstrate improved coodination as evidenced by decreasing time on 9 hole peg test by 10 seconds to assist with functional tasks (baseline= 1.00.78)   Status On-going   OT LONG TERM GOAL #4   Title Pt will improve in functional use of LUE as evidenced by increasing box and blocks by at least 5 blocks (baseline= 33)   Status On-going   OT LONG TERM GOAL #5   Title Pt will be mod I for simple meal prep at ambulatory level   Status On-going               Plan - 09/08/14 1702    Clinical Impression Statement Pt making good progress. Pt to see primary MD again to address BP issue.   Pt will benefit from skilled therapeutic intervention in order to improve on the following deficits (Retired) Cardiopulmonary status limiting activity;Decreased activity tolerance;Decreased balance;Impaired UE functional use;Decreased strength;Decreased mobility;Decreased coordination;Decreased cognition   Rehab Potential Good   Clinical Impairments Affecting Rehab Potential cognition, left hemiplegia    OT Frequency 2x / week   OT Duration 8 weeks   OT Treatment/Interventions Self-care/ADL training;Moist Heat;Therapeutic exercise;Neuromuscular education;DME and/or AE instruction;Manual Therapy;Functional Mobility Training;Passive range of motion;Therapeutic activities;Cognitive remediation/compensation;Visual/perceptual remediation/compensation;Patient/family education;Balance training   Plan check BP, check HEP, standing balance with overhead reach.   Consulted and Agree with Plan of Care Patient   Family Member Consulted wife        Problem List Patient Active Problem List   Diagnosis Date Noted  . Hemi-neglect of left side 07/16/2014  . Spastic hemiplegia affecting nondominant side 07/10/2014  . Cognitive deficit, post-stroke 07/10/2014  . Sinus bradycardia on ECG 07/10/2014  . 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 CVA (cerebral vascular accident)   . Tobacco abuse   . HLD (hyperlipidemia)   . CVA (cerebral infarction) 07/06/2014    Quay Burow, OTR/L 09/08/2014, 5:08 PM  [facility] [address] [place], [place], [address] Phone: [phone]   Fax:  [phone]

## 2014-09-08 NOTE — Patient Instructions (Signed)
Theraputty exercises for home to strengthen your hand: Do 1-2 times per day. STOP if you get pain and let your therapist know.  1.  Make a ball 2.  Make a pancake 3.  Make a cone Repeat sequence 5 times  4. Make a fat hot dog. Squeeze as hard as you can. Do 10 times 5. Pull taffy. Do 10 times 6. Ring around the fingers. Do 10 times 7. 2 point pinch. Do 5 times 8.  3 point pinch. Do 5 times 9.  Lateral pinch. Do 5 times

## 2014-09-08 NOTE — Telephone Encounter (Signed)
I left message for Mr Longsworth to make sure he follows up with Dr Berdine Addison about his BP issues related to his therapy(I believe he is at PT currently).  I also contacted Dr Cathey Endow office and they have him coming in to see [name] tomorrow 09/09/14

## 2014-09-10 ENCOUNTER — Ambulatory Visit: Payer: BLUE CROSS/BLUE SHIELD

## 2014-09-10 ENCOUNTER — Ambulatory Visit: Payer: BLUE CROSS/BLUE SHIELD | Admitting: Occupational Therapy

## 2014-09-10 DIAGNOSIS — G8194 Hemiplegia, unspecified affecting left nondominant side: Secondary | ICD-10-CM

## 2014-09-10 DIAGNOSIS — Z7409 Other reduced mobility: Secondary | ICD-10-CM

## 2014-09-10 DIAGNOSIS — I69398 Other sequelae of cerebral infarction: Secondary | ICD-10-CM | POA: Diagnosis not present

## 2014-09-10 NOTE — Therapy (Signed)
[facility] [address] [place], [place], [address] Phone: [phone]   Fax:  [phone]  Physical Therapy Treatment  Patient Details  Name: Samuel Shaw MRN: [id number] Date of Birth: [birth year] Referring Provider:  Charlett Blake, MD  Encounter Date: 09/10/2014      PT End of Session - 09/10/14 1609    Visit Number 9   Number of Visits 17   Date for PT Re-Evaluation 10/03/14   Authorization Type self pay   PT Start Time 1535   PT Stop Time 1600   PT Time Calculation (min) 25 min      Past Medical History  Diagnosis Date  . Stroke     No past surgical history on file.  There were no vitals filed for this visit.  Visit Diagnosis:  Left hemiparesis  Impaired functional mobility and activity tolerance      Subjective Assessment - 09/10/14 1548    Symptoms Pt's wife is concerned about the swelling in the LLE. I inspected it and pt isn't reporting lower leg pain      Observed pt's LLE. Swelling appears to be typical of hemiparetic lower extremities and pt does not have pain, warmth or redness in the lower limb. He reports that he had pain in the L inner thigh last night (cramping) and his wife worried that he may need to go to the ER. Pt is no longer having this pain. Therapist recommended hip adductor stretching especially before bed as the adductors are tight.   Therex: Pt was stretched passively by therapist then taught how to actively stretch the hip adductor in hooklying. Blood pressure: 121/21mHg; HR 55 BPM   Pt performed single limb minisquats 3x10 and instructed to perform this for HEP. Handout provided.  Blood pressure: 136/112 Pulse 52 BPM  Session was terminated. Therapist discussed with pt and wife recommendation to hold therapy until blood pressure is managed. An EPIC message is being sent to pt's physician about this. Pt to resume once BP is stable x1-2 weeks. Pt to monitor  vitals with activity and was instructed not to perform HEP if the diastolic BP is over [id number]                        PT Education - 09/10/14 1608    Education provided Yes   Education Details Try hip adductor stretch at night before going to bed to see if it relieves the cramps at night. If they persist for 3 days, he may want to seek medical opinion; also provided single limb squat HEP   Person(s) Educated Patient;Spouse   Methods Explanation;Demonstration;Tactile cues;Verbal cues;Handout   Comprehension Verbalized understanding;Returned demonstration          PT Short Term Goals - 09/01/14 1634    PT SHORT TERM GOAL #1   Title Demonstrate correct performance of home exercise program to address balance, gait and strength impairments. Target: 09/04/14   Status Achieved   PT SHORT TERM GOAL #2   Title Increase Berg balance test score to 40/56 for improving balance.  Target: 09/04/14   Baseline met 09/01/14- 48/56   Status Achieved   PT SHORT TERM GOAL #3   Title Decrease TUG time to 35 seconds for improving efficiency with functional mobility with LRAD.  Target: 09/04/14   Baseline met 09/01/14- 18.0 sec's   Status Achieved   PT SHORT TERM GOAL #4   Title Begin gait training with either  a single point cane or without an assistive device (with AFO) as appropriate.  Target: 09/04/14   Baseline met by 09/01/14   Status Achieved   PT SHORT TERM GOAL #5   Title Demonstrate ability to stand x5 minutes unsupported with upper extremity task for increased activity tolerance.  Target: 09/04/14   Baseline met 09/01/14   Status Achieved           PT Long Term Goals - 09/01/14 1635    PT LONG TERM GOAL #1   Title Verbalize understanding of signs and risk factors of CVA.  Target: 10/03/14   PT LONG TERM GOAL #2   Title Increase Berg Balance Test score to 47/56 for decreased fall risk.  Target: 10/03/14   Baseline met 09/01/14- 48/56   Status Achieved   PT LONG TERM GOAL #3   Title  Ambulate 500' on indoor level surfaces without assistive device independently for increased independence with household ambulation. Target: 10/03/14   PT LONG TERM GOAL #4   Title Increase gait speed without assistive device to >1.8 ft/sec for decreased fall risk. Target: 10/03/14   PT LONG TERM GOAL #5   Title Perform TUG in <30 seconds without assistive device with safe turn, independently, for increased safety with household mobility. Target: 10/03/14   PT LONG TERM GOAL #6   Title Increase FOTO stroke impact mobility scale to 70% for decreased disability due to CVA. Target: 10/03/14               Plan - 09/10/14 1610    Clinical Impression Statement Pt's blood pressure elevated to 136/112 after single limb squats and session was terminated as this is too high for activity. He plans to rest for 15 minutes with hopes of participating in OT.   PT Next Visit Plan Hold therapy until blood pressure is stable. Continue to monitor BP, ask if hip adductor stretch was beneficial. Gait training with cane.        Problem List Patient Active Problem List   Diagnosis Date Noted  . Hemi-neglect of left side 07/16/2014  . Spastic hemiplegia affecting nondominant side 07/10/2014  . Cognitive deficit, post-stroke 07/10/2014  . Sinus bradycardia on ECG 07/10/2014  . 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 CVA (cerebral vascular accident)   . Tobacco abuse   . HLD (hyperlipidemia)   . CVA (cerebral infarction) 07/06/2014   Delrae Sawyers, PT,DPT,NCS 09/10/2014 4:38 PM Phone [phone] FAX [phone]         [facility] [address] [place], [place], [address] Phone: [phone]   Fax:  [phone]

## 2014-09-10 NOTE — Patient Instructions (Signed)
Mini Squat: Single Leg   Hold onto a chair or counter as needed for balance. Stand on right foot. Reach forward for balance and do a mini squat. Keep knees in line with second toe. Knees do not go past toes. Keep knees apart. Repeat 10 times.  Rest 10 seconds after set. Do 3 sets per session. One session per day.   http://plyo.exer.us/72   Copyright  VHI. All rights reserved.

## 2014-09-10 NOTE — Therapy (Signed)
[facility] [address] [place], [place], [address] Phone: [phone]   Fax:  [phone]  Occupational Therapy Treatment  Patient Details  Name: Samuel Shaw MRN: [id number] Date of Birth: December 11, 1960 Referring Provider:  Charlett Blake, MD  Encounter Date: 09/10/2014    Past Medical History  Diagnosis Date  . Stroke     No past surgical history on file.  There were no vitals filed for this visit.  Visit Diagnosis:  Hemiplegia affecting left nondominant side  Session terminated due to high BP.  Discussed options with wife and pt and decision has been made to place this patient on hold until his blood pressure is more stable. MD was notified of this decision and Dr. Criss Rosales is currently working with pt to try and stabilize his high BP. BP today rose to 136/112 and HR dropped from 55 to 52.  Wife will stay in contact and will resume therapy once pt is medically stable.                           OT Short Term Goals - 09/08/14 1706    OT SHORT TERM GOAL #1   Title Pt will be mod I with HEP - 09/01/2014   Status Achieved   OT SHORT TERM GOAL #2   Title Pt will be mod I with cutting with AE prn   Status Achieved   OT SHORT TERM GOAL #3   Title Pt will be mod I with bathing   Status Achieved   OT SHORT TERM GOAL #4   Title Pt will be mod with toilet transfers   Status Achieved   OT SHORT TERM GOAL #5   Title Pt will be mod I with shower transfers  wife does not feel comfortable with mod I transfers   Status On-going   OT SHORT TERM GOAL #6   Title Pt will be able to reach for 3 pound object on shelf with LUE at mid level   Status Achieved   OT SHORT TERM GOAL #7   Title Pt will require min a for basic organization for meal prep   Status Achieved           OT Long Term Goals - 09/08/14 1706    OT LONG TERM GOAL #1   Title Pt will be mod I with upgraded HEP - 09/29/2014   Status  On-going   OT LONG TERM GOAL #2   Title Pt will be able to reach at high reach level to obtain 3 pound object   Status On-going   OT LONG TERM GOAL #3   Title Pt will demonstrate improved coodination as evidenced by decreasing time on 9 hole peg test by 10 seconds to assist with functional tasks (baseline= 1.00.78)   Status On-going   OT LONG TERM GOAL #4   Title Pt will improve in functional use of LUE as evidenced by increasing box and blocks by at least 5 blocks (baseline= 33)   Status On-going   OT LONG TERM GOAL #5   Title Pt will be mod I for simple meal prep at ambulatory level   Status On-going               Problem List Patient Active Problem List   Diagnosis Date Noted  . Hemi-neglect of left side 07/16/2014  . Spastic hemiplegia affecting nondominant side 07/10/2014  . Cognitive deficit, post-stroke 07/10/2014  .  Sinus bradycardia on ECG 07/10/2014  . 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 CVA (cerebral vascular accident)   . Tobacco abuse   . HLD (hyperlipidemia)   . CVA (cerebral infarction) 07/06/2014    Quay Burow, OTR/L 09/10/2014, 4:58 PM  [facility] [address] [place], [place], [address] Phone: [phone]   Fax:  [phone]

## 2014-09-15 ENCOUNTER — Ambulatory Visit: Payer: BLUE CROSS/BLUE SHIELD | Admitting: Occupational Therapy

## 2014-09-15 ENCOUNTER — Ambulatory Visit: Payer: BLUE CROSS/BLUE SHIELD

## 2014-09-17 ENCOUNTER — Ambulatory Visit: Payer: BLUE CROSS/BLUE SHIELD

## 2014-09-17 ENCOUNTER — Encounter: Payer: BLUE CROSS/BLUE SHIELD | Admitting: Occupational Therapy

## 2014-09-22 ENCOUNTER — Ambulatory Visit: Payer: BLUE CROSS/BLUE SHIELD | Admitting: Occupational Therapy

## 2014-09-22 ENCOUNTER — Telehealth: Payer: Self-pay | Admitting: Occupational Therapy

## 2014-09-22 ENCOUNTER — Ambulatory Visit: Payer: BLUE CROSS/BLUE SHIELD

## 2014-09-22 NOTE — Telephone Encounter (Signed)
Pt was scheduled for therapy today however has been on hold. Pt and wife have been working with MD for better BP control and pt and wife were instructed to monitor BP daily before and after activity and if BP stayed stable for one week plan was for pt to return to therapy. Pt did not show today therefore called pt and wife. Wife stated pt sees MD tomorrow and she has been monitoring BP with pt at home and will take those readings into MD. Per wife, as long as MD has no objections and BP has been stable, pt to return to next appointment.  Confirmed next appointment time with wife.

## 2014-09-24 ENCOUNTER — Encounter: Payer: Self-pay | Admitting: Occupational Therapy

## 2014-09-24 ENCOUNTER — Ambulatory Visit: Payer: BLUE CROSS/BLUE SHIELD

## 2014-09-24 ENCOUNTER — Ambulatory Visit: Payer: BLUE CROSS/BLUE SHIELD | Admitting: Occupational Therapy

## 2014-09-24 VITALS — BP 119/82

## 2014-09-24 VITALS — BP 134/88 | HR 50

## 2014-09-24 DIAGNOSIS — R262 Difficulty in walking, not elsewhere classified: Secondary | ICD-10-CM

## 2014-09-24 DIAGNOSIS — Z7409 Other reduced mobility: Secondary | ICD-10-CM

## 2014-09-24 DIAGNOSIS — G8194 Hemiplegia, unspecified affecting left nondominant side: Secondary | ICD-10-CM

## 2014-09-24 DIAGNOSIS — I69398 Other sequelae of cerebral infarction: Secondary | ICD-10-CM | POA: Diagnosis not present

## 2014-09-24 NOTE — Therapy (Signed)
[facility] [address] [place], [place], [address] Phone: [phone]   Fax:  [phone]  Physical Therapy Treatment  Patient Details  Name: Samuel Shaw MRN: [id number] Date of Birth: 12/01/1960 Referring Provider:  Kirsteins, Andrew E, MD  Encounter Date: 09/24/2014      PT End of Session - 09/24/14 1646    Visit Number 10   Number of Visits 17   Date for PT Re-Evaluation 10/03/14   Authorization Type self pay   PT Start Time 1446   PT Stop Time 1530   PT Time Calculation (min) 44 min   Equipment Utilized During Treatment Gait belt   Activity Tolerance Patient tolerated treatment well      Past Medical History  Diagnosis Date  . Stroke     No past surgical history on file.  Filed Vitals:   09/24/14 1448 09/24/14 1515 09/24/14 1530 09/24/14 1543  BP: 134/86 145/87 127/91 134/88  Pulse: 50 64 82 50    Visit Diagnosis:  Left hemiparesis  Difficulty walking      Subjective Assessment - 09/24/14 1645    Symptoms Pt reports no pain and that his blood pressure has been stable during exercises. Doctor cleared him to return to therapy but to let the doctor know if blood pressure becomes unstable again   Currently in Pain? No/denies     *see vitals log for all blood pressure monitoring in session today  Therex: 3x10 LLE hip adduction against gravity 3x10 LLE hamstring curls in prone with tactile and verbal cues to inhibit hip flexion (attempts at synergy) 3x10 glut kickbacks with occasional MIN A to maintain neutral pelvis and assist with knee flexion  Upon rising from therapy mat pt noted to have unequal weight bearing through BLE for sit to stand (less weight on LLE)  Gait training: x100' and 230' with single point cane and close supervision pt noted to have LLE excess abduction of hip, decreased knee flexion in initial swing, decreased knee extension in loading response->midstance in LLE.    Performed part to whole gait training practice in parallel bars with mirror and verbal cues to address each of the deficits above.                            PT Short Term Goals - 09/01/14 1634    PT SHORT TERM GOAL #1   Title Demonstrate correct performance of home exercise program to address balance, gait and strength impairments. Target: 09/04/14   Status Achieved   PT SHORT TERM GOAL #2   Title Increase Berg balance test score to 40/56 for improving balance.  Target: 09/04/14   Baseline met 09/01/14- 48/56   Status Achieved   PT SHORT TERM GOAL #3   Title Decrease TUG time to 35 seconds for improving efficiency with functional mobility with LRAD.  Target: 09/04/14   Baseline met 09/01/14- 18.0 sec's   Status Achieved   PT SHORT TERM GOAL #4   Title Begin gait training with either a single point cane or without an assistive device (with AFO) as appropriate.  Target: 09/04/14   Baseline met by 09/01/14   Status Achieved   PT SHORT TERM GOAL #5   Title Demonstrate ability to stand x5 minutes unsupported with upper extremity task for increased activity tolerance.  Target: 09/04/14   Baseline met 09/01/14   Status Achieved             PT Long Term Goals - 09/01/14 1635    PT LONG TERM GOAL #1   Title Verbalize understanding of signs and risk factors of CVA.  Target: 10/03/14   PT LONG TERM GOAL #2   Title Increase Berg Balance Test score to 47/56 for decreased fall risk.  Target: 10/03/14   Baseline met 09/01/14- 48/56   Status Achieved   PT LONG TERM GOAL #3   Title Ambulate 500' on indoor level surfaces without assistive device independently for increased independence with household ambulation. Target: 10/03/14   PT LONG TERM GOAL #4   Title Increase gait speed without assistive device to >1.8 ft/sec for decreased fall risk. Target: 10/03/14   PT LONG TERM GOAL #5   Title Perform TUG in <30 seconds without assistive device with safe turn, independently, for increased safety  with household mobility. Target: 10/03/14   PT LONG TERM GOAL #6   Title Increase FOTO stroke impact mobility scale to 70% for decreased disability due to CVA. Target: 10/03/14               Plan - 09/24/14 1646    Clinical Impression Statement Pt had more stable vitals with activity today. Gait pattern is improving. Balance has significantly improved but will continue to benefit from skilled PT services to maximize functional independence.   PT Next Visit Plan continue gait training; emphasis on single limb stance control begin checking long term goals and renew        Problem List Patient Active Problem List   Diagnosis Date Noted  . Hemi-neglect of left side 07/16/2014  . Spastic hemiplegia affecting nondominant side 07/10/2014  . Cognitive deficit, post-stroke 07/10/2014  . Sinus bradycardia on ECG 07/10/2014  . 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 CVA (cerebral vascular accident)   . Tobacco abuse   . HLD (hyperlipidemia)   . CVA (cerebral infarction) 07/06/2014    , PT,DPT,NCS 09/24/2014 4:49 PM Phone [phone] FAX [phone]         [facility] [address] [place], , [address] Phone: [phone]   Fax:  [phone]      

## 2014-09-24 NOTE — Therapy (Signed)
[facility] [address] [place], [place], [address] Phone: [phone]   Fax:  [phone]  Occupational Therapy Treatment  Patient Details  Name: Samuel Shaw MRN: [id number] Date of Birth: 07-Dec-1960 Referring Provider:  Charlett Blake, MD  Encounter Date: 09/24/2014      OT End of Session - 09/24/14 1727    Visit Number 10   Number of Visits 16   Date for OT Re-Evaluation 09/29/14   OT Start Time 1950   OT Stop Time 1658   OT Time Calculation (min) 41 min   Activity Tolerance Patient tolerated treatment well      Past Medical History  Diagnosis Date  . Stroke     History reviewed. No pertinent past surgical history.  Filed Vitals:   09/24/14 1723  BP: 119/82    Visit Diagnosis:  Hemiplegia affecting left nondominant side  Impaired functional mobility and activity tolerance      Subjective Assessment - 09/24/14 1622    Symptoms I have been taking my BP at home. (Pt working with [name] for BP control)   Pertinent History see epic snapshot, monitor BP   Currently in Pain? No/denies                    OT Treatments/Exercises (OP) - 09/24/14 0001    Neurological Re-education Exercises   Other Exercises 1 Pt with significant improvement in LUE function (see LTG's for updates). Neuro re ed for functional overhead reach with 2 and 3 pound objects with emphasis on standing alignment, increasing weightbearing on the LLE, overhead reach with more normal movement pattern, throwing ball with LUE in sitting to work on timing, control and grading.                   OT Short Term Goals - 09/24/14 1728    OT SHORT TERM GOAL #1   Title Pt will be mod I with HEP - 09/01/2014   Status Achieved   OT SHORT TERM GOAL #2   Title Pt will be mod I with cutting with AE prn   Status Achieved   OT SHORT TERM GOAL #3   Title Pt will be mod I with bathing   Status Achieved   OT SHORT  TERM GOAL #4   Title Pt will be mod with toilet transfers   Status Achieved   OT SHORT TERM GOAL #5   Title Pt will be mod I with shower transfers  wife does not feel comfortable with mod I transfers   Status On-going   OT SHORT TERM GOAL #6   Title Pt will be able to reach for 3 pound object on shelf with LUE at mid level   Status Achieved   OT SHORT TERM GOAL #7   Title Pt will require min a for basic organization for meal prep   Status Achieved           OT Long Term Goals - 09/24/14 1729    OT LONG TERM GOAL #1   Title Pt will be mod I with upgraded HEP - 09/29/2014   Status On-going   OT LONG TERM GOAL #2   Title Pt will be able to reach at high reach level to obtain 3 pound object   Status Achieved   OT LONG TERM GOAL #3   Title Pt will demonstrate improved coodination as evidenced by decreasing time on 9 hole peg test by 10 seconds  to assist with functional tasks (baseline= 1.00.78)   Status Achieved  42.47   OT LONG TERM GOAL #4   Title Pt will improve in functional use of LUE as evidenced by increasing box and blocks by at least 5 blocks (baseline= 33)   Status Achieved  L= 46   OT LONG TERM GOAL #5   Title Pt will be mod I for simple meal prep at ambulatory level   Status On-going               Plan - 09/24/14 1727    Clinical Impression Statement Pt with significant over all progress toward goals despite medically issue with BP.  Pt very motivated.   Pt will benefit from skilled therapeutic intervention in order to improve on the following deficits (Retired) Cardiopulmonary status limiting activity;Decreased activity tolerance;Decreased balance;Impaired UE functional use;Decreased strength;Decreased mobility;Decreased coordination;Decreased cognition   Rehab Potential Good   Clinical Impairments Affecting Rehab Potential cognition, left hemiplegia   OT Frequency 2x / week   OT Duration 8 weeks   OT Treatment/Interventions Self-care/ADL training;Moist  Heat;Therapeutic exercise;Neuromuscular education;DME and/or AE instruction;Manual Therapy;Functional Mobility Training;Passive range of motion;Therapeutic activities;Cognitive remediation/compensation;Visual/perceptual remediation/compensation;Patient/family education;Balance training   Plan neuro re ed for resistive overhead reach, balance,   Consulted and Agree with Plan of Care Patient        Problem List Patient Active Problem List   Diagnosis Date Noted  . Hemi-neglect of left side 07/16/2014  . Spastic hemiplegia affecting nondominant side 07/10/2014  . Cognitive deficit, post-stroke 07/10/2014  . Sinus bradycardia on ECG 07/10/2014  . 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 CVA (cerebral vascular accident)   . Tobacco abuse   . HLD (hyperlipidemia)   . CVA (cerebral infarction) 07/06/2014    Quay Burow, OTR/L 09/24/2014, 5:31 PM  [facility] [address] [place], [place], [address] Phone: [phone]   Fax:  [phone]

## 2014-09-28 ENCOUNTER — Ambulatory Visit: Payer: BLUE CROSS/BLUE SHIELD

## 2014-09-28 ENCOUNTER — Ambulatory Visit: Payer: BLUE CROSS/BLUE SHIELD | Admitting: Occupational Therapy

## 2014-09-28 ENCOUNTER — Telehealth: Payer: Self-pay | Admitting: Occupational Therapy

## 2014-09-28 VITALS — BP 136/90 | HR 49

## 2014-09-28 DIAGNOSIS — R262 Difficulty in walking, not elsewhere classified: Secondary | ICD-10-CM

## 2014-09-28 DIAGNOSIS — I69398 Other sequelae of cerebral infarction: Secondary | ICD-10-CM | POA: Diagnosis not present

## 2014-09-28 DIAGNOSIS — IMO0002 Reserved for concepts with insufficient information to code with codable children: Secondary | ICD-10-CM

## 2014-09-28 DIAGNOSIS — G8194 Hemiplegia, unspecified affecting left nondominant side: Secondary | ICD-10-CM

## 2014-09-28 NOTE — Therapy (Signed)
[facility] [address] [place], [place], [address] Phone: [phone]   Fax:  [phone]  Physical Therapy Treatment  Patient Details  Name: Samuel Shaw MRN: [id number] Date of Birth: Jan 13, 1961 Referring Provider:  Charlett Blake, MD  Encounter Date: 09/28/2014      PT End of Session - 09/28/14 1456    Visit Number 11   Number of Visits 17   Date for PT Re-Evaluation 10/03/14   Authorization Type get new insurance info-   PT Start Time 1407   PT Stop Time 1447   PT Time Calculation (min) 40 min      Past Medical History  Diagnosis Date  . Stroke     History reviewed. No pertinent past surgical history.  Filed Vitals:   09/28/14 1412 09/28/14 1420  BP: 124/86 136/90  Pulse: 49 49    Visit Diagnosis:  Lack of coordination due to stroke  Difficulty walking  Left hemiparesis      Subjective Assessment - 09/28/14 1411    Symptoms Pt has no pain or new complaints.      Emphasis of entire session on safety and pattern of gait.  Gait training x400' with supervision without assistive device, noted to have decreaed preswing bilateral, increased knee flexion in intial contact through midstance bilateral. Later again x100' with assist for neutral pelvis.  Prone alternate knee flexion/extension to determine if lack of reciprocal movement is the reason for pt's bilateral excess knee flexion in gait-- with good reciprocal movement noted although pt reported challenge; prone alternate hip extension with good reciprocal movement but compensations noted due to L glut weakness.  Gait training on treadmill with MIN/MOD A for LLE knee flexion in initial swing and extension in initial contact then with verbal cues for increased preswing push off with RLE x 10 minutes at speeds varying from 0.4 to 1.0 MPH with BUE support.   Bilateral heel raises x30 with single UE support and supervision for improved  sense of pushing off with toes for pre-swing, attempted to perform single limb heel raise with LLE but pt unable at this time due to muscle weakness.                    PT Short Term Goals - 09/01/14 1634    PT SHORT TERM GOAL #1   Title Demonstrate correct performance of home exercise program to address balance, gait and strength impairments. Target: 09/04/14   Status Achieved   PT SHORT TERM GOAL #2   Title Increase Berg balance test score to 40/56 for improving balance.  Target: 09/04/14   Baseline met 09/01/14- 48/56   Status Achieved   PT SHORT TERM GOAL #3   Title Decrease TUG time to 35 seconds for improving efficiency with functional mobility with LRAD.  Target: 09/04/14   Baseline met 09/01/14- 18.0 sec's   Status Achieved   PT SHORT TERM GOAL #4   Title Begin gait training with either a single point cane or without an assistive device (with AFO) as appropriate.  Target: 09/04/14   Baseline met by 09/01/14   Status Achieved   PT SHORT TERM GOAL #5   Title Demonstrate ability to stand x5 minutes unsupported with upper extremity task for increased activity tolerance.  Target: 09/04/14   Baseline met 09/01/14   Status Achieved           PT Long Term Goals - 09/01/14 1635    PT LONG  TERM GOAL #1   Title Verbalize understanding of signs and risk factors of CVA.  Target: 10/03/14   PT LONG TERM GOAL #2   Title Increase Berg Balance Test score to 47/56 for decreased fall risk.  Target: 10/03/14   Baseline met 09/01/14- 48/56   Status Achieved   PT LONG TERM GOAL #3   Title Ambulate 500' on indoor level surfaces without assistive device independently for increased independence with household ambulation. Target: 10/03/14   PT LONG TERM GOAL #4   Title Increase gait speed without assistive device to >1.8 ft/sec for decreased fall risk. Target: 10/03/14   PT LONG TERM GOAL #5   Title Perform TUG in <30 seconds without assistive device with safe turn, independently, for increased safety with  household mobility. Target: 10/03/14   PT LONG TERM GOAL #6   Title Increase FOTO stroke impact mobility scale to 70% for decreased disability due to CVA. Target: 10/03/14               Plan - 09/28/14 1459    Clinical Impression Statement Pt's vitals continue to be more stable. He demonstrated improved balance with ambulation without assistive device, now at supervision level on even surfaces. Continues to demonstarate gait impairments. Will renew next visit due to pt making progress but slowed down due to having to be on hold for hypertension earlier in the month.   PT Next Visit Plan Check long term goals and complete *renewal*        Problem List Patient Active Problem List   Diagnosis Date Noted  . Hemi-neglect of left side 07/16/2014  . Spastic hemiplegia affecting nondominant side 07/10/2014  . Cognitive deficit, post-stroke 07/10/2014  . Sinus bradycardia on ECG 07/10/2014  . 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 CVA (cerebral vascular accident)   . Tobacco abuse   . HLD (hyperlipidemia)   . CVA (cerebral infarction) 07/06/2014   Delrae Sawyers, PT,DPT,NCS 09/28/2014 3:06 PM Phone [phone] FAX [phone]         [facility] [address] [place], [place], [address] Phone: [phone]   Fax:  [phone]

## 2014-09-28 NOTE — Telephone Encounter (Signed)
Pt did not show for scheduled appointment for OT.  Called and left message to check to make sure patient was ok, to let him know he has also had a PT appointment today at 10:15 and to remind of his two therapy appointments tomorrow. Asked pt to call the center so we would know he was ok and to let us know if he would not be able to attend his appointments tomorrow.

## 2014-09-29 ENCOUNTER — Encounter: Payer: Self-pay | Admitting: Occupational Therapy

## 2014-09-29 ENCOUNTER — Ambulatory Visit: Payer: BLUE CROSS/BLUE SHIELD

## 2014-09-29 ENCOUNTER — Ambulatory Visit: Payer: BLUE CROSS/BLUE SHIELD | Admitting: Occupational Therapy

## 2014-09-29 DIAGNOSIS — Z7409 Other reduced mobility: Secondary | ICD-10-CM

## 2014-09-29 DIAGNOSIS — I69398 Other sequelae of cerebral infarction: Secondary | ICD-10-CM | POA: Diagnosis not present

## 2014-09-29 DIAGNOSIS — IMO0002 Reserved for concepts with insufficient information to code with codable children: Secondary | ICD-10-CM

## 2014-09-29 DIAGNOSIS — R262 Difficulty in walking, not elsewhere classified: Secondary | ICD-10-CM

## 2014-09-29 DIAGNOSIS — R4189 Other symptoms and signs involving cognitive functions and awareness: Secondary | ICD-10-CM

## 2014-09-29 DIAGNOSIS — G8194 Hemiplegia, unspecified affecting left nondominant side: Secondary | ICD-10-CM

## 2014-09-29 DIAGNOSIS — R414 Neurologic neglect syndrome: Secondary | ICD-10-CM

## 2014-09-29 NOTE — Therapy (Signed)
[facility] [address] [place], [place], [address] Phone: [phone]   Fax:  [phone]  Occupational Therapy Treatment  Patient Details  Name: Samuel Shaw MRN: [id number] Date of Birth: [birth year] Referring Provider:  Charlett Blake, MD  Encounter Date: 09/29/2014      OT End of Session - 09/29/14 1249    Visit Number 11   Number of Visits 16   Date for OT Re-Evaluation 09/29/14   Authorization Type pt is now on wife's insurance   OT Start Time 1035  pt arrived late due to flat tire   OT Stop Time 1105   OT Time Calculation (min) 30 min   Activity Tolerance Patient tolerated treatment well      Past Medical History  Diagnosis Date  . Stroke     History reviewed. No pertinent past surgical history.  There were no vitals filed for this visit.  Visit Diagnosis:  Hemiplegia affecting left nondominant side - Plan: Ot plan of care cert/re-cert  Impaired functional mobility and activity tolerance - Plan: Ot plan of care cert/re-cert  Lack of coordination due to stroke - Plan: Ot plan of care cert/re-cert  Impaired cognition - Plan: Ot plan of care cert/re-cert  Left-sided neglect - Plan: Ot plan of care cert/re-cert      Subjective Assessment - 09/29/14 1039    Symptoms  I see the dr tomorrow   Pertinent History see epic snapshot, monitor BP   Currently in Pain? No/denies                    OT Treatments/Exercises (OP) - 09/29/14 0001    ADLs   LB Dressing practiced putting on shoe with brace. Pt no longer needs shoe buttons therefore removed to allow easier access to shoe. Pt needed min a to don shoe/brace with shoe horn. Discussed dressing and pt now sharing with me that his wife dreses him in bed before he gets up.  Should be no clinical reason that pt is unable to dress himself. Encouraged pt to dress tomorrow with recommendation to sit to put on pants and undewear. Pt  verbalized understanding. Wife stills supervising shower transfers as it is hard for pt to step over edge of tub with no grab bar.   Neurological Re-education Exercises   Other Exercises 1 Neuro re ed to address transitional movements (side sitting to quadraped, tummy to quadraped), weight bearing in quadraped on LUE while reaching with RUE for activity to increase proximal strength.                   OT Short Term Goals - 09/29/14 1252    OT SHORT TERM GOAL #1   Title Pt will be mod I with HEP - 09/01/2014   Status Achieved   OT SHORT TERM GOAL #2   Title Pt will be mod I with cutting with AE prn   Status Achieved   OT SHORT TERM GOAL #3   Title Pt will be mod I with bathing   Status Achieved   OT SHORT TERM GOAL #4   Title Pt will be mod with toilet transfers   Status Achieved   OT SHORT TERM GOAL #5   Title Pt will be mod I with shower transfers  wife does not feel comfortable with mod I transfers   Status On-going   OT SHORT TERM GOAL #6   Title Pt will be able to reach for 3  pound object on shelf with LUE at mid level   Status Achieved   OT SHORT TERM GOAL #7   Title Pt will require min a for basic organization for meal prep   Status Achieved           OT Long Term Goals - 09/29/14 1253    OT LONG TERM GOAL #1   Title Pt will be mod I with upgraded HEP - 09/29/2014   Status Achieved   OT LONG TERM GOAL #2   Title Pt will be able to reach at high reach level to obtain 3 pound object   Status Achieved   OT LONG TERM GOAL #3   Title Pt will demonstrate improved coodination as evidenced by decreasing time on 9 hole peg test by 10 seconds to assist with functional tasks (baseline= 1.00.78)   Status Achieved  42.47   OT LONG TERM GOAL #4   Title Pt will improve in functional use of LUE as evidenced by increasing box and blocks by at least 5 blocks (baseline= 33)   Status Achieved  L= 46   OT LONG TERM GOAL #5   Title Pt will be mod I for simple meal prep at  ambulatory level - 10/27/2014   Status New   Long Term Additional Goals   Additional Long Term Goals Yes   OT LONG TERM GOAL #6   Title Pt will be mod I with upgraded HEP   Status New   OT LONG TERM GOAL #7   Title Pt will be able to lift 7 pound object into overhead cabinet with LUE   Status New   OT LONG TERM GOAL #8   Title Pt will be mod I with donning L shoe and brace   Status New   OT LONG TERM GOAL  #9   Baseline Pt will be mod I with shower transfers               Plan - 09/29/14 1251    Clinical Impression Statement Pt making good progress and will renew today to address proximal strength with over head reach, hand manipulation with overhread reach, and higher functional mobility   Pt will benefit from skilled therapeutic intervention in order to improve on the following deficits (Retired) Cardiopulmonary status limiting activity;Decreased activity tolerance;Decreased balance;Impaired UE functional use;Decreased strength;Decreased mobility;Decreased coordination;Decreased cognition   Rehab Potential Good   Clinical Impairments Affecting Rehab Potential cognition, left hemiplegia   OT Frequency 2x / week   OT Duration 4 weeks   OT Treatment/Interventions Self-care/ADL training;Moist Heat;Therapeutic exercise;Neuromuscular education;DME and/or AE instruction;Manual Therapy;Functional Mobility Training;Passive range of motion;Therapeutic activities;Cognitive remediation/compensation;Visual/perceptual remediation/compensation;Patient/family education;Balance training   Plan update HEP, neuro re ed to address strength with overhead reach, balance   Consulted and Agree with Plan of Care Patient   Family Member Consulted wife        Problem List Patient Active Problem List   Diagnosis Date Noted  . Hemi-neglect of left side 07/16/2014  . Spastic hemiplegia affecting nondominant side 07/10/2014  . Cognitive deficit, post-stroke 07/10/2014  . Sinus bradycardia on ECG  07/10/2014  . 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 CVA (cerebral vascular accident)   . Tobacco abuse   . HLD (hyperlipidemia)   . CVA (cerebral infarction) 07/06/2014    Quay Burow, OTR/L 09/29/2014, 12:58 PM  [facility] [address] [place], [place], [address] Phone: [phone]   Fax:  [phone]    

## 2014-09-29 NOTE — Therapy (Signed)
[facility] [address] [place], [place], [address] Phone: [phone]   Fax:  [phone]  Physical Therapy Treatment  Patient Details  Name: Samuel Shaw MRN: [id number] Date of Birth: [birth year] Referring Provider:  Charlett Blake, MD  Encounter Date: 09/29/2014      PT End of Session - 09/29/14 1208    Visit Number 12   Number of Visits 17   Date for PT Re-Evaluation 10/03/14   Authorization Type get new insurance info-   PT Start Time 1105   PT Stop Time 1145   PT Time Calculation (min) 40 min      Past Medical History  Diagnosis Date  . Stroke     No past surgical history on file.  There were no vitals filed for this visit.  Visit Diagnosis:  Lack of coordination due to stroke - Plan: PT PLAN OF CARE CERT/RE-CERT  Difficulty walking - Plan: PT PLAN OF CARE CERT/RE-CERT  Hemiplegia affecting left nondominant side - Plan: PT PLAN OF CARE CERT/RE-CERT  Impaired functional mobility and activity tolerance - Plan: PT PLAN OF CARE CERT/RE-CERT      Subjective Assessment - 09/29/14 1109    Symptoms Pt would ike to get rid of AFO if possible, and is having difficulty with stepping into bathtub as well as rising from low surfaces.   Currently in Pain? No/denies      Pt doffed AFO and put shoe back on and tied it independently for training in parallel bars without AFO. Ambulated in bars 2 laps with BUE then 2 laps with single UE--no knee hyperextension or buckling and no severe ankle instability noted, pt consistently clearing the left toe without AFO.  Trialed gait training with quad cane without AFO on indoor level surface with CGA initially, then with close supervision. Pt continues to clear the foot, and has minimal to no ankle instability. Knee has decreased extension in stance and remains in a static flexed position throughout gait pattern, but this was occurring with the AFO as well.  Completed 800' of gait training in this manner.   Practiced stepping with a mirror for visual cue for L knee.   Theract: Practiced repeated stepping over a simulated tub height obstacle initially with BUE support then with single UE support with CGA. Tactile cues and demonstration provided to increase lateral weight shift and to increase upright posture and to watch the left leg to guide is over the edge of the "tub" successfully. He will need more practice with this for increased independence with shower/tub transfers.  Sit to stands from 14" high box without UE support with good success.                              PT Short Term Goals - 09/29/14 1218    PT SHORT TERM GOAL #1   Title Demonstrate correct performance of home exercise program to address balance, gait and strength impairments. Target: 09/04/14   Status Achieved   PT SHORT TERM GOAL #2   Title Increase Berg balance test score to 40/56 for improving balance.  Target: 09/04/14   Baseline met 09/01/14- 48/56   Status Achieved   PT SHORT TERM GOAL #3   Title Decrease TUG time to 35 seconds for improving efficiency with functional mobility with LRAD.  Target: 09/04/14   Baseline met 09/01/14- 18.0 sec's   Status Achieved   PT SHORT TERM  GOAL #4   Title Begin gait training with either a single point cane or without an assistive device (with AFO) as appropriate.  Target: 09/04/14   Baseline met by 09/01/14   Status Achieved   PT SHORT TERM GOAL #5   Title Demonstrate ability to stand x5 minutes unsupported with upper extremity task for increased activity tolerance.  Target: 09/04/14   Baseline met 09/01/14   Status Achieved   Additional Short Term Goals   Additional Short Term Goals Yes   PT SHORT TERM GOAL #6   Title Pt will demonstate ability to ascend/descend stairs with bilteral hand rail and reciprocal pattern to demonstrate increaed funcitonal strength of LLE and improved efficiency of home access. Target 10/30/14    PT SHORT TERM GOAL #7   Title Pt will demonstrate ability to step "in/out" of a simulated bathtub 3/3x with UE support for progress toward independent shower/tub transfers. Target 10/30/14   PT SHORT TERM GOAL #8   Title Pt will ambulate on even indoor surfaces without AFO and without assistive device, independently. Target 10/30/14           PT Long Term Goals - 09/29/14 1222    PT LONG TERM GOAL #1   Title Verbalize understanding of signs and risk factors of CVA.  Target: 10/03/14   PT LONG TERM GOAL #2   Title Increase Berg Balance Test score to 47/56 for decreased fall risk.  Target: 10/03/14   Baseline met 09/01/14- 48/56   Status Achieved   PT LONG TERM GOAL #3   Title Ambulate 500' on indoor level surfaces without assistive device independently for increased independence with household ambulation. Target 11/27/14   Baseline Extended to end of renewal plan of care.   Status On-going   PT LONG TERM GOAL #4   Title Increase gait speed without assistive device to >1.8 ft/sec for decreased fall risk. Target: 11/27/14   Baseline extended to end of renewal plan of care   Status On-going   PT LONG TERM GOAL #5   Title Perform TUG in <30 seconds without assistive device with safe turn, independently, for increased safety with household mobility. Target: 10/03/14   Additional Long Term Goals   Additional Long Term Goals Yes   PT LONG TERM GOAL #6   Title Increase FOTO stroke impact mobility scale to 70% for decreased disability due to CVA. Target: 11/27/14    Baseline extended to end of renewal plan of care   Status On-going   PT LONG TERM GOAL #7   Title Ambulate on outdoor, uneven concrete surfaces without assistive device or AFO independently (supervision may be provided only for cognitive reasons, not physical reasons)  Target 11/27/14   PT LONG TERM GOAL #8   Title Negotiate 4 stairs with single hand rail and reciprocal pattern.  Target 11/27/14   PT LONG TERM GOAL  #9   TITLE Demonstrate  ability to step in/out of simulated tub without UE support, independently for increased independence with bathing.  Target 11/27/14               Plan - 09/29/14 1216    Clinical Impression Statement Pt demonstrated good ankle and knee stability without AFO today. Expected to continue progressing with gait and balance to eventually get rid of assistive device. Renewing x8 more weeks today To maximize functionl independence.        Problem List Patient Active Problem List   Diagnosis Date Noted  . Hemi-neglect of left side  07/16/2014  . Spastic hemiplegia affecting nondominant side 07/10/2014  . Cognitive deficit, post-stroke 07/10/2014  . Sinus bradycardia on ECG 07/10/2014  . 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 CVA (cerebral vascular accident)   . Tobacco abuse   . HLD (hyperlipidemia)   . CVA (cerebral infarction) 07/06/2014   Delrae Sawyers, PT,DPT,NCS 09/29/2014 12:59 PM Phone [phone] FAX [phone]         [facility] [address] [place], [place], [address] Phone: [phone]   Fax:  [phone]

## 2014-10-05 ENCOUNTER — Encounter: Payer: Self-pay | Admitting: Occupational Therapy

## 2014-10-05 ENCOUNTER — Encounter: Payer: BLUE CROSS/BLUE SHIELD | Attending: Physical Medicine & Rehabilitation

## 2014-10-05 ENCOUNTER — Ambulatory Visit: Payer: BLUE CROSS/BLUE SHIELD | Admitting: Occupational Therapy

## 2014-10-05 ENCOUNTER — Ambulatory Visit: Payer: BLUE CROSS/BLUE SHIELD | Attending: Physical Medicine & Rehabilitation

## 2014-10-05 ENCOUNTER — Encounter: Payer: Self-pay | Admitting: Physical Medicine & Rehabilitation

## 2014-10-05 ENCOUNTER — Ambulatory Visit (HOSPITAL_BASED_OUTPATIENT_CLINIC_OR_DEPARTMENT_OTHER): Payer: BLUE CROSS/BLUE SHIELD | Admitting: Physical Medicine & Rehabilitation

## 2014-10-05 VITALS — BP 137/99 | HR 85

## 2014-10-05 VITALS — BP 116/78 | HR 52 | Resp 14

## 2014-10-05 VITALS — BP 137/96 | HR 77

## 2014-10-05 DIAGNOSIS — F17200 Nicotine dependence, unspecified, uncomplicated: Secondary | ICD-10-CM | POA: Insufficient documentation

## 2014-10-05 DIAGNOSIS — Z7409 Other reduced mobility: Secondary | ICD-10-CM

## 2014-10-05 DIAGNOSIS — R41841 Cognitive communication deficit: Secondary | ICD-10-CM | POA: Diagnosis not present

## 2014-10-05 DIAGNOSIS — R279 Unspecified lack of coordination: Secondary | ICD-10-CM | POA: Insufficient documentation

## 2014-10-05 DIAGNOSIS — G8110 Spastic hemiplegia affecting unspecified side: Secondary | ICD-10-CM | POA: Insufficient documentation

## 2014-10-05 DIAGNOSIS — I69354 Hemiplegia and hemiparesis following cerebral infarction affecting left non-dominant side: Secondary | ICD-10-CM | POA: Insufficient documentation

## 2014-10-05 DIAGNOSIS — G8194 Hemiplegia, unspecified affecting left nondominant side: Secondary | ICD-10-CM

## 2014-10-05 DIAGNOSIS — R414 Neurologic neglect syndrome: Secondary | ICD-10-CM | POA: Insufficient documentation

## 2014-10-05 DIAGNOSIS — I69398 Other sequelae of cerebral infarction: Secondary | ICD-10-CM | POA: Diagnosis present

## 2014-10-05 DIAGNOSIS — I63511 Cerebral infarction due to unspecified occlusion or stenosis of right middle cerebral artery: Secondary | ICD-10-CM

## 2014-10-05 DIAGNOSIS — IMO0002 Reserved for concepts with insufficient information to code with codable children: Secondary | ICD-10-CM

## 2014-10-05 DIAGNOSIS — E785 Hyperlipidemia, unspecified: Secondary | ICD-10-CM | POA: Insufficient documentation

## 2014-10-05 DIAGNOSIS — I6931 Cognitive deficits following cerebral infarction: Secondary | ICD-10-CM | POA: Diagnosis not present

## 2014-10-05 DIAGNOSIS — R262 Difficulty in walking, not elsewhere classified: Secondary | ICD-10-CM

## 2014-10-05 MED ORDER — TIZANIDINE HCL 2 MG PO TABS: 2.0000 mg | ORAL_TABLET | Freq: Three times a day (TID) | ORAL | Status: DC

## 2014-10-05 NOTE — Patient Instructions (Signed)
Upgrade to home exercise program for your arm:  Do 1-2 times per day.  Monitor your blood pressure (take it before and after you do your exercises).  If the BOTTOM number is 100 or greater stop and let your therapist and Dr. Criss Rosales know immediately.   1. Sit on a firm surface. Hold a three pound weight in both of your hands, palms facing each other.  Keeping elbows straight, raise weight so that it is even with your shoulders.  Lower weight to starting position. Do 10, rest then do 10 more.  2. Lay on your back.  Hold a one pound weight in your left hand.  Keeping your elbow straight, SLOWLY raise your arm over your head as far as you can go without pain.  Return to starting position. Do 10, rest then do 10 more.  3. Lay on your back and hold a one pound weight in each hand (2 separate weights).  Hold weights at your chest, then reach for the ceiling until your elbows are straight.  HOLD FOR  A SLOW COUNT OF THREE. Return to starting position.  Do 10, rest then do 10 more.  4. Lay on your back. Hold a one pound weight in your left hand. With your elbow straight hold arm out to the side and equal to your shoulder.  Keeping elbow straight, SLOWLY raise arm up toward the ceiling and then return to starting position.  Do 10 rest, then do 10 more.      Make sure you rest between repetitions to allow your muscles to recoup and to make sure your blood pressure doesn't go too high.

## 2014-10-05 NOTE — Patient Instructions (Addendum)
I recommend no Driving  Ask Neurologist Dr Erlinda Hong about driving  I recommend no Viagra or similar meds due to recent stroke, you may ask Dr Erlinda Hong about this as well

## 2014-10-05 NOTE — Progress Notes (Signed)
Subjective:    Patient ID: Samuel Shaw, male    DOB: 1960-12-09, 54 y.o.   MRN: [id number] 53 y.o. male with history of tobacco abuse, TIA 2 years ago, CVA late December with recent discharge from hospital in [place] where he was visiting and maintained on aspirin 325 mg daily. Patient lives with his wife in [place]. Presented 07/06/2014 with progressive left-sided weakness and slurred speech since recent discharge from the hospital. CT scan reviewed from outside hospital in December showed acute right basal ganglia infarct. There was possible right ICA stenosis identified on carotid ultrasound. MRI of the brain 07/06/2014 showed acute multifocal infarcts within right middle cerebral artery territory including right basal ganglia, right anterior occipital and right middle cerebral artery territory/ watershed. 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 CTA of the neck showed right ICA occlusion 1 cm above its origin. Echocardiogram showed ejection fraction 60% no wall motion abnormalities    HPI Left groin pain, feels like spasm, zanaflex three times a day  Still needs wife to assist with LE dressing No falls but a couple "near misses" outpt PT, OT  No SLP x 1 mo Pain Inventory Average Pain 0 Pain Right Now 0 My pain is intermittent, dull and aching  In the last 24 hours, has pain interfered with the following? General activity 5 Relation with others 5 Enjoyment of life 5 What TIME of day is your pain at its worst? night Sleep (in general) Good  Pain is worse with: some activites Pain improves with: rest Relief from Meds: 0  Mobility use a cane how many minutes can you walk? 20 ability to climb steps?  yes do you drive?  no  Function disabled: date disabled .  Neuro/Psych weakness  Prior Studies Any changes since last visit?  no  Physicians involved in your care Any changes since last visit?  no   Family History  Problem Relation Age of Onset    . Diabetes Mother    History   Social History  . Marital Status: Married    Spouse Name: N/A  . Number of Children: N/A  . Years of Education: N/A   Social History Main Topics  . Smoking status: Former Smoker -- 1.00 packs/day for 15 years    Types: Cigarettes  . Smokeless tobacco: Never Used  . Alcohol Use: No  . Drug Use: No  . Sexual Activity: Not on file   Other Topics Concern  . None   Social History Narrative   History reviewed. No pertinent past surgical history. Past Medical History  Diagnosis Date  . Stroke    BP 116/78 mmHg  Pulse 52  Resp 14  SpO2 97%  Opioid Risk Score:   Fall Risk Score: Moderate Fall Risk (6-13 points)`1  Depression screen PHQ 2/9  Depression screen PHQ 2/9 10/05/2014  Decreased Interest 1  Down, Depressed, Hopeless 0  PHQ - 2 Score 1  Altered sleeping 0  Tired, decreased energy 0  Change in appetite 0  Feeling bad or failure about yourself  0  Trouble concentrating 0  Moving slowly or fidgety/restless 1  Suicidal thoughts 0  PHQ-9 Score 2    Review of Systems  Eyes: Negative.   Respiratory: Negative.   Cardiovascular: Negative.   Gastrointestinal: Negative.   Endocrine: Negative.        High blood sugar  Genitourinary: Negative.   Musculoskeletal: Negative.        Groin pain occassionally  Skin: Negative.   Allergic/Immunologic: Negative.   Neurological: Positive for weakness and headaches.  Hematological: Negative.   Psychiatric/Behavioral: The patient is nervous/anxious.        Objective:   Physical Exam  Nursing note and vitals reviewed.  JAMA R position to 70 pounds grip left, 130 pounds grip right  Motor strength left deltoid biceps triceps 4  Left hip flexor and knee extensor 4 left ankle dorsiflexor 3 minus left foot inversion and eversion 2 minus   Ambulates with a quad cane and left AFO no evidence of toe drag or knee instability.  Visual fields are intact to confrontation testing  No evidence of  neglect       Assessment & Plan:  1. 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 Still having weakness in the left upper and left lower limb.  Follow-up with PCP  See neurology in April  RTC 1 month  We'll follow up in 1 month No Driving  Do not rec use of viagra (pt is asking) until cleared by Neurology

## 2014-10-05 NOTE — Therapy (Signed)
[facility] [address] [place], [place], [address] Phone: [phone]   Fax:  [phone]  Occupational Therapy Treatment  Patient Details  Name: Samuel Shaw MRN: [id number] Date of Birth: September 28, 1960 Referring Provider:  Charlett Blake, MD  Encounter Date: 10/05/2014      OT End of Session - 10/05/14 1705    Visit Number 12   Number of Visits 20   Date for OT Re-Evaluation 10/27/14   Authorization Type pt is now on wife's insurance   OT Start Time 1615   OT Stop Time 1657   OT Time Calculation (min) 42 min      Past Medical History  Diagnosis Date  . Stroke     History reviewed. No pertinent past surgical history.  Filed Vitals:   10/05/14 1619  BP: 137/99  Pulse: 85    Visit Diagnosis:  Hemiplegia affecting left nondominant side  Impaired functional mobility and activity tolerance      Subjective Assessment - 10/05/14 1619    Subjective  I played some ball with my grandson this weekend   Pertinent History see epic snapshot, monitor BP   Currently in Pain? No/denies                    OT Treatments/Exercises (OP) - 10/05/14 0001    Exercises   Exercises Shoulder   Shoulder Exercises: Supine   Other Supine Exercises Upgraded pt's HEP for LUE to include weights. Pt able to do 10 reps x2 with three pound weight held with both hands, sitting EOM for shoulder flexion to shoulder level. Pt then able to do 10 reps x2 with one pound weight in supine of the following:  shoulder flexion to approximately 140*, chest presses and shoulder abduction with elbow straight (see pt instruction for more details).  Initially at start of session, pt's BP was 137/99 after PT session but BP dropped to 126/91 with brief rest (discussed LTG"s and plan to ugrade HEP).  After UE exercises, BP at 128/92. Program purposely kept in supine to help keep BP lower. Pt has been instructed to monitor BP while doing  these exercises and able to verbalize understanding.                OT Education - 10/05/14 1705    Education provided Yes   Education Details upgraded HEP to include weights.   Person(s) Educated Patient   Methods Explanation;Demonstration;Tactile cues;Verbal cues;Handout   Comprehension Verbalized understanding;Returned demonstration          OT Short Term Goals - 10/05/14 1708    OT SHORT TERM GOAL #1   Title Pt will be mod I with HEP - 09/01/2014   Status Achieved   OT SHORT TERM GOAL #2   Title Pt will be mod I with cutting with AE prn   Status Achieved   OT SHORT TERM GOAL #3   Title Pt will be mod I with bathing   Status Achieved   OT SHORT TERM GOAL #4   Title Pt will be mod with toilet transfers   Status Achieved   OT SHORT TERM GOAL #5   Title Pt will be mod I with shower transfers  wife does not feel comfortable with mod I transfers   Status On-going   OT SHORT TERM GOAL #6   Title Pt will be able to reach for 3 pound object on shelf with LUE at mid level   Status Achieved  OT SHORT TERM GOAL #7   Title Pt will require min a for basic organization for meal prep   Status Achieved           OT Long Term Goals - 10/05/14 1708    OT LONG TERM GOAL #1   Title Pt will be mod I with upgraded HEP - 09/29/2014   Status Achieved   OT LONG TERM GOAL #2   Title Pt will be able to reach at high reach level to obtain 3 pound object   Status Achieved   OT LONG TERM GOAL #3   Title Pt will demonstrate improved coodination as evidenced by decreasing time on 9 hole peg test by 10 seconds to assist with functional tasks (baseline= 1.00.78)   Status Achieved  42.47   OT LONG TERM GOAL #4   Title Pt will improve in functional use of LUE as evidenced by increasing box and blocks by at least 5 blocks (baseline= 33)   Status Achieved  L= 46   OT LONG TERM GOAL #5   Title Pt will be mod I for simple meal prep at ambulatory level - 10/27/2014   Status On-going   OT  LONG TERM GOAL #6   Title Pt will be mod I with upgraded HEP   Status On-going   OT LONG TERM GOAL #7   Title Pt will be able to lift 7 pound object into overhead cabinet with LUE   Status On-going   OT LONG TERM GOAL #8   Title Pt will be mod I with donning L shoe.   Status Achieved  pt no longer wearing brace   OT LONG TERM GOAL  #9   Baseline Pt will be mod I with shower transfers   Status On-going               Plan - 10/05/14 1707    Clinical Impression Statement Pt verbalized understanding of new goals and progressed to include upgraded HEP with weights.   Pt will benefit from skilled therapeutic intervention in order to improve on the following deficits (Retired) Cardiopulmonary status limiting activity;Decreased activity tolerance;Decreased balance;Impaired UE functional use;Decreased strength;Decreased mobility;Decreased coordination;Decreased cognition   Rehab Potential Good   Clinical Impairments Affecting Rehab Potential cognition, left hemiplegia   OT Frequency 2x / week   OT Duration 4 weeks   OT Treatment/Interventions Self-care/ADL training;Moist Heat;Therapeutic exercise;Neuromuscular education;DME and/or AE instruction;Manual Therapy;Functional Mobility Training;Passive range of motion;Therapeutic activities;Cognitive remediation/compensation;Visual/perceptual remediation/compensation;Patient/family education;Balance training   Plan monitor BP, check HEP.  simple hot meal prep at amb level with focus on balance, safety and use of LUE.   Consulted and Agree with Plan of Care Patient        Problem List Patient Active Problem List   Diagnosis Date Noted  . Hemi-neglect of left side 07/16/2014  . Spastic hemiplegia affecting nondominant side 07/10/2014  . Cognitive deficit, post-stroke 07/10/2014  . Sinus bradycardia on ECG 07/10/2014  . 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 CVA (cerebral vascular accident)   . Tobacco  abuse   . HLD (hyperlipidemia)   . CVA (cerebral infarction) 07/06/2014    Quay Burow, OTR/L 10/05/2014, 5:10 PM  [facility] [address] [place], [place], [address] Phone: [phone]   Fax:  [phone]

## 2014-10-05 NOTE — Therapy (Signed)
[facility] [address] [place], [place], [address] Phone: [phone]   Fax:  [phone]  Physical Therapy Treatment  Patient Details  Name: Samuel Shaw MRN: [id number] Date of Birth: 01/03/1961 Referring Provider:  Charlett Blake, MD  Encounter Date: 10/05/2014      PT End of Session - 10/05/14 2004    Visit Number 13   Number of Visits 17   Date for PT Re-Evaluation 11/27/14   Authorization Type [employer] 90 visit limit combined   PT Start Time 1537   PT Stop Time 1615   PT Time Calculation (min) 38 min      Past Medical History  Diagnosis Date  . Stroke     No past surgical history on file.  Filed Vitals:   10/05/14 1555  BP: 137/96  Pulse: 77    Visit Diagnosis:  Lack of coordination due to stroke  Difficulty walking   BP was WNL at start of session  LLE Single limb stance exercises-high knee marching with pauses, contralateral LE cross over tap, LLE step ups onto 8" stool  Braiding, retro walking, retro marching with BUE fingertip support and supervision, then without UE support with MIN A  Squat-side stepping for balance/coordination training and quadriceps strengthening with BUE fingertip support on bar.  Then LLE foot taps onto 8" stool to work on coordination with verbal cues to prevent sliding foot off of stool and to promoted increased hip/knee flexion.  Gait training x100' at end of session with MIN A to steady, without assistive device or AFO. Pt continues to demonstrate decreased LLE knee/hip flexion in swing, and excessive LLE knee flexion in stance and decreased stance time on LLE and decreased push off with RLE and LLE as well as retro lean in LLE swing phase. Therapist provided verbal and tactile cues to decrease retro lean.                           PT Short Term Goals - 09/29/14 1218    PT SHORT TERM GOAL #1   Title Demonstrate correct performance of  home exercise program to address balance, gait and strength impairments. Target: 09/04/14   Status Achieved   PT SHORT TERM GOAL #2   Title Increase Berg balance test score to 40/56 for improving balance.  Target: 09/04/14   Baseline met 09/01/14- 48/56   Status Achieved   PT SHORT TERM GOAL #3   Title Decrease TUG time to 35 seconds for improving efficiency with functional mobility with LRAD.  Target: 09/04/14   Baseline met 09/01/14- 18.0 sec's   Status Achieved   PT SHORT TERM GOAL #4   Title Begin gait training with either a single point cane or without an assistive device (with AFO) as appropriate.  Target: 09/04/14   Baseline met by 09/01/14   Status Achieved   PT SHORT TERM GOAL #5   Title Demonstrate ability to stand x5 minutes unsupported with upper extremity task for increased activity tolerance.  Target: 09/04/14   Baseline met 09/01/14   Status Achieved   Additional Short Term Goals   Additional Short Term Goals Yes   PT SHORT TERM GOAL #6   Title Pt will demonstate ability to ascend/descend stairs with bilteral hand rail and reciprocal pattern to demonstrate increaed funcitonal strength of LLE and improved efficiency of home access. Target 10/30/14   PT SHORT TERM GOAL #7   Title Pt will  demonstrate ability to step "in/out" of a simulated bathtub 3/3x with UE support for progress toward independent shower/tub transfers. Target 10/30/14   PT SHORT TERM GOAL #8   Title Pt will ambulate on even indoor surfaces without AFO and without assistive device, independently. Target 10/30/14           PT Long Term Goals - 09/29/14 1222    PT LONG TERM GOAL #1   Title Verbalize understanding of signs and risk factors of CVA.  Target: 10/03/14   PT LONG TERM GOAL #2   Title Increase Berg Balance Test score to 47/56 for decreased fall risk.  Target: 10/03/14   Baseline met 09/01/14- 48/56   Status Achieved   PT LONG TERM GOAL #3   Title Ambulate 500' on indoor level surfaces without assistive device  independently for increased independence with household ambulation. Target 11/27/14   Baseline Extended to end of renewal plan of care.   Status On-going   PT LONG TERM GOAL #4   Title Increase gait speed without assistive device to >1.8 ft/sec for decreased fall risk. Target: 11/27/14   Baseline extended to end of renewal plan of care   Status On-going   PT LONG TERM GOAL #5   Title Perform TUG in <30 seconds without assistive device with safe turn, independently, for increased safety with household mobility. Target: 10/03/14   Additional Long Term Goals   Additional Long Term Goals Yes   PT LONG TERM GOAL #6   Title Increase FOTO stroke impact mobility scale to 70% for decreased disability due to CVA. Target: 11/27/14    Baseline extended to end of renewal plan of care   Status On-going   PT LONG TERM GOAL #7   Title Ambulate on outdoor, uneven concrete surfaces without assistive device or AFO independently (supervision may be provided only for cognitive reasons, not physical reasons)  Target 11/27/14   PT LONG TERM GOAL #8   Title Negotiate 4 stairs with single hand rail and reciprocal pattern.  Target 11/27/14   PT LONG TERM GOAL  #9   TITLE Demonstrate ability to step in/out of simulated tub without UE support, independently for increased independence with bathing.  Target 11/27/14               Plan - 10/05/14 2005    Clinical Impression Statement Pt demonstrated decreased carry over of gait pattern from previous sessions, although this is likely in part due to training today without assistive device. Continue per plan of care.   PT Next Visit Plan continue gait training without assistive device        Problem List Patient Active Problem List   Diagnosis Date Noted  . Hemi-neglect of left side 07/16/2014  . Spastic hemiplegia affecting nondominant side 07/10/2014  . Cognitive deficit, post-stroke 07/10/2014  . Sinus bradycardia on ECG 07/10/2014  . Acute ischemic right  middle cerebral artery (MCA) stroke 07/08/2014  . CVA (cerebral vascular accident)   . Tobacco abuse   . HLD (hyperlipidemia)   . CVA (cerebral infarction) 07/06/2014    Harriet Masson 10/05/2014, 8:08 PM  [facility] [address] [place], [place], [address] Phone: [phone]   Fax:  [phone]

## 2014-10-08 ENCOUNTER — Telehealth: Payer: Self-pay | Admitting: *Deleted

## 2014-10-08 ENCOUNTER — Ambulatory Visit: Payer: BLUE CROSS/BLUE SHIELD | Admitting: Occupational Therapy

## 2014-10-08 ENCOUNTER — Ambulatory Visit (INDEPENDENT_AMBULATORY_CARE_PROVIDER_SITE_OTHER): Payer: BLUE CROSS/BLUE SHIELD | Admitting: Neurology

## 2014-10-08 ENCOUNTER — Encounter: Payer: Self-pay | Admitting: Neurology

## 2014-10-08 ENCOUNTER — Encounter: Payer: Self-pay | Admitting: Occupational Therapy

## 2014-10-08 VITALS — BP 118/85 | HR 60 | Resp 12 | Ht 74.0 in | Wt 170.2 lb

## 2014-10-08 VITALS — BP 128/82 | HR 60

## 2014-10-08 DIAGNOSIS — I10 Essential (primary) hypertension: Secondary | ICD-10-CM | POA: Insufficient documentation

## 2014-10-08 DIAGNOSIS — I639 Cerebral infarction, unspecified: Secondary | ICD-10-CM | POA: Diagnosis not present

## 2014-10-08 DIAGNOSIS — I63131 Cerebral infarction due to embolism of right carotid artery: Secondary | ICD-10-CM

## 2014-10-08 DIAGNOSIS — IMO0002 Reserved for concepts with insufficient information to code with codable children: Secondary | ICD-10-CM

## 2014-10-08 DIAGNOSIS — I63239 Cerebral infarction due to unspecified occlusion or stenosis of unspecified carotid arteries: Secondary | ICD-10-CM | POA: Insufficient documentation

## 2014-10-08 DIAGNOSIS — Z720 Tobacco use: Secondary | ICD-10-CM

## 2014-10-08 DIAGNOSIS — E785 Hyperlipidemia, unspecified: Secondary | ICD-10-CM | POA: Diagnosis not present

## 2014-10-08 DIAGNOSIS — G8194 Hemiplegia, unspecified affecting left nondominant side: Secondary | ICD-10-CM

## 2014-10-08 DIAGNOSIS — I69398 Other sequelae of cerebral infarction: Secondary | ICD-10-CM | POA: Diagnosis not present

## 2014-10-08 MED ORDER — NICOTINE 7 MG/24HR TD PT24: 7.0000 mg | MEDICATED_PATCH | Freq: Every day | TRANSDERMAL | Status: DC

## 2014-10-08 NOTE — Progress Notes (Signed)
STROKE NEUROLOGY FOLLOW UP NOTE  NAME: Samuel Shaw DOB: 09-20-1960  REASON FOR VISIT: stroke follow up HISTORY FROM: wife and chart  Today we had the pleasure of seeing Samuel Shaw in follow-up at our Neurology Clinic. Pt was accompanied by wife.   History Summary Samuel Shaw is an 54 y.o. male PMH of 2 TIAs 2 years ago, right carotid stenosis but no intervention, and current smoker was admitted on 07/06/14 for subacute CVA in late 06/2014 in [place] with progressive left-sided weakness since the discharge. These symptoms are similar to his stroke in December, the family just feels they are getting worse. A review of records from [place] are pertinent for a head CT showing an acute right basal ganglia infarct and he is on ASA 325mg  daily. MRI showed multifocal subacute to remote infarct at right MCA territory (no new infarct) and CTA neck showed right ICA occlusion, athero vs. Dissection. He was continued on discharged to [facility] .  Interval History During the interval time, the patient has been doing well. Discharged from [facility] on 07/29/14 and currently outpt PT/OT, 2 times per week. He made significant progress from rehab. Currently he is walking with cane and steady. His BP is 118/85 today, he is still on ASA and lipitor. He has quit smoking and on nicotine patch now.  REVIEW OF SYSTEMS: Full 14 system review of systems performed and notable only for those listed below and in HPI above, all others are negative:  Constitutional:  Activity change Cardiovascular:  Ear/Nose/Throat:   Skin:  Eyes:   Respiratory:   Gastroitestinal:   Genitourinary:  Hematology/Lymphatic:   Endocrine:  Musculoskeletal:   Allergy/Immunology:   Neurological:  HA, weakness Psychiatric:  Sleep:   The following represents the patient's updated allergies and side effects list: No Known Allergies  The neurologically relevant items on the patient's problem list were reviewed on today's  visit.  Neurologic Examination  A problem focused neurological exam (12 or more points of the single system neurologic examination, vital signs counts as 1 point, cranial nerves count for 8 points) was performed.  Blood pressure 118/85, pulse 60, resp. rate 12, height 6\' 2"  (1.88 m), weight 170 lb 3.2 oz (77.202 kg).  General - Well nourished, well developed, in no apparent distress.  Ophthalmologic - Sharp disc margins OU.  Cardiovascular - Regular rate and rhythm with no murmur.  Mental Status -  Level of arousal and orientation to time, place, and person were intact. Language including expression, naming, repetition, comprehension was assessed and found intact.  Cranial Nerves II - XII - II - Visual field intact OU. III, IV, VI - Extraocular movements intact. V - Facial sensation intact bilaterally. VII - left facial droop. VIII - Hearing & vestibular intact bilaterally. X - Palate elevates symmetrically. XI - Chin turning & shoulder shrug intact bilaterally. XII - Tongue protrusion intact.  Motor Strength - The patient's strength was LUE 5-/5 with pronator drift, LLE 4/5 proximal and 4+/5 distally.  Bulk was normal and fasciculations were absent.   Motor Tone - Muscle tone was assessed at the neck and appendages and was normal.  Reflexes - The patient's reflexes were normal in all extremities and he had no pathological reflexes.  Sensory - Light touch, temperature/pinprick were assessed and were normal.    Coordination - The patient had normal movements in the hands and feet with no ataxia or dysmetria.  Tremor was absent.  Gait and Station - walk with cane  and hemiparetic gait on the left.  Data reviewed: I personally reviewed the images and agree with the radiology interpretations.  Dg Chest 2 View 07/06/2014 CLINICAL DATA: Increasing left-sided weakness. Previous stroke. Smoker. EXAM: CHEST 2 VIEW COMPARISON: 10/31/2008. FINDINGS: Normal sized heart. Clear  lungs. Increased density overlying the posterior aspect of the lower thoracic spine. IMPRESSION: Possible mass overlying the posterior aspect of the lower thoracic spine. A chest CT with contrast is recommended. Electronically Signed By: Enrique Sack M.D.  On: 07/06/2014 15:54   Ct Head Wo Contrast 07/06/2014 No acute finding. Findings consistent with subacute to remote right MCA territory infarct. Chronic microvascular ischemic change also noted.   Mr Brain Wo Contrast 07/06/2014 Acute multifocal infarcts within RIGHT middle cerebral artery territory (including RIGHT basal ganglia). RIGHT anterior occipital likely within RIGHT middle cerebral artery territory/watershed. Limited blood sensitive sequence without lobar hematoma. Mild white matter changes, independent of acute areas of ischemia suggest chronic small vessel ischemic disease.   Mr Jodene Nam Head/brain Wo Cm 07/06/2014 RIGHT internal carotid artery occlusion. Bilateral posterior communicating arteries and suspected tiny anterior communicating artery present. Diminutive RIGHT middle cerebral artery flow related enhancement. Recommend CT angiogram of the neck for further evaluation.   CT angio neck 07/07/2014 Right internal carotid artery is occluded 1 cm above its origin. Etiology indeterminate. Focal plaque proximal left internal carotid artery without measurable/significant stenosis. Mild narrowing proximal right vertebral artery. Slightly ectatic ascending thoracic aorta (3.4 cm). Surrounding incidental findings.  2D echo - Left ventricle: The cavity size was normal. Systolic function was normal. The estimated ejection fraction was in the range of 55% to 60%. Wall motion was normal; there were no regional wall motion abnormalities.  Component     Latest Ref Rng 07/07/2014  Cholesterol     0 - 200 mg/dL 182  Triglycerides     <150 mg/dL 116  HDL     >39 mg/dL 24 (L)  Total CHOL/HDL Ratio      7.6  VLDL     0 - 40  mg/dL 23  LDL (calc)     0 - 99 mg/dL 135 (H)  Hemoglobin A1C     <5.7 % 5.6  Mean Plasma Glucose     <117 mg/dL 114  TSH     0.350 - 4.500 uIU/mL 0.473    Assessment: As you may recall, he is a 54 y.o. African American male with PMH of TIA, right carotid stenosis without intervention, smoker was admitted due to right MCA territory stroke. Found to have right ICA occlusion. He seems to have TIAs in 2010 and found to have right ICA stenosis at that time but no intervention done. His ICA occlusion likely due to athero, but dissection is also possibility. Will repeat CTA head and neck to evaluate if there is any recannulization. Also do TCD MES and VMR for risk stratification.   Plan:  - Continue ASA and lipitor for stroke prevention - continue to abstain from smoking - check BP at home and record - CT angio head and neck to evaluate any recannulization - TCD MES and VMR for stroke risk stratification. - refill nicotine patch step 3 - 7mg  patch for one month. - Follow up with your primary care physician for stroke risk factor modification. Recommend maintain blood pressure goal <130/80, diabetes with hemoglobin A1c goal below 6.5% and lipids with LDL cholesterol goal below 70 mg/dL.  - continue PT/OT - RTC in 2-3 months.   Orders Placed This Encounter  Procedures  .  CT Angio Neck W/Cm &/Or Wo/Cm    Standing Status: Future     Number of Occurrences:      Standing Expiration Date: 12/08/2015    Order Specific Question:  Reason for Exam (SYMPTOM  OR DIAGNOSIS REQUIRED)    Answer:  right ICA occlusion    Order Specific Question:  Preferred imaging location?    Answer:  Internal  . CT Angio Head W/Cm &/Or Wo Cm    Standing Status: Future     Number of Occurrences:      Standing Expiration Date: 12/08/2015    Order Specific Question:  Reason for Exam (SYMPTOM  OR DIAGNOSIS REQUIRED)    Answer:  right ICA occlusion    Order Specific Question:  Preferred imaging location?    Answer:   Internal  . [place] TCD WITHMONITORING    Standing Status: Future     Number of Occurrences:      Standing Expiration Date: 04/10/2015    Order Specific Question:  Reason for Exam (SYMPTOM  OR DIAGNOSIS REQUIRED)    Answer:  right ICA occlusion    Order Specific Question:  Preferred imaging location?    Answer:  Internal  . [place] VASOMOTOREACTIVITY    Standing Status: Future     Number of Occurrences:      Standing Expiration Date: 04/09/2015    Order Specific Question:  Reason for Exam (SYMPTOM  OR DIAGNOSIS REQUIRED)    Answer:  right ICA occlusion    Order Specific Question:  Preferred imaging location?    Answer:  Internal    Meds ordered this encounter  Medications  . nicotine (NICODERM CQ - DOSED IN MG/24 HR) 7 mg/24hr patch    Sig: Place 1 patch (7 mg total) onto the skin daily.    Dispense:  28 patch    Refill:  0    Patient Instructions  - Continue ASA and lipitor for stroke prevention - continue abstain from smoking - check BP at home - will check CT angio to evaluate blood vessels - will do head ultrasound to assess right brain reserve. - Follow up with your primary care physician for stroke risk factor modification. Recommend maintain blood pressure goal <130/80, diabetes with hemoglobin A1c goal below 6.5% and lipids with LDL cholesterol goal below 70 mg/dL.  - continue PT/OT - follow up in 2-3 months.   Rosalin Hawking, MD PhD [facility] [address], [place], [place] [address] [phone]

## 2014-10-08 NOTE — Therapy (Signed)
[facility] [address] [place], [place], [address] Phone: [phone]   Fax:  [phone]  Occupational Therapy Treatment  Patient Details  Name: Samuel Shaw MRN: [id number] Date of Birth: 1961-05-30 Referring Provider:  Charlett Blake, MD  Encounter Date: 10/08/2014      OT End of Session - 10/08/14 1700    Visit Number 13   Number of Visits 20   Date for OT Re-Evaluation 10/27/14   Authorization Type pt is now on wife's insurance   OT Start Time 8280   OT Stop Time 1700   OT Time Calculation (min) 43 min   Activity Tolerance Patient tolerated treatment well      Past Medical History  Diagnosis Date  . Stroke   . Hyperlipidemia     History reviewed. No pertinent past surgical history.  Filed Vitals:   10/08/14 1621  BP: 128/82  Pulse: 60    Visit Diagnosis:  Hemiplegia affecting left nondominant side  Lack of coordination due to stroke      Subjective Assessment - 10/08/14 1621    Subjective  I saw the dr today and he was pleased with my progress   Currently in Pain? No/denies                    OT Treatments/Exercises (OP) - 10/08/14 0001    Exercises   Exercises Hand   Hand Exercises   Theraputty Locate Pegs  blue putty with 20 pegs in peg board   Other Hand Exercises Pt with improved fine motor coordination as evidenced by decreassed time for 9 hole peg  R= 43.58  Baseline = 1.00.53.  Pt required increased time to complete pegs in blue putty.   Fine Motor Coordination   Other Fine Motor Exercises Pt able to complete key peg board with minimal difficulty.                    OT Short Term Goals - 10/08/14 1702    OT SHORT TERM GOAL #1   Title Pt will be mod I with HEP - 09/01/2014   Status Achieved   OT SHORT TERM GOAL #2   Title Pt will be mod I with cutting with AE prn   Status Achieved   OT SHORT TERM GOAL #3   Title Pt will be mod I with bathing    Status Achieved   OT SHORT TERM GOAL #4   Title Pt will be mod with toilet transfers   Status Achieved   OT SHORT TERM GOAL #5   Title Pt will be mod I with shower transfers  wife does not feel comfortable with mod I transfers   Status On-going   OT SHORT TERM GOAL #6   Title Pt will be able to reach for 3 pound object on shelf with LUE at mid level   Status Achieved   OT SHORT TERM GOAL #7   Title Pt will require min a for basic organization for meal prep   Status Achieved           OT Long Term Goals - 10/08/14 1702    OT LONG TERM GOAL #1   Title Pt will be mod I with upgraded HEP - 09/29/2014   Status Achieved   OT LONG TERM GOAL #2   Title Pt will be able to reach at high reach level to obtain 3 pound object   Status Achieved  OT LONG TERM GOAL #3   Title Pt will demonstrate improved coodination as evidenced by decreasing time on 9 hole peg test by 10 seconds to assist with functional tasks (baseline= 1.00.78)   Status Achieved  42.47   OT LONG TERM GOAL #4   Title Pt will improve in functional use of LUE as evidenced by increasing box and blocks by at least 5 blocks (baseline= 33)   Status Achieved  L= 46   OT LONG TERM GOAL #5   Title Pt will be mod I for simple meal prep at ambulatory level - 10/27/2014   Status On-going   OT LONG TERM GOAL #6   Title Pt will be mod I with upgraded HEP   Status On-going   OT LONG TERM GOAL #7   Title Pt will be able to lift 7 pound object into overhead cabinet with LUE   Status On-going   OT LONG TERM GOAL #8   Title Pt will be mod I with donning L shoe.   Status Achieved  pt no longer wearing brace   OT LONG TERM GOAL  #9   Baseline Pt will be mod I with shower transfers   Status On-going               Plan - 10/08/14 1700    Clinical Impression Statement Pt continues to make excellent progress toward LTG's. Pt's BP much more stable and pt able to tolerate more activity. Pt reports he has been doing HEP with  weights and monitoring BP at home with no issues   Pt will benefit from skilled therapeutic intervention in order to improve on the following deficits (Retired) Cardiopulmonary status limiting activity;Decreased activity tolerance;Decreased balance;Impaired UE functional use;Decreased strength;Decreased mobility;Decreased coordination;Decreased cognition   Rehab Potential Good   Clinical Impairments Affecting Rehab Potential cognition, left hemiplegia   OT Frequency 2x / week   OT Duration 4 weeks   OT Treatment/Interventions Self-care/ADL training;Moist Heat;Therapeutic exercise;Neuromuscular education;DME and/or AE instruction;Manual Therapy;Functional Mobility Training;Passive range of motion;Therapeutic activities;Cognitive remediation/compensation;Visual/perceptual remediation/compensation;Patient/family education;Balance training   Plan monitor BP, attempt to upgrade weight in HEP if pt reports BP has been stable.    Consulted and Agree with Plan of Care Patient        Problem List Patient Active Problem List   Diagnosis Date Noted  . Carotid artery occlusion with infarction 10/08/2014  . Essential hypertension 10/08/2014  . Hemi-neglect of left side 07/16/2014  . Spastic hemiplegia affecting nondominant side 07/10/2014  . Cognitive deficit, post-stroke 07/10/2014  . Sinus bradycardia on ECG 07/10/2014  . 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 CVA (cerebral vascular accident)   . Tobacco abuse   . HLD (hyperlipidemia)   . CVA (cerebral infarction) 07/06/2014    Quay Burow, OTR/L 10/08/2014, 5:03 PM  [facility] [address] [place], [place], [address] Phone: [phone]   Fax:  [phone]

## 2014-10-08 NOTE — Telephone Encounter (Signed)
Faxed per Dr. Erlinda Hong request to Dr. Iona Beard [phone], successful transmission.

## 2014-10-08 NOTE — Telephone Encounter (Signed)
-----   Message from Rosalin Hawking, MD sent at 10/08/2014 11:00 AM EDT ----- Could you please send a copy of the note to his PCP? Thanks.

## 2014-10-08 NOTE — Patient Instructions (Signed)
-   Continue ASA and lipitor for stroke prevention - continue abstain from smoking - check BP at home - will check CT angio to evaluate blood vessels - will do head ultrasound to assess right brain reserve. - Follow up with your primary care physician for stroke risk factor modification. Recommend maintain blood pressure goal <130/80, diabetes with hemoglobin A1c goal below 6.5% and lipids with LDL cholesterol goal below 70 mg/dL.  - continue PT/OT - follow up in 2-3 months.

## 2014-10-12 ENCOUNTER — Telehealth: Payer: Self-pay | Admitting: Neurology

## 2014-10-12 ENCOUNTER — Ambulatory Visit: Payer: BLUE CROSS/BLUE SHIELD

## 2014-10-12 ENCOUNTER — Ambulatory Visit: Payer: BLUE CROSS/BLUE SHIELD | Admitting: Occupational Therapy

## 2014-10-12 ENCOUNTER — Encounter: Payer: Self-pay | Admitting: Occupational Therapy

## 2014-10-12 VITALS — BP 127/96 | HR 96

## 2014-10-12 VITALS — BP 127/96 | HR 95

## 2014-10-12 DIAGNOSIS — G8194 Hemiplegia, unspecified affecting left nondominant side: Secondary | ICD-10-CM

## 2014-10-12 DIAGNOSIS — Z7409 Other reduced mobility: Secondary | ICD-10-CM

## 2014-10-12 DIAGNOSIS — R262 Difficulty in walking, not elsewhere classified: Secondary | ICD-10-CM

## 2014-10-12 DIAGNOSIS — I639 Cerebral infarction, unspecified: Secondary | ICD-10-CM

## 2014-10-12 DIAGNOSIS — I10 Essential (primary) hypertension: Secondary | ICD-10-CM

## 2014-10-12 DIAGNOSIS — I69398 Other sequelae of cerebral infarction: Secondary | ICD-10-CM | POA: Diagnosis not present

## 2014-10-12 DIAGNOSIS — IMO0002 Reserved for concepts with insufficient information to code with codable children: Secondary | ICD-10-CM

## 2014-10-12 NOTE — Therapy (Signed)
[facility] [address] [place], [place], [address] Phone: [phone]   Fax:  [phone]  Occupational Therapy Treatment  Patient Details  Name: Samuel Shaw MRN: [id number] Date of Birth: 1961/06/16 Referring Provider:  Charlett Blake, MD  Encounter Date: 10/12/2014      OT End of Session - 10/12/14 1733    Visit Number 14   Number of Visits 20   Date for OT Re-Evaluation 10/27/14   Authorization Type pt is now on wife's insurance   OT Start Time 1446   OT Stop Time 1528   OT Time Calculation (min) 42 min   Activity Tolerance Patient tolerated treatment well      Past Medical History  Diagnosis Date  . Stroke   . Hyperlipidemia     History reviewed. No pertinent past surgical history.  Filed Vitals:   10/12/14 1449  BP: 127/96  Pulse: 96    Visit Diagnosis:  Hemiplegia affecting left nondominant side  Impaired functional mobility and activity tolerance      Subjective Assessment - 10/12/14 1450    Subjective  things are going pretty good   Pertinent History see epic snapshot, monitor BP   Currently in Pain? No/denies                    OT Treatments/Exercises (OP) - 10/12/14 0001    ADLs   Functional Mobility Practiced simulated activity of stepping over edge of shower for practice at home. Pt needs min a if he steps over, Pt currently sitting on edge of tub, swinging legs over and then transferring to shower seat while wife supervises.   Home Maintenance Pt practiced mopping floor to address dynamic standing balance, increased functional use of LUE especially with task that require force. Pt able to complete safely. Also practiced carrying bag with 5 pounds of weight up and down stairs using one hand rail - pt needs occassional min a. Pt wishes to do this so he can take out garbage at home.   ADL Comments Pt's BP elevated again today during OT session, MD contacted and pt  to follow up.                  OT Short Term Goals - 10/12/14 1734    OT SHORT TERM GOAL #1   Title Pt will be mod I with HEP - 09/01/2014   Status Achieved   OT SHORT TERM GOAL #2   Title Pt will be mod I with cutting with AE prn   Status Achieved   OT SHORT TERM GOAL #3   Title Pt will be mod I with bathing   Status Achieved   OT SHORT TERM GOAL #4   Title Pt will be mod with toilet transfers   Status Achieved   OT SHORT TERM GOAL #5   Title Pt will be mod I with shower transfers  wife does not feel comfortable with mod I transfers   Status On-going   OT SHORT TERM GOAL #6   Title Pt will be able to reach for 3 pound object on shelf with LUE at mid level   Status Achieved   OT SHORT TERM GOAL #7   Title Pt will require min a for basic organization for meal prep   Status Achieved           OT Long Term Goals - 10/12/14 1735    OT LONG TERM GOAL #1  Title Pt will be mod I with upgraded HEP - 09/29/2014   Status Achieved   OT LONG TERM GOAL #2   Title Pt will be able to reach at high reach level to obtain 3 pound object   Status Achieved   OT LONG TERM GOAL #3   Title Pt will demonstrate improved coodination as evidenced by decreasing time on 9 hole peg test by 10 seconds to assist with functional tasks (baseline= 1.00.78)   Status Achieved  42.47   OT LONG TERM GOAL #4   Title Pt will improve in functional use of LUE as evidenced by increasing box and blocks by at least 5 blocks (baseline= 33)   Status Achieved  L= 46   OT LONG TERM GOAL #5   Title Pt will be mod I for simple meal prep at ambulatory level - 10/27/2014   Status On-going   OT LONG TERM GOAL #6   Title Pt will be mod I with upgraded HEP   Status On-going   OT LONG TERM GOAL #7   Title Pt will be able to lift 7 pound object into overhead cabinet with LUE   Status Achieved   OT LONG TERM GOAL #8   Title Pt will be mod I with donning L shoe.   Status Achieved  pt no longer wearing brace    OT LONG TERM GOAL  #9   Baseline Pt will be mod I with shower transfers   Status On-going               Plan - 10/12/14 1733    Clinical Impression Statement Pt continues to make excellent progress toward goals. BP again an issue today and MD was contacted. Pt to follow up with MD.   Pt will benefit from skilled therapeutic intervention in order to improve on the following deficits (Retired) Cardiopulmonary status limiting activity;Decreased activity tolerance;Decreased balance;Impaired UE functional use;Decreased strength;Decreased mobility;Decreased coordination;Decreased cognition   Rehab Potential Good   Clinical Impairments Affecting Rehab Potential cognition, left hemiplegia   OT Frequency 2x / week   OT Duration 4 weeks   OT Treatment/Interventions Self-care/ADL training;Moist Heat;Therapeutic exercise;Neuromuscular education;DME and/or AE instruction;Manual Therapy;Functional Mobility Training;Passive range of motion;Therapeutic activities;Cognitive remediation/compensation;Visual/perceptual remediation/compensation;Patient/family education;Balance training   Plan monitor BP, dynamic standing balance incorporating use of LUE   Consulted and Agree with Plan of Care Patient        Problem List Patient Active Problem List   Diagnosis Date Noted  . Carotid artery occlusion with infarction 10/08/2014  . Essential hypertension 10/08/2014  . Hemi-neglect of left side 07/16/2014  . Spastic hemiplegia affecting nondominant side 07/10/2014  . Cognitive deficit, post-stroke 07/10/2014  . Sinus bradycardia on ECG 07/10/2014  . 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 CVA (cerebral vascular accident)   . Tobacco abuse   . HLD (hyperlipidemia)   . CVA (cerebral infarction) 07/06/2014    Quay Burow, OTR/L 10/12/2014, 5:36 PM  [facility] [address] [place], [place],  [address] Phone: [phone]   Fax:  [phone]

## 2014-10-12 NOTE — Therapy (Signed)
[facility] [address] [place], [place], [address] Phone: [phone]   Fax:  [phone]  Physical Therapy Treatment  Patient Details  Name: Samuel Shaw MRN: [id number] Date of Birth: [birth year] Referring Provider:  Charlett Blake, MD  Encounter Date: 10/12/2014      PT End of Session - 10/12/14 1504    Visit Number 14   Number of Visits 17   Date for PT Re-Evaluation 11/27/14   Authorization Type [employer] 90 visit limit combined   PT Start Time 5462   PT Stop Time 1445   PT Time Calculation (min) 40 min      Past Medical History  Diagnosis Date  . Stroke   . Hyperlipidemia     History reviewed. No pertinent past surgical history.  Filed Vitals:   10/12/14 1409 10/12/14 1436 10/12/14 1447 10/12/14 1448  BP: 125/88 131/111 121/94 127/96  Pulse: 77 128 98 95    Visit Diagnosis:  Hemiplegia affecting left nondominant side  Lack of coordination due to stroke  Difficulty walking      Subjective Assessment - 10/12/14 1409    Subjective Pt reports his physician has not changed the BP dose, continuing to wait for it to take full effect   Currently in Pain? No/denies     Checked baseline BP  Gait training- Indoor, level surfaces without assistive device [phone]' with CGA pt noted to have decreased knee flexion in preswing. Moved into the parallel bars w/ mirror for visual cue. Constant verbal cues and therapist mirror demo for pt to perform forward weight shift onto the right leg, drop/flex the left knee, and swing leg into knee extension then step back and repeat. He performed this for multiple trials with BUE then single UE support. Progressed to add a heel strike at the end and performed with BUE then single UE support. Progressed further to add a subsequent right foot step. Progressed again to ambulate in bars slowly with constant verbal cues for this form, initially with BUE support then single UE  support.    Rechecked BP too high, rest provided x5 minutes  Rechecked BP: 121/94  Resumed gait training: x185' on level surface without assistived device with emphasis on continuing pattern like it was in the bars. Pt demonstrated great improvement in form for first 200' but as he fatigued, began to maintain a stiff LLE again.   Rechecked BP at end of session before OT  *an EPIC inbasket message has been sent to pt's physician regarding his hypertension                          PT Short Term Goals - 09/29/14 1218    PT SHORT TERM GOAL #1   Title Demonstrate correct performance of home exercise program to address balance, gait and strength impairments. Target: 09/04/14   Status Achieved   PT SHORT TERM GOAL #2   Title Increase Berg balance test score to 40/56 for improving balance.  Target: 09/04/14   Baseline met 09/01/14- 48/56   Status Achieved   PT SHORT TERM GOAL #3   Title Decrease TUG time to 35 seconds for improving efficiency with functional mobility with LRAD.  Target: 09/04/14   Baseline met 09/01/14- 18.0 sec's   Status Achieved   PT SHORT TERM GOAL #4   Title Begin gait training with either a single point cane or without an assistive device (with AFO) as appropriate.  Target: 09/04/14   Baseline met by 09/01/14   Status Achieved   PT SHORT TERM GOAL #5   Title Demonstrate ability to stand x5 minutes unsupported with upper extremity task for increased activity tolerance.  Target: 09/04/14   Baseline met 09/01/14   Status Achieved   Additional Short Term Goals   Additional Short Term Goals Yes   PT SHORT TERM GOAL #6   Title Pt will demonstate ability to ascend/descend stairs with bilteral hand rail and reciprocal pattern to demonstrate increaed funcitonal strength of LLE and improved efficiency of home access. Target 10/30/14   PT SHORT TERM GOAL #7   Title Pt will demonstrate ability to step "in/out" of a simulated bathtub 3/3x with UE support for progress toward  independent shower/tub transfers. Target 10/30/14   PT SHORT TERM GOAL #8   Title Pt will ambulate on even indoor surfaces without AFO and without assistive device, independently. Target 10/30/14           PT Long Term Goals - 09/29/14 1222    PT LONG TERM GOAL #1   Title Verbalize understanding of signs and risk factors of CVA.  Target: 10/03/14   PT LONG TERM GOAL #2   Title Increase Berg Balance Test score to 47/56 for decreased fall risk.  Target: 10/03/14   Baseline met 09/01/14- 48/56   Status Achieved   PT LONG TERM GOAL #3   Title Ambulate 500' on indoor level surfaces without assistive device independently for increased independence with household ambulation. Target 11/27/14   Baseline Extended to end of renewal plan of care.   Status On-going   PT LONG TERM GOAL #4   Title Increase gait speed without assistive device to >1.8 ft/sec for decreased fall risk. Target: 11/27/14   Baseline extended to end of renewal plan of care   Status On-going   PT LONG TERM GOAL #5   Title Perform TUG in <30 seconds without assistive device with safe turn, independently, for increased safety with household mobility. Target: 10/03/14   Additional Long Term Goals   Additional Long Term Goals Yes   PT LONG TERM GOAL #6   Title Increase FOTO stroke impact mobility scale to 70% for decreased disability due to CVA. Target: 11/27/14    Baseline extended to end of renewal plan of care   Status On-going   PT LONG TERM GOAL #7   Title Ambulate on outdoor, uneven concrete surfaces without assistive device or AFO independently (supervision may be provided only for cognitive reasons, not physical reasons)  Target 11/27/14   PT LONG TERM GOAL #8   Title Negotiate 4 stairs with single hand rail and reciprocal pattern.  Target 11/27/14   PT LONG TERM GOAL  #9   TITLE Demonstrate ability to step in/out of simulated tub without UE support, independently for increased independence with bathing.  Target 11/27/14                Plan - 10/12/14 1505    Clinical Impression Statement Pt demonstrated significant improvement in gait pattern with training today; blood pressure continues to be a limiting factor. Continue per plan of care according to blood pressure tolerance   PT Next Visit Plan continue gait training without assistive device, and balance training        Problem List Patient Active Problem List   Diagnosis Date Noted  . Carotid artery occlusion with infarction 10/08/2014  . Essential hypertension 10/08/2014  . Hemi-neglect of left side 07/16/2014  .  Spastic hemiplegia affecting nondominant side 07/10/2014  . Cognitive deficit, post-stroke 07/10/2014  . Sinus bradycardia on ECG 07/10/2014  . 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 CVA (cerebral vascular accident)   . Tobacco abuse   . HLD (hyperlipidemia)   . CVA (cerebral infarction) 07/06/2014   Delrae Sawyers, PT,DPT,NCS 10/12/2014 5:09 PM Phone [phone] FAX [phone]         [facility] [address] [place], [place], [address] Phone: [phone]   Fax:  [phone]

## 2014-10-12 NOTE — Telephone Encounter (Signed)
Samuel Shaw with [facility] @[phone] is calling for an order for him to get blood work BUN and Creatine before 4/14 appointment @10 :00.  Please call.

## 2014-10-13 NOTE — Telephone Encounter (Signed)
I wasn't sure if this was resent to responsible person.  Thank you, Andria Rhein

## 2014-10-14 ENCOUNTER — Telehealth: Payer: Self-pay | Admitting: Neurology

## 2014-10-14 LAB — BUN: BUN: 11 mg/dL (ref 6–23)

## 2014-10-14 LAB — CREATININE, SERUM: Creat: 1.05 mg/dL (ref 0.50–1.35)

## 2014-10-14 NOTE — Telephone Encounter (Signed)
Thank you.  Rosalin Hawking, MD PhD Stroke Neurology 10/14/2014 9:50 PM

## 2014-10-14 NOTE — Telephone Encounter (Signed)
Orders placed, pls print and fax.

## 2014-10-14 NOTE — Telephone Encounter (Signed)
[name] at [facility] lab @ [phone] requesting BUN and Creatinine blood work order for patient.  Patient is there now.  Please call and advise.

## 2014-10-14 NOTE — Telephone Encounter (Signed)
I was paged this evening for stat lab results.  Stat BUN and creatine:  BUN:11 Creatinine: 1.05  Thank you

## 2014-10-15 ENCOUNTER — Ambulatory Visit: Payer: BLUE CROSS/BLUE SHIELD | Admitting: Occupational Therapy

## 2014-10-15 ENCOUNTER — Telehealth: Payer: Self-pay | Admitting: Neurology

## 2014-10-15 ENCOUNTER — Inpatient Hospital Stay: Admission: RE | Admit: 2014-10-15 | Payer: BLUE CROSS/BLUE SHIELD | Source: Ambulatory Visit

## 2014-10-15 ENCOUNTER — Other Ambulatory Visit: Payer: BLUE CROSS/BLUE SHIELD

## 2014-10-15 ENCOUNTER — Encounter: Payer: Self-pay | Admitting: Occupational Therapy

## 2014-10-15 VITALS — BP 125/85 | HR 79

## 2014-10-15 DIAGNOSIS — G8194 Hemiplegia, unspecified affecting left nondominant side: Secondary | ICD-10-CM

## 2014-10-15 DIAGNOSIS — Z7409 Other reduced mobility: Secondary | ICD-10-CM

## 2014-10-15 DIAGNOSIS — I69398 Other sequelae of cerebral infarction: Secondary | ICD-10-CM | POA: Diagnosis not present

## 2014-10-15 NOTE — Patient Instructions (Signed)
Upgrade to home exercise program for your arm: Do 1-2 times per day. Monitor your blood pressure (take it before and after you do your exercises). If the BOTTOM number is 100 or greater stop and let your therapist and Dr. Criss Rosales know immediately.   1. Sit on a firm surface. Hold a five pound weight in both of your hands, palms facing each other. Keeping elbows straight, raise weight so that it is even with your shoulders. Lower weight to starting position. Do 12, rest then do 12 more.  2. Lay on your back. Hold a five pound weight in both of your hands, palms facing each other. With hands at chest level, slowly raise the weight up to toward the ceiling then lower to chest again. Do 12, rest then do 12 more.   3. Lay on your back. Hold a two pound weight in your left hand, palm facing in. Keeping your elbow straight, SLOWLY raise your arm over your head as far as you can go without pain. Return to starting position. Do 12, rest then do 12 more.   4. Lay on your back. Hold a one pound weight in your left hand. With your elbow straight hold arm out to the side and equal to your shoulder. Keeping elbow straight, SLOWLY raise arm up toward the ceiling and then return to starting position. Do 12 rest, then do 12 more.   5. Stand in front of a cabinet. Using your left hand, raise 5 pound weigh up to mid cabinet level then lower.  Do 10 only - do not repeat.   Make sure you rest between repetitions to allow your muscles to recoup and to make sure your blood pressure doesn't go too high

## 2014-10-15 NOTE — Therapy (Signed)
[facility] [address] [place], [place], [address] Phone: [phone]   Fax:  [phone]  Occupational Therapy Treatment  Patient Details  Name: Samuel Shaw MRN: [id number] Date of Birth: Jun 13, 1961 Referring Provider:  Charlett Blake, MD  Encounter Date: 10/15/2014      OT End of Session - 10/15/14 1659    Visit Number 15   Number of Visits 20   Date for OT Re-Evaluation 10/27/14   Authorization Type pt is now on wife's insurance   OT Start Time 1608   OT Stop Time 1650   OT Time Calculation (min) 42 min   Activity Tolerance Patient tolerated treatment well      Past Medical History  Diagnosis Date  . Stroke   . Hyperlipidemia     History reviewed. No pertinent past surgical history.  Filed Vitals:   10/15/14 1618  BP: 125/85  Pulse: 79    Visit Diagnosis:  Hemiplegia affecting left nondominant side  Impaired functional mobility and activity tolerance      Subjective Assessment - 10/15/14 1609    Subjective  I think I am doing pretty good at home   Pertinent History see epic snapshot, monitor BP   Currently in Pain? No/denies                    OT Treatments/Exercises (OP) - 10/15/14 0001    Exercises   Exercises Shoulder   Shoulder Exercises: Supine   Other Supine Exercises Upgraded pt's HEP to include increase weight and increased reps. Pt is doing several exercises in supine in order to keep his BP down. Pt is doing one exercise seated and one in standing. BP rose only to 139/88 with HR of 81 today after UE execise. See pt instuction for details of reps and weights for each exercise. Pt will complete exercise program at home this weekend with pt and wife to monitor BP before and after exercises. If BP stays stable will increase reps only next visit.                 OT Education - 10/15/14 1658    Education provided Yes   Education Details upgraded UE  exercises by increasing weights and reps   Person(s) Educated Patient;Spouse   Methods Explanation;Demonstration;Verbal cues;Handout   Comprehension Verbalized understanding;Returned demonstration          OT Short Term Goals - 10/15/14 1701    OT SHORT TERM GOAL #1   Title Pt will be mod I with HEP - 09/01/2014   Status Achieved   OT SHORT TERM GOAL #2   Title Pt will be mod I with cutting with AE prn   Status Achieved   OT SHORT TERM GOAL #3   Title Pt will be mod I with bathing   Status Achieved   OT SHORT TERM GOAL #4   Title Pt will be mod with toilet transfers   Status Achieved   OT SHORT TERM GOAL #5   Title Pt will be mod I with shower transfers  wife does not feel comfortable with mod I transfers   Status On-going   OT SHORT TERM GOAL #6   Title Pt will be able to reach for 3 pound object on shelf with LUE at mid level   Status Achieved   OT SHORT TERM GOAL #7   Title Pt will require min a for basic organization for meal prep   Status Achieved  OT Long Term Goals - 10/15/14 1701    OT LONG TERM GOAL #1   Title Pt will be mod I with upgraded HEP - 09/29/2014   Status Achieved   OT LONG TERM GOAL #2   Title Pt will be able to reach at high reach level to obtain 3 pound object   Status Achieved   OT LONG TERM GOAL #3   Title Pt will demonstrate improved coodination as evidenced by decreasing time on 9 hole peg test by 10 seconds to assist with functional tasks (baseline= 1.00.78)   Status Achieved  42.47   OT LONG TERM GOAL #4   Title Pt will improve in functional use of LUE as evidenced by increasing box and blocks by at least 5 blocks (baseline= 33)   Status Achieved  L= 46   OT LONG TERM GOAL #5   Title Pt will be mod I for simple meal prep at ambulatory level - 10/27/2014   Status On-going   OT LONG TERM GOAL #6   Title Pt will be mod I with upgraded HEP   Status On-going   OT LONG TERM GOAL #7   Title Pt will be able to lift 7 pound object  into overhead cabinet with LUE   Status Achieved   OT LONG TERM GOAL #8   Title Pt will be mod I with donning L shoe.   Status Achieved  pt no longer wearing brace   OT LONG TERM GOAL  #9   Baseline Pt will be mod I with shower transfers   Status On-going               Plan - 10/15/14 1659    Clinical Impression Statement Pt progressing toward goals. Pt's BP remained stable today. Wife attended and will ensure that pt will monitor BP before and after HEP.   Pt will benefit from skilled therapeutic intervention in order to improve on the following deficits (Retired) Cardiopulmonary status limiting activity;Decreased activity tolerance;Decreased balance;Impaired UE functional use;Decreased strength;Decreased mobility;Decreased coordination;Decreased cognition   Rehab Potential Good   Clinical Impairments Affecting Rehab Potential cognition, left hemiplegia   OT Frequency 2x / week   OT Duration 4 weeks   OT Treatment/Interventions Self-care/ADL training;Moist Heat;Therapeutic exercise;Neuromuscular education;DME and/or AE instruction;Manual Therapy;Functional Mobility Training;Passive range of motion;Therapeutic activities;Cognitive remediation/compensation;Visual/perceptual remediation/compensation;Patient/family education;Balance training   Plan monitor BP, check how pt did with HEP and BP issues - if stable increase reps to 15.  dynamic standing balance, use of LUE   Consulted and Agree with Plan of Care Patient   Family Member Consulted wife        Problem List Patient Active Problem List   Diagnosis Date Noted  . Carotid artery occlusion with infarction 10/08/2014  . Essential hypertension 10/08/2014  . Hemi-neglect of left side 07/16/2014  . Spastic hemiplegia affecting nondominant side 07/10/2014  . Cognitive deficit, post-stroke 07/10/2014  . Sinus bradycardia on ECG 07/10/2014  . 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 CVA (cerebral  vascular accident)   . Tobacco abuse   . HLD (hyperlipidemia)   . CVA (cerebral infarction) 07/06/2014    Quay Burow, OTR/L 10/15/2014, 5:03 PM  [facility] [address] [place], [place], [address] Phone: [phone]   Fax:  [phone]

## 2014-10-15 NOTE — Telephone Encounter (Signed)
I called pts wife.  He was at therapy.  Rescheduled 10-20-14 at 1400.  Lab work should still be good.

## 2014-10-15 NOTE — Telephone Encounter (Signed)
FYI--Alicia with [facility] called to advise that the patient no showed for his CT Angio, head and neck this morning.

## 2014-10-19 ENCOUNTER — Encounter: Payer: Self-pay | Admitting: Occupational Therapy

## 2014-10-19 ENCOUNTER — Ambulatory Visit: Payer: BLUE CROSS/BLUE SHIELD | Admitting: Occupational Therapy

## 2014-10-19 ENCOUNTER — Encounter: Payer: Self-pay | Admitting: Physical Therapy

## 2014-10-19 ENCOUNTER — Ambulatory Visit: Payer: BLUE CROSS/BLUE SHIELD | Admitting: Physical Therapy

## 2014-10-19 VITALS — BP 120/83 | HR 64

## 2014-10-19 VITALS — BP 135/88 | HR 65

## 2014-10-19 DIAGNOSIS — I69398 Other sequelae of cerebral infarction: Secondary | ICD-10-CM | POA: Diagnosis not present

## 2014-10-19 DIAGNOSIS — G8194 Hemiplegia, unspecified affecting left nondominant side: Secondary | ICD-10-CM

## 2014-10-19 DIAGNOSIS — IMO0002 Reserved for concepts with insufficient information to code with codable children: Secondary | ICD-10-CM

## 2014-10-19 DIAGNOSIS — R262 Difficulty in walking, not elsewhere classified: Secondary | ICD-10-CM

## 2014-10-19 DIAGNOSIS — Z7409 Other reduced mobility: Secondary | ICD-10-CM

## 2014-10-19 NOTE — Patient Instructions (Signed)
Upgrade to home exercise program for your arm: Do 1-2 times per day. Monitor your blood pressure (take it before and after you do your exercises). If the BOTTOM number is 100 or greater stop and let your therapist and Dr. Criss Shaw know immediately.   1. Sit on a firm surface. Hold a five pound weight in both of your hands, palms facing each other. Keeping elbows straight, raise weight so that it is even with your shoulders. Lower weight to starting position. Do 15, rest then do 15 more.  2. Lay on your back. Hold a five pound weight in both of your hands, palms facing each other. With hands at chest level, slowly raise the weight up to toward the ceiling then lower to chest again. Do 15, rest then do 15 more.   3. Lay on your back. Hold a two pound weight in your left hand, palm facing in. Keeping your elbow straight, SLOWLY raise your arm over your head as far as you can go without pain. Return to starting position. Do 15, rest then do 15 more.   4. Lay on your back. Hold a two pound weight in your left hand. With your elbow straight hold arm out to the side and equal to your shoulder. Keeping elbow straight, SLOWLY raise arm up toward the ceiling and then return to starting position. Do 15 rest, then do 15 more.   5. Stand in front of a cabinet. Using your left hand, raise 5 pound weigh up to mid cabinet level then lower. Do 10 only - do not repeat.   Make sure you rest between repetitions to allow your muscles to recoup and to make sure your blood pressure doesn't go too high

## 2014-10-19 NOTE — Therapy (Signed)
[facility] [address] [place], [place], [address] Phone: [phone]   Fax:  [phone]  Occupational Therapy Treatment  Patient Details  Name: Samuel Shaw MRN: [id number] Date of Birth: 11-04-1960 Referring Provider:  Charlett Blake, MD  Encounter Date: 10/19/2014      OT End of Session - 10/19/14 1653    Visit Number 16   Number of Visits 20   Date for OT Re-Evaluation 10/27/14   Authorization Type pt is now on wife's insurance   OT Start Time 1533   OT Stop Time 1614   OT Time Calculation (min) 41 min      Past Medical History  Diagnosis Date  . Stroke   . Hyperlipidemia     History reviewed. No pertinent past surgical history.  Filed Vitals:   10/19/14 1541  BP: 120/83  Pulse: 64    Visit Diagnosis:  Hemiplegia affecting left nondominant side      Subjective Assessment - 10/19/14 1537    Subjective  I saw my doctor today (Dr. Berdine Addison).     Pertinent History see epic snapshot, monitor BP   Currently in Pain? No/denies                      OT Treatments/Exercises (OP) - 10/19/14 0001    Exercises   Exercises Shoulder   Shoulder Exercises: Supine   Other Supine Exercises Pt reports he monitored BP before and after completing his HEP with increased weight and had no issues with BP.  Upgraded HEP again to increase reps today and pt again had no difficulties with BP.  See pt instruction to see upgraded HEP.  Pt tolerating well but clearly encouraged pt to continue to monitor BP as this can be inconsistent. Pt able to verbalize and this was also put into writing and reinforced last session with his wife.                OT Education - 10/19/14 1653    Education provided Yes   Education Details upgraded HEP by increasing reps   Person(s) Educated Patient   Methods Explanation;Demonstration;Verbal cues;Handout   Comprehension Verbalized understanding;Returned  demonstration          OT Short Term Goals - 10/19/14 1656    OT SHORT TERM GOAL #1   Title Pt will be mod I with HEP - 09/01/2014   Status Achieved   OT SHORT TERM GOAL #2   Title Pt will be mod I with cutting with AE prn   Status Achieved   OT SHORT TERM GOAL #3   Title Pt will be mod I with bathing   Status Achieved   OT SHORT TERM GOAL #4   Title Pt will be mod with toilet transfers   Status Achieved   OT SHORT TERM GOAL #5   Title Pt will be mod I with shower transfers  wife does not feel comfortable with mod I transfers   Status On-going   OT SHORT TERM GOAL #6   Title Pt will be able to reach for 3 pound object on shelf with LUE at mid level   Status Achieved   OT SHORT TERM GOAL #7   Title Pt will require min a for basic organization for meal prep   Status Achieved           OT Long Term Goals - 10/19/14 1656    OT LONG TERM GOAL #1  Title Pt will be mod I with upgraded HEP - 09/29/2014   Status Achieved   OT LONG TERM GOAL #2   Title Pt will be able to reach at high reach level to obtain 3 pound object   Status Achieved   OT LONG TERM GOAL #3   Title Pt will demonstrate improved coodination as evidenced by decreasing time on 9 hole peg test by 10 seconds to assist with functional tasks (baseline= 1.00.78)   Status Achieved  42.47   OT LONG TERM GOAL #4   Title Pt will improve in functional use of LUE as evidenced by increasing box and blocks by at least 5 blocks (baseline= 33)   Status Achieved  L= 46   OT LONG TERM GOAL #5   Title Pt will be mod I for simple meal prep at ambulatory level - 10/27/2014   Status Achieved   OT LONG TERM GOAL #6   Title Pt will be mod I with upgraded HEP   Status On-going   OT LONG TERM GOAL #7   Title Pt will be able to lift 7 pound object into overhead cabinet with LUE   Status Achieved   OT LONG TERM GOAL #8   Title Pt will be mod I with donning L shoe.   Status Achieved  pt no longer wearing brace   OT LONG TERM  GOAL  #9   Baseline Pt will be mod I with shower transfers   Status On-going               Plan - 10/19/14 1654    Clinical Impression Statement Pt progressing toward LTG's and BP appears to be stabilizing even with activity. Will continue to monitor and pt knows he needs to monitor this at home as well.   Pt will benefit from skilled therapeutic intervention in order to improve on the following deficits (Retired) Cardiopulmonary status limiting activity;Decreased activity tolerance;Decreased balance;Impaired UE functional use;Decreased strength;Decreased mobility;Decreased coordination;Decreased cognition   Rehab Potential Good   OT Frequency 2x / week   OT Duration 4 weeks   OT Treatment/Interventions Self-care/ADL training;Moist Heat;Therapeutic exercise;Neuromuscular education;DME and/or AE instruction;Manual Therapy;Functional Mobility Training;Passive range of motion;Therapeutic activities;Cognitive remediation/compensation;Visual/perceptual remediation/compensation;Patient/family education;Balance training   Plan LUE strength with overhead reach, functional use of LUE   Consulted and Agree with Plan of Care Patient        Problem List Patient Active Problem List   Diagnosis Date Noted  . Carotid artery occlusion with infarction 10/08/2014  . Essential hypertension 10/08/2014  . Hemi-neglect of left side 07/16/2014  . Spastic hemiplegia affecting nondominant side 07/10/2014  . Cognitive deficit, post-stroke 07/10/2014  . Sinus bradycardia on ECG 07/10/2014  . 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 CVA (cerebral vascular accident)   . Tobacco abuse   . HLD (hyperlipidemia)   . CVA (cerebral infarction) 07/06/2014    Quay Burow , OTR/L  10/19/2014, 4:57 PM  [facility] [address] [place], [place], [address] Phone: [phone]   Fax:  [phone]

## 2014-10-19 NOTE — Therapy (Signed)
[facility] [address] [place], [place], [address] Phone: [phone]   Fax:  [phone]  Physical Therapy Treatment  Patient Details  Name: Samuel Shaw MRN: [id number] Date of Birth: 04-Jan-1961 Referring Provider:  Iona Beard, MD  Encounter Date: 10/19/2014      PT End of Session - 10/19/14 1235    Visit Number 15   Number of Visits 17   Date for PT Re-Evaluation 11/27/14   Authorization Type BC[employer] 90 visit limit combined   PT Start Time 1233   PT Stop Time 1313   PT Time Calculation (min) 40 min      Past Medical History  Diagnosis Date  . Stroke   . Hyperlipidemia     History reviewed. No pertinent past surgical history.  Filed Vitals:   10/19/14 1234 10/19/14 1245 10/19/14 1257 10/19/14 1311  BP: 114/82 110/91 143/87 135/88  Pulse: 56 57 65     Visit Diagnosis:  Difficulty walking  Lack of coordination due to stroke  Impaired functional mobility and activity tolerance      Subjective Assessment - 10/19/14 1234    Subjective No new complaints. No falls or pain to report   Currently in Pain? No/denies   Pain Score 0-No pain          [facility] Adult PT Treatment/Exercise - 10/19/14 0001    Ambulation/Gait   Ambulation/Gait Yes   Ambulation/Gait Assistance 4: Min guard;5: Supervision   Ambulation/Gait Assistance Details cues for increased left hip/knee flexion with swing phase and for increased knee extension with stance phase of gait. cues for UE relaxation and arm swing.                     Ambulation Distance (Feet) 240 Feet   Assistive device None   Gait Pattern Step-through pattern;Decreased stance time - left;Decreased step length - right;Decreased hip/knee flexion - left   Ambulation Surface Level;Indoor     Treatment continued: At counter: no to light UE support - alternating high knee marching in place with holds at top x 3 sec's x 10 each side - forward/backward high knee  marching x 3 laps each way - side stepping with squats x 2 laps each way  At stairs - right stance with left foot taps to first 3 steps up and then down, emphasis on lifting the left foot, not sliding to to advance it. 10 reps of the up/down sequence  In parallel bars - with 8 inch box: alternating step ups with contralateral knee high march x 10 each forward; x 10 each lateral. Light UE/fingertip support needed with these exercises.         PT Short Term Goals - 09/29/14 1218    PT SHORT TERM GOAL #1   Title Demonstrate correct performance of home exercise program to address balance, gait and strength impairments. Target: 09/04/14   Status Achieved   PT SHORT TERM GOAL #2   Title Increase Berg balance test score to 40/56 for improving balance.  Target: 09/04/14   Baseline met 09/01/14- 48/56   Status Achieved   PT SHORT TERM GOAL #3   Title Decrease TUG time to 35 seconds for improving efficiency with functional mobility with LRAD.  Target: 09/04/14   Baseline met 09/01/14- 18.0 sec's   Status Achieved   PT SHORT TERM GOAL #4   Title Begin gait training with either a single point cane or without an assistive device (with AFO) as  appropriate.  Target: 09/04/14   Baseline met by 09/01/14   Status Achieved   PT SHORT TERM GOAL #5   Title Demonstrate ability to stand x5 minutes unsupported with upper extremity task for increased activity tolerance.  Target: 09/04/14   Baseline met 09/01/14   Status Achieved   Additional Short Term Goals   Additional Short Term Goals Yes   PT SHORT TERM GOAL #6   Title Pt will demonstate ability to ascend/descend stairs with bilteral hand rail and reciprocal pattern to demonstrate increaed funcitonal strength of LLE and improved efficiency of home access. Target 10/30/14   PT SHORT TERM GOAL #7   Title Pt will demonstrate ability to step "in/out" of a simulated bathtub 3/3x with UE support for progress toward independent shower/tub transfers. Target 10/30/14   PT  SHORT TERM GOAL #8   Title Pt will ambulate on even indoor surfaces without AFO and without assistive device, independently. Target 10/30/14           PT Long Term Goals - 09/29/14 1222    PT LONG TERM GOAL #1   Title Verbalize understanding of signs and risk factors of CVA.  Target: 10/03/14   PT LONG TERM GOAL #2   Title Increase Berg Balance Test score to 47/56 for decreased fall risk.  Target: 10/03/14   Baseline met 09/01/14- 48/56   Status Achieved   PT LONG TERM GOAL #3   Title Ambulate 500' on indoor level surfaces without assistive device independently for increased independence with household ambulation. Target 11/27/14   Baseline Extended to end of renewal plan of care.   Status On-going   PT LONG TERM GOAL #4   Title Increase gait speed without assistive device to >1.8 ft/sec for decreased fall risk. Target: 11/27/14   Baseline extended to end of renewal plan of care   Status On-going   PT LONG TERM GOAL #5   Title Perform TUG in <30 seconds without assistive device with safe turn, independently, for increased safety with household mobility. Target: 10/03/14   Additional Long Term Goals   Additional Long Term Goals Yes   PT LONG TERM GOAL #6   Title Increase FOTO stroke impact mobility scale to 70% for decreased disability due to CVA. Target: 11/27/14    Baseline extended to end of renewal plan of care   Status On-going   PT LONG TERM GOAL #7   Title Ambulate on outdoor, uneven concrete surfaces without assistive device or AFO independently (supervision may be provided only for cognitive reasons, not physical reasons)  Target 11/27/14   PT LONG TERM GOAL #8   Title Negotiate 4 stairs with single hand rail and reciprocal pattern.  Target 11/27/14   PT LONG TERM GOAL  #9   TITLE Demonstrate ability to step in/out of simulated tub without UE support, independently for increased independence with bathing.  Target 11/27/14           Plan - 10/19/14 1235    Clinical Impression  Statement Pt making steady progress toward goals. BP remained stable with sesison today. Challenged by strengthening and balance activities.   Rehab Potential Good   Clinical Impairments Affecting Rehab Potential decreased awareness of deficits   PT Frequency 2x / week   PT Duration 8 weeks   PT Treatment/Interventions ADLs/Self Care Home Management;Therapeutic activities;Patient/family education;Therapeutic exercise;DME Instruction;Gait training;Balance training;Neuromuscular re-education;Functional mobility training;Stair training   PT Next Visit Plan continue gait training without assistive device, and balance training  Problem List Patient Active Problem List   Diagnosis Date Noted  . Carotid artery occlusion with infarction 10/08/2014  . Essential hypertension 10/08/2014  . Hemi-neglect of left side 07/16/2014  . Spastic hemiplegia affecting nondominant side 07/10/2014  . Cognitive deficit, post-stroke 07/10/2014  . Sinus bradycardia on ECG 07/10/2014  . 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 CVA (cerebral vascular accident)   . Tobacco abuse   . HLD (hyperlipidemia)   . CVA (cerebral infarction) 07/06/2014    Willow Ora 10/19/2014, 1:45 PM  Willow Ora, PTA, [facility] [address] [place], [place] [address] [phone] 10/19/2014, 1:45 PM

## 2014-10-20 ENCOUNTER — Ambulatory Visit
Admission: RE | Admit: 2014-10-20 | Discharge: 2014-10-20 | Disposition: A | Discharge status: Home / Self Care | Payer: BLUE CROSS/BLUE SHIELD | Source: Ambulatory Visit | Attending: Neurology | Admitting: Neurology

## 2014-10-20 DIAGNOSIS — I63131 Cerebral infarction due to embolism of right carotid artery: Secondary | ICD-10-CM

## 2014-10-20 MED ORDER — IOPAMIDOL (ISOVUE-370) INJECTION 76%: 75.0000 mL | Freq: Once | INTRAVENOUS | Status: AC | PRN

## 2014-10-21 ENCOUNTER — Encounter: Payer: Self-pay | Admitting: *Deleted

## 2014-10-22 ENCOUNTER — Encounter: Payer: Self-pay | Admitting: Occupational Therapy

## 2014-10-22 ENCOUNTER — Ambulatory Visit: Payer: BLUE CROSS/BLUE SHIELD | Admitting: Physical Therapy

## 2014-10-22 ENCOUNTER — Ambulatory Visit: Payer: BLUE CROSS/BLUE SHIELD | Admitting: Occupational Therapy

## 2014-10-22 VITALS — BP 124/91 | HR 72

## 2014-10-22 VITALS — BP 139/92 | HR 79

## 2014-10-22 DIAGNOSIS — IMO0002 Reserved for concepts with insufficient information to code with codable children: Secondary | ICD-10-CM

## 2014-10-22 DIAGNOSIS — R262 Difficulty in walking, not elsewhere classified: Secondary | ICD-10-CM

## 2014-10-22 DIAGNOSIS — Z0289 Encounter for other administrative examinations: Secondary | ICD-10-CM

## 2014-10-22 DIAGNOSIS — I69398 Other sequelae of cerebral infarction: Secondary | ICD-10-CM | POA: Diagnosis not present

## 2014-10-22 DIAGNOSIS — G8194 Hemiplegia, unspecified affecting left nondominant side: Secondary | ICD-10-CM

## 2014-10-22 DIAGNOSIS — Z7409 Other reduced mobility: Secondary | ICD-10-CM

## 2014-10-22 NOTE — Therapy (Signed)
[facility] [address] [place], [place], [address] Phone: [phone]   Fax:  [phone]  Occupational Therapy Treatment  Patient Details  Name: Samuel Shaw MRN: [id number] Date of Birth: [birth year] Referring Provider:  Charlett Blake, MD  Encounter Date: 10/22/2014      OT End of Session - 10/22/14 1645    Visit Number 17   Number of Visits 20   Date for OT Re-Evaluation 10/27/14   Authorization Type pt is now on wife's insurance   OT Start Time 1530   OT Stop Time 1610   OT Time Calculation (min) 40 min   Activity Tolerance Patient tolerated treatment well      Past Medical History  Diagnosis Date  . Stroke   . Hyperlipidemia     History reviewed. No pertinent past surgical history.  Filed Vitals:   10/22/14 1539  BP: 124/91  Pulse: 72    Visit Diagnosis:  Hemiplegia affecting left nondominant side  Lack of coordination due to stroke      Subjective Assessment - 10/22/14 1536    Subjective  I didn't get a chance to try the newer exercise program yet   Pertinent History see epic snapshot, monitor BP   Currently in Pain? No/denies                      OT Treatments/Exercises (OP) - 10/22/14 0001    Neurological Re-education Exercises   Other Exercises 1 Mid to high level reaching activities with 2 pound weight with emphasis on normal alignment and movement patterns, decreasing trunk substitution for overhead reach, with fine motor task at end of reach. Pt required 3 rest breaks. BP did rise (see vital section) but with brief rest dropped.  Again stressed the impotance of  monitoring BP at home with exercises.                  OT Short Term Goals - 10/22/14 1643    OT SHORT TERM GOAL #1   Title Pt will be mod I with HEP - 09/01/2014   Status Achieved   OT SHORT TERM GOAL #2   Title Pt will be mod I with cutting with AE prn   Status Achieved   OT SHORT TERM  GOAL #3   Title Pt will be mod I with bathing   Status Achieved   OT SHORT TERM GOAL #4   Title Pt will be mod with toilet transfers   Status Achieved   OT SHORT TERM GOAL #5   Title Pt will be mod I with shower transfers  wife does not feel comfortable with mod I transfers   Status Achieved   OT SHORT TERM GOAL #6   Title Pt will be able to reach for 3 pound object on shelf with LUE at mid level   Status Achieved   OT SHORT TERM GOAL #7   Title Pt will require min a for basic organization for meal prep   Status Achieved           OT Long Term Goals - 10/22/14 1643    OT LONG TERM GOAL #1   Title Pt will be mod I with upgraded HEP - 09/29/2014   Status Achieved   OT LONG TERM GOAL #2   Title Pt will be able to reach at high reach level to obtain 3 pound object   Status Achieved   OT LONG TERM GOAL #  3   Title Pt will demonstrate improved coodination as evidenced by decreasing time on 9 hole peg test by 10 seconds to assist with functional tasks (baseline= 1.00.78)   Status Achieved  42.47   OT LONG TERM GOAL #4   Title Pt will improve in functional use of LUE as evidenced by increasing box and blocks by at least 5 blocks (baseline= 33)   Status Achieved  L= 46   OT LONG TERM GOAL #5   Title Pt will be mod I for simple meal prep at ambulatory level - 10/27/2014   Status Achieved   OT LONG TERM GOAL #6   Title Pt will be mod I with upgraded HEP   Status On-going   OT LONG TERM GOAL #7   Title Pt will be able to lift 7 pound object into overhead cabinet with LUE   Status Achieved   OT LONG TERM GOAL #8   Title Pt will be mod I with donning L shoe.   Status Achieved  pt no longer wearing brace   OT LONG TERM GOAL  #9   Baseline Pt will be mod I with shower transfers   Status Achieved               Plan - 10/22/14 1643    Clinical Impression Statement Pt making excellent progress toward LTG's. BP still fluctuating however staying within acceptable limits for  execise.   Pt will benefit from skilled therapeutic intervention in order to improve on the following deficits (Retired) Cardiopulmonary status limiting activity;Decreased activity tolerance;Decreased balance;Impaired UE functional use;Decreased strength;Decreased mobility;Decreased coordination;Decreased cognition   Rehab Potential Good   Clinical Impairments Affecting Rehab Potential cognition, left hemiplegia   OT Frequency 2x / week   OT Duration 4 weeks   OT Treatment/Interventions Self-care/ADL training;Moist Heat;Therapeutic exercise;Neuromuscular education;DME and/or AE instruction;Manual Therapy;Functional Mobility Training;Passive range of motion;Therapeutic activities;Cognitive remediation/compensation;Visual/perceptual remediation/compensation;Patient/family education;Balance training   Plan Finalize HEP, LUE strength and overhead reach, d/c from OT   Consulted and Agree with Plan of Care Patient        Problem List Patient Active Problem List   Diagnosis Date Noted  . Carotid artery occlusion with infarction 10/08/2014  . Essential hypertension 10/08/2014  . Hemi-neglect of left side 07/16/2014  . Spastic hemiplegia affecting nondominant side 07/10/2014  . Cognitive deficit, post-stroke 07/10/2014  . Sinus bradycardia on ECG 07/10/2014  . 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 CVA (cerebral vascular accident)   . Tobacco abuse   . HLD (hyperlipidemia)   . CVA (cerebral infarction) 07/06/2014    Quay Burow, OTR/L 10/22/2014, 4:46 PM  [facility] [address] [place], [place], [address] Phone: [phone]   Fax:  [phone]

## 2014-10-22 NOTE — Progress Notes (Signed)
Quick Note:  Mailed result letter to pt for CTA results, with Dr. Phoebe Sharps result note. Pt to call back if questions. ______

## 2014-10-22 NOTE — Therapy (Signed)
[facility] [address] [place], [place], [address] Phone: [phone]   Fax:  [phone]  Physical Therapy Treatment  Patient Details  Name: Samuel Shaw MRN: [id number] Date of Birth: 1960/08/02 Referring Provider:  Charlett Blake, MD  Encounter Date: 10/22/2014      PT End of Session - 10/22/14 1450    Visit Number 16   Number of Visits 17   Date for PT Re-Evaluation 11/27/14   Authorization Type [employer] 90 visit limit combined   PT Start Time 1447   PT Stop Time 1530   PT Time Calculation (min) 43 min      Past Medical History  Diagnosis Date  . Stroke   . Hyperlipidemia     No past surgical history on file.  Filed Vitals:   10/22/14 1450 10/22/14 1508 10/22/14 1527  BP: 112/80 132/84 139/92  Pulse:  58 79    Visit Diagnosis:  Difficulty walking  Lack of coordination due to stroke  Impaired functional mobility and activity tolerance      Subjective Assessment - 10/22/14 1450    Subjective No new complaints. No falls or pain to report   Currently in Pain? No/denies   Pain Score 0-No pain          [facility] Adult PT Treatment/Exercise - 10/22/14 1453    Ambulation/Gait   Ambulation/Gait Yes   Ambulation/Gait Assistance 4: Min guard   Ambulation/Gait Assistance Details cues for increased left hip/knee flexion with swing phase and for increased knee extension with stance phase. cues to relax UE and for arm swing with gait.                         Ambulation Distance (Feet) --  x1    Assistive device None   Gait Pattern Step-through pattern;Decreased stance time - left;Decreased step length - right;Decreased hip/knee flexion - left   Ambulation Surface Level;Indoor       Treatment continued: Along a 45-60 foot pathway - wide base of support in squat walking forward/backward ("cowboy") x 2 laps each way - side stepping while in squat position x 2 laps each way - diagonal  walking/stepping while in squat position forward/backward x 2 laps each way  At stairs - right stance with left foot taps to first 3 steps up and then down, emphasis on lifting the left foot, not sliding to to advance it. 2 sets of 10 reps of the up/down sequence with no UE support.  In parallel bars - with 8 inch box: alternating step ups with contralateral knee high march x 10 each forward, Light UE/fingertip support needed with these exercises.        PT Short Term Goals - 09/29/14 1218    PT SHORT TERM GOAL #1   Title Demonstrate correct performance of home exercise program to address balance, gait and strength impairments. Target: 09/04/14   Status Achieved   PT SHORT TERM GOAL #2   Title Increase Berg balance test score to 40/56 for improving balance.  Target: 09/04/14   Baseline met 09/01/14- 48/56   Status Achieved   PT SHORT TERM GOAL #3   Title Decrease TUG time to 35 seconds for improving efficiency with functional mobility with LRAD.  Target: 09/04/14   Baseline met 09/01/14- 18.0 sec's   Status Achieved   PT SHORT TERM GOAL #4   Title Begin gait training with either a single point cane or without  an assistive device (with AFO) as appropriate.  Target: 09/04/14   Baseline met by 09/01/14   Status Achieved   PT SHORT TERM GOAL #5   Title Demonstrate ability to stand x5 minutes unsupported with upper extremity task for increased activity tolerance.  Target: 09/04/14   Baseline met 09/01/14   Status Achieved   Additional Short Term Goals   Additional Short Term Goals Yes   PT SHORT TERM GOAL #6   Title Pt will demonstate ability to ascend/descend stairs with bilteral hand rail and reciprocal pattern to demonstrate increaed funcitonal strength of LLE and improved efficiency of home access. Target 10/30/14   PT SHORT TERM GOAL #7   Title Pt will demonstrate ability to step "in/out" of a simulated bathtub 3/3x with UE support for progress toward independent shower/tub transfers. Target  10/30/14   PT SHORT TERM GOAL #8   Title Pt will ambulate on even indoor surfaces without AFO and without assistive device, independently. Target 10/30/14           PT Long Term Goals - 09/29/14 1222    PT LONG TERM GOAL #1   Title Verbalize understanding of signs and risk factors of CVA.  Target: 10/03/14   PT LONG TERM GOAL #2   Title Increase Berg Balance Test score to 47/56 for decreased fall risk.  Target: 10/03/14   Baseline met 09/01/14- 48/56   Status Achieved   PT LONG TERM GOAL #3   Title Ambulate 500' on indoor level surfaces without assistive device independently for increased independence with household ambulation. Target 11/27/14   Baseline Extended to end of renewal plan of care.   Status On-going   PT LONG TERM GOAL #4   Title Increase gait speed without assistive device to >1.8 ft/sec for decreased fall risk. Target: 11/27/14   Baseline extended to end of renewal plan of care   Status On-going   PT LONG TERM GOAL #5   Title Perform TUG in <30 seconds without assistive device with safe turn, independently, for increased safety with household mobility. Target: 10/03/14   Additional Long Term Goals   Additional Long Term Goals Yes   PT LONG TERM GOAL #6   Title Increase FOTO stroke impact mobility scale to 70% for decreased disability due to CVA. Target: 11/27/14    Baseline extended to end of renewal plan of care   Status On-going   PT LONG TERM GOAL #7   Title Ambulate on outdoor, uneven concrete surfaces without assistive device or AFO independently (supervision may be provided only for cognitive reasons, not physical reasons)  Target 11/27/14   PT LONG TERM GOAL #8   Title Negotiate 4 stairs with single hand rail and reciprocal pattern.  Target 11/27/14   PT LONG TERM GOAL  #9   TITLE Demonstrate ability to step in/out of simulated tub without UE support, independently for increased independence with bathing.  Target 11/27/14           Plan - 10/22/14 1450    Clinical  Impression Statement Pt progressing toward goals. Able to perform higher level activities with on complaints and minimal increase in BP.    Rehab Potential Good   Clinical Impairments Affecting Rehab Potential decreased awareness of deficits   PT Frequency 2x / week   PT Duration 8 weeks   PT Treatment/Interventions ADLs/Self Care Home Management;Therapeutic activities;Patient/family education;Therapeutic exercise;DME Instruction;Gait training;Balance training;Neuromuscular re-education;Functional mobility training;Stair training   PT Next Visit Plan continue gait training without assistive device,  and balance training        Problem List Patient Active Problem List   Diagnosis Date Noted  . Carotid artery occlusion with infarction 10/08/2014  . Essential hypertension 10/08/2014  . Hemi-neglect of left side 07/16/2014  . Spastic hemiplegia affecting nondominant side 07/10/2014  . Cognitive deficit, post-stroke 07/10/2014  . Sinus bradycardia on ECG 07/10/2014  . 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 CVA (cerebral vascular accident)   . Tobacco abuse   . HLD (hyperlipidemia)   . CVA (cerebral infarction) 07/06/2014    Willow Ora 10/23/2014, 2:38 PM  Willow Ora, PTA, [facility] [address], [place], [place] [address] [phone] 10/23/2014, 2:38 PM

## 2014-10-26 ENCOUNTER — Ambulatory Visit: Payer: BLUE CROSS/BLUE SHIELD | Admitting: Occupational Therapy

## 2014-10-26 ENCOUNTER — Encounter: Payer: Self-pay | Admitting: Occupational Therapy

## 2014-10-26 ENCOUNTER — Ambulatory Visit: Payer: BLUE CROSS/BLUE SHIELD

## 2014-10-26 VITALS — BP 113/87 | HR 95

## 2014-10-26 VITALS — BP 117/92 | HR 101

## 2014-10-26 DIAGNOSIS — I69398 Other sequelae of cerebral infarction: Secondary | ICD-10-CM | POA: Diagnosis not present

## 2014-10-26 DIAGNOSIS — R262 Difficulty in walking, not elsewhere classified: Secondary | ICD-10-CM

## 2014-10-26 DIAGNOSIS — IMO0002 Reserved for concepts with insufficient information to code with codable children: Secondary | ICD-10-CM

## 2014-10-26 DIAGNOSIS — G8194 Hemiplegia, unspecified affecting left nondominant side: Secondary | ICD-10-CM

## 2014-10-26 NOTE — Therapy (Signed)
[facility] [address] [place], [place], [address] Phone: [phone]   Fax:  [phone]  Physical Therapy Treatment  Patient Details  Name: Samuel Shaw MRN: [id number] Date of Birth: 1961/03/21 Referring Provider:  Charlett Blake, MD  Encounter Date: 10/26/2014      PT End of Session - 10/26/14 1711    Visit Number 17   Date for PT Re-Evaluation 11/27/14   Authorization Type BC[employer] 90 visit limit combined   PT Start Time 4158   PT Stop Time 1532   PT Time Calculation (min) 44 min      Past Medical History  Diagnosis Date  . Stroke   . Hyperlipidemia     No past surgical history on file.  Filed Vitals:   10/26/14 1450 10/26/14 1511 10/26/14 1514 10/26/14 1529  BP: 110/81 125/92 113/87 117/92  Pulse: 75 101 94 101    Visit Diagnosis:  Lack of coordination due to stroke  Difficulty walking      Subjective Assessment - 10/26/14 1451    Subjective No new complaints. No falls or pain to report   Currently in Pain? No/denies      Stair training (gait training) -Initially with Bilat hand rails with step-to pattern with MOD I -progressed to bilat hand rails with alternating step  Pattern with verbal cues for knee extension in stance and for increased attention L foot for improved clearance initially with close supervision and progressed to MOD I -progressed to single hand rail with alternate step pattern with improved knee extension in stance with supervision then MOD I.    Gait training outside on uneven grass and concrete with supervision x500' with verbal cues for form/gait pattern. Pt tends to keep L knee stiff in ~20 degrees of flexion throughout gait pattern. Practiced rocking and part to whole practice in front of mirror.    Neuro re-ed Lateral Stepping over obstacle (simulated bathtub)  11" obstacle first with UE support, then without UE support then progressed to 15" obstacle without  UE support with frequent L foot catching on obstacle. Verbal cue to keep stance knee straight and to increase knee flexion on leg stepping over. This improved performance.                                PT Short Term Goals - 09/29/14 1218    PT SHORT TERM GOAL #1   Title Demonstrate correct performance of home exercise program to address balance, gait and strength impairments. Target: 09/04/14   Status Achieved   PT SHORT TERM GOAL #2   Title Increase Berg balance test score to 40/56 for improving balance.  Target: 09/04/14   Baseline met 09/01/14- 48/56   Status Achieved   PT SHORT TERM GOAL #3   Title Decrease TUG time to 35 seconds for improving efficiency with functional mobility with LRAD.  Target: 09/04/14   Baseline met 09/01/14- 18.0 sec's   Status Achieved   PT SHORT TERM GOAL #4   Title Begin gait training with either a single point cane or without an assistive device (with AFO) as appropriate.  Target: 09/04/14   Baseline met by 09/01/14   Status Achieved   PT SHORT TERM GOAL #5   Title Demonstrate ability to stand x5 minutes unsupported with upper extremity task for increased activity tolerance.  Target: 09/04/14   Baseline met 09/01/14   Status Achieved  Additional Short Term Goals   Additional Short Term Goals Yes   PT SHORT TERM GOAL #6   Title Pt will demonstate ability to ascend/descend stairs with bilteral hand rail and reciprocal pattern to demonstrate increaed funcitonal strength of LLE and improved efficiency of home access. Target 10/30/14   PT SHORT TERM GOAL #7   Title Pt will demonstrate ability to step "in/out" of a simulated bathtub 3/3x with UE support for progress toward independent shower/tub transfers. Target 10/30/14   PT SHORT TERM GOAL #8   Title Pt will ambulate on even indoor surfaces without AFO and without assistive device, independently. Target 10/30/14           PT Long Term Goals - 09/29/14 1222    PT LONG TERM GOAL #1   Title  Verbalize understanding of signs and risk factors of CVA.  Target: 10/03/14   PT LONG TERM GOAL #2   Title Increase Berg Balance Test score to 47/56 for decreased fall risk.  Target: 10/03/14   Baseline met 09/01/14- 48/56   Status Achieved   PT LONG TERM GOAL #3   Title Ambulate 500' on indoor level surfaces without assistive device independently for increased independence with household ambulation. Target 11/27/14   Baseline Extended to end of renewal plan of care.   Status On-going   PT LONG TERM GOAL #4   Title Increase gait speed without assistive device to >1.8 ft/sec for decreased fall risk. Target: 11/27/14   Baseline extended to end of renewal plan of care   Status On-going   PT LONG TERM GOAL #5   Title Perform TUG in <30 seconds without assistive device with safe turn, independently, for increased safety with household mobility. Target: 10/03/14   Additional Long Term Goals   Additional Long Term Goals Yes   PT LONG TERM GOAL #6   Title Increase FOTO stroke impact mobility scale to 70% for decreased disability due to CVA. Target: 11/27/14    Baseline extended to end of renewal plan of care   Status On-going   PT LONG TERM GOAL #7   Title Ambulate on outdoor, uneven concrete surfaces without assistive device or AFO independently (supervision may be provided only for cognitive reasons, not physical reasons)  Target 11/27/14   PT LONG TERM GOAL #8   Title Negotiate 4 stairs with single hand rail and reciprocal pattern.  Target 11/27/14   PT LONG TERM GOAL  #9   TITLE Demonstrate ability to step in/out of simulated tub without UE support, independently for increased independence with bathing.  Target 11/27/14               Plan - 10/26/14 1712    Clinical Impression Statement Continues to progress toward goals. Will check goals next visit. Continue per POC. BP more stable today, and when it was slightly elevated, it decreased within 2 minutes.   PT Next Visit Plan Check goals         Problem List Patient Active Problem List   Diagnosis Date Noted  . Carotid artery occlusion with infarction 10/08/2014  . Essential hypertension 10/08/2014  . Hemi-neglect of left side 07/16/2014  . Spastic hemiplegia affecting nondominant side 07/10/2014  . Cognitive deficit, post-stroke 07/10/2014  . Sinus bradycardia on ECG 07/10/2014  . 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 CVA (cerebral vascular accident)   . Tobacco abuse   . HLD (hyperlipidemia)   . CVA (cerebral infarction) 07/06/2014    Anderson Malta  Keeanna Villafranca, PT,DPT,NCS 10/26/2014 5:14 PM Phone [phone] FAX [phone]          [facility] [address] [place], [place], [address] Phone: [phone]   Fax:  [phone]

## 2014-10-26 NOTE — Patient Instructions (Signed)
How to increase the challenge of your home program as your left arm gets stronger. Please make sure you closely monitor your BP before and after your exercises.  Your bottom number should not go above 100. If it does STOP the exercise and contact Dr. Criss Rosales.  Arm exercises: 1. Increase the number of repetitions ( increase slowly - no more than 5 at a time).  You can start by increasing one or two exercises and if this goes ok you can increase the rest.  2.  Increase the weight - do this SLOWLY!  Do not increase more than 2 pounds at a time. 3. Increase the number of times you do the program per week.  Theraputty: You have the red putty. I gave you the green and the blue.  The red is the easiest, the green the next and the blue is the toughest. 1. Slowly transition your program to doing the whole thing with the green putty. Once that feels easy can transition to the blue putty. Transition this slowly - do not do all the exercises with the harder putty all at once.  2. Increase the number of repetitions of each exercise. 3. Increase the number of times per week you do the program.  Fine Motor Coordination Program: 1. Use smaller objects. 2. Increase the speed you do the activity while maintaining the accuracy 3. Increase the amount of time you spend on each session (right now you do 15-20 minutes) 4. Increase the number of times you do the program per week.  Increase one program at a time and increase only one thing at a time. You can vary what you increase from day to day. STOP if you get pain and go back to what you were doing. Try again later.  If you increase too many things at once and then have a problem you won't know what caused the problem.

## 2014-10-26 NOTE — Therapy (Signed)
[facility] [address] [place], [place], [address] Phone: [phone]   Fax:  [phone]  Occupational Therapy Treatment  Patient Details  Name: Samuel Shaw MRN: [id number] Date of Birth: 05-14-1961 Referring Provider:  Charlett Blake, MD  Encounter Date: 10/26/2014      OT End of Session - 10/26/14 1616    Visit Number 18   Number of Visits 20   Date for OT Re-Evaluation 10/27/14   Authorization Type pt is now on wife's insurance   OT Start Time 1531   OT Stop Time 1610   OT Time Calculation (min) 39 min   Activity Tolerance Patient tolerated treatment well      Past Medical History  Diagnosis Date  . Stroke   . Hyperlipidemia     History reviewed. No pertinent past surgical history.  Filed Vitals:   10/26/14 1535  BP: 113/87  Pulse: 95    Visit Diagnosis:  Hemiplegia affecting left nondominant side  Lack of coordination due to stroke      Subjective Assessment - 10/26/14 1536    Pertinent History see epic snapshot, monitor BP   Currently in Pain? No/denies                      OT Treatments/Exercises (OP) - 10/26/14 0001    Exercises   Exercises Hand   Hand Exercises   Theraputty Flatten;Roll;Grip;Locate Pegs  20 pegs in bue theraputty   Other Hand Exercises Pt with improvement in 9 hole peg test to 30.10 (baseline= 100.78), box and blocks to 51 (baseline = 33). Pt able to lift 8 pound object off shelf. Pt given instructions on how to increase challenge of HEP since he is being d/c from OT today. Pt verbalized understanding. Stressed importance of monitoring BP and instructed to contact Dr. Criss Rosales if there is an issue. Pt agreeable.                OT Education - 10/26/14 1610    Education provided Yes   Education Details how to increase challenge for HEP after d/c   Person(s) Educated Patient   Methods Explanation;Handout   Comprehension Verbalized  understanding          OT Short Term Goals - 10/26/14 1614    OT SHORT TERM GOAL #1   Title Pt will be mod I with HEP - 09/01/2014   Status Achieved   OT SHORT TERM GOAL #2   Title Pt will be mod I with cutting with AE prn   Status Achieved   OT SHORT TERM GOAL #3   Title Pt will be mod I with bathing   Status Achieved   OT SHORT TERM GOAL #4   Title Pt will be mod with toilet transfers   Status Achieved   OT SHORT TERM GOAL #5   Title Pt will be mod I with shower transfers  wife does not feel comfortable with mod I transfers   Status Achieved   OT SHORT TERM GOAL #6   Title Pt will be able to reach for 3 pound object on shelf with LUE at mid level   Status Achieved   OT SHORT TERM GOAL #7   Title Pt will require min a for basic organization for meal prep   Status Achieved           OT Long Term Goals - 10/26/14 1615    OT LONG TERM GOAL #1  Title Pt will be mod I with upgraded HEP - 09/29/2014   Status Achieved   OT LONG TERM GOAL #2   Title Pt will be able to reach at high reach level to obtain 3 pound object   Status Achieved   OT LONG TERM GOAL #3   Title Pt will demonstrate improved coodination as evidenced by decreasing time on 9 hole peg test by 10 seconds to assist with functional tasks (baseline= 1.00.78)   Status Achieved  42.47   OT LONG TERM GOAL #4   Title Pt will improve in functional use of LUE as evidenced by increasing box and blocks by at least 5 blocks (baseline= 33)   Status Achieved  L= 46   OT LONG TERM GOAL #5   Title Pt will be mod I for simple meal prep at ambulatory level - 10/27/2014   Status Achieved   OT LONG TERM GOAL #6   Title Pt will be mod I with upgraded HEP   Status Achieved   OT LONG TERM GOAL #7   Title Pt will be able to lift 7 pound object into overhead cabinet with LUE   Status Achieved   OT LONG TERM GOAL #8   Title Pt will be mod I with donning L shoe.   Status Achieved  pt no longer wearing brace   OT LONG TERM  GOAL  #9   Baseline Pt will be mod I with shower transfers   Status Achieved               Plan - 10/26/14 1615    Clinical Impression Statement Pt has made excellent progress and has now met all LTG's. Pt stated he is very pleased with his progress and feels he can continue to work on things at home as needed. Pt reports "I use my left hand for everything now"   Pt will benefit from skilled therapeutic intervention in order to improve on the following deficits (Retired) Cardiopulmonary status limiting activity;Decreased activity tolerance;Decreased balance;Impaired UE functional use;Decreased strength;Decreased mobility;Decreased coordination;Decreased cognition   Rehab Potential Good   Clinical Impairments Affecting Rehab Potential cognition, left hemiplegia   OT Frequency 2x / week   OT Duration 4 weeks   OT Treatment/Interventions Self-care/ADL training;Moist Heat;Therapeutic exercise;Neuromuscular education;DME and/or AE instruction;Manual Therapy;Functional Mobility Training;Passive range of motion;Therapeutic activities;Cognitive remediation/compensation;Visual/perceptual remediation/compensation;Patient/family education;Balance training   Plan d/c from [facility] and Agree with Plan of Care Patient        Problem List Patient Active Problem List   Diagnosis Date Noted  . Carotid artery occlusion with infarction 10/08/2014  . Essential hypertension 10/08/2014  . Hemi-neglect of left side 07/16/2014  . Spastic hemiplegia affecting nondominant side 07/10/2014  . Cognitive deficit, post-stroke 07/10/2014  . Sinus bradycardia on ECG 07/10/2014  . 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 CVA (cerebral vascular accident)   . Tobacco abuse   . HLD (hyperlipidemia)   . CVA (cerebral infarction) 07/06/2014   OCCUPATIONAL THERAPY DISCHARGE SUMMARY  Visits from Start of Care: 18  Current functional level related to goals /  functional outcomes: See above goals   Remaining deficits: Mild LUE proximal weakness, mild decreased coordination   Education / Equipment: HEP Plan: Patient agrees to discharge.  Patient goals were met. Patient is being discharged due to meeting the stated rehab goals.  ?????      Quay Burow, OTR/L 10/26/2014, 4:18 PM  [facility]  [facility] [address] [place], [place], [address] Phone: [phone]   Fax:  [phone]

## 2014-10-27 ENCOUNTER — Ambulatory Visit (INDEPENDENT_AMBULATORY_CARE_PROVIDER_SITE_OTHER): Payer: BLUE CROSS/BLUE SHIELD

## 2014-10-27 ENCOUNTER — Other Ambulatory Visit: Payer: BLUE CROSS/BLUE SHIELD

## 2014-10-27 DIAGNOSIS — I63131 Cerebral infarction due to embolism of right carotid artery: Secondary | ICD-10-CM

## 2014-10-27 DIAGNOSIS — Z0289 Encounter for other administrative examinations: Secondary | ICD-10-CM

## 2014-10-28 ENCOUNTER — Other Ambulatory Visit: Payer: BLUE CROSS/BLUE SHIELD

## 2014-10-28 ENCOUNTER — Ambulatory Visit: Payer: BLUE CROSS/BLUE SHIELD | Admitting: Physical Therapy

## 2014-10-28 ENCOUNTER — Encounter: Payer: Self-pay | Admitting: Physical Therapy

## 2014-10-28 ENCOUNTER — Encounter: Payer: Self-pay | Admitting: Occupational Therapy

## 2014-10-28 VITALS — BP 126/95 | HR 105

## 2014-10-28 DIAGNOSIS — R262 Difficulty in walking, not elsewhere classified: Secondary | ICD-10-CM

## 2014-10-28 DIAGNOSIS — IMO0002 Reserved for concepts with insufficient information to code with codable children: Secondary | ICD-10-CM

## 2014-10-28 DIAGNOSIS — I69398 Other sequelae of cerebral infarction: Secondary | ICD-10-CM | POA: Diagnosis not present

## 2014-10-28 DIAGNOSIS — Z7409 Other reduced mobility: Secondary | ICD-10-CM

## 2014-10-28 NOTE — Therapy (Signed)
[facility] [address] [place], [place], [address] Phone: [phone]   Fax:  [phone]  Physical Therapy Treatment  Patient Details  Name: MURRY DIAZ MRN: [id number] Date of Birth: 05/13/1961 Referring Provider:  Charlett Blake, MD  Encounter Date: 10/28/2014      PT End of Session - 10/28/14 1536    Visit Number 18   Date for PT Re-Evaluation 11/27/14   Authorization Type [employer] 90 visit limit combined   PT Start Time 1535   PT Stop Time 1615   PT Time Calculation (min) 40 min   Equipment Utilized During Treatment Gait belt   Activity Tolerance Patient tolerated treatment well   Behavior During Therapy [facility] for tasks assessed/performed      Past Medical History  Diagnosis Date  . Stroke   . Hyperlipidemia     History reviewed. No pertinent past surgical history.  Filed Vitals:   10/28/14 1536- before session 10/28/14 1615- after session  BP: 126/84 126/95  Pulse: 81 105    Visit Diagnosis:  Lack of coordination due to stroke  Difficulty walking  Impaired functional mobility and activity tolerance      Subjective Assessment - 10/28/14 1536    Subjective No new complaints. No falls or pain to report   Currently in Pain? No/denies   Pain Score 0-No pain          [facility] Adult PT Treatment/Exercise - 10/28/14 1541    Ambulation/Gait   Stairs Yes   Stairs Assistance 5: Supervision;6: Modified independent (Device/Increase time)   Stairs Assistance Details (indicate cue type and reason) no cues needed, increased time needed with descending stairs   Stair Management Technique One rail Right;Alternating pattern;Forwards   Number of Stairs 4  x 3 reps     tub simiulation: tub transfer bench at position of pt's at home next to mat table (back of tub) with balance beam on side (the higher side up) and the parallel divider board laid on top to achieve the tub height. Had a second chair  turned backward so to get the other tub wall simulated. Pt able to sit on tub transfer bench and lift bil legs in/out of tub sets of 10 reps.    Neuro re ed Along a 40 foot pathway: min guard assist and cues on ex form/technique Forward lunges x 2 laps Cowboy walk forward/backward x 2 laps each Squat diagonal walking forward/backward x 2 laps each  In parallel bars Airex<>8 inch box step: alternating forward step up with contralateral high knee march (this foot not touching box) x 10 each side with light UE assist on bars for balance.       PT Short Term Goals - 10/28/14 1537    PT SHORT TERM GOAL #1   Title Demonstrate correct performance of home exercise program to address balance, gait and strength impairments. Target: 09/04/14   Status Achieved   PT SHORT TERM GOAL #2   Title Increase Berg balance test score to 40/56 for improving balance.  Target: 09/04/14   Baseline met 09/01/14- 48/56   Status Achieved   PT SHORT TERM GOAL #3   Title Decrease TUG time to 35 seconds for improving efficiency with functional mobility with LRAD.  Target: 09/04/14   Baseline met 09/01/14- 18.0 sec's   Status Achieved   PT SHORT TERM GOAL #4   Title Begin gait training with either a single point cane or without an assistive device (with AFO)  as appropriate.  Target: 09/04/14   Baseline met by 09/01/14   Status Achieved   PT SHORT TERM GOAL #5   Title Demonstrate ability to stand x5 minutes unsupported with upper extremity task for increased activity tolerance.  Target: 09/04/14   Baseline met 09/01/14   Status Achieved   PT SHORT TERM GOAL #6   Title Pt will demonstate ability to ascend/descend stairs with bilteral hand rail and reciprocal pattern to demonstrate increaed funcitonal strength of LLE and improved efficiency of home access. Target 10/30/14   Status Achieved   PT SHORT TERM GOAL #7   Title Pt will demonstrate ability to step "in/out" of a simulated bathtub 3/3x with UE support for progress toward  independent shower/tub transfers. Target 10/30/14   Status Achieved   PT SHORT TERM GOAL #8   Title Pt will ambulate on even indoor surfaces without AFO and without assistive device, independently. Target 10/30/14   Status Achieved           PT Long Term Goals - 09/29/14 1222    PT LONG TERM GOAL #1   Title Verbalize understanding of signs and risk factors of CVA.  Target: 10/03/14   PT LONG TERM GOAL #2   Title Increase Berg Balance Test score to 47/56 for decreased fall risk.  Target: 10/03/14   Baseline met 09/01/14- 48/56   Status Achieved   PT LONG TERM GOAL #3   Title Ambulate 500' on indoor level surfaces without assistive device independently for increased independence with household ambulation. Target 11/27/14   Baseline Extended to end of renewal plan of care.   Status On-going   PT LONG TERM GOAL #4   Title Increase gait speed without assistive device to >1.8 ft/sec for decreased fall risk. Target: 11/27/14   Baseline extended to end of renewal plan of care   Status On-going   PT LONG TERM GOAL #5   Title Perform TUG in <30 seconds without assistive device with safe turn, independently, for increased safety with household mobility. Target: 10/03/14   Additional Long Term Goals   Additional Long Term Goals Yes   PT LONG TERM GOAL #6   Title Increase FOTO stroke impact mobility scale to 70% for decreased disability due to CVA. Target: 11/27/14    Baseline extended to end of renewal plan of care   Status On-going   PT LONG TERM GOAL #7   Title Ambulate on outdoor, uneven concrete surfaces without assistive device or AFO independently (supervision may be provided only for cognitive reasons, not physical reasons)  Target 11/27/14   PT LONG TERM GOAL #8   Title Negotiate 4 stairs with single hand rail and reciprocal pattern.  Target 11/27/14   PT LONG TERM GOAL  #9   TITLE Demonstrate ability to step in/out of simulated tub without UE support, independently for increased independence  with bathing.  Target 11/27/14            Plan - 10/28/14 1537    Clinical Impression Statement Pt making steady progress toward goals. BP stable today with activity. focued on hip/knee flexion and strengthening activities without issues.   Rehab Potential Good   Clinical Impairments Affecting Rehab Potential decreased awareness of deficits   PT Frequency 2x / week   PT Treatment/Interventions ADLs/Self Care Home Management;Therapeutic activities;Patient/family education;Therapeutic exercise;DME Instruction;Gait training;Balance training;Neuromuscular re-education;Functional mobility training;Stair training   PT Next Visit Plan continue toward LTGs   Consulted and Agree with Plan of Care Patient;Family member/caregiver  Family Member Consulted spouse        Problem List Patient Active Problem List   Diagnosis Date Noted  . Carotid artery occlusion with infarction 10/08/2014  . Essential hypertension 10/08/2014  . Hemi-neglect of left side 07/16/2014  . Spastic hemiplegia affecting nondominant side 07/10/2014  . Cognitive deficit, post-stroke 07/10/2014  . Sinus bradycardia on ECG 07/10/2014  . 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 CVA (cerebral vascular accident)   . Tobacco abuse   . HLD (hyperlipidemia)   . CVA (cerebral infarction) 07/06/2014    Willow Ora 10/29/2014, 7:39 PM  Willow Ora, PTA, [facility] [address] [place], [place] [address] [phone] 10/29/2014, 7:39 PM

## 2014-10-29 ENCOUNTER — Ambulatory Visit: Payer: Self-pay

## 2014-11-02 ENCOUNTER — Ambulatory Visit: Payer: BLUE CROSS/BLUE SHIELD | Attending: Physical Medicine & Rehabilitation

## 2014-11-02 VITALS — BP 104/84 | HR 85

## 2014-11-02 DIAGNOSIS — I69354 Hemiplegia and hemiparesis following cerebral infarction affecting left non-dominant side: Secondary | ICD-10-CM | POA: Insufficient documentation

## 2014-11-02 DIAGNOSIS — E785 Hyperlipidemia, unspecified: Secondary | ICD-10-CM | POA: Insufficient documentation

## 2014-11-02 DIAGNOSIS — R41841 Cognitive communication deficit: Secondary | ICD-10-CM | POA: Diagnosis not present

## 2014-11-02 DIAGNOSIS — R262 Difficulty in walking, not elsewhere classified: Secondary | ICD-10-CM

## 2014-11-02 DIAGNOSIS — I69398 Other sequelae of cerebral infarction: Secondary | ICD-10-CM | POA: Insufficient documentation

## 2014-11-02 DIAGNOSIS — R279 Unspecified lack of coordination: Secondary | ICD-10-CM | POA: Diagnosis not present

## 2014-11-02 DIAGNOSIS — F17200 Nicotine dependence, unspecified, uncomplicated: Secondary | ICD-10-CM | POA: Diagnosis not present

## 2014-11-02 DIAGNOSIS — IMO0002 Reserved for concepts with insufficient information to code with codable children: Secondary | ICD-10-CM

## 2014-11-02 NOTE — Therapy (Deleted)
[facility] [address] [place], [place], [address] Phone: [phone]   Fax:  [phone]  Patient Details  Name: Samuel Shaw MRN: [id number] Date of Birth: Jun 06, 1961 Referring Provider:  Charlett Blake, MD  Encounter Date: 11/02/2014   Harriet Masson 11/02/2014, 2:45 PM  [facility] [address] [place], [place], [address] Phone: [phone]   Fax:  [phone]

## 2014-11-02 NOTE — Therapy (Signed)
Performed this [facility] [address] [place], [place], [address] Phone: [phone]   Fax:  [phone]  Physical Therapy Treatment  Patient Details  Name: Samuel Shaw MRN: [id number] Date of Birth: 1960-09-17 Referring Provider:  Charlett Blake, MD  Encounter Date: 11/02/2014      PT End of Session - 11/02/14 1535    Visit Number 19   Date for PT Re-Evaluation 11/27/14   Authorization Type BCBS 90 visit limit combined   PT Start Time 1402   PT Stop Time 1445   PT Time Calculation (min) 43 min   Equipment Utilized During Treatment Gait belt      Past Medical History  Diagnosis Date  . Stroke   . Hyperlipidemia     No past surgical history on file.  Filed Vitals:   11/02/14 1406 11/02/14 1417 11/02/14 1435 11/02/14 1438  BP: 116/77 116/83 101/82 104/84  Pulse: 59 69 92 85    Visit Diagnosis:  Lack of coordination due to stroke  Difficulty walking      Subjective Assessment - 11/02/14 1405    Subjective Pt reports getting in and out of the shower and putting on clothes is easier and he is doing them independently.   Currently in Pain? No/denies             Gait training: Initially dynamic gait training with lunge walking x75' with CGA, then with March-walking x75'; agility side step-walking with increased RLE stance time noted; agility forward galloping with increased RLE stance time.  level surface gait training x200' with excessive L knee flexion in stance lacking knee flexion in pre-swing and poor LLE push off through toe. Trialed gait training on treadmill 2x1-2 minutes with pt demonstrating increased difficulty with heel strike, knee extension in stance (performed with BUE support, verbal, visual and tactile cues for form). Gait training on outdoor, uneven concrete x450' with close supervision with same deficits noted as mentioned above. Also trialed gait training on uneven grassy  surface with MIN A to steady with decreased L foot clearance in swing noted and increased unsteadiness. Upon re-entering gym pt briefly demonstrated improved knee extension in stance and improved preswing push off of toe but this only lasted about 50'. Continued gait training at counter with therapist providing step by step demo of each phase of the gait pattern with pt returning demonstration but unable to consistently translate this into pt's walking pattern. Finally, gait training  In parallel bars with intermittent BUE support on bars with pt stepping on compliant blue mat with compliant stepping stones underneath to improve balance on compliant surfaces. Performed this forward, backward and lateral.                        PT Short Term Goals - 10/28/14 1537    PT SHORT TERM GOAL #1   Title Demonstrate correct performance of home exercise program to address balance, gait and strength impairments. Target: 09/04/14   Status Achieved   PT SHORT TERM GOAL #2   Title Increase Berg balance test score to 40/56 for improving balance.  Target: 09/04/14   Baseline met 09/01/14- 48/56   Status Achieved   PT SHORT TERM GOAL #3   Title Decrease TUG time to 35 seconds for improving efficiency with functional mobility with LRAD.  Target: 09/04/14   Baseline met 09/01/14- 18.0 sec's   Status Achieved   PT SHORT TERM GOAL #4  Title Begin gait training with either a single point cane or without an assistive device (with AFO) as appropriate.  Target: 09/04/14   Baseline met by 09/01/14   Status Achieved   PT SHORT TERM GOAL #5   Title Demonstrate ability to stand x5 minutes unsupported with upper extremity task for increased activity tolerance.  Target: 09/04/14   Baseline met 09/01/14   Status Achieved   PT SHORT TERM GOAL #6   Title Pt will demonstate ability to ascend/descend stairs with bilteral hand rail and reciprocal pattern to demonstrate increaed funcitonal strength of LLE and improved efficiency  of home access. Target 10/30/14   Status Achieved   PT SHORT TERM GOAL #7   Title Pt will demonstrate ability to step "in/out" of a simulated bathtub 3/3x with UE support for progress toward independent shower/tub transfers. Target 10/30/14   Status Achieved   PT SHORT TERM GOAL #8   Title Pt will ambulate on even indoor surfaces without AFO and without assistive device, independently. Target 10/30/14   Status Achieved           PT Long Term Goals - 09/29/14 1222    PT LONG TERM GOAL #1   Title Verbalize understanding of signs and risk factors of CVA.  Target: 10/03/14   PT LONG TERM GOAL #2   Title Increase Berg Balance Test score to 47/56 for decreased fall risk.  Target: 10/03/14   Baseline met 09/01/14- 48/56   Status Achieved   PT LONG TERM GOAL #3   Title Ambulate 500' on indoor level surfaces without assistive device independently for increased independence with household ambulation. Target 11/27/14   Baseline Extended to end of renewal plan of care.   Status On-going   PT LONG TERM GOAL #4   Title Increase gait speed without assistive device to >1.8 ft/sec for decreased fall risk. Target: 11/27/14   Baseline extended to end of renewal plan of care   Status On-going   PT LONG TERM GOAL #5   Title Perform TUG in <30 seconds without assistive device with safe turn, independently, for increased safety with household mobility. Target: 10/03/14   Additional Long Term Goals   Additional Long Term Goals Yes   PT LONG TERM GOAL #6   Title Increase FOTO stroke impact mobility scale to 70% for decreased disability due to CVA. Target: 11/27/14    Baseline extended to end of renewal plan of care   Status On-going   PT LONG TERM GOAL #7   Title Ambulate on outdoor, uneven concrete surfaces without assistive device or AFO independently (supervision may be provided only for cognitive reasons, not physical reasons)  Target 11/27/14   PT LONG TERM GOAL #8   Title Negotiate 4 stairs with single hand  rail and reciprocal pattern.  Target 11/27/14   PT LONG TERM GOAL  #9   TITLE Demonstrate ability to step in/out of simulated tub without UE support, independently for increased independence with bathing.  Target 11/27/14               Plan - 11/02/14 1536    Clinical Impression Statement Pt's BP was stable today with activity however, at times his BP has unpredictable response to therapy: slight drop with activity and slight rise with rest. Physician already aware of this as oit occurred during prior OT sessions. Will continue to monitor in future sessions. Carry over of improved gait pattern from prior sessions was very limited.  Plant to continue per POC  with maximal focus on pt's balanc on various surface rather than perfoecting the gait pattern.    PT Next Visit Plan balance on various compliant surfaces; gait speed   Consulted and Agree with Plan of Care Patient        Problem List Patient Active Problem List   Diagnosis Date Noted  . Carotid artery occlusion with infarction 10/08/2014  . Essential hypertension 10/08/2014  . Hemi-neglect of left side 07/16/2014  . Spastic hemiplegia affecting nondominant side 07/10/2014  . Cognitive deficit, post-stroke 07/10/2014  . Sinus bradycardia on ECG 07/10/2014  . 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 CVA (cerebral vascular accident)   . Tobacco abuse   . HLD (hyperlipidemia)   . CVA (cerebral infarction) 07/06/2014   Delrae Sawyers, PT,DPT,NCS 11/02/2014 3:57 PM Phone [phone] FAX [phone]         [facility] [address] [place], [place], [address] Phone: [phone]   Fax:  [phone]

## 2014-11-03 ENCOUNTER — Ambulatory Visit (HOSPITAL_BASED_OUTPATIENT_CLINIC_OR_DEPARTMENT_OTHER): Payer: BLUE CROSS/BLUE SHIELD | Admitting: Physical Medicine & Rehabilitation

## 2014-11-03 ENCOUNTER — Encounter: Payer: BLUE CROSS/BLUE SHIELD | Attending: Physical Medicine & Rehabilitation

## 2014-11-03 ENCOUNTER — Encounter: Payer: Self-pay | Admitting: Physical Medicine & Rehabilitation

## 2014-11-03 VITALS — BP 116/75 | HR 54 | Resp 16

## 2014-11-03 DIAGNOSIS — I6931 Cognitive deficits following cerebral infarction: Secondary | ICD-10-CM | POA: Diagnosis not present

## 2014-11-03 DIAGNOSIS — I63511 Cerebral infarction due to unspecified occlusion or stenosis of right middle cerebral artery: Secondary | ICD-10-CM | POA: Diagnosis not present

## 2014-11-03 DIAGNOSIS — R414 Neurologic neglect syndrome: Secondary | ICD-10-CM

## 2014-11-03 DIAGNOSIS — G8110 Spastic hemiplegia affecting unspecified side: Secondary | ICD-10-CM

## 2014-11-03 NOTE — Patient Instructions (Signed)
Start walking around your neighborhood. Start out at around 10-15 minutes and then only increased by 5 minutes per week with a goal of 30 minutes per day

## 2014-11-03 NOTE — Progress Notes (Signed)
Subjective:    Patient ID: Samuel Shaw, male    DOB: [birth year], 54 y.o.   MRN: [id number] 53 y.o. male with history of tobacco abuse, TIA 2 years ago, CVA late December with recent discharge from hospital in [place] where he was visiting and maintained on aspirin 325 mg daily. Patient lives with his wife in [place]. Presented 07/06/2014 with progressive left-sided weakness and slurred speech since recent discharge from the hospital. CT scan reviewed from outside hospital in December showed acute right basal ganglia infarct. There was possible right ICA stenosis identified on carotid ultrasound. MRI of the brain 07/06/2014 showed acute multifocal infarcts within right middle cerebral artery territory including right basal ganglia, right anterior occipital and right middle cerebral artery territory/ watershed. 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 CTA of the neck showed right ICA occlusion 1 cm above its origin. Echocardiogram showed ejection fraction 60% no wall motion abnormalities  HPI  Has been back to neurology.  Has not been approved to drive yet. Continues with outpatient PT and OT Had repeat CT angiogram of the head and neck which showed no change in occlusion proximal right ICA Also had in office [facility] at [facility] neurologic and is following up with his neurologist for this  Pain Inventory Average Pain 0 Pain Right Now 0 My pain is no pain  In the last 24 hours, has pain interfered with the following? General activity 0 Relation with others 0 Enjoyment of life 0 What TIME of day is your pain at its worst? no pain Sleep (in general) Good  Pain is worse with: no pain Pain improves with: no pain Relief from Meds: no pain  Mobility walk with assistance use a cane how many minutes can you walk? 20 ability to climb steps?  yes do you drive?  no  Function not employed: date last employed 2015 I need assistance with the following:  dressing,  bathing, toileting, meal prep, household duties and shopping  Neuro/Psych weakness  Prior Studies Any changes since last visit?  no  Physicians involved in your care Any changes since last visit?  no   Family History  Problem Relation Age of Onset  . Diabetes Mother    History   Social History  . Marital Status: Married    Spouse Name: [name]  . Number of Children: 3  . Years of Education: 2y coll   Occupational History  .      not working at this time   Social History Main Topics  . Smoking status: Former Smoker -- 1.00 packs/day for 15 years    Types: Cigarettes    Quit date: 06/26/2014  . Smokeless tobacco: Never Used  . Alcohol Use: No  . Drug Use: No  . Sexual Activity: Not on file   Other Topics Concern  . None   Social History Narrative   Caffeine.3 cups coffee, daily.   No past surgical history on file. Past Medical History  Diagnosis Date  . Stroke   . Hyperlipidemia    BP 116/75 mmHg  Pulse 54  Resp 16  SpO2 98%  Opioid Risk Score:   Fall Risk Score: Low Fall Risk (0-5 points) (educated and given handout on fall prevention in the home today)`1  Depression screen PHQ 2/9  Depression screen PHQ 2/9 10/05/2014  Decreased Interest 1  Down, Depressed, Hopeless 0  PHQ - 2 Score 1  Altered sleeping 0  Tired, decreased energy 0  Change in appetite  0  Feeling bad or failure about yourself  0  Trouble concentrating 0  Moving slowly or fidgety/restless 1  Suicidal thoughts 0  PHQ-9 Score 2    Review of Systems  Neurological: Positive for weakness.  All other systems reviewed and are negative.      Objective:   Physical Exam  Constitutional: He appears well-developed and well-nourished.  HENT:  Head: Normocephalic and atraumatic.  Eyes: Pupils are equal, round, and reactive to light.  Neurological: He is alert. He exhibits abnormal muscle tone. Gait abnormal.  Reflex Scores:      Tricep reflexes are 1+ on the right side and 2+ on the  left side.      Bicep reflexes are 1+ on the right side and 2+ on the left side.      Brachioradialis reflexes are 1+ on the right side and 2+ on the left side.      Patellar reflexes are 1+ on the right side and 3+ on the left side.      Achilles reflexes are 1+ on the right side and 1+ on the left side. Motor strength is 4 minus left deltoid, biceps, triceps, grip, 4 at the left hip flexor and extensor ankle to flex plantar flexion 5/5 in the right deltoid, biceps, triceps, grip, hip flexor, knee extensor, ankle dorsi flexor plantar flexion Increased tone in the left lower extremity with increased extensor tone Ambulates with slight toe drag, no longer wears AFO No evidence of knee instability Uses quad cane  Psychiatric: His affect is blunt. He is slowed and withdrawn.  Nursing note and vitals reviewed.         Assessment & Plan:  1. 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  He has made overall excellent recovery from his stroke however he does have residual left hemiparesis, left-sided spasticity as well as balance disorder. His Left-sided attention is reduced and at this point would not want him to return to driving.I do think he would benefit from continued PT OT neuropsych may be advisable although his wife states that he is normally very shy and very quiet and he is not much different than usual in this respect. PH Q9 score is low at 2, This was done last month  Return to clinic in 4 weeks He will need to follow up with neurology on his imaging studies.

## 2014-11-05 ENCOUNTER — Ambulatory Visit: Payer: Self-pay

## 2014-11-05 ENCOUNTER — Telehealth: Payer: Self-pay | Admitting: *Deleted

## 2014-11-05 ENCOUNTER — Telehealth: Payer: Self-pay | Admitting: Neurology

## 2014-11-05 NOTE — Telephone Encounter (Signed)
[name], Pt's wife called wanting to go over patients results for the test he had done in April. She did not remember the name of the tests. Please call and advice # [phone]

## 2014-11-05 NOTE — Telephone Encounter (Signed)
Spoke with [name], patient's wife who states he had a test done in this office on 10/27/14, and they never received the results. Informed her will route her request to Dr Erlinda Hong. She verbalized understanding.

## 2014-11-05 NOTE — Telephone Encounter (Signed)
Discussed with wife over the phone and delivery CT and [place] results to her. Answered her questions to her satisfaction.  Mary, could you please setup an appointment for him to see me in 3-4 months? Thanks.  Rosalin Hawking, MD PhD Stroke Neurology 11/05/2014 4:20 PM

## 2014-11-05 NOTE — Telephone Encounter (Signed)
Spoke with patient's wife and confirmed 3 month FU with Dr Erlinda Hong for 02/08/15, 11:00 am. Informed her that he needs to arrive at 10:45 am. She verbalized understanding.

## 2014-11-06 ENCOUNTER — Ambulatory Visit: Payer: BLUE CROSS/BLUE SHIELD | Admitting: Physical Therapy

## 2014-11-06 ENCOUNTER — Encounter: Payer: Self-pay | Admitting: Physical Therapy

## 2014-11-06 VITALS — BP 136/93 | HR 67

## 2014-11-06 DIAGNOSIS — IMO0002 Reserved for concepts with insufficient information to code with codable children: Secondary | ICD-10-CM

## 2014-11-06 DIAGNOSIS — R262 Difficulty in walking, not elsewhere classified: Secondary | ICD-10-CM

## 2014-11-06 DIAGNOSIS — Z7409 Other reduced mobility: Secondary | ICD-10-CM

## 2014-11-06 DIAGNOSIS — I69398 Other sequelae of cerebral infarction: Secondary | ICD-10-CM | POA: Diagnosis not present

## 2014-11-06 NOTE — Therapy (Signed)
[facility] [address] [place], [place], [address] Phone: [phone]   Fax:  [phone]  Physical Therapy Treatment  Patient Details  Name: Samuel Shaw MRN: [id number] Date of Birth: March 23, 1961 Referring Provider:  Iona Beard, MD  Encounter Date: 11/06/2014    11/06/14 1320  PT Visits / Re-Eval  Visit Number 20  Date for PT Re-Evaluation 11/27/14  Authorization  Authorization Type BCBS 90 visit limit combined  PT Time Calculation  PT Start Time 1318  PT Stop Time 1400  PT Time Calculation (min) 42 min  PT - End of Session  Equipment Utilized During Treatment Gait belt     Past Medical History  Diagnosis Date  . Stroke   . Hyperlipidemia     History reviewed. No pertinent past surgical history.  Filed Vitals:   11/06/14 1320  BP: 136/93  Pulse: 67   Lack of coordination due to stroke   438.89 ... I69.80 ... Change Dx 2.  Difficulty walking   719.7 R26.2 Change Dx 3.  Impaired functional mobility and activity tolerance         Subjective Assessment - 11/06/14 1320    Subjective No new complaints. No pain or falls to report.   Currently in Pain? No/denies   Pain Score 0-No pain          [facility] Adult PT Treatment/Exercise - 11/06/14 1323    Transfers   Transfers Sit to Stand;Stand to Sit   Sit to Stand 5: Supervision;Without upper extremity assist   Stand to Sit 5: Supervision;With upper extremity assist;Without upper extremity assist;To bed   Ambulation/Gait   Ambulation/Gait Yes   Ambulation/Gait Assistance 4: Min guard   Ambulation/Gait Assistance Details cues for posture and for increased left hip/knee flexion with swing phase and occasion cue for occasion foot scuffing, most notable on inclines/declines   Ambulation Distance (Feet) 220 Feet  x1 indoor, 500 feet outdoors   Assistive device None   Gait Pattern Step-through pattern;Decreased stance time - left;Decreased step  length - right;Decreased hip/knee flexion - left   Ambulation Surface Level;Indoor   Stairs Yes   Stairs Assistance 5: Supervision   Stairs Assistance Details (indicate cue type and reason) no cues needed, increased time needed   Stair Management Technique One rail Right;Alternating pattern;Forwards   Number of Stairs 4  x 3 reps     Neuro Re-ed: Along 40 foot pathway Forward walking lunges x 4 laps with min guard assist  In squat position, lateral stepping forward "electric slide walk", x 4 laps        PT Short Term Goals - 10/28/14 1537    PT SHORT TERM GOAL #1   Title Demonstrate correct performance of home exercise program to address balance, gait and strength impairments. Target: 09/04/14   Status Achieved   PT SHORT TERM GOAL #2   Title Increase Berg balance test score to 40/56 for improving balance.  Target: 09/04/14   Baseline met 09/01/14- 48/56   Status Achieved   PT SHORT TERM GOAL #3   Title Decrease TUG time to 35 seconds for improving efficiency with functional mobility with LRAD.  Target: 09/04/14   Baseline met 09/01/14- 18.0 sec's   Status Achieved   PT SHORT TERM GOAL #4   Title Begin gait training with either a single point cane or without an assistive device (with AFO) as appropriate.  Target: 09/04/14   Baseline met by 09/01/14   Status Achieved   PT  SHORT TERM GOAL #5   Title Demonstrate ability to stand x5 minutes unsupported with upper extremity task for increased activity tolerance.  Target: 09/04/14   Baseline met 09/01/14   Status Achieved   PT SHORT TERM GOAL #6   Title Pt will demonstate ability to ascend/descend stairs with bilteral hand rail and reciprocal pattern to demonstrate increaed funcitonal strength of LLE and improved efficiency of home access. Target 10/30/14   Status Achieved   PT SHORT TERM GOAL #7   Title Pt will demonstrate ability to step "in/out" of a simulated bathtub 3/3x with UE support for progress toward independent shower/tub transfers.  Target 10/30/14   Status Achieved   PT SHORT TERM GOAL #8   Title Pt will ambulate on even indoor surfaces without AFO and without assistive device, independently. Target 10/30/14   Status Achieved           PT Long Term Goals - 11/06/14 1322    PT LONG TERM GOAL #1   Title Verbalize understanding of signs and risk factors of CVA.  Target: 10/03/14   PT LONG TERM GOAL #2   Title Increase Berg Balance Test score to 47/56 for decreased fall risk.  Target: 10/03/14   Baseline met 09/01/14- 48/56   Status Achieved   PT LONG TERM GOAL #3   Title Ambulate 500' on indoor level surfaces without assistive device independently for increased independence with household ambulation. Target 11/27/14   Baseline Extended to end of renewal plan of care.   Status On-going   PT LONG TERM GOAL #4   Title Increase gait speed without assistive device to >1.8 ft/sec for decreased fall risk. Target: 11/27/14   Baseline extended to end of renewal plan of care   Status On-going   PT LONG TERM GOAL #5   Title Perform TUG in <30 seconds without assistive device with safe turn, independently, for increased safety with household mobility. Target: 10/03/14   PT LONG TERM GOAL #6   Title Increase FOTO stroke impact mobility scale to 70% for decreased disability due to CVA. Target: 11/27/14    Baseline extended to end of renewal plan of care   Status On-going   PT LONG TERM GOAL #7   Title Ambulate on outdoor, uneven concrete surfaces without assistive device or AFO independently (supervision may be provided only for cognitive reasons, not physical reasons)  Target 11/27/14   PT LONG TERM GOAL #8   Title Negotiate 4 stairs with single hand rail and reciprocal pattern.  Target 11/27/14   PT LONG TERM GOAL  #9   TITLE Demonstrate ability to step in/out of simulated tub without UE support, independently for increased independence with bathing.  Target 11/27/14   Baseline met on 11/06/14   Status Achieved           Plan -  11/09/14 0059    Clinical Impression Statement Pt making steady progress with most goals met.    Rehab Potential Good   Clinical Impairments Affecting Rehab Potential decreased awareness of deficits   PT Frequency 2x / week   PT Treatment/Interventions ADLs/Self Care Home Management;Therapeutic activities;Patient/family education;Therapeutic exercise;DME Instruction;Gait training;Balance training;Neuromuscular re-education;Functional mobility training;Stair training   PT Next Visit Plan continue toward LTGs through end of plan of  care   Consulted and Agree with Plan of Care Patient;Family member/caregiver   Family Member Consulted spouse      Problem List Patient Active Problem List   Diagnosis Date Noted  . Carotid artery occlusion with   infarction 10/08/2014  . Essential hypertension 10/08/2014  . Hemi-neglect of left side 07/16/2014  . Spastic hemiplegia affecting nondominant side 07/10/2014  . Cognitive deficit, post-stroke 07/10/2014  . Sinus bradycardia on ECG 07/10/2014  . 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 CVA (cerebral vascular accident)   . Tobacco abuse   . HLD (hyperlipidemia)   . CVA (cerebral infarction) 07/06/2014    Willow Ora 11/06/2014, 1:42 PM  Willow Ora, PTA, [facility] [address] [place], [place] [address] [phone] 11/06/2014, 1:42 PM

## 2014-11-10 ENCOUNTER — Telehealth: Payer: Self-pay | Admitting: Neurology

## 2014-11-10 ENCOUNTER — Encounter: Payer: Self-pay | Admitting: Physical Therapy

## 2014-11-10 ENCOUNTER — Ambulatory Visit: Payer: BLUE CROSS/BLUE SHIELD | Admitting: Physical Therapy

## 2014-11-10 ENCOUNTER — Telehealth: Payer: Self-pay

## 2014-11-10 VITALS — BP 139/85 | HR 56

## 2014-11-10 DIAGNOSIS — R262 Difficulty in walking, not elsewhere classified: Secondary | ICD-10-CM

## 2014-11-10 DIAGNOSIS — I69398 Other sequelae of cerebral infarction: Secondary | ICD-10-CM | POA: Diagnosis not present

## 2014-11-10 DIAGNOSIS — Z7409 Other reduced mobility: Secondary | ICD-10-CM

## 2014-11-10 DIAGNOSIS — IMO0002 Reserved for concepts with insufficient information to code with codable children: Secondary | ICD-10-CM

## 2014-11-10 NOTE — Telephone Encounter (Signed)
VM left to answer questions concerning blood pressure.  Informed Bobbie to call office back.  Thanks

## 2014-11-10 NOTE — Telephone Encounter (Signed)
[name] is calling back about her husband [employer].  Please call.

## 2014-11-10 NOTE — Telephone Encounter (Signed)
[name], pt's wife called wanting to speak with a nurse regarding her concerns on pt's low blood pressure. She states that the patient blood pressure was 106/70 today. She wants to know at what point should she be worried. Please call and advise. [name] can be reached @ [phone]

## 2014-11-10 NOTE — Therapy (Signed)
[facility] [address] [place], [place], [address] Phone: [phone]   Fax:  [phone]  Physical Therapy Treatment  Patient Details  Name: Samuel Shaw MRN: [id number] Date of Birth: 07-20-1960 Referring Provider:  Iona Beard, MD  Encounter Date: 11/10/2014      PT End of Session - 11/10/14 1123    Visit Number 21   Date for PT Re-Evaluation 11/27/14   Authorization Type BCBS 90 visit limit combined   PT Start Time 1120   PT Stop Time 1203   PT Time Calculation (min) 43 min   Equipment Utilized During Treatment Gait belt   Activity Tolerance Patient tolerated treatment well   Behavior During Therapy [facility] for tasks assessed/performed      Past Medical History  Diagnosis Date  . Stroke   . Hyperlipidemia     History reviewed. No pertinent past surgical history.  Filed Vitals:   11/10/14 1123 11/10/14 1139  BP: 115/82 139/85  Pulse: 52 56    Visit Diagnosis:  Lack of coordination due to stroke  Difficulty walking  Impaired functional mobility and activity tolerance      Subjective Assessment - 11/10/14 1123    Subjective No new complaints. No pain or falls to report.   Currently in Pain? No/denies   Pain Score 0-No pain           [facility] Adult PT Treatment/Exercise - 11/10/14 1127    Transfers   Sit to Stand 5: Supervision;Without upper extremity assist   Stand to Sit 5: Supervision;With upper extremity assist;Without upper extremity assist;To bed   Ambulation/Gait   Ambulation/Gait Yes   Ambulation/Gait Assistance 4: Min guard;5: Supervision   Ambulation/Gait Assistance Details occasional cues on posture and foot clearance with gait             Ambulation Distance (Feet) 600 Feet   Assistive device None   Gait Pattern Step-through pattern;Decreased stance time - left;Decreased step length - right;Decreased hip/knee flexion - left   Ambulation Surface  Level;Unlevel;Indoor;Outdoor;Paved;Gravel;Grass   Knee/Hip Exercises: Aerobic   Elliptical level 1.0 x 2 minutes forward with bil UE support   Knee/Hip Exercises: Machines for Strengthening   Cybex Leg Press bil legs: 85# 5 sec hold x 10 reps; left leg only: 45# 5 sec hold x 10 reps.                              Neuro Re-ed: Along a 40 foot pathway - walking forward lunges x 2 laps - "electric slide" (squat diagonal stepping) x 2 laps - in squat position, side stepping left/right x 1 lap each way.  On mat  Quadruped:  - left leg hip/knee flexion (knee to elbow) and then out into full extension x 10 reps with assist for full, smooth movements.        PT Short Term Goals - 10/28/14 1537    PT SHORT TERM GOAL #1   Title Demonstrate correct performance of home exercise program to address balance, gait and strength impairments. Target: 09/04/14   Status Achieved   PT SHORT TERM GOAL #2   Title Increase Berg balance test score to 40/56 for improving balance.  Target: 09/04/14   Baseline met 09/01/14- 48/56   Status Achieved   PT SHORT TERM GOAL #3   Title Decrease TUG time to 35 seconds for improving efficiency with functional mobility with LRAD.  Target: 09/04/14  Baseline met 09/01/14- 18.0 sec's   Status Achieved   PT SHORT TERM GOAL #4   Title Begin gait training with either a single point cane or without an assistive device (with AFO) as appropriate.  Target: 09/04/14   Baseline met by 09/01/14   Status Achieved   PT SHORT TERM GOAL #5   Title Demonstrate ability to stand x5 minutes unsupported with upper extremity task for increased activity tolerance.  Target: 09/04/14   Baseline met 09/01/14   Status Achieved   PT SHORT TERM GOAL #6   Title Pt will demonstate ability to ascend/descend stairs with bilteral hand rail and reciprocal pattern to demonstrate increaed funcitonal strength of LLE and improved efficiency of home access. Target 10/30/14   Status Achieved   PT SHORT TERM GOAL #7    Title Pt will demonstrate ability to step "in/out" of a simulated bathtub 3/3x with UE support for progress toward independent shower/tub transfers. Target 10/30/14   Status Achieved   PT SHORT TERM GOAL #8   Title Pt will ambulate on even indoor surfaces without AFO and without assistive device, independently. Target 10/30/14   Status Achieved           PT Long Term Goals - 11/06/14 1322    PT LONG TERM GOAL #1   Title Verbalize understanding of signs and risk factors of CVA.  Target: 10/03/14   PT LONG TERM GOAL #2   Title Increase Berg Balance Test score to 47/56 for decreased fall risk.  Target: 10/03/14   Baseline met 09/01/14- 48/56   Status Achieved   PT LONG TERM GOAL #3   Title Ambulate 500' on indoor level surfaces without assistive device independently for increased independence with household ambulation. Target 11/27/14   Baseline Extended to end of renewal plan of care.   Status On-going   PT LONG TERM GOAL #4   Title Increase gait speed without assistive device to >1.8 ft/sec for decreased fall risk. Target: 11/27/14   Baseline extended to end of renewal plan of care   Status On-going   PT LONG TERM GOAL #5   Title Perform TUG in <30 seconds without assistive device with safe turn, independently, for increased safety with household mobility. Target: 10/03/14   PT LONG TERM GOAL #6   Title Increase FOTO stroke impact mobility scale to 70% for decreased disability due to CVA. Target: 11/27/14    Baseline extended to end of renewal plan of care   Status On-going   PT LONG TERM GOAL #7   Title Ambulate on outdoor, uneven concrete surfaces without assistive device or AFO independently (supervision may be provided only for cognitive reasons, not physical reasons)  Target 11/27/14   PT LONG TERM GOAL #8   Title Negotiate 4 stairs with single hand rail and reciprocal pattern.  Target 11/27/14   PT LONG TERM GOAL  #9   TITLE Demonstrate ability to step in/out of simulated tub without UE  support, independently for increased independence with bathing.  Target 11/27/14   Baseline met on 11/06/14   Status Achieved             Plan - 11/10/14 1259    Clinical Impression Statement Pt making steady progress toward goals. Left leg continues to fatigue with higher level balance and strengthening activities.   Rehab Potential Good   Clinical Impairments Affecting Rehab Potential decreased awareness of deficits   PT Frequency 2x / week   PT Treatment/Interventions ADLs/Self Care Home Management;Therapeutic activities;Patient/family  education;Therapeutic exercise;DME Instruction;Gait training;Balance training;Neuromuscular re-education;Functional mobility training;Stair training   PT Next Visit Plan continue toward LTGs through end of plan of  care   Consulted and Agree with Plan of Care Patient;Family member/caregiver   Family Member Consulted spouse        Problem List Patient Active Problem List   Diagnosis Date Noted  . Carotid artery occlusion with infarction 10/08/2014  . Essential hypertension 10/08/2014  . Hemi-neglect of left side 07/16/2014  . Spastic hemiplegia affecting nondominant side 07/10/2014  . Cognitive deficit, post-stroke 07/10/2014  . Sinus bradycardia on ECG 07/10/2014  . 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 CVA (cerebral vascular accident)   . Tobacco abuse   . HLD (hyperlipidemia)   . CVA (cerebral infarction) 07/06/2014    Willow Ora 11/10/2014, 1:00 PM  Willow Ora, PTA, [facility] [address] [place], [place] [address] [phone] 11/10/2014, 1:00 PM

## 2014-11-11 ENCOUNTER — Ambulatory Visit: Payer: BLUE CROSS/BLUE SHIELD | Admitting: Physical Therapy

## 2014-11-11 ENCOUNTER — Encounter: Payer: Self-pay | Admitting: Physical Therapy

## 2014-11-11 DIAGNOSIS — R262 Difficulty in walking, not elsewhere classified: Secondary | ICD-10-CM

## 2014-11-11 DIAGNOSIS — IMO0002 Reserved for concepts with insufficient information to code with codable children: Secondary | ICD-10-CM

## 2014-11-11 DIAGNOSIS — Z7409 Other reduced mobility: Secondary | ICD-10-CM

## 2014-11-11 DIAGNOSIS — I69398 Other sequelae of cerebral infarction: Secondary | ICD-10-CM | POA: Diagnosis not present

## 2014-11-11 NOTE — Telephone Encounter (Signed)
Patient's wife is returning a call regarding the patient. °

## 2014-11-11 NOTE — Telephone Encounter (Signed)
Thank you, Stanton Kidney. I also called pt and his wife and tell them to stop lisinopril and drink more fluid. Check BP daily. Ideally we would like BP at 120s. But he is at 100-110 and no symptoms so he is OK now but would like to d/c BP meds and more fluid intake and see how his BP goes. Pt and wife expressed understanding and appreciation.  Rosalin Hawking, MD PhD Stroke Neurology 11/11/2014 4:43 PM

## 2014-11-11 NOTE — Therapy (Signed)
[facility] [address] [place], [place], [address] Phone: [phone]   Fax:  [phone]  Physical Therapy Treatment  Patient Details  Name: Samuel Shaw MRN: [id number] Date of Birth: 1960/09/28 Referring Provider:  Iona Beard, MD  Encounter Date: 11/11/2014      PT End of Session - 11/11/14 1108    Visit Number 22   Date for PT Re-Evaluation 11/27/14   Authorization Type BCBS 90 visit limit combined   PT Start Time 1104   PT Stop Time 1145   PT Time Calculation (min) 41 min   Equipment Utilized During Treatment Gait belt   Activity Tolerance Patient tolerated treatment well   Behavior During Therapy [facility] for tasks assessed/performed      Past Medical History  Diagnosis Date  . Stroke   . Hyperlipidemia     History reviewed. No pertinent past surgical history.  There were no vitals filed for this visit.  Visit Diagnosis:  Difficulty walking  Impaired functional mobility and activity tolerance  Lack of coordination due to stroke      Subjective Assessment - 11/11/14 1108    Subjective No new complaints. No pain or falls to report.   Currently in Pain? No/denies   Pain Score 0-No pain         [facility] Adult PT Treatment/Exercise - 11/11/14 1109    Transfers   Sit to Stand 5: Supervision;Without upper extremity assist   Stand to Sit 5: Supervision;With upper extremity assist;Without upper extremity assist;To bed   Ambulation/Gait   Ambulation/Gait Yes   Ambulation/Gait Assistance 4: Min guard;5: Supervision   Ambulation/Gait Assistance Details occasional foot scuffing heard/noted,    Ambulation Distance (Feet) 1000 Feet  or more   Assistive device None   Gait Pattern Step-through pattern;Decreased stance time - left;Decreased step length - right;Decreased hip/knee flexion - left   Ambulation Surface Level;Unlevel;Indoor;Outdoor;Paved;Gravel;Grass   Knee/Hip Exercises: Aerobic   Elliptical level 1.0 x 2 minutes forward with bil UE support   Knee/Hip Exercises: Machines for Strengthening   Cybex Leg Press bil legs: 85# 5 sec hold x 10 reps; left leg only: 45# 5 sec hold x 10 reps, performed 2 sets each.                                  PT Short Term Goals - 10/28/14 1537    PT SHORT TERM GOAL #1   Title Demonstrate correct performance of home exercise program to address balance, gait and strength impairments. Target: 09/04/14   Status Achieved   PT SHORT TERM GOAL #2   Title Increase Berg balance test score to 40/56 for improving balance.  Target: 09/04/14   Baseline met 09/01/14- 48/56   Status Achieved   PT SHORT TERM GOAL #3   Title Decrease TUG time to 35 seconds for improving efficiency with functional mobility with LRAD.  Target: 09/04/14   Baseline met 09/01/14- 18.0 sec's   Status Achieved   PT SHORT TERM GOAL #4   Title Begin gait training with either a single point cane or without an assistive device (with AFO) as appropriate.  Target: 09/04/14   Baseline met by 09/01/14   Status Achieved   PT SHORT TERM GOAL #5   Title Demonstrate ability to stand x5 minutes unsupported with upper extremity task for increased activity tolerance.  Target: 09/04/14   Baseline met 09/01/14   Status Achieved  PT SHORT TERM GOAL #6   Title Pt will demonstate ability to ascend/descend stairs with bilteral hand rail and reciprocal pattern to demonstrate increaed funcitonal strength of LLE and improved efficiency of home access. Target 10/30/14   Status Achieved   PT SHORT TERM GOAL #7   Title Pt will demonstrate ability to step "in/out" of a simulated bathtub 3/3x with UE support for progress toward independent shower/tub transfers. Target 10/30/14   Status Achieved   PT SHORT TERM GOAL #8   Title Pt will ambulate on even indoor surfaces without AFO and without assistive device, independently. Target 10/30/14   Status Achieved           PT Long Term Goals - 11/06/14 1322    PT  LONG TERM GOAL #1   Title Verbalize understanding of signs and risk factors of CVA.  Target: 10/03/14   PT LONG TERM GOAL #2   Title Increase Berg Balance Test score to 47/56 for decreased fall risk.  Target: 10/03/14   Baseline met 09/01/14- 48/56   Status Achieved   PT LONG TERM GOAL #3   Title Ambulate 500' on indoor level surfaces without assistive device independently for increased independence with household ambulation. Target 11/27/14   Baseline Extended to end of renewal plan of care.   Status On-going   PT LONG TERM GOAL #4   Title Increase gait speed without assistive device to >1.8 ft/sec for decreased fall risk. Target: 11/27/14   Baseline extended to end of renewal plan of care   Status On-going   PT LONG TERM GOAL #5   Title Perform TUG in <30 seconds without assistive device with safe turn, independently, for increased safety with household mobility. Target: 10/03/14   PT LONG TERM GOAL #6   Title Increase FOTO stroke impact mobility scale to 70% for decreased disability due to CVA. Target: 11/27/14    Baseline extended to end of renewal plan of care   Status On-going   PT LONG TERM GOAL #7   Title Ambulate on outdoor, uneven concrete surfaces without assistive device or AFO independently (supervision may be provided only for cognitive reasons, not physical reasons)  Target 11/27/14   PT LONG TERM GOAL #8   Title Negotiate 4 stairs with single hand rail and reciprocal pattern.  Target 11/27/14   PT LONG TERM GOAL  #9   TITLE Demonstrate ability to step in/out of simulated tub without UE support, independently for increased independence with bathing.  Target 11/27/14   Baseline met on 11/06/14   Status Achieved           Plan - 11/11/14 1109    Clinical Impression Statement Pt making great progress toward goals. No loss of balance or unsteadiness with gait noted today.   Rehab Potential Good   Clinical Impairments Affecting Rehab Potential decreased awareness of deficits   PT  Frequency 2x / week   PT Treatment/Interventions ADLs/Self Care Home Management;Therapeutic activities;Patient/family education;Therapeutic exercise;DME Instruction;Gait training;Balance training;Neuromuscular re-education;Functional mobility training;Stair training   PT Next Visit Plan continue toward LTGs through end of plan of  care   Consulted and Agree with Plan of Care Patient;Family member/caregiver   Family Member Consulted spouse      Problem List Patient Active Problem List   Diagnosis Date Noted  . Carotid artery occlusion with infarction 10/08/2014  . Essential hypertension 10/08/2014  . Hemi-neglect of left side 07/16/2014  . Spastic hemiplegia affecting nondominant side 07/10/2014  . Cognitive deficit, post-stroke 07/10/2014  .  Sinus bradycardia on ECG 07/10/2014  . 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 CVA (cerebral vascular accident)   . Tobacco abuse   . HLD (hyperlipidemia)   . CVA (cerebral infarction) 07/06/2014    Willow Ora 11/11/2014, 8:20 PM  Willow Ora, PTA, [facility] [address], [place], [place] [address] [phone] 11/11/2014, 8:20 PM

## 2014-11-11 NOTE — Telephone Encounter (Signed)
Spoke with wife re: patient's BP low. She states it was 100/65 last night; she checked while on phone, and his BP was 110/65. Informed her that both those readings are within normal limits. She inquired as to what BP reading should she be concerned. Advised her that BP consistently 90/60 or below. She states she will continue to monitor daily. Also advised her to be sure he is well hydrated; she states he drinks sodas all day, will not drink much water. Informed her that sodas with caffeine will not keep him hydrated and to encourage less sodas and more water. She verbalized understanding, appreciation for this call.

## 2014-11-17 ENCOUNTER — Ambulatory Visit: Payer: BLUE CROSS/BLUE SHIELD | Admitting: Physical Therapy

## 2014-11-17 ENCOUNTER — Encounter: Payer: Self-pay | Admitting: Physical Therapy

## 2014-11-17 VITALS — BP 126/94 | HR 75

## 2014-11-17 DIAGNOSIS — R262 Difficulty in walking, not elsewhere classified: Secondary | ICD-10-CM

## 2014-11-17 DIAGNOSIS — I69398 Other sequelae of cerebral infarction: Secondary | ICD-10-CM | POA: Diagnosis not present

## 2014-11-17 DIAGNOSIS — Z7409 Other reduced mobility: Secondary | ICD-10-CM

## 2014-11-17 NOTE — Therapy (Signed)
[facility] [address] [place], [place], [address] Phone: [phone]   Fax:  [phone]  Physical Therapy Treatment  Patient Details  Name: Samuel Shaw MRN: [id number] Date of Birth: 18-Aug-1960 Referring Provider:  Iona Beard, MD  Encounter Date: 11/17/2014      PT End of Session - 11/17/14 1236    Visit Number 23   Date for PT Re-Evaluation 11/27/14   Authorization Type BCBS 90 visit limit combined   PT Start Time 1233   PT Stop Time 1315   PT Time Calculation (min) 42 min   Equipment Utilized During Treatment Gait belt   Activity Tolerance Patient tolerated treatment well   Behavior During Therapy [facility] for tasks assessed/performed      Past Medical History  Diagnosis Date  . Stroke   . Hyperlipidemia     History reviewed. No pertinent past surgical history.  Filed Vitals:   11/17/14 1235 11/17/14 1302 11/17/14 1315  BP: 116/88 128/93 126/94  Pulse: 73 73 75    Visit Diagnosis:  Difficulty walking  Impaired functional mobility and activity tolerance      Subjective Assessment - 11/17/14 1235    Subjective No new complaints. No pain or falls to report. Reports MD stopped his BP medication last week (Dr. Erlinda Hong).    Currently in Pain? No/denies   Pain Score 0-No pain           [facility] Adult PT Treatment/Exercise - 11/17/14 1243    Transfers   Sit to Stand 5: Supervision;Without upper extremity assist   Stand to Sit 5: Supervision;With upper extremity assist;Without upper extremity assist;To bed   Ambulation/Gait   Ambulation/Gait Yes   Ambulation/Gait Assistance 5: Supervision   Ambulation/Gait Assistance Details occasional cues on posture and to relax left UE with gait   Ambulation Distance (Feet) 900 Feet   Assistive device None   Gait Pattern Step-through pattern;Decreased stance time - left;Decreased step length - right;Decreased hip/knee flexion - left   Ambulation Surface  Level;Indoor   Knee/Hip Exercises: Aerobic   Elliptical level 1.0 x 2 minutes forward with bil UE support   Knee/Hip Exercises: Machines for Strengthening   Cybex Leg Press bil legs: 85# 5 sec hold x 10 reps; left leg only: 45# 5 sec hold x 10 reps, performed 2 sets each.                            At stairs: foot tap up/down to 3 stairs x 10 reps. Cues on hip/knee flexion and to not "slide" his foot off the steps.         PT Short Term Goals - 10/28/14 1537    PT SHORT TERM GOAL #1   Title Demonstrate correct performance of home exercise program to address balance, gait and strength impairments. Target: 09/04/14   Status Achieved   PT SHORT TERM GOAL #2   Title Increase Berg balance test score to 40/56 for improving balance.  Target: 09/04/14   Baseline met 09/01/14- 48/56   Status Achieved   PT SHORT TERM GOAL #3   Title Decrease TUG time to 35 seconds for improving efficiency with functional mobility with LRAD.  Target: 09/04/14   Baseline met 09/01/14- 18.0 sec's   Status Achieved   PT SHORT TERM GOAL #4   Title Begin gait training with either a single point cane or without an assistive device (with AFO) as appropriate.  Target:  09/04/14   Baseline met by 09/01/14   Status Achieved   PT SHORT TERM GOAL #5   Title Demonstrate ability to stand x5 minutes unsupported with upper extremity task for increased activity tolerance.  Target: 09/04/14   Baseline met 09/01/14   Status Achieved   PT SHORT TERM GOAL #6   Title Pt will demonstate ability to ascend/descend stairs with bilteral hand rail and reciprocal pattern to demonstrate increaed funcitonal strength of LLE and improved efficiency of home access. Target 10/30/14   Status Achieved   PT SHORT TERM GOAL #7   Title Pt will demonstrate ability to step "in/out" of a simulated bathtub 3/3x with UE support for progress toward independent shower/tub transfers. Target 10/30/14   Status Achieved   PT SHORT TERM GOAL #8   Title Pt will ambulate on  even indoor surfaces without AFO and without assistive device, independently. Target 10/30/14   Status Achieved           PT Long Term Goals - 11/06/14 1322    PT LONG TERM GOAL #1   Title Verbalize understanding of signs and risk factors of CVA.  Target: 10/03/14   PT LONG TERM GOAL #2   Title Increase Berg Balance Test score to 47/56 for decreased fall risk.  Target: 10/03/14   Baseline met 09/01/14- 48/56   Status Achieved   PT LONG TERM GOAL #3   Title Ambulate 500' on indoor level surfaces without assistive device independently for increased independence with household ambulation. Target 11/27/14   Baseline Extended to end of renewal plan of care.   Status On-going   PT LONG TERM GOAL #4   Title Increase gait speed without assistive device to >1.8 ft/sec for decreased fall risk. Target: 11/27/14   Baseline extended to end of renewal plan of care   Status On-going   PT LONG TERM GOAL #5   Title Perform TUG in <30 seconds without assistive device with safe turn, independently, for increased safety with household mobility. Target: 10/03/14   PT LONG TERM GOAL #6   Title Increase FOTO stroke impact mobility scale to 70% for decreased disability due to CVA. Target: 11/27/14    Baseline extended to end of renewal plan of care   Status On-going   PT LONG TERM GOAL #7   Title Ambulate on outdoor, uneven concrete surfaces without assistive device or AFO independently (supervision may be provided only for cognitive reasons, not physical reasons)  Target 11/27/14   PT LONG TERM GOAL #8   Title Negotiate 4 stairs with single hand rail and reciprocal pattern.  Target 11/27/14   PT LONG TERM GOAL  #9   TITLE Demonstrate ability to step in/out of simulated tub without UE support, independently for increased independence with bathing.  Target 11/27/14   Baseline met on 11/06/14   Status Achieved           Plan - 11/17/14 1236    Clinical Impression Statement Pt continues to make progress toward  goals. Pt is on track to meet goals by end of plan of care.   Rehab Potential Good   Clinical Impairments Affecting Rehab Potential decreased awareness of deficits   PT Frequency 2x / week   PT Treatment/Interventions ADLs/Self Care Home Management;Therapeutic activities;Patient/family education;Therapeutic exercise;DME Instruction;Gait training;Balance training;Neuromuscular re-education;Functional mobility training;Stair training   PT Next Visit Plan continue toward LTGs through end of plan of  care   Consulted and Agree with Plan of Care Patient;Family member/caregiver   Family  Member Consulted spouse        Problem List Patient Active Problem List   Diagnosis Date Noted  . Carotid artery occlusion with infarction 10/08/2014  . Essential hypertension 10/08/2014  . Hemi-neglect of left side 07/16/2014  . Spastic hemiplegia affecting nondominant side 07/10/2014  . Cognitive deficit, post-stroke 07/10/2014  . Sinus bradycardia on ECG 07/10/2014  . 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 CVA (cerebral vascular accident)   . Tobacco abuse   . HLD (hyperlipidemia)   . CVA (cerebral infarction) 07/06/2014    Willow Ora 11/18/2014, 8:03 PM  Willow Ora, PTA, [facility] [address], [place], [place] [address] [phone] 11/18/2014, 8:03 PM

## 2014-11-20 ENCOUNTER — Ambulatory Visit: Payer: BLUE CROSS/BLUE SHIELD

## 2014-11-20 VITALS — BP 130/98 | HR 60

## 2014-11-20 DIAGNOSIS — G8194 Hemiplegia, unspecified affecting left nondominant side: Secondary | ICD-10-CM

## 2014-11-20 DIAGNOSIS — I69398 Other sequelae of cerebral infarction: Secondary | ICD-10-CM | POA: Diagnosis not present

## 2014-11-20 NOTE — Therapy (Signed)
[facility] [address] [place], [place], [address] Phone: [phone]   Fax:  [phone]  Patient Details  Name: Samuel Shaw MRN: [id number] Date of Birth: 03/28/1961 Referring Provider:  Iona Beard, MD  Encounter Date: 11/20/2014   Therapist completed intake--ie reviewed meds, subjective report, and checked BP with hypertension noted.  Today during therapy (at rest, before starting exercise) blood pressure was 128/24mmHg; HR was 55. Pt then was instructed to continue sitting and resting x~4 minutes and BP was rechecked: and was 133/102 and HR was 60. Checked with manual cuff and it was 130/98 mmHg. Therapist wrote an inbasket message to Dr. Erlinda Hong and Dr. Criss Rosales to express concern about these vital signs. Therapist educated pt on signs/symptoms of CVA and to call 911 or go to the ER if he experiences any of these, and to contact his doctor today regarding this elevated BP. Pt verbalized understanding.   Delrae Sawyers, PT,DPT,NCS 11/20/2014 3:18 PM Phone [phone] FAX [phone]         [facility] [address] [place], [place], [address] Phone: [phone]   Fax:  [phone]

## 2014-11-23 ENCOUNTER — Ambulatory Visit: Payer: BLUE CROSS/BLUE SHIELD

## 2014-11-23 VITALS — BP 131/87

## 2014-11-23 DIAGNOSIS — R262 Difficulty in walking, not elsewhere classified: Secondary | ICD-10-CM

## 2014-11-23 DIAGNOSIS — I69398 Other sequelae of cerebral infarction: Secondary | ICD-10-CM | POA: Diagnosis not present

## 2014-11-23 DIAGNOSIS — IMO0002 Reserved for concepts with insufficient information to code with codable children: Secondary | ICD-10-CM

## 2014-11-23 NOTE — Therapy (Addendum)
[facility] [address] [place], [place], [address] Phone: [phone]   Fax:  [phone]  Physical Therapy Treatment  Patient Details  Name: Samuel Shaw MRN: [id number] Date of Birth: 1960-09-23 Referring Provider:  Iona Beard, MD  Encounter Date: 11/23/2014      PT End of Session - 11/23/14 1622    Visit Number 24   Date for PT Re-Evaluation 11/27/14   Authorization Type BC[employer] 90 visit limit combined   PT Start Time 1540   PT Stop Time 1620   PT Time Calculation (min) 40 min      Past Medical History  Diagnosis Date  . Stroke   . Hyperlipidemia     No past surgical history on file.  Filed Vitals:   11/23/14 1547  BP: 131/87    Visit Diagnosis:  Lack of coordination due to stroke  Difficulty walking      Subjective Assessment - 11/23/14 1545    Subjective Pt went in to get blood work done today and waited for a long time and was unable to be seen due to needing to leave in time for his PT appointment.   Currently in Pain? No/denies     BP WNL today.  Therex:  Reported inability to donn pants without assistance due to inability to lift left leg. Pt attempted to donn sweatpants over his current clothing for therapist to assess possible impairment causing this. Noted to have tight gluts and weak hip flexors.  3x30 seconds seated glut max stretch LLE  Then practiced donning pants in L ankle over knee position with increased range of motion and pt reported increased ease After gait training, performed 2 minute supine LLE hip flexor/quad stretch to improve knee flexion with gait.  Gait training: >1000'without assistive device on outdoor uneven concrete and thick grass with supervision due to occasional decreased L foot clearance with occasional/brief foot scuffing. As pt ambulates foot clearance improves.  Standing with mirror for cue with instructions to step forwrad with RLE and push off  through L toe, then drop (passively flex) L knee prior to step through. Performed repeatedly.                          PT Education - 11/23/14 1621    Education provided Yes   Education Details hip flexor stretch and glut stretch; recommended supervision with ambulation outside.   Person(s) Educated Patient   Methods Handout;Explanation;Demonstration   Comprehension Verbalized understanding;Returned demonstration          PT Short Term Goals - 10/28/14 1537    PT SHORT TERM GOAL #1   Title Demonstrate correct performance of home exercise program to address balance, gait and strength impairments. Target: 09/04/14   Status Achieved   PT SHORT TERM GOAL #2   Title Increase Berg balance test score to 40/56 for improving balance.  Target: 09/04/14   Baseline met 09/01/14- 48/56   Status Achieved   PT SHORT TERM GOAL #3   Title Decrease TUG time to 35 seconds for improving efficiency with functional mobility with LRAD.  Target: 09/04/14   Baseline met 09/01/14- 18.0 sec's   Status Achieved   PT SHORT TERM GOAL #4   Title Begin gait training with either a single point cane or without an assistive device (with AFO) as appropriate.  Target: 09/04/14   Baseline met by 09/01/14   Status Achieved   PT SHORT TERM GOAL #5  Title Demonstrate ability to stand x5 minutes unsupported with upper extremity task for increased activity tolerance.  Target: 09/04/14   Baseline met 09/01/14   Status Achieved   PT SHORT TERM GOAL #6   Title Pt will demonstate ability to ascend/descend stairs with bilteral hand rail and reciprocal pattern to demonstrate increaed funcitonal strength of LLE and improved efficiency of home access. Target 10/30/14   Status Achieved   PT SHORT TERM GOAL #7   Title Pt will demonstrate ability to step "in/out" of a simulated bathtub 3/3x with UE support for progress toward independent shower/tub transfers. Target 10/30/14   Status Achieved   PT SHORT TERM GOAL #8   Title Pt  will ambulate on even indoor surfaces without AFO and without assistive device, independently. Target 10/30/14   Status Achieved           PT Long Term Goals - 11/23/14 1639    PT LONG TERM GOAL #1   Title Verbalize understanding of signs and risk factors of CVA.  Target: 10/03/14   PT LONG TERM GOAL #2   Title Increase Berg Balance Test score to 47/56 for decreased fall risk.  Target: 10/03/14   Baseline met 09/01/14- 48/56   Status Achieved   PT LONG TERM GOAL #3   Title Ambulate 500' on indoor level surfaces without assistive device independently for increased independence with household ambulation. Target 11/27/14   Baseline Extended to end of renewal plan of care.   Status On-going   PT LONG TERM GOAL #4   Title Increase gait speed without assistive device to >1.8 ft/sec for decreased fall risk. Target: 11/27/14   Baseline extended to end of renewal plan of care   Status On-going   PT LONG TERM GOAL #5   Title Perform TUG in <30 seconds without assistive device with safe turn, independently, for increased safety with household mobility. Target: 10/03/14   PT LONG TERM GOAL #6   Title Increase FOTO stroke impact mobility scale to 70% for decreased disability due to CVA. Target: 11/27/14    Baseline extended to end of renewal plan of care   Status On-going   PT LONG TERM GOAL #7   Title Ambulate on outdoor, uneven concrete surfaces without assistive device or AFO independently (supervision may be provided only for cognitive reasons, not physical reasons)  Target 11/27/14   Status Not Met  11/23/14 requires supervision due to occasional decreased L hip/knee flexion with decreased foot clearance. Instructed to have supervision for ambulating outside   PT LONG TERM GOAL #8   Title Negotiate 4 stairs with single hand rail and reciprocal pattern.  Target 11/27/14   PT LONG TERM GOAL  #9   TITLE Demonstrate ability to step in/out of simulated tub without UE support, independently for increased  independence with bathing.  Target 11/27/14   Baseline met on 11/06/14   Status Achieved               Plan - 11/23/14 1623    Clinical Impression Statement Pt demonstrates LLE maintained in static ~15 degrees of knee flexion throughout gait pattern, with slight improvement in knee flexion in preswing after ambulating 900'. After stretching, when verbal cues with therapist mirror demo provided, pt able to improve knee/hip flexion and extension in gait pattern. Due to this gait impairment causing occasoinal scuffing of LLE, pt was recommended to have supervision when ambulating outside.   PT Next Visit Plan Check LTGs and d/c   Consulted and Agree  with Plan of Care Patient        Problem List Patient Active Problem List   Diagnosis Date Noted  . Carotid artery occlusion with infarction 10/08/2014  . Essential hypertension 10/08/2014  . Hemi-neglect of left side 07/16/2014  . Spastic hemiplegia affecting nondominant side 07/10/2014  . Cognitive deficit, post-stroke 07/10/2014  . Sinus bradycardia on ECG 07/10/2014  . 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 CVA (cerebral vascular accident)   . Tobacco abuse   . HLD (hyperlipidemia)   . CVA (cerebral infarction) 07/06/2014   Delrae Sawyers, PT,DPT,NCS 11/23/2014 4:41 PM Phone [phone] FAX [phone]         [facility] [address] [place], [place], [address] Phone: [phone]   Fax:  [phone]

## 2014-11-23 NOTE — Patient Instructions (Signed)
Hip Stretch   Sit on chair (not ball), right foot on floor. Bend left leg and place ankle on knee. Push left knee down and away from body until stretch is achieved in the hip of the crossed leg. Hold 30 seconds. Perform 3x in morning and night.  Copyright  VHI. All rights reserved.    Hip Flexor Stretch   Lying on back near edge of bed, bend right leg, foot flat. Hang left leg over edge, relaxed, thigh resting on bed and knee bent as far back as you can. for 2 minutes. Repeat morning and night. times. Copyright  VHI. All rights reserved.

## 2014-11-25 ENCOUNTER — Ambulatory Visit: Payer: BLUE CROSS/BLUE SHIELD

## 2014-11-25 VITALS — BP 149/95 | HR 52

## 2014-11-25 DIAGNOSIS — IMO0002 Reserved for concepts with insufficient information to code with codable children: Secondary | ICD-10-CM

## 2014-11-25 DIAGNOSIS — I69398 Other sequelae of cerebral infarction: Secondary | ICD-10-CM | POA: Diagnosis not present

## 2014-11-25 DIAGNOSIS — R262 Difficulty in walking, not elsewhere classified: Secondary | ICD-10-CM

## 2014-11-25 NOTE — Patient Instructions (Signed)
Ischemic Stroke Blood carries oxygen to all areas of your body. A stroke happens when your blood does not flow to your brain like normal. If this happens, your brain will not get the oxygen it needs and brain tissue will die. This is an emergency. Problems (symptoms) of a stroke usually happen suddenly. You may notice them when you wake up. They can include:  Loss of feeling or weakness on one side of the body (face, arm, leg).  Feeling confused.  Trouble talking or understanding.  Trouble seeing.  Trouble walking.  Feeling dizzy.  Loss of balance or coordination.  Severe headache without a cause.  Trouble reading or writing. Get help as soon as any of these problems first start. This is important.  RISK FACTORS  Risk factors are things that make it more likely for you to have a stroke. These things include:  High blood pressure (hypertension).  High cholesterol.  Diabetes.  Heart disease.  Having a buildup of fatty deposits in the blood vessels.  Having an abnormal heart rhythm (atrial fibrillation).  Being very overweight (obese).  Smoking.  Taking birth control pills, especially if you smoke.  **Not being active.  **Having a diet high in fats, salt, and calories.  Drinking too much alcohol.  Using illegal drugs.  Being African American.  Being over the age of 55.  Having a family history of stroke.  Having a history of blood clots, stroke, warning stroke (transient ischemic attack, TIA), or heart attack.  Sickle cell disease. HOME CARE  Take all medicines exactly as told by your doctor. Understand all your medicine instructions.  You may need to take a medicine to thin your blood, like aspirin or warfarin. Take warfarin exactly as told.  Taking too much or too little warfarin is dangerous. Get regular blood tests as told, including the PT and INR tests. The test results help your doctor adjust your dose of warfarin. Your PT and INR levels must be  done as often as told by your doctor.  Food can cause problems with warfarin and affect the results of your blood tests. This is true for foods high in vitamin K, such as spinach, kale, broccoli, cabbage, collard and turnip greens, Brussels sprouts, peas, cauliflower, seaweed, and parsley, as well as beef and pork liver, green tea, and soybean oil. Eat the same amount of food high in vitamin K. Avoid major changes in your diet. Tell your doctor before changing your diet. Talk to a food specialist (dietitian) if you have questions.  Many medicines can cause problems with warfarin and affect your PT and INR test results. Tell your doctor about all medicines you take. This includes vitamins and dietary pills (supplements). Be careful with aspirin and medicines that relieve redness, soreness, and puffiness (inflammation). Do not take or stop medicines unless your doctor tells you to.  Warfarin can cause a lot of bruising or bleeding. Hold pressure over cuts for longer than normal. Talk to your doctor about other side effects of warfarin.  Avoid sports or activities that may cause injury or bleeding.  Be careful when you shave, floss your teeth, or use sharp objects.  Avoid alcoholic drinks or drink very little alcohol while taking warfarin. Tell your doctor if you change how much alcohol you drink.  Tell your dentist and other doctors that you take warfarin before procedures.  If you are able to swallow, eat healthy foods. Eat 5 or more servings of fruits and vegetables a day. Eat soft   foods, pureed foods, or eat small bites of food so you do not choke.  Follow your diet program as told, if you are given one.  Keep a healthy weight.  Stay active. Try to get at least 30 minutes of activity on most or all days.  Do not smoke.  Limit how much alcohol you drink even if you are not taking warfarin. Moderate alcohol use is:  No more than 2 drinks each day for men.  No more than 1 drink each day  for women who are not pregnant.  Keep your home safe so you do not fall. Try:  Putting grab bars in the bedroom and bathroom.  Raising toilet seats.  Putting a seat in the shower.  Go to therapy sessions (physical, occupational, and speech) as told by your doctor.  Use a walker or cane at all times if told to do so.  Keep all doctor visits as told. GET HELP IF:  Your personality changes.  You have trouble swallowing.  You are seeing two of everything.  You are dizzy.  You have a fever.  Your skin starts to break down. GET HELP RIGHT AWAY IF:  The symptoms below may be a sign of an emergency. Do not wait to see if the symptoms go away. Call for help (911 in [place] Do not drive yourself to the hospital.  You have sudden weakness or numbness on the face, arm, or leg (especially on one side of the body).  You have sudden trouble walking or moving your arms or legs.  You have sudden confusion.  You have trouble talking or understanding.  You have sudden trouble seeing in one or both eyes.  You lose your balance or your movements are not smooth.  You have a sudden, severe headache with no known cause.  You have new chest pain or you feel your heart beating in an unsteady way.  You are partly or totally unaware of what is going on around you. Document Released: 06/08/2011 Document Revised: 11/03/2013 Document Reviewed: 01/28/2012 ExitCare Patient Information 2015 [employer]. This information is not intended to replace advice given to you by your health care provider. Make sure you discuss any questions you have with your health care provider.  

## 2014-11-25 NOTE — Therapy (Signed)
[facility] [address] [place], [place], [address] Phone: [phone]   Fax:  [phone]  Physical Therapy Treatment  Patient Details  Name: Samuel Shaw MRN: [id number] Date of Birth: 18-Feb-1961 Referring Provider:  Iona Beard, MD  Encounter Date: 11/25/2014      PT End of Session - 11/25/14 1638    Visit Number 25   Date for PT Re-Evaluation 11/27/14   Authorization Type BCBS 90 visit limit combined   PT Start Time 7408   PT Stop Time 1531   PT Time Calculation (min) 39 min      Past Medical History  Diagnosis Date  . Stroke   . Hyperlipidemia     No past surgical history on file.  Filed Vitals:   11/25/14 1505 11/25/14 1506  BP: 140/93 149/95  Pulse: 55 52    Visit Diagnosis:  Lack of coordination due to stroke  Difficulty walking      Subjective Assessment - 11/25/14 1455    Subjective Pt states he sees his PM&R doc on Friday. Therapist recommended pt try calling his PCP again to discuss blood pressure concerns. Pt reports he walked all day without the quad cane yesterday out in community and at home with his wife with him with no unsteadiness.    Currently in Pain? No/denies     Self care: Educated pt on CVA warning signs and risk factors; pt able to recall most of this information from previous episodes of hearing it. Handout provided.  Checked therapy goals: (see additional notes in goals section and flowsheet below) -Demonstrated ability to negotiate 4 stairs with bilateral hand rails, then with single hand rail; both trials with reciprocal step pattern.  -Gait training on indoor level surfaces [phone]' independent without assistive device. Pt continues to demonstrate decreased L knee flexion and extension while ambulating.   Practiced LLE knee flexion + hip extension to tap target behind him (6" step) with single UE support for improved coordination of LLE/knee flexion out of synergy.  (<5 minutes)  Discussed recommendation to continue with supine quads/hip flexor stretch, retro walking at counter, calf stretch, and rocking with staggered feet for HEP (handouts previously provided.)       [facility] PT Assessment - 11/25/14 0001    Observation/Other Assessments   Focus on Therapeutic Outcomes (FOTO)  Functional Status 71   Other Surveys  Other Surveys   Stroke Impact Scale  Mobility 94.4%   Ambulation/Gait   Gait velocity 1.99 ft/sec  without assistive device   Timed Up and Go Test   TUG Normal TUG   Normal TUG (seconds) 13.63                             PT Education - 11/25/14 1524    Education provided Yes   Education Details CVA ed   [employer]) Educated Patient   Methods Explanation;Handout   Comprehension Verbalized understanding;Returned demonstration          PT Short Term Goals - 10/28/14 1537    PT SHORT TERM GOAL #1   Title Demonstrate correct performance of home exercise program to address balance, gait and strength impairments. Target: 09/04/14   Status Achieved   PT SHORT TERM GOAL #2   Title Increase Berg balance test score to 40/56 for improving balance.  Target: 09/04/14   Baseline met 09/01/14- 48/56   Status Achieved   PT SHORT TERM GOAL #3  Title Decrease TUG time to 35 seconds for improving efficiency with functional mobility with LRAD.  Target: 09/04/14   Baseline met 09/01/14- 18.0 sec's   Status Achieved   PT SHORT TERM GOAL #4   Title Begin gait training with either a single point cane or without an assistive device (with AFO) as appropriate.  Target: 09/04/14   Baseline met by 09/01/14   Status Achieved   PT SHORT TERM GOAL #5   Title Demonstrate ability to stand x5 minutes unsupported with upper extremity task for increased activity tolerance.  Target: 09/04/14   Baseline met 09/01/14   Status Achieved   PT SHORT TERM GOAL #6   Title Pt will demonstate ability to ascend/descend stairs with bilteral hand rail and reciprocal  pattern to demonstrate increaed funcitonal strength of LLE and improved efficiency of home access. Target 10/30/14   Status Achieved   PT SHORT TERM GOAL #7   Title Pt will demonstrate ability to step "in/out" of a simulated bathtub 3/3x with UE support for progress toward independent shower/tub transfers. Target 10/30/14   Status Achieved   PT SHORT TERM GOAL #8   Title Pt will ambulate on even indoor surfaces without AFO and without assistive device, independently. Target 10/30/14   Status Achieved           PT Long Term Goals - 11/25/14 1457    PT LONG TERM GOAL #1   Title Verbalize understanding of signs and risk factors of CVA.  Target: 10/03/14   Status Achieved  Achieved on 11/25/14   PT LONG TERM GOAL #2   Title Increase Berg Balance Test score to 47/56 for decreased fall risk.  Target: 10/03/14   Baseline met 09/01/14- 48/56   Status Achieved   PT LONG TERM GOAL #3   Title Ambulate 500' on indoor level surfaces without assistive device independently for increased independence with household ambulation. Target 11/27/14   Baseline Extended to end of renewal plan of care.   Status Achieved  515' without assistive device independent on 11/25/14   PT LONG TERM GOAL #4   Title Increase gait speed without assistive device to >1.8 ft/sec for decreased fall risk. Target: 11/27/14   Baseline extended to end of renewal plan of care   Status Achieved  1.99 ft/sec on 11/25/14   PT LONG TERM GOAL #5   Title Perform TUG in <30 seconds without assistive device with safe turn, independently, for increased safety with household mobility. Target: 10/03/14   Status Achieved  13.63 seconds on 11/25/14 without AD, independent   PT LONG TERM GOAL #6   Title Increase FOTO stroke impact mobility scale to 70% for decreased disability due to CVA. Target: 11/27/14    Baseline extended to end of renewal plan of care   Status Achieved  99.4% on 11/25/14   PT LONG TERM GOAL #7   Title Ambulate on outdoor, uneven  concrete surfaces without assistive device or AFO independently (supervision may be provided only for cognitive reasons, not physical reasons)  Target 11/27/14   Status Not Met  11/23/14 requires supervision due to occasional decreased L hip/knee flexion with decreased foot clearance. Instructed to have supervision for ambulating outside   PT LONG TERM GOAL #8   Title Negotiate 4 stairs with single hand rail and reciprocal pattern.  Target 11/27/14   Status Achieved   PT LONG TERM GOAL  #9   TITLE Demonstrate ability to step in/out of simulated tub without UE support, independently for increased  independence with bathing.  Target 11/27/14   Baseline met on 11/06/14   Status Achieved               Plan - 11/25/14 1639    Clinical Impression Statement Pt met all LTGs except independent ambulation outside goal. Pt continues to require supervision for ambulation on uneven outdoor surfaces with quad cane, but is expected to progress with this over time as he ambulates more on uneven surfaces. D/C today.   Consulted and Agree with Plan of Care Patient        Problem List Patient Active Problem List   Diagnosis Date Noted  . Carotid artery occlusion with infarction 10/08/2014  . Essential hypertension 10/08/2014  . Hemi-neglect of left side 07/16/2014  . Spastic hemiplegia affecting nondominant side 07/10/2014  . Cognitive deficit, post-stroke 07/10/2014  . Sinus bradycardia on ECG 07/10/2014  . 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 CVA (cerebral vascular accident)   . Tobacco abuse   . HLD (hyperlipidemia)   . CVA (cerebral infarction) 07/06/2014   Delrae Sawyers, PT,DPT,NCS 11/25/2014 4:59 PM Phone [phone] FAX [phone]         [facility] [address] [place], [place], [address] Phone: [phone]   Fax:  [phone]   PHYSICAL THERAPY DISCHARGE  SUMMARY  Visits from Start of Care: 25  Current functional level related to goals / functional outcomes: Pt met 8/9 LTGs. See goals section above for details.   Remaining deficits: LLE remains flexed at ~15 degrees of knee flexion throughout gait pattern but able to correct with mirror and verbal cueing. Demonstrates poor carry over of this though. Decreased coordination of LLE.   Education / Equipment: Safety (have supervision when walking outside on uneven surfaces or unfamiliar environments without assistive device for a few weeks until he is consistently safe with this); CVA ed, HEP, recommendation for performing glut stretch prior to attempting to donn pants independently for increased ease.  Plan: Patient agrees to discharge.  Patient goals were met. Patient is being discharged due to meeting the stated rehab goals.  ?????

## 2014-12-01 ENCOUNTER — Ambulatory Visit: Payer: BLUE CROSS/BLUE SHIELD | Admitting: Physical Therapy

## 2014-12-03 ENCOUNTER — Ambulatory Visit: Payer: BLUE CROSS/BLUE SHIELD | Admitting: Physical Therapy

## 2014-12-04 ENCOUNTER — Encounter: Payer: Self-pay | Admitting: Physical Medicine & Rehabilitation

## 2014-12-04 ENCOUNTER — Encounter: Payer: BLUE CROSS/BLUE SHIELD | Attending: Physical Medicine & Rehabilitation

## 2014-12-04 ENCOUNTER — Ambulatory Visit (HOSPITAL_BASED_OUTPATIENT_CLINIC_OR_DEPARTMENT_OTHER): Payer: BLUE CROSS/BLUE SHIELD | Admitting: Physical Medicine & Rehabilitation

## 2014-12-04 VITALS — BP 126/86 | HR 60 | Resp 14

## 2014-12-04 DIAGNOSIS — R414 Neurologic neglect syndrome: Secondary | ICD-10-CM | POA: Insufficient documentation

## 2014-12-04 DIAGNOSIS — G8110 Spastic hemiplegia affecting unspecified side: Secondary | ICD-10-CM

## 2014-12-04 DIAGNOSIS — I63511 Cerebral infarction due to unspecified occlusion or stenosis of right middle cerebral artery: Secondary | ICD-10-CM | POA: Insufficient documentation

## 2014-12-04 DIAGNOSIS — I6931 Cognitive deficits following cerebral infarction: Secondary | ICD-10-CM | POA: Insufficient documentation

## 2014-12-04 NOTE — Patient Instructions (Signed)

## 2014-12-04 NOTE — Progress Notes (Signed)
Subjective:    Patient ID: Samuel Shaw, male    DOB: [birth year], 54 y.o.   MRN: [id number]  HPI  54 year old male who had a right MCA distribution CVA with left hemiparesisOnset in December 2015, was transferred to [facility] on 07/06/2014 where MRI of the brain revealed  Left ICA occlusion with a right MCA distribution CVA. He underwent inpatient rehabilitation at [facility], Went straight to outpatient therapy at [facility] rehabilitation. Patient has now completed outpatient therapy. He is now dressing and bathing independently He ambulates independently without an assistive device.  Patient denies any falls. He states that he was driving with his wife to [place] when she got tired and asked him to take over. He states that he drove without any difficulty. We discussed that he hadn't been released to drive yet.   Pain Inventory Average Pain 0 Pain Right Now 0 My pain is no descriptor  In the last 24 hours, has pain interfered with the following? General activity 3 Relation with others 3 Enjoyment of life 9 What TIME of day is your pain at its worst? night Sleep (in general) Fair  Pain is worse with: some activites Pain improves with: rest and medication Relief from Meds: 5  Mobility walk without assistance how many minutes can you walk? 15 ability to climb steps?  yes do you drive?  no Do you have any goals in this area?  yes  Function not employed: date last employed .  Neuro/Psych weakness  Prior Studies Any changes since last visit?  no  Physicians involved in your care Any changes since last visit?  no   Family History  Problem Relation Age of Onset  . Diabetes Mother    History   Social History  . Marital Status: Married    Spouse Name: [name]  . Number of Children: 3  . Years of Education: 2y coll   Occupational History  .      not working at this time   Social History Main Topics  . Smoking status: Former Smoker -- 1.00 packs/day for 15  years    Types: Cigarettes    Quit date: 06/26/2014  . Smokeless tobacco: Never Used  . Alcohol Use: No  . Drug Use: No  . Sexual Activity: Not on file   Other Topics Concern  . None   Social History Narrative   Caffeine.3 cups coffee, daily.   History reviewed. No pertinent past surgical history. Past Medical History  Diagnosis Date  . Stroke   . Hyperlipidemia    BP 126/86 mmHg  Pulse 60  Resp 14  SpO2 98%  Opioid Risk Score:   Fall Risk Score: Low Fall Risk (0-5 points)`1  Depression screen PHQ 2/9  Depression screen PHQ 2/9 10/05/2014  Decreased Interest 1  Down, Depressed, Hopeless 0  PHQ - 2 Score 1  Altered sleeping 0  Tired, decreased energy 0  Change in appetite 0  Feeling bad or failure about yourself  0  Trouble concentrating 0  Moving slowly or fidgety/restless 1  Suicidal thoughts 0  PHQ-9 Score 2     Review of Systems  Eyes: Negative.   Respiratory: Negative.   Cardiovascular: Negative.   Gastrointestinal: Negative.   Endocrine: Negative.   Genitourinary: Negative.   Musculoskeletal: Negative.   Skin: Negative.   Allergic/Immunologic: Negative.   Neurological: Positive for weakness.  Hematological: Negative.   Psychiatric/Behavioral: Negative.   All other systems reviewed and are negative.  Objective:   Physical Exam  Constitutional: He is oriented to person, place, and time. He appears well-developed and well-nourished.  HENT:  Head: Normocephalic and atraumatic.  Eyes: Conjunctivae are normal. Pupils are equal, round, and reactive to light.  Neurological: He is alert and oriented to person, place, and time. He displays a negative Romberg sign.  Unable to do tandem gait Able to do toe walk and heel walk however  Psychiatric: He has a normal mood and affect.  Nursing note and vitals reviewed.  Gen. No acute distress Visual fields are intact to confrontation testing Extraocular muscles intact Motor strength is 5/5 in the  right deltoid, biceps, triceps, grip, hip flexor, knee extensor, ankle dorsiflexor and plantar flexor 4/5 strength in the left deltoid, biceps, triceps, grip, hip flexor, knee extensor, ankle dorsiflexor Sensation appears equal to light touch bilateral upper extremities Ambulation is with decreased step length as well as some hip hiking on the left side. Slow left finger to thumb opposition     Assessment & Plan:  1. Right MCA distribution infarct with residual mild left hemiparesis as well as left fine motor deficits. His neglect has improved. He is independent with all his self-care and mobility. I think it's reasonable for him to go back to limited driving at this point.  Graduated driving instructions listed below. Not quite ready to go back to work however this may be the next step Will reevaluate in 1 month  Graduated return to driving instructions were provided. It is recommended that the patient first drives with another licensed driver in an empty parking lot. If the patient does well with this, and they can drive on a quiet street with the licensed driver. If the patient does well with this they can drive on a busy street with a licensed driver. If the patient does well with this, the next time out they can go by himself. For the first month after resuming driving, I recommend no nighttime or Interstate driving.

## 2014-12-21 ENCOUNTER — Emergency Department (HOSPITAL_COMMUNITY)
Admission: EM | Admit: 2014-12-21 | Discharge: 2014-12-21 | Disposition: A | Discharge status: Home / Self Care | Payer: BLUE CROSS/BLUE SHIELD | Attending: Emergency Medicine | Admitting: Emergency Medicine

## 2014-12-21 ENCOUNTER — Encounter (HOSPITAL_COMMUNITY): Payer: Self-pay | Admitting: Family Medicine

## 2014-12-21 DIAGNOSIS — Y9389 Activity, other specified: Secondary | ICD-10-CM | POA: Diagnosis not present

## 2014-12-21 DIAGNOSIS — Y998 Other external cause status: Secondary | ICD-10-CM | POA: Diagnosis not present

## 2014-12-21 DIAGNOSIS — Y9289 Other specified places as the place of occurrence of the external cause: Secondary | ICD-10-CM | POA: Insufficient documentation

## 2014-12-21 DIAGNOSIS — Z8673 Personal history of transient ischemic attack (TIA), and cerebral infarction without residual deficits: Secondary | ICD-10-CM | POA: Diagnosis not present

## 2014-12-21 DIAGNOSIS — S40861A Insect bite (nonvenomous) of right upper arm, initial encounter: Secondary | ICD-10-CM | POA: Diagnosis present

## 2014-12-21 DIAGNOSIS — E785 Hyperlipidemia, unspecified: Secondary | ICD-10-CM | POA: Diagnosis not present

## 2014-12-21 DIAGNOSIS — Z87891 Personal history of nicotine dependence: Secondary | ICD-10-CM | POA: Diagnosis not present

## 2014-12-21 DIAGNOSIS — W57XXXA Bitten or stung by nonvenomous insect and other nonvenomous arthropods, initial encounter: Secondary | ICD-10-CM | POA: Diagnosis not present

## 2014-12-21 DIAGNOSIS — R21 Rash and other nonspecific skin eruption: Secondary | ICD-10-CM | POA: Insufficient documentation

## 2014-12-21 DIAGNOSIS — Z7982 Long term (current) use of aspirin: Secondary | ICD-10-CM | POA: Insufficient documentation

## 2014-12-21 DIAGNOSIS — Z79899 Other long term (current) drug therapy: Secondary | ICD-10-CM | POA: Insufficient documentation

## 2014-12-21 MED ORDER — DIPHENHYDRAMINE HCL 25 MG PO TABS: 25.0000 mg | ORAL_TABLET | Freq: Four times a day (QID) | ORAL | Status: DC

## 2014-12-21 MED ORDER — HYDROCORTISONE 1 % EX CREA: TOPICAL_CREAM | CUTANEOUS | Status: DC

## 2014-12-21 NOTE — ED Provider Notes (Signed)
CSN: [id number]     Arrival date & time 12/21/14  1313 History  This chart was scribed for non-physician practitioner, Lucien Mons, PA-C, working with Samuel Morrison, MD by Ladene Artist, ED Scribe. This patient was seen in room TR08C/TR08C and the patient's care was started at 1:29 PM.   Chief Complaint  Patient presents with  . Insect Bite   The history is provided by the patient. No language interpreter was used.   HPI Comments: Samuel Shaw is a 54 y.o. male who presents to the Emergency Department complaining of a painful and itchy rash to left upper arm for the past 3 days. Pt suspects that an unknown insect bit him 3 days ago but he did not see the insect. He reports that rash is only on his left upper arm. He denies new lotions, detergents, soaps, recent hotel visits, contacts with similar rash. No known allergies. Has not tried any alleviating factors for his symptoms. No difficulty breathing or swallowing.  Past Medical History  Diagnosis Date  . Stroke   . Hyperlipidemia    History reviewed. No pertinent past surgical history. Family History  Problem Relation Age of Onset  . Diabetes Mother    History  Substance Use Topics  . Smoking status: Former Smoker -- 1.00 packs/day for 15 years    Types: Cigarettes    Quit date: 06/26/2014  . Smokeless tobacco: Never Used  . Alcohol Use: No    Review of Systems  Constitutional: Negative for fever.  HENT: Negative for trouble swallowing.   Respiratory: Negative for choking.   Skin: Positive for rash.   Allergies  Review of patient's allergies indicates no known allergies.  Home Medications   Prior to Admission medications   Medication Sig Start Date End Date Taking? Authorizing Provider  acetaminophen (TYLENOL) 325 MG tablet Take 2 tablets (650 mg total) by mouth every 6 (six) hours as needed for mild pain or headache. 07/08/14   Albertine Patricia, MD  aspirin 325 MG tablet Take 1 tablet (325 mg total) by mouth daily.  07/29/14   Lavon Paganini Angiulli, PA-C  atorvastatin (LIPITOR) 20 MG tablet Take 1 tablet (20 mg total) by mouth daily at 6 PM. 07/29/14   Lavon Paganini Angiulli, PA-C  diphenhydrAMINE (BENADRYL) 25 MG tablet Take 1 tablet (25 mg total) by mouth every 6 (six) hours. 12/21/14   Carman Ching, PA-C  folic acid (FOLVITE) 086 MCG tablet Take 1 tablet (400 mcg total) by mouth daily. 07/29/14   Lavon Paganini Angiulli, PA-C  griseofulvin (GRIFULVIN V) 500 MG tablet Take 1 tablet by mouth daily. 10/21/14   Historical Provider, MD  hydrocortisone cream 1 % Apply to affected area 2 times daily 12/21/14   Hessie Diener Johnhenry Tippin, PA-C  lisinopril (PRINIVIL,ZESTRIL) 10 MG tablet Take 10 mg by mouth daily.    Historical Provider, MD  nicotine (NICODERM CQ - DOSED IN MG/24 HR) 7 mg/24hr patch Place 1 patch (7 mg total) onto the skin daily. 10/08/14   Rosalin Hawking, MD  tiZANidine (ZANAFLEX) 2 MG tablet Take 1 tablet (2 mg total) by mouth 3 (three) times daily. 10/05/14   Charlett Blake, MD   BP 120/90 mmHg  Pulse 60  Temp(Src) 98.1 F (36.7 C) (Oral)  Resp 20  SpO2 94% Physical Exam  Constitutional: He is oriented to person, place, and time. He appears well-developed and well-nourished. No distress.  HENT:  Head: Normocephalic and atraumatic.  Eyes: Conjunctivae and EOM are normal.  Neck: Neck supple. No tracheal deviation present.  Cardiovascular: Normal rate.   Pulmonary/Chest: Effort normal. No respiratory distress.  Musculoskeletal: Normal range of motion.  Neurological: He is alert and oriented to person, place, and time.  Skin: Skin is warm and dry.  Few wheels on R upper arm with few excoriations from scratching. No secondary infection.  Psychiatric: He has a normal mood and affect. His behavior is normal.  Nursing note and vitals reviewed.  ED Course  Procedures (including critical care time) DIAGNOSTIC STUDIES: Oxygen Saturation is 94% on RA, adequate by my interpretation.    COORDINATION OF CARE: 1:31 PM-Discussed  treatment plan which includes Benadryl and hydrocortisone cream with pt at bedside and pt agreed to plan.   Labs Review Labs Reviewed - No data to display  Imaging Review No results found.   EKG Interpretation None      MDM   Final diagnoses:  Rash  Insect bite   Non-toxic appearing, NAD. Afebrile. VSS. Alert and appropriate for age.  Appear to be mosquito bites that he has scratched. Advise benadryl and hydrocortisone cream. No secondary infection. Stable for d/c. Return precautions given. Patient states understanding of treatment care plan and is agreeable. Discharge vitals- BP 120/90 mmHg  Pulse 60  Temp(Src) 98.1 F (36.7 C) (Oral)  Resp 20  SpO2 94%  I personally performed the services described in this documentation, which was scribed in my presence. The recorded information has been reviewed and is accurate.  Carman Ching, PA-C 12/21/14 1405  Samuel Morrison, MD 12/22/14 [phone]

## 2014-12-21 NOTE — ED Notes (Signed)
Declined W/C at D/C and was escorted to lobby by RN. 

## 2014-12-21 NOTE — ED Notes (Signed)
Pt sts he was bitten by something a few days ago and itching and painful.

## 2014-12-21 NOTE — Discharge Instructions (Signed)
Take benadryl as directed for itching. Apply hydrocortisone cream as directed.  Insect Bite Mosquitoes, flies, fleas, bedbugs, and many other insects can bite. Insect bites are different from insect stings. A sting is when venom is injected into the skin. Some insect bites can transmit infectious diseases. SYMPTOMS  Insect bites usually turn red, swell, and itch for 2 to 4 days. They often go away on their own. TREATMENT  Your caregiver may prescribe antibiotic medicines if a bacterial infection develops in the bite. HOME CARE INSTRUCTIONS  Do not scratch the bite area.  Keep the bite area clean and dry. Wash the bite area thoroughly with soap and water.  Put ice or cool compresses on the bite area.  Put ice in a plastic bag.  Place a towel between your skin and the bag.  Leave the ice on for 20 minutes, 4 times a day for the first 2 to 3 days, or as directed.  You may apply a baking soda paste, cortisone cream, or calamine lotion to the bite area as directed by your caregiver. This can help reduce itching and swelling.  Only take over-the-counter or prescription medicines as directed by your caregiver.  If you are given antibiotics, take them as directed. Finish them even if you start to feel better. You may need a tetanus shot if:  You cannot remember when you had your last tetanus shot.  You have never had a tetanus shot.  The injury broke your skin. If you get a tetanus shot, your arm may swell, get red, and feel warm to the touch. This is common and not a problem. If you need a tetanus shot and you choose not to have one, there is a rare chance of getting tetanus. Sickness from tetanus can be serious. SEEK IMMEDIATE MEDICAL CARE IF:   You have increased pain, redness, or swelling in the bite area.  You see a red line on the skin coming from the bite.  You have a fever.  You have joint pain.  You have a headache or neck pain.  You have unusual weakness.  You have  a rash.  You have chest pain or shortness of breath.  You have abdominal pain, nausea, or vomiting.  You feel unusually tired or sleepy. MAKE SURE YOU:   Understand these instructions.  Will watch your condition.  Will get help right away if you are not doing well or get worse. Document Released: 07/27/2004 Document Revised: 09/11/2011 Document Reviewed: 01/18/2011 [facility] Patient Information 2015 [place], [place]. This information is not intended to replace advice given to you by your health care provider. Make sure you discuss any questions you have with your health care provider.  Rash A rash is a change in the color or texture of your skin. There are many different types of rashes. You may have other problems that accompany your rash. CAUSES   Infections.  Allergic reactions. This can include allergies to pets or foods.  Certain medicines.  Exposure to certain chemicals, soaps, or cosmetics.  Heat.  Exposure to poisonous plants.  Tumors, both cancerous and noncancerous. SYMPTOMS   Redness.  Scaly skin.  Itchy skin.  Dry or cracked skin.  Bumps.  Blisters.  Pain. DIAGNOSIS  Your caregiver may do a physical exam to determine what type of rash you have. A skin sample (biopsy) may be taken and examined under a microscope. TREATMENT  Treatment depends on the type of rash you have. Your caregiver may prescribe certain medicines. For  serious conditions, you may need to see a skin doctor (dermatologist). HOME CARE INSTRUCTIONS   Avoid the substance that caused your rash.  Do not scratch your rash. This can cause infection.  You may take cool baths to help stop itching.  Only take over-the-counter or prescription medicines as directed by your caregiver.  Keep all follow-up appointments as directed by your caregiver. SEEK IMMEDIATE MEDICAL CARE IF:  You have increasing pain, swelling, or redness.  You have a fever.  You have new or severe  symptoms.  You have body aches, diarrhea, or vomiting.  Your rash is not better after 3 days. MAKE SURE YOU:  Understand these instructions.  Will watch your condition.  Will get help right away if you are not doing well or get worse. Document Released: 06/09/2002 Document Revised: 09/11/2011 Document Reviewed: 04/03/2011 [facility] Patient Information 2015 [place], [place]. This information is not intended to replace advice given to you by your health care provider. Make sure you discuss any questions you have with your health care provider.

## 2014-12-28 ENCOUNTER — Ambulatory Visit (HOSPITAL_BASED_OUTPATIENT_CLINIC_OR_DEPARTMENT_OTHER): Payer: BLUE CROSS/BLUE SHIELD | Admitting: Physical Medicine & Rehabilitation

## 2014-12-28 ENCOUNTER — Encounter: Payer: Self-pay | Admitting: Physical Medicine & Rehabilitation

## 2014-12-28 VITALS — BP 133/77 | HR 56 | Resp 14

## 2014-12-28 DIAGNOSIS — G8110 Spastic hemiplegia affecting unspecified side: Secondary | ICD-10-CM

## 2014-12-28 DIAGNOSIS — I69319 Unspecified symptoms and signs involving cognitive functions following cerebral infarction: Secondary | ICD-10-CM

## 2014-12-28 DIAGNOSIS — I6931 Cognitive deficits following cerebral infarction: Secondary | ICD-10-CM | POA: Diagnosis not present

## 2014-12-28 MED ORDER — TIZANIDINE HCL 4 MG PO TABS: 4.0000 mg | ORAL_TABLET | Freq: Three times a day (TID) | ORAL | Status: DC

## 2014-12-28 NOTE — Patient Instructions (Addendum)
Call vocational rehabilitation at [phone]   primary care physician for blood pressure  Consider Botox for left arm tightness

## 2014-12-28 NOTE — Progress Notes (Signed)
Subjective:    Patient ID: Samuel Shaw, male    DOB: Jul 12, 1960, 54 y.o.   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 He underwent inpatient rehabilitation at [facility], Went straight to outpatient therapy at [facility] rehabilitation. Patient has now completed outpatient therapy. He is now dressing and bathing independently He ambulates independently without an assistive device. HPI Patient has done some limited driving with another driver in the car with him. Independent with his straight dressing and bathing still Had insect bite about a week ago seen in the emergency department Still has some problems putting on his left shoe.   Pain Inventory Average Pain 0 Pain Right Now 0 My pain is aching  In the last 24 hours, has pain interfered with the following? General activity 0 Relation with others 0 Enjoyment of life 2 What TIME of day is your pain at its worst? evening Sleep (in general) Fair  Pain is worse with: some activites Pain improves with: rest and medication Relief from Meds: 5  Mobility walk with assistance use a cane how many minutes can you walk? 15 ability to climb steps?  yes do you drive?  yes use a wheelchair  Function not employed: date last employed 06/23/2014 disabled: date disabled 06/23/2014 I need assistance with the following:  dressing, bathing, meal prep, household duties and shopping  Neuro/Psych trouble walking  Prior Studies Any changes since last visit?  no  Physicians involved in your care Any changes since last visit?  no   Family History  Problem Relation Age of Onset  . Diabetes Mother    History   Social History  . Marital Status: Married    Spouse Name: [name]  . Number of Children: 3  . Years of Education: 2y  coll   Occupational History  .      not working at this time   Social History Main Topics  . Smoking status: Former Smoker -- 1.00 packs/day for 15 years    Types: Cigarettes    Quit date: 06/26/2014  . Smokeless tobacco: Never Used  . Alcohol Use: No  . Drug Use: No  . Sexual Activity: Not on file   Other Topics Concern  . None   Social History Narrative   Caffeine.3 cups coffee, daily.   History reviewed. No pertinent past surgical history. Past Medical History  Diagnosis Date  . Stroke   . Hyperlipidemia    BP 133/77 mmHg  Pulse 56  Resp 14  SpO2 98%  Opioid Risk Score:   Fall Risk Score: Low Fall Risk (0-5 points)`1  Depression screen PHQ 2/9  Depression screen PHQ 2/9 10/05/2014  Decreased Interest 1  Down, Depressed, Hopeless 0  PHQ - 2 Score 1  Altered sleeping 0  Tired, decreased energy 0  Change in appetite 0  Feeling bad or failure about yourself  0  Trouble concentrating 0  Moving slowly or fidgety/restless 1  Suicidal thoughts 0  PHQ-9 Score 2     Review of Systems  Musculoskeletal: Positive for gait problem.  All other systems reviewed and are negative.      Objective:   Physical Exam  Constitutional: He is oriented to person, place, and time. He appears well-developed and well-nourished.  HENT:  Head: Normocephalic and atraumatic.  Eyes: Conjunctivae and EOM are normal. Pupils are equal, round, and reactive to light.  Neck: Normal range of motion.  Neurological: He is alert and oriented to person, place, and time.  Psychiatric: He has a normal mood and affect.  Nursing note and vitals reviewed.   Limited left shoulder range of motion, has pain in the left medial arm with shoulder abduction. No evidence of mass no evidence of adenopathy in the left axilla Motor strength is 4/5 and left deltoid, biceps, triceps, grip, hip flexor, knee extensor, ankle dorsiflexor 5/5 on the right side Sensation is reported as equal on both upper and  lower limbs Visual fields are intact confrontation testing       Assessment & Plan:  1. Right MCA disposition CVA approximate 6 months post. I think he's reached a plateau in his function he still has some mild left hemiparesis cognitive deficits that are mild. 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

## 2015-01-13 ENCOUNTER — Telehealth: Payer: Self-pay | Admitting: *Deleted

## 2015-01-13 NOTE — Telephone Encounter (Signed)
Patient was worked into Dr. Letta Pate schedule for Botox, please make sure we get auth for this visit-scheduled on 01/25/15

## 2015-01-13 NOTE — Telephone Encounter (Signed)
Samuel Shaw would like to proceed with the Botox.  His appt is 01/25/15.  How much Botox should be planned for?

## 2015-01-13 NOTE — Telephone Encounter (Signed)
200 units, G 81.1, [id number]

## 2015-01-25 ENCOUNTER — Encounter: Payer: Self-pay | Admitting: Physical Medicine & Rehabilitation

## 2015-01-25 ENCOUNTER — Ambulatory Visit (HOSPITAL_BASED_OUTPATIENT_CLINIC_OR_DEPARTMENT_OTHER): Payer: BLUE CROSS/BLUE SHIELD | Admitting: Physical Medicine & Rehabilitation

## 2015-01-25 ENCOUNTER — Encounter: Payer: BLUE CROSS/BLUE SHIELD | Attending: Physical Medicine & Rehabilitation

## 2015-01-25 VITALS — BP 112/74 | HR 58 | Resp 14

## 2015-01-25 DIAGNOSIS — I63511 Cerebral infarction due to unspecified occlusion or stenosis of right middle cerebral artery: Secondary | ICD-10-CM | POA: Insufficient documentation

## 2015-01-25 DIAGNOSIS — I6931 Cognitive deficits following cerebral infarction: Secondary | ICD-10-CM | POA: Insufficient documentation

## 2015-01-25 DIAGNOSIS — R414 Neurologic neglect syndrome: Secondary | ICD-10-CM | POA: Insufficient documentation

## 2015-01-25 DIAGNOSIS — G8110 Spastic hemiplegia affecting unspecified side: Secondary | ICD-10-CM | POA: Insufficient documentation

## 2015-01-25 NOTE — Progress Notes (Signed)
Botox Injection for spasticity using needle EMG guidance  Dilution: 50 Units/ml Indication: Severe spasticity which interferes with ADL,mobility and/or  hygiene and is unresponsive to medication management and other conservative care Informed consent was obtained after describing risks and benefits of the procedure with the patient. This includes bleeding, bruising, infection, excessive weakness, or medication side effects. A REMS form is on file and signed. Needle: 27g 1" needle electrode Number of units per muscle Pectoralis50 Biceps150  All injections were done after obtaining appropriate EMG activity and after negative drawback for blood. The patient tolerated the procedure well. Post procedure instructions were given. A followup appointment was made.

## 2015-01-25 NOTE — Patient Instructions (Signed)

## 2015-02-08 ENCOUNTER — Encounter: Payer: Self-pay | Admitting: Neurology

## 2015-02-08 ENCOUNTER — Ambulatory Visit (INDEPENDENT_AMBULATORY_CARE_PROVIDER_SITE_OTHER): Payer: BLUE CROSS/BLUE SHIELD | Admitting: Neurology

## 2015-02-08 VITALS — BP 131/98 | HR 70 | Ht 74.0 in | Wt 176.6 lb

## 2015-02-08 DIAGNOSIS — I63131 Cerebral infarction due to embolism of right carotid artery: Secondary | ICD-10-CM | POA: Diagnosis not present

## 2015-02-08 DIAGNOSIS — I10 Essential (primary) hypertension: Secondary | ICD-10-CM

## 2015-02-08 DIAGNOSIS — I639 Cerebral infarction, unspecified: Secondary | ICD-10-CM

## 2015-02-08 DIAGNOSIS — E785 Hyperlipidemia, unspecified: Secondary | ICD-10-CM | POA: Diagnosis not present

## 2015-02-08 DIAGNOSIS — I63239 Cerebral infarction due to unspecified occlusion or stenosis of unspecified carotid arteries: Secondary | ICD-10-CM

## 2015-02-08 NOTE — Patient Instructions (Signed)
-   continue ASA and lipitor for stroke prevention - check BP at home, BP goal 120-140. Avoid low BP due to your right carotid artery occlusion - Follow up with your primary care physician for stroke risk factor modification. Recommend maintain blood pressure goal 120-140, diabetes with hemoglobin A1c goal below 6.5% and lipids with LDL cholesterol goal below 70 mg/dL.  - discuss with PCP about sleeping pills. We recommend trazodone 50mg  at night to begin with.  - follow up in 6 months.

## 2015-02-09 DIAGNOSIS — I63131 Cerebral infarction due to embolism of right carotid artery: Secondary | ICD-10-CM | POA: Insufficient documentation

## 2015-02-09 NOTE — Progress Notes (Signed)
STROKE NEUROLOGY FOLLOW UP NOTE  NAME: Samuel Shaw DOB: 10-07-1960  REASON FOR VISIT: stroke follow up HISTORY FROM: wife and chart  Today we had the pleasure of seeing Samuel Shaw in follow-up at our Neurology Clinic. Pt was accompanied by wife.   History Summary Samuel Shaw is an 54 y.o. male PMH of 2 TIAs 2 years ago, right carotid stenosis but no intervention, and current smoker was admitted on 07/06/14 for subacute CVA in late 06/2014 in [place] with progressive left-sided weakness since the discharge. These symptoms are similar to his stroke in December, the family just feels they are getting worse. A review of records from [place] are pertinent for a head CT showing an acute right basal ganglia infarct and he is on ASA 325mg  daily. MRI showed multifocal subacute to remote infarct at right MCA territory (no new infarct) and CTA neck showed right ICA occlusion, athero vs. Dissection. He was continued on discharged to [facility] .   Follow up 10/08/14 - the patient has been doing well. Discharged from [facility] on 07/29/14 and currently outpt PT/OT, 2 times per week. He made significant progress from rehab. Currently he is walking with cane and steady. His BP is 118/85 today, he is still on ASA and lipitor. He has quit smoking and on nicotine patch now.  Interval History During the interval time, pt has been doing well. Quit smoking completely. Had CTA head and neck did not show right ICA recannulization. TCD emboli detection negative and VMR showed bilateral changes. Has finished off PT/OT and also stopped BP meds due to low BP. Today 131/98 in clinic. He is not doing much exercise at home.   REVIEW OF SYSTEMS: Full 14 system review of systems performed and notable only for those listed below and in HPI above, all others are negative:  Constitutional:  Activity change Cardiovascular:  Ear/Nose/Throat:   Skin:  Eyes:   Respiratory:   Gastroitestinal:   Genitourinary:    Hematology/Lymphatic:   Endocrine:  Musculoskeletal:   Allergy/Immunology:   Neurological:  HA, weakness Psychiatric:  Sleep:   The following represents the patient's updated allergies and side effects list: No Known Allergies  The neurologically relevant items on the patient's problem list were reviewed on today's visit.  Neurologic Examination  A problem focused neurological exam (12 or more points of the single system neurologic examination, vital signs counts as 1 point, cranial nerves count for 8 points) was performed.  Blood pressure 131/98, pulse 70, height 6\' 2"  (1.88 m), weight 176 lb 9.6 oz (80.105 kg).  General - Well nourished, well developed, in no apparent distress.  Ophthalmologic - Sharp disc margins OU.  Cardiovascular - Regular rate and rhythm with no murmur.  Mental Status -  Level of arousal and orientation to time, place, and person were intact. Language including expression, naming, repetition, comprehension was assessed and found intact.  Cranial Nerves II - XII - II - Visual field intact OU. III, IV, VI - Extraocular movements intact. V - Facial sensation intact bilaterally. VII - mild left facial droop. VIII - Hearing & vestibular intact bilaterally. X - Palate elevates symmetrically. XI - Chin turning & shoulder shrug intact bilaterally. XII - Tongue protrusion intact.  Motor Strength - The patient's strength was LUE 5-/5 without pronator drift, LLE 4/5 proximal and 4+/5 distally.  Bulk was normal and fasciculations were absent.   Motor Tone - Muscle tone was assessed at the neck and appendages and was normal.  Reflexes - The patient's reflexes were normal in all extremities and he had no pathological reflexes.  Sensory - Light touch, temperature/pinprick were assessed and were normal.    Coordination - The patient had normal movements in the hands and feet with no ataxia or dysmetria.  Tremor was absent.  Gait and Station - walk with cane  and hemiparetic gait on the left.  Data reviewed: I personally reviewed the images and agree with the radiology interpretations.  Dg Chest 2 View 07/06/2014 CLINICAL DATA: Increasing left-sided weakness. Previous stroke. Smoker. EXAM: CHEST 2 VIEW COMPARISON: 10/31/2008. FINDINGS: Normal sized heart. Clear lungs. Increased density overlying the posterior aspect of the lower thoracic spine. IMPRESSION: Possible mass overlying the posterior aspect of the lower thoracic spine. A chest CT with contrast is recommended. Electronically Signed By: Enrique Sack M.D.  On: 07/06/2014 15:54   Ct Head Wo Contrast 07/06/2014 No acute finding. Findings consistent with subacute to remote right MCA territory infarct. Chronic microvascular ischemic change also noted.   Mr Brain Wo Contrast 07/06/2014 Acute multifocal infarcts within RIGHT middle cerebral artery territory (including RIGHT basal ganglia). RIGHT anterior occipital likely within RIGHT middle cerebral artery territory/watershed. Limited blood sensitive sequence without lobar hematoma. Mild white matter changes, independent of acute areas of ischemia suggest chronic small vessel ischemic disease.   Mr Samuel Shaw Head/brain Wo Cm 07/06/2014 RIGHT internal carotid artery occlusion. Bilateral posterior communicating arteries and suspected tiny anterior communicating artery present. Diminutive RIGHT middle cerebral artery flow related enhancement. Recommend CT angiogram of the neck for further evaluation.   CT angio neck 07/07/2014 Right internal carotid artery is occluded 1 cm above its origin. Etiology indeterminate. Focal plaque proximal left internal carotid artery without measurable/significant stenosis. Mild narrowing proximal right vertebral artery. Slightly ectatic ascending thoracic aorta (3.4 cm). Surrounding incidental findings.  2D echo - Left ventricle: The cavity size was normal. Systolic function was normal. The estimated  ejection fraction was in the range of 55% to 60%. Wall motion was normal; there were no regional wall motion abnormalities.  CTA head and neck 10/20/14 - Complete occlusion RIGHT internal carotid artery 1 cm above its origin. This likely relates either to dissection or focal atheromatous plaque. Reconstitution of the RIGHT anterior circulation intracranially via collateral flow No acute findings. Chronic RIGHT hemisphere MCA territory infarction.  TCD MES -  Negative for 48min  TCD VMR - decreased reserve bilaterally  Component     Latest Ref Rng 07/07/2014  Cholesterol     0 - 200 mg/dL 182  Triglycerides     <150 mg/dL 116  HDL     >39 mg/dL 24 (L)  Total CHOL/HDL Ratio      7.6  VLDL     0 - 40 mg/dL 23  LDL (calc)     0 - 99 mg/dL 135 (H)  Hemoglobin A1C     <5.7 % 5.6  Mean Plasma Glucose     <117 mg/dL 114  TSH     0.350 - 4.500 uIU/mL 0.473    Assessment: As you may recall, he is a 54 y.o. African American male with PMH of TIA, right carotid stenosis without intervention, smoker was admitted due to right MCA territory stroke. Found to have right ICA occlusion. He seems to have TIAs in 2010 and found to have right ICA stenosis at that time but no intervention done. His ICA occlusion likely due to athero, but dissection is also possibility as left ICA was smooth. Repeat CTA head and  neck showed no recannulization. TCD MES negative. Has quit smoking.  Plan:  - continue ASA and lipitor for stroke prevention - check BP at home, BP goal 120-140. Avoid low BP due to your right carotid artery occlusion - Follow up with your primary care physician for stroke risk factor modification. Recommend maintain blood pressure goal 120-140, diabetes with hemoglobin A1c goal below 6.5% and lipids with LDL cholesterol goal below 70 mg/dL.  - continue home exercise for right side mild weakness. - RTC in 6 months.    No orders of the defined types were placed in this encounter.     Meds ordered this encounter  Medications  . DISCONTD: tiZANidine (ZANAFLEX) 2 MG tablet    Sig:   . fexofenadine (ALLEGRA) 30 MG tablet    Sig: Take 30 mg by mouth 2 (two) times daily.    Patient Instructions  - continue ASA and lipitor for stroke prevention - check BP at home, BP goal 120-140. Avoid low BP due to your right carotid artery occlusion - Follow up with your primary care physician for stroke risk factor modification. Recommend maintain blood pressure goal 120-140, diabetes with hemoglobin A1c goal below 6.5% and lipids with LDL cholesterol goal below 70 mg/dL.  - discuss with PCP about sleeping pills. We recommend trazodone 50mg  at night to begin with.  - follow up in 6 months.   Rosalin Hawking, MD PhD [facility] [address] [place], [place] [address] [phone]

## 2015-02-24 ENCOUNTER — Telehealth: Payer: Self-pay | Admitting: *Deleted

## 2015-02-24 NOTE — Telephone Encounter (Signed)
Pt's wife called on behalf of patient asking if you would prescribe pain medication for his arm pain

## 2015-02-25 MED ORDER — TRAMADOL HCL 50 MG PO TABS: 50.0000 mg | ORAL_TABLET | Freq: Two times a day (BID) | ORAL | Status: DC

## 2015-02-25 NOTE — Telephone Encounter (Signed)
May call in tramadol #60 1 po BID 0 RF

## 2015-03-09 ENCOUNTER — Encounter: Payer: BLUE CROSS/BLUE SHIELD | Attending: Physical Medicine & Rehabilitation

## 2015-03-09 ENCOUNTER — Ambulatory Visit: Payer: BLUE CROSS/BLUE SHIELD | Admitting: Physical Medicine & Rehabilitation

## 2015-03-09 DIAGNOSIS — I6931 Cognitive deficits following cerebral infarction: Secondary | ICD-10-CM | POA: Insufficient documentation

## 2015-03-09 DIAGNOSIS — I63511 Cerebral infarction due to unspecified occlusion or stenosis of right middle cerebral artery: Secondary | ICD-10-CM | POA: Insufficient documentation

## 2015-03-09 DIAGNOSIS — R414 Neurologic neglect syndrome: Secondary | ICD-10-CM | POA: Insufficient documentation

## 2015-03-09 DIAGNOSIS — G8110 Spastic hemiplegia affecting unspecified side: Secondary | ICD-10-CM | POA: Insufficient documentation

## 2015-03-15 ENCOUNTER — Ambulatory Visit (HOSPITAL_BASED_OUTPATIENT_CLINIC_OR_DEPARTMENT_OTHER): Payer: BLUE CROSS/BLUE SHIELD | Admitting: Physical Medicine & Rehabilitation

## 2015-03-15 ENCOUNTER — Encounter: Payer: Self-pay | Admitting: Physical Medicine & Rehabilitation

## 2015-03-15 VITALS — BP 117/81 | HR 62

## 2015-03-15 DIAGNOSIS — M7542 Impingement syndrome of left shoulder: Secondary | ICD-10-CM | POA: Diagnosis not present

## 2015-03-15 DIAGNOSIS — G8110 Spastic hemiplegia affecting unspecified side: Secondary | ICD-10-CM | POA: Diagnosis present

## 2015-03-15 DIAGNOSIS — I6931 Cognitive deficits following cerebral infarction: Secondary | ICD-10-CM | POA: Diagnosis not present

## 2015-03-15 DIAGNOSIS — R414 Neurologic neglect syndrome: Secondary | ICD-10-CM | POA: Diagnosis not present

## 2015-03-15 DIAGNOSIS — G8114 Spastic hemiplegia affecting left nondominant side: Secondary | ICD-10-CM | POA: Diagnosis not present

## 2015-03-15 DIAGNOSIS — I63511 Cerebral infarction due to unspecified occlusion or stenosis of right middle cerebral artery: Secondary | ICD-10-CM | POA: Diagnosis not present

## 2015-03-15 NOTE — Patient Instructions (Signed)
Joint Injection  Care After  Refer to this sheet in the next few days. These instructions provide you with information on caring for yourself after you have had a joint injection. Your caregiver also may give you more specific instructions. Your treatment has been planned according to current medical practices, but problems sometimes occur. Call your caregiver if you have any problems or questions after your procedure.  After any type of joint injection, it is not uncommon to experience:  · Soreness, swelling, or bruising around the injection site.  · Mild numbness, tingling, or weakness around the injection site caused by the numbing medicine used before or with the injection.  It also is possible to experience the following effects associated with the specific agent after injection:  · Iodine-based contrast agents:  ¨ Allergic reaction (itching, hives, widespread redness, and swelling beyond the injection site).  · Corticosteroids (These effects are rare.):  ¨ Allergic reaction.  ¨ Increased blood sugar levels (If you have diabetes and you notice that your blood sugar levels have increased, notify your caregiver).  ¨ Increased blood pressure levels.  ¨ Mood swings.  · Hyaluronic acid in the use of viscosupplementation.  ¨ Temporary heat or redness.  ¨ Temporary rash and itching.  ¨ Increased fluid accumulation in the injected joint.  These effects all should resolve within a day after your procedure.   HOME CARE INSTRUCTIONS  · Limit yourself to light activity the day of your procedure. Avoid lifting heavy objects, bending, stooping, or twisting.  · Take prescription or over-the-counter pain medication as directed by your caregiver.  · You may apply ice to your injection site to reduce pain and swelling the day of your procedure. Ice may be applied 03-04 times:  ¨ Put ice in a plastic bag.  ¨ Place a towel between your skin and the bag.  ¨ Leave the ice on for no longer than 15-20 minutes each time.  SEEK  IMMEDIATE MEDICAL CARE IF:   · Pain and swelling get worse rather than better or extend beyond the injection site.  · Numbness does not go away.  · Blood or fluid continues to leak from the injection site.  · You have chest pain.  · You have swelling of your face or tongue.  · You have trouble breathing or you become dizzy.  · You develop a fever, chills, or severe tenderness at the injection site that last longer than 1 day.  MAKE SURE YOU:  · Understand these instructions.  · Watch your condition.  · Get help right away if you are not doing well or if you get worse.  Document Released: 03/02/2011 Document Revised: 09/11/2011 Document Reviewed: 03/02/2011  ExitCare® Patient Information ©2015 [employer]. This information is not intended to replace advice given to you by your health care provider. Make sure you discuss any questions you have with your health care provider.

## 2015-03-15 NOTE — Progress Notes (Signed)
Subjective:    Patient ID: Samuel Shaw, male    DOB: [birth year], 54 y.o.   MRN: [id number]  HPI 01/25/2015 Botox 200 units Pectoralis50 Biceps150 "That shot didn't help him at all"  Patient complains of left shoulder pain more over the deltoid area then around the joint area. Patient has increased pain when trying to get dressed and putting his arm overhead. His wife does help him with his dressing. He remains independent with his mobility. Does not use an assistive device.  Pain Inventory Average Pain 4 Pain Right Now 4 My pain is burning  In the last 24 hours, has pain interfered with the following? General activity 6 Relation with others 7 Enjoyment of life 7 What TIME of day is your pain at its worst? all Sleep (in general) Fair  Pain is worse with: bending and inactivity Pain improves with: medication Relief from Meds: 0  Mobility walk without assistance use a cane how many minutes can you walk? 20-30 ability to climb steps?  yes do you drive?  yes  Function disabled: date disabled 08/01/14 I need assistance with the following:  dressing, bathing, toileting, meal prep, household duties and shopping  Neuro/Psych weakness tremor spasms  Prior Studies Any changes since last visit?  no  Physicians involved in your care Any changes since last visit?  no   Family History  Problem Relation Age of Onset  . Diabetes Mother    Social History   Social History  . Marital Status: Married    Spouse Name: [name]  . Number of Children: 3  . Years of Education: 2y coll   Occupational History  .      not working at this time   Social History Main Topics  . Smoking status: Former Smoker -- 1.00 packs/day for 15 years    Types: Cigarettes    Quit date: 06/26/2014  . Smokeless tobacco: Never Used  . Alcohol Use: No  . Drug Use: No  . Sexual Activity: Not Asked   Other Topics Concern  . None   Social History Narrative   Caffeine.3 cups coffee,  daily.   History reviewed. No pertinent past surgical history. Past Medical History  Diagnosis Date  . Stroke   . Hyperlipidemia    BP 117/81 P 62 o2 sat 98% Opioid Risk Score:   Fall Risk Score:  `1  Depression screen PHQ 2/9  Depression screen [facility] 2/9 03/15/2015 01/25/2015 10/05/2014  Decreased Interest 0 0 1  Down, Depressed, Hopeless 0 0 0  PHQ - 2 Score 0 0 1  Altered sleeping - - 0  Tired, decreased energy - - 0  Change in appetite - - 0  Feeling bad or failure about yourself  - - 0  Trouble concentrating - - 0  Moving slowly or fidgety/restless - - 1  Suicidal thoughts - - 0  PHQ-9 Score - - 2     Review of Systems  Cardiovascular: Positive for leg swelling.  Gastrointestinal: Positive for constipation.  Musculoskeletal: Positive for gait problem.       Spasms  Neurological: Positive for tremors, weakness and numbness.       Tingling  All other systems reviewed and are negative.      Objective:   Physical Exam  Patient has 3 minus strength at the left deltoid limited by pain biceps is 4 triceps is 4 left finger flexors and wrist extensors are 4/5. Right side is 5/5 in all muscle groups Positive impingement  sign left shoulder pain occurs at around 80 of abduction as well as adduction degrees of forward flexion. He also has limited external rotation of left shoulder approximately 20 before there is pain limiting  Mood and affect are flat Extremities without edema Left hand without tenderness to palpation Ashworth score of 2 at the left biceps      Assessment & Plan:  1. 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 I do think he has plateaued in terms of his level of functional recovery. His biceps spasticity did improve after the Botox however his pain did not. He has no tenderness around the shoulder area or around the biceps tendon. He does have positive impingement signs I'm suspicious that he may  have subacromial impingement plus minus frozen shoulder. We'll do a subacromial injection today Follow-up in 2 months  Shoulder injection   Indication:Left Shoulder pain not relieved by medication management and other conservative care.  Informed consent was obtained after describing risks and benefits of the procedure with the patient, this includes bleeding, bruising, infection and medication side effects. The patient wishes to proceed and has given written consent. Patient was placed in a seated position. TheLeft shoulder was marked and prepped with betadine in the subacromial area. A 25-gauge 1-1/2 inch needle was inserted into the subacromial area. After negative draw back for blood, a solution containing 1 mL of 6 mg per ML betamethasone and 4 mL of 1% lidocaine was injected. A band aid was applied. The patient tolerated the procedure well. Post procedure instructions were given.

## 2015-03-24 ENCOUNTER — Other Ambulatory Visit: Payer: Self-pay | Admitting: Physical Medicine & Rehabilitation

## 2015-03-30 ENCOUNTER — Ambulatory Visit: Payer: BLUE CROSS/BLUE SHIELD | Admitting: Physical Medicine & Rehabilitation

## 2015-03-30 ENCOUNTER — Other Ambulatory Visit: Payer: Self-pay | Admitting: Physical Medicine & Rehabilitation

## 2015-04-09 ENCOUNTER — Telehealth: Payer: Self-pay | Admitting: Neurology

## 2015-04-09 NOTE — Telephone Encounter (Signed)
Discussed with wife over the phone. She stated that he was started on lisinopril by his PCP due to high BP. However, they do not have BP device at home to check BP. He took lisinopril this morning with breakfast. When he stood up, he felt dizziness, lightheaded and not able to stand.   His last CTA showed right ICA remains closed and at his last clinic visit, I mentioned that his BP can not be too low. I suspect his symptoms are due to low BP. Wife is going to get BP device today. I told her that if his SBP is less than 150, no need to treat. I recommend pt to remain in bed if he does not feel well getting up. If gets up, he should be slow and avoid fall. Wife expressed appreciation and understanding. She will call back if condition does not get better over time.   Rosalin Hawking, MD PhD Stroke Neurology 04/09/2015 1:05 PM

## 2015-04-09 NOTE — Telephone Encounter (Signed)
Patient's wife is calling stating the patient had problems with dizziness and headache yesterday. When he stood up he got lightheaded and fell back into the chair. The patient's wife says the patient is still in bed so she does not know how he is today.Please call and discuss.

## 2015-04-09 NOTE — Telephone Encounter (Signed)
Called back, the first number is wrong number and the second number, nobody picked up the phone. I left message for pt or his wife to call back. Thanks.  Rosalin Hawking, MD PhD Stroke Neurology 04/09/2015 12:18 PM

## 2015-05-13 ENCOUNTER — Encounter: Payer: Medicaid Other | Attending: Physical Medicine & Rehabilitation

## 2015-05-13 ENCOUNTER — Ambulatory Visit (HOSPITAL_BASED_OUTPATIENT_CLINIC_OR_DEPARTMENT_OTHER): Payer: BLUE CROSS/BLUE SHIELD | Admitting: Physical Medicine & Rehabilitation

## 2015-05-13 ENCOUNTER — Encounter: Payer: Self-pay | Admitting: Physical Medicine & Rehabilitation

## 2015-05-13 VITALS — BP 126/88 | HR 64

## 2015-05-13 DIAGNOSIS — G8110 Spastic hemiplegia affecting unspecified side: Secondary | ICD-10-CM | POA: Diagnosis present

## 2015-05-13 DIAGNOSIS — G890 Central pain syndrome: Secondary | ICD-10-CM | POA: Insufficient documentation

## 2015-05-13 DIAGNOSIS — I6931 Attention and concentration deficit following cerebral infarction: Secondary | ICD-10-CM | POA: Diagnosis not present

## 2015-05-13 DIAGNOSIS — I63511 Cerebral infarction due to unspecified occlusion or stenosis of right middle cerebral artery: Secondary | ICD-10-CM | POA: Insufficient documentation

## 2015-05-13 DIAGNOSIS — M7502 Adhesive capsulitis of left shoulder: Secondary | ICD-10-CM | POA: Diagnosis not present

## 2015-05-13 DIAGNOSIS — R414 Neurologic neglect syndrome: Secondary | ICD-10-CM | POA: Insufficient documentation

## 2015-05-13 MED ORDER — GABAPENTIN 300 MG PO CAPS: 300.0000 mg | ORAL_CAPSULE | Freq: Three times a day (TID) | ORAL | Status: DC

## 2015-05-13 NOTE — Patient Instructions (Signed)
shoulder pain Is caused by several problems Frozen shoulder- Injection today Pain related to stroke- Gabapentin, new medicine Spasticity related to stroke- Zanaflex , consider repeat Botox

## 2015-05-13 NOTE — Progress Notes (Signed)
Subjective:    Patient ID: Samuel Shaw, male    DOB: 11-Jul-1960, 54 y.o.   MRN: [id number]  HPI  54 year old male with right MCA distribution infarct resulting in chronic left hemiparesis and spasticity as well as shoulder pain. His main complaint today is his left shoulder pain. She underwent subacromial injection approximately one month ago which was not helpful for him. He has hadBotulinum toxin injection 150 units into the left biceps and 50 units in the left pectoralis. This has produced some mild reduction in muscle spasticity. This was performed in July 2016. Patient remains independent with dressing and bathing ambulates without assistive device. He drives. 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 General activity 7 Relation with others 8 Enjoyment of life 9 What TIME of day is your pain at its worst? morning Sleep (in general) Fair  Pain is worse with: bending and closing hand Pain improves with: NA Relief from Meds: 0  Mobility walk with assistance use a cane how many minutes can you walk? 5 ability to climb steps?  yes do you drive?  yes use a wheelchair transfers alone Do you have any goals in this area?  no  Function retired I need assistance with the following:  dressing, bathing, meal prep, household duties and shopping  Neuro/Psych weakness tremor trouble walking  Prior Studies Any changes since last visit?  no  Physicians involved in your care Any changes since last visit?  no   Family History  Problem Relation Age of Onset  . Diabetes Mother    Social History   Social History  . Marital Status: Married    Spouse Name: [name]  . Number of Children: 3  . Years of Education: 2y coll   Occupational History  .      not working at this time   Social History Main Topics   . Smoking status: Former Smoker -- 1.00 packs/day for 15 years    Types: Cigarettes    Quit date: 06/26/2014  . Smokeless tobacco: Never Used  . Alcohol Use: No  . Drug Use: No  . Sexual Activity: Not Asked   Other Topics Concern  . None   Social History Narrative   Caffeine.3 cups coffee, daily.   History reviewed. No pertinent past surgical history. Past Medical History  Diagnosis Date  . Stroke ([place])   . Hyperlipidemia    BP 126/88 mmHg  Pulse 64  SpO2 98%  Opioid Risk Score:   Fall Risk Score:  `1  Depression screen PHQ 2/9  Depression screen [facility] 2/9 03/15/2015 01/25/2015 10/05/2014  Decreased Interest 0 0 1  Down, Depressed, Hopeless 0 0 0  PHQ - 2 Score 0 0 1  Altered sleeping - - 0  Tired, decreased energy - - 0  Change in appetite - - 0  Feeling bad or failure about yourself  - - 0  Trouble concentrating - - 0  Moving slowly or fidgety/restless - - 1  Suicidal thoughts - - 0  PHQ-9 Score - - 2     Review of Systems  Neurological: Positive for tremors and weakness.       Gait Instabiltity  All other systems reviewed and are negative.      Objective:   Physical Exam  Constitutional: He  is oriented to person, place, and time. He appears well-developed and well-nourished.  HENT:  Head: Normocephalic and atraumatic.  Eyes: Conjunctivae and EOM are normal. Pupils are equal, round, and reactive to light.  Musculoskeletal:  Limited range of motion left shoulder pain with external rotation even with a few degrees. Also has pain with abduction with limited range of motion.  Neurological: He is alert and oriented to person, place, and time.  Psychiatric: He has a normal mood and affect.  Nursing note and vitals reviewed. No swelling in the arm or wrist area. No pain with palpation of the wrist or hand or finger joints. No hypersensitivity to touch in the left upper limb        Assessment & Plan:   1. Left hemiplegic shoulder pain multifactorial he  does have evidence of adhesive capsulitis as well as neuropathic pain from the stroke. In addition he has spasticity in the left upper extremity which is limiting range of motion at the shoulder.  Recommend following  1. Left glenohumeral injection we'll do today 2.Continue Zanaflex 4 mg 3 times a day for spasticity consider repeat Botox with higher dose of 100 units in the pectoralis and 200 units in the biceps 3. Trial of gabapentin 300 mg 3 times a day monitor for drowsiness  Discussed recommendations with the patient as well as his wife.  Shoulder injection Left glenohumeral joint   Indication:Shoulder pain not relieved by medication management and other conservative care.  Informed consent was obtained after describing risks and benefits of the procedure with the patient, this includes bleeding, bruising, infection and medication side effects. The patient wishes to proceed and has given written consent. Patient was placed in a seated position. The Left shoulder was marked and prepped with betadine in the subacromial area. A 25-gauge 1-1/2 inch needle was inserted into the Glenohumeral joint After negative draw back for blood, a solution containing 1 mL of 6 mg per ML betamethasone and 4 mL of 1% lidocaine was injected. A band aid was applied. The patient tolerated the procedure well. Post procedure instructions were given.

## 2015-06-04 ENCOUNTER — Ambulatory Visit: Payer: Medicaid Other | Admitting: Physical Medicine & Rehabilitation

## 2015-06-07 ENCOUNTER — Encounter: Payer: Self-pay | Admitting: Physical Medicine & Rehabilitation

## 2015-06-07 ENCOUNTER — Ambulatory Visit (HOSPITAL_BASED_OUTPATIENT_CLINIC_OR_DEPARTMENT_OTHER): Payer: Medicaid Other | Admitting: Physical Medicine & Rehabilitation

## 2015-06-07 ENCOUNTER — Encounter: Payer: Medicaid Other | Attending: Physical Medicine & Rehabilitation

## 2015-06-07 VITALS — BP 124/83 | HR 60 | Resp 14

## 2015-06-07 DIAGNOSIS — M7501 Adhesive capsulitis of right shoulder: Secondary | ICD-10-CM

## 2015-06-07 DIAGNOSIS — I6931 Attention and concentration deficit following cerebral infarction: Secondary | ICD-10-CM | POA: Insufficient documentation

## 2015-06-07 DIAGNOSIS — I63511 Cerebral infarction due to unspecified occlusion or stenosis of right middle cerebral artery: Secondary | ICD-10-CM | POA: Diagnosis not present

## 2015-06-07 DIAGNOSIS — G8110 Spastic hemiplegia affecting unspecified side: Secondary | ICD-10-CM | POA: Diagnosis not present

## 2015-06-07 DIAGNOSIS — R414 Neurologic neglect syndrome: Secondary | ICD-10-CM | POA: Diagnosis not present

## 2015-06-07 NOTE — Progress Notes (Signed)
Subjective:    Patient ID: Samuel Shaw, male    DOB: [birth year], 54 y.o.   MRN: [id number]  HPI 54 year old male with history of right MCA distribution infarct greater than 6 months ago.He's had chronic left-sided weakness but has regained independence with dressing and bathing grooming and mobility. Patient complains of pain in the left shoulder primarily.Patient complains of some pain in his left hand, No hand swelling no trauma. Patient had good results with Left glenohumeral injection, The patient did not have good results with a subacromial injection on the left. Patient had some relief of shoulder pain with pectoralis and biceps Botox injection in July. Pain Inventory Average Pain 3 Pain Right Now 3 My pain is aching  In the last 24 hours, has pain interfered with the following? General activity 4 Relation with others 7 Enjoyment of life 9 What TIME of day is your pain at its worst? morning Sleep (in general) Fair  Pain is worse with: some activites Pain improves with: injections Relief from Meds: 2  Mobility walk without assistance walk with assistance use a cane how many minutes can you walk? 15 ability to climb steps?  yes do you drive?  yes transfers alone Do you have any goals in this area?  yes  Function not employed: date last employed . disabled: date disabled . I need assistance with the following:  feeding and bathing  Neuro/Psych trouble walking  Prior Studies Any changes since last visit?  no  Physicians involved in your care Any changes since last visit?  no   Family History  Problem Relation Age of Onset  . Diabetes Mother    Social History   Social History  . Marital Status: Married    Spouse Name: [name]  . Number of Children: 3  . Years of Education: 2y coll   Occupational History  .      not working at this time   Social History Main Topics  . Smoking status: Former Smoker -- 1.00 packs/day for 15 years    Types:  Cigarettes    Quit date: 06/26/2014  . Smokeless tobacco: Never Used  . Alcohol Use: No  . Drug Use: No  . Sexual Activity: Not Asked   Other Topics Concern  . None   Social History Narrative   Caffeine.3 cups coffee, daily.   History reviewed. No pertinent past surgical history. Past Medical History  Diagnosis Date  . Stroke ([place])   . Hyperlipidemia    BP 124/83 mmHg  Pulse 60  Resp 14  SpO2 98%  Opioid Risk Score:   Fall Risk Score:  `1  Depression screen PHQ 2/9  Depression screen [facility] 2/9 03/15/2015 01/25/2015 10/05/2014  Decreased Interest 0 0 1  Down, Depressed, Hopeless 0 0 0  PHQ - 2 Score 0 0 1  Altered sleeping - - 0  Tired, decreased energy - - 0  Change in appetite - - 0  Feeling bad or failure about yourself  - - 0  Trouble concentrating - - 0  Moving slowly or fidgety/restless - - 1  Suicidal thoughts - - 0  PHQ-9 Score - - 2     Review of Systems  Constitutional: Positive for unexpected weight change.  Musculoskeletal: Positive for gait problem.  All other systems reviewed and are negative.      Objective:   Physical Exam  Constitutional: He is oriented to person, place, and time. He appears well-developed and well-nourished.  HENT:  Head: Normocephalic  and atraumatic.  Eyes: Conjunctivae and EOM are normal. Pupils are equal, round, and reactive to light.  Neck: Normal range of motion.  Musculoskeletal:       Left shoulder: He exhibits decreased range of motion and pain. He exhibits no tenderness, no swelling, no effusion, no crepitus and no deformity.  Patient has limited range of motion with external rotation left shoulder. Negative impingement sign No hypersensitivity to touch over the left shoulder.  Neurological: He is alert and oriented to person, place, and time. Coordination abnormal.  Motor strength is 4/5 in the left deltoid, biceps, triceps, grip 5/5 on the right side.  Decreased fine motor left hand  Increased tone in left  pectoralis particularly sternal aspect  Psychiatric: He has a normal mood and affect.  Nursing note and vitals reviewed.         Assessment & Plan:  1. Hemiplegic shoulder pain left side multifactorial, he has elements of spasticity in the pectoralis as well as adhesive capsulitis. We will repeat Botox 100 units pectoralis. This would be a higher dose to the pectoralis than in July 2016 150 units was injected. No injection needed to the left biceps.  Too early for repeat glenohumeral injection, we will repeat after the Botox

## 2015-06-07 NOTE — Patient Instructions (Signed)
Patient will need a repeat Botox injection to the pectoralis muscle which connects between the chest in the shoulder on the left side. Too early to do another cortisone injection. We will need to wait until February

## 2015-06-10 ENCOUNTER — Ambulatory Visit: Payer: BLUE CROSS/BLUE SHIELD | Admitting: Physical Medicine & Rehabilitation

## 2015-07-02 ENCOUNTER — Encounter: Payer: Self-pay | Admitting: Physical Medicine & Rehabilitation

## 2015-07-02 ENCOUNTER — Ambulatory Visit (HOSPITAL_BASED_OUTPATIENT_CLINIC_OR_DEPARTMENT_OTHER): Payer: Medicaid Other | Admitting: Physical Medicine & Rehabilitation

## 2015-07-02 VITALS — BP 113/79 | HR 69 | Resp 14

## 2015-07-02 DIAGNOSIS — G8110 Spastic hemiplegia affecting unspecified side: Secondary | ICD-10-CM | POA: Diagnosis not present

## 2015-07-02 NOTE — Progress Notes (Signed)
Botox Injection for spasticity using needle EMG guidance  Dilution: 50 Units/ml Indication: Severe spasticity which interferes with ADL,mobility and/or  hygiene and is unresponsive to medication management and other conservative care Informed consent was obtained after describing risks and benefits of the procedure with the patient. This includes bleeding, bruising, infection, excessive weakness, or medication side effects. A REMS form is on file and signed. Needle: 27g 1" needle electrode Number of units per muscle Pectoralis100  All injections were done after obtaining appropriate EMG activity and after negative drawback for blood. The patient tolerated the procedure well. Post procedure instructions were given. A followup appointment was made.  

## 2015-07-02 NOTE — Patient Instructions (Signed)

## 2015-07-14 ENCOUNTER — Other Ambulatory Visit: Payer: Self-pay | Admitting: Physical Medicine & Rehabilitation

## 2015-07-26 ENCOUNTER — Emergency Department (HOSPITAL_COMMUNITY)
Admission: EM | Admit: 2015-07-26 | Discharge: 2015-07-26 | Disposition: A | Discharge status: Home / Self Care | Payer: Medicaid Other | Source: Home / Self Care | Attending: Family Medicine | Admitting: Family Medicine

## 2015-07-26 ENCOUNTER — Encounter (HOSPITAL_COMMUNITY): Payer: Self-pay | Admitting: *Deleted

## 2015-07-26 DIAGNOSIS — I10 Essential (primary) hypertension: Secondary | ICD-10-CM

## 2015-07-26 NOTE — ED Notes (Signed)
Pt  Reports  Has     History  Of   High   bp  About  6   Pm           History  Of  Stroke  In  Past             l   Arm        shakey  Earlier            Better

## 2015-07-26 NOTE — Discharge Instructions (Signed)
Continue your medicines, activity as tolerated, see your doctor as needed, your bp tonight was 124/93.

## 2015-07-26 NOTE — ED Provider Notes (Signed)
CSN: [id number]     Arrival date & time 07/26/15  1907 History   First MD Initiated Contact with Patient 07/26/15 1928     Chief Complaint  Patient presents with  . Hypertension   (Consider location/radiation/quality/duration/timing/severity/associated sxs/prior Treatment) Patient is a 55 y.o. male presenting with hypertension. The history is provided by the patient and the spouse.  Hypertension This is a chronic problem. The current episode started 1 to 2 hours ago (wife desires eval of bp and stability .). The problem has been gradually improving. Associated symptoms include headaches. Pertinent negatives include no chest pain and no abdominal pain. Associated symptoms comments: Headache improving to level 1 at this time. No dizziness, no change in left sided weakness..    Past Medical History  Diagnosis Date  . Stroke ([place])   . Hyperlipidemia    History reviewed. No pertinent past surgical history. Family History  Problem Relation Age of Onset  . Diabetes Mother    Social History  Substance Use Topics  . Smoking status: Former Smoker -- 1.00 packs/day for 15 years    Types: Cigarettes    Quit date: 06/26/2014  . Smokeless tobacco: Never Used  . Alcohol Use: No    Review of Systems  Respiratory: Negative.   Cardiovascular: Negative.  Negative for chest pain.  Gastrointestinal: Negative for abdominal pain.  Neurological: Positive for facial asymmetry, weakness and headaches. Negative for dizziness, seizures, speech difficulty and light-headedness.  All other systems reviewed and are negative.   Allergies  Review of patient's allergies indicates no known allergies.  Home Medications   Prior to Admission medications   Medication Sig Start Date End Date Taking? Authorizing Provider  acetaminophen (TYLENOL) 325 MG tablet Take 2 tablets (650 mg total) by mouth every 6 (six) hours as needed for mild pain or headache. 07/08/14   Albertine Patricia, MD  aspirin 325 MG tablet  Take 1 tablet (325 mg total) by mouth daily. 07/29/14   Lavon Paganini Angiulli, PA-C  atorvastatin (LIPITOR) 20 MG tablet Take 1 tablet (20 mg total) by mouth daily at 6 PM. 07/29/14   Lavon Paganini Angiulli, PA-C  donepezil (ARICEPT) 5 MG tablet Take 5 mg by mouth at bedtime. 04/09/15   Historical Provider, MD  fexofenadine (ALLEGRA) 30 MG tablet Take 30 mg by mouth 2 (two) times daily.    Historical Provider, MD  folic acid (FOLVITE) [id number] MCG tablet Take 1 tablet (400 mcg total) by mouth daily. 07/29/14   Lavon Paganini Angiulli, PA-C  gabapentin (NEURONTIN) 300 MG capsule take 1 capsule BY MOUTH THREE TIMES DAILY 07/14/15   Charlett Blake, MD  Thiamine HCl (VITAMIN B-1) 250 MG tablet Take 250 mg by mouth daily.    Historical Provider, MD  tiZANidine (ZANAFLEX) 4 MG tablet Take 1 tablet (4 mg total) by mouth 3 (three) times daily. 03/24/15   Charlett Blake, MD  traMADol (ULTRAM) 50 MG tablet TAKE 1 TABLET BY MOUTH TWICE DAILY 03/30/15   Charlett Blake, MD  traZODone (DESYREL) 50 MG tablet Take 50 mg by mouth at bedtime. 03/04/15   Historical Provider, MD   Meds Ordered and Administered this Visit  Medications - No data to display  BP 124/93 mmHg  Pulse 63  Temp(Src) 98.2 F (36.8 C) (Oral)  Resp 16  SpO2 98% No data found.   Physical Exam  Constitutional: He is oriented to person, place, and time. He appears well-developed and well-nourished. No distress.  HENT:  Head: Normocephalic.  Mouth/Throat: Oropharynx is clear and moist.  Eyes: Pupils are equal, round, and reactive to light.  Cardiovascular: Normal heart sounds.   Pulmonary/Chest: Effort normal and breath sounds normal. No respiratory distress.  Musculoskeletal:       Arms: Neurological: He is alert and oriented to person, place, and time. A cranial nerve deficit is present.  Skin: Skin is warm and dry.  Nursing note and vitals reviewed.   ED Course  Procedures (including critical care time)  Labs Review Labs Reviewed - No data  to display  Imaging Review No results found.   Visual Acuity Review  Right Eye Distance:   Left Eye Distance:   Bilateral Distance:    Right Eye Near:   Left Eye Near:    Bilateral Near:         MDM   1. Essential hypertension    Pt and wife comfortable with d/c home. No acute process.    Billy Fischer, MD 07/26/15 Despina Pole

## 2015-08-11 ENCOUNTER — Ambulatory Visit (INDEPENDENT_AMBULATORY_CARE_PROVIDER_SITE_OTHER): Payer: Self-pay | Admitting: Neurology

## 2015-08-11 ENCOUNTER — Encounter: Payer: Self-pay | Admitting: Neurology

## 2015-08-11 ENCOUNTER — Telehealth: Payer: Self-pay | Admitting: *Deleted

## 2015-08-11 VITALS — BP 126/85 | HR 55 | Ht 74.0 in | Wt 179.2 lb

## 2015-08-11 DIAGNOSIS — I63231 Cerebral infarction due to unspecified occlusion or stenosis of right carotid arteries: Secondary | ICD-10-CM | POA: Insufficient documentation

## 2015-08-11 DIAGNOSIS — I10 Essential (primary) hypertension: Secondary | ICD-10-CM

## 2015-08-11 DIAGNOSIS — E785 Hyperlipidemia, unspecified: Secondary | ICD-10-CM

## 2015-08-11 DIAGNOSIS — I63131 Cerebral infarction due to embolism of right carotid artery: Secondary | ICD-10-CM

## 2015-08-11 NOTE — Progress Notes (Signed)
STROKE NEUROLOGY FOLLOW UP NOTE  NAME: Samuel Shaw DOB: Mar 27, 1961  REASON FOR VISIT: stroke follow up HISTORY FROM: wife and chart  Today we had the pleasure of seeing Samuel Shaw in follow-up at our Neurology Clinic. Pt was accompanied by no one.   History Summary Samuel Shaw is an 55 y.o. male PMH of 2 TIAs 2 years ago, right carotid stenosis but no intervention, and current smoker was admitted on 07/06/14 for subacute CVA in late 06/2014 in [place] with progressive left-sided weakness since the discharge. These symptoms are similar to his stroke in December, the family just feels they are getting worse. A review of records from [place] are pertinent for a head CT showing an acute right basal ganglia infarct and he is on ASA 325mg  daily. MRI showed multifocal subacute to remote infarct at right MCA territory (no new infarct) and CTA neck showed right ICA occlusion, athero vs. Dissection. He was continued on discharged to [facility] .   Follow up 10/08/14 - the patient has been doing well. Discharged from [facility] on 07/29/14 and currently outpt PT/OT, 2 times per week. He made significant progress from rehab. Currently he is walking with cane and steady. His BP is 118/85 today, he is still on ASA and lipitor. He has quit smoking and on nicotine patch now.  02/08/15 follow up - pt has been doing well. Quit smoking completely. Had CTA head and neck did not show right ICA recannulization. TCD emboli detection negative and VMR showed bilateral changes. Has finished off PT/OT and also stopped BP meds due to low BP. Today 131/98 in clinic. He is not doing much exercise at home.   Interval History During the interval time, pt has been doing the same. Has short memory loss and was put on Aricept by PCP. BP 126/85. No recurrent stroke like symptoms. Still has left sided mild weakness, doing home exercise.   REVIEW OF SYSTEMS: Full 14 system review of systems performed and notable only for those listed  below and in HPI above, all others are negative:  Constitutional:  Activity change Cardiovascular:  Ear/Nose/Throat:   Skin:  Eyes:   Respiratory:   Gastroitestinal:   Genitourinary:  Hematology/Lymphatic:   Endocrine:  Musculoskeletal:   Allergy/Immunology:   Neurological:  Psychiatric:  Sleep:   The following represents the patient's updated allergies and side effects list: No Known Allergies  The neurologically relevant items on the patient's problem list were reviewed on today's visit.  Neurologic Examination  A problem focused neurological exam (12 or more points of the single system neurologic examination, vital signs counts as 1 point, cranial nerves count for 8 points) was performed.  Blood pressure 126/85, pulse 55, height 6\' 2"  (1.88 m), weight 179 lb 3.2 oz (81.285 kg).  General - Well nourished, well developed, in no apparent distress.  Ophthalmologic - Sharp disc margins OU.  Cardiovascular - Regular rate and rhythm with no murmur.  Mental Status -  Level of arousal and orientation to time, place, and person were intact. Language including expression, naming, repetition, comprehension was assessed and found intact.  Cranial Nerves II - XII - II - Visual field intact OU. III, IV, VI - Extraocular movements intact. V - Facial sensation intact bilaterally. VII - mild left facial droop. VIII - Hearing & vestibular intact bilaterally. X - Palate elevates symmetrically. XI - Chin turning & shoulder shrug intact bilaterally. XII - Tongue protrusion intact.  Motor Strength - The patient's strength was  LUE 5-/5 without pronator drift, LLE 4/5 proximal and 4+/5 distally.  Bulk was normal and fasciculations were absent.   Motor Tone - Muscle tone was assessed at the neck and appendages and was normal.  Reflexes - The patient's reflexes were normal in all extremities and he had no pathological reflexes.  Sensory - Light touch, temperature/pinprick were assessed  and were normal.    Coordination - The patient had normal movements in the hands and feet with no ataxia or dysmetria.  Tremor was absent.  Gait and Station - walk with cane and hemiparetic gait on the left.  Data reviewed: I personally reviewed the images and agree with the radiology interpretations.  Dg Chest 2 View 07/06/2014 CLINICAL DATA: Increasing left-sided weakness. Previous stroke. Smoker. EXAM: CHEST 2 VIEW COMPARISON: 10/31/2008. FINDINGS: Normal sized heart. Clear lungs. Increased density overlying the posterior aspect of the lower thoracic spine. IMPRESSION: Possible mass overlying the posterior aspect of the lower thoracic spine. A chest CT with contrast is recommended. Electronically Signed By: Enrique Sack M.D.  On: 07/06/2014 15:54   Ct Head Wo Contrast 07/06/2014 No acute finding. Findings consistent with subacute to remote right MCA territory infarct. Chronic microvascular ischemic change also noted.   Mr Brain Wo Contrast 07/06/2014 Acute multifocal infarcts within RIGHT middle cerebral artery territory (including RIGHT basal ganglia). RIGHT anterior occipital likely within RIGHT middle cerebral artery territory/watershed. Limited blood sensitive sequence without lobar hematoma. Mild white matter changes, independent of acute areas of ischemia suggest chronic small vessel ischemic disease.   Mr Jodene Nam Head/brain Wo Cm 07/06/2014 RIGHT internal carotid artery occlusion. Bilateral posterior communicating arteries and suspected tiny anterior communicating artery present. Diminutive RIGHT middle cerebral artery flow related enhancement. Recommend CT angiogram of the neck for further evaluation.   CT angio neck 07/07/2014 Right internal carotid artery is occluded 1 cm above its origin. Etiology indeterminate. Focal plaque proximal left internal carotid artery without measurable/significant stenosis. Mild narrowing proximal right vertebral artery. Slightly ectatic  ascending thoracic aorta (3.4 cm). Surrounding incidental findings.  2D echo - Left ventricle: The cavity size was normal. Systolic function was normal. The estimated ejection fraction was in the range of 55% to 60%. Wall motion was normal; there were no regional wall motion abnormalities.  CTA head and neck 10/20/14 - Complete occlusion RIGHT internal carotid artery 1 cm above its origin. This likely relates either to dissection or focal atheromatous plaque. Reconstitution of the RIGHT anterior circulation intracranially via collateral flow No acute findings. Chronic RIGHT hemisphere MCA territory infarction.  TCD MES -  Negative for 96min  TCD VMR - decreased reserve bilaterally  Component     Latest Ref Rng 07/07/2014  Cholesterol     0 - 200 mg/dL 182  Triglycerides     <150 mg/dL 116  HDL     >39 mg/dL 24 (L)  Total CHOL/HDL Ratio      7.6  VLDL     0 - 40 mg/dL 23  LDL (calc)     0 - 99 mg/dL 135 (H)  Hemoglobin A1C     <5.7 % 5.6  Mean Plasma Glucose     <117 mg/dL 114  TSH     0.350 - 4.500 uIU/mL 0.473    Assessment: As you may recall, he is a 55 y.o. African American male with PMH of TIA, right carotid stenosis without intervention, smoker was admitted due to right MCA territory stroke. Found to have right ICA occlusion. He seems to have TIAs  in 2010 and found to have right ICA stenosis at that time but no intervention done. His ICA occlusion likely due to athero, but dissection is also possibility as left ICA was smooth. Repeat CTA head and neck showed no recannulization. TCD MES negative. Has quit smoking. Stable mild left sided hemiparesis. BP stable off BP meds.  Plan:  - continue ASA and lipitor for stroke prevention - check BP at home, BP goal 120-140. Avoid low BP due to your right carotid artery occlusion - Follow up with your primary care physician for stroke risk factor modification. Recommend maintain blood pressure goal 120-140, diabetes with  hemoglobin A1c goal below 6.5% and lipids with LDL cholesterol goal below 70 mg/dL.  - continue home exercise for left side mild weakness. - follow up as needed.   No orders of the defined types were placed in this encounter.    No orders of the defined types were placed in this encounter.    Patient Instructions  - continue ASA and lipitor for stroke prevention - check BP at home, BP goal 120-140. Avoid low BP due to your right carotid artery occlusion - Follow up with your primary care physician for stroke risk factor modification. Recommend maintain blood pressure goal 120-140, diabetes with hemoglobin A1c goal below 6.5% and lipids with LDL cholesterol goal below 70 mg/dL.  - continue home exercise for left side mild weakness. - follow up as needed.   Rosalin Hawking, MD PhD [facility] [address], [place], [place] [address] [phone]

## 2015-08-11 NOTE — Telephone Encounter (Signed)
Dr Erlinda Hong saw patient for office to discuss blood pressure and other follow up questions.

## 2015-08-11 NOTE — Patient Instructions (Signed)
-   continue ASA and lipitor for stroke prevention - check BP at home, BP goal 120-140. Avoid low BP due to your right carotid artery occlusion - Follow up with your primary care physician for stroke risk factor modification. Recommend maintain blood pressure goal 120-140, diabetes with hemoglobin A1c goal below 6.5% and lipids with LDL cholesterol goal below 70 mg/dL.  - continue home exercise for left side mild weakness. - follow up as needed.

## 2015-08-13 ENCOUNTER — Encounter: Payer: Self-pay | Admitting: Physical Medicine & Rehabilitation

## 2015-08-13 ENCOUNTER — Encounter: Payer: Medicaid Other | Attending: Physical Medicine & Rehabilitation

## 2015-08-13 ENCOUNTER — Ambulatory Visit (HOSPITAL_BASED_OUTPATIENT_CLINIC_OR_DEPARTMENT_OTHER): Payer: Medicaid Other | Admitting: Physical Medicine & Rehabilitation

## 2015-08-13 VITALS — BP 131/70 | HR 49 | Resp 14

## 2015-08-13 DIAGNOSIS — I63511 Cerebral infarction due to unspecified occlusion or stenosis of right middle cerebral artery: Secondary | ICD-10-CM | POA: Diagnosis not present

## 2015-08-13 DIAGNOSIS — G8110 Spastic hemiplegia affecting unspecified side: Secondary | ICD-10-CM | POA: Insufficient documentation

## 2015-08-13 DIAGNOSIS — R414 Neurologic neglect syndrome: Secondary | ICD-10-CM | POA: Diagnosis not present

## 2015-08-13 DIAGNOSIS — I6931 Attention and concentration deficit following cerebral infarction: Secondary | ICD-10-CM | POA: Insufficient documentation

## 2015-08-13 DIAGNOSIS — M7502 Adhesive capsulitis of left shoulder: Secondary | ICD-10-CM | POA: Diagnosis not present

## 2015-08-13 NOTE — Progress Notes (Signed)
Subjective:    Patient ID: Samuel Shaw, male    DOB: 16-Aug-1960, 55 y.o.   MRN: [id number]  HPI  Continues to have left shoulder pain. History of right MCA infarct with left hemiparesis in January 2016. He has completed inpatient therapy as well as outpatient therapy  Botox 100 units to the pectoralis was performed 07/02/2015. Patient has had some improvement in his left shoulder pain as well as mobility but he felt like the glenohumeral injection was more effective for this.  Last glenohumeral injection was performed 05/13/2015. Pain Inventory Average Pain 0 Pain Right Now 0 My pain is aching  In the last 24 hours, has pain interfered with the following? General activity NA Relation with others 5 Enjoyment of life 6 What TIME of day is your pain at its worst? night Sleep (in general) NA  Pain is worse with: inactivity and some activites Pain improves with: rest Relief from Meds: NA  Mobility use a cane ability to climb steps?  yes do you drive?  yes use a wheelchair needs help with transfers  Function Do you have any goals in this area?  yes  Neuro/Psych weakness  Prior Studies Any changes since last visit?  no  Physicians involved in your care Any changes since last visit?  no   Family History  Problem Relation Age of Onset  . Diabetes Mother    Social History   Social History  . Marital Status: Married    Spouse Name: Samuel Shaw  . Number of Children: 3  . Years of Education: 2y coll   Occupational History  .      not working at this time   Social History Main Topics  . Smoking status: Former Smoker -- 1.00 packs/day for 15 years    Types: Cigarettes    Quit date: 06/26/2014  . Smokeless tobacco: Never Used  . Alcohol Use: No  . Drug Use: No  . Sexual Activity: Not Asked   Other Topics Concern  . None   Social History Narrative   Caffeine.3 cups coffee, daily.   History reviewed. No pertinent past surgical history. Past Medical  History  Diagnosis Date  . Stroke ([place])   . Hyperlipidemia   . Hypertension    BP 131/70 mmHg  Pulse 49  Resp 14  SpO2 98%  Opioid Risk Score:   Fall Risk Score:  `1  Depression screen PHQ 2/9  Depression screen [facility] 2/9 03/15/2015 01/25/2015 10/05/2014  Decreased Interest 0 0 1  Down, Depressed, Hopeless 0 0 0  PHQ - 2 Score 0 0 1  Altered sleeping - - 0  Tired, decreased energy - - 0  Change in appetite - - 0  Feeling bad or failure about yourself  - - 0  Trouble concentrating - - 0  Moving slowly or fidgety/restless - - 1  Suicidal thoughts - - 0  PHQ-9 Score - - 2      Review of Systems  Neurological: Positive for weakness.  All other systems reviewed and are negative.      Objective:   Physical Exam  Constitutional: He is oriented to person, place, and time. He appears well-developed and well-nourished.  HENT:  Head: Normocephalic and atraumatic.  Eyes: Conjunctivae and EOM are normal. Pupils are equal, round, and reactive to light.  Neurological: He is alert and oriented to person, place, and time.  Psychiatric: He has a normal mood and affect.  Nursing note and vitals reviewed.  Abduction limited  to 80. Pain at end range. Patient has a prominent clavicular portion of the pectoralis on the left side. He has limited horizontal abduction      Assessment & Plan:  1. Adhesive capsulitis post stroke left shoulder. Has some spasticity that contributes to this. Good relief with glenohumeral injection. Last one performed 3 months ago, will repeat today.  In terms of his botulinum toxin to the pectoralis would increase to 200 units, may repeat in 6 weeks  Shoulder injection Left glenohumeral Without ultrasound guidance)  Indication:Left adhesive capsulitis with Shoulder pain not relieved by medication management and other conservative care.  Informed consent was obtained after describing risks and benefits of the procedure with the patient, this includes  bleeding, bruising, infection and medication side effects. The patient wishes to proceed and has given written consent. Patient was placed in a seated position. The Left shoulder was marked and prepped with betadine in the subacromial area. A 25-gauge 1-1/2 inch needle was inserted into the subacromial area. After negative draw back for blood, a solution containing 1 mL of 6 mg per ML betamethasone and 4 mL of 1% lidocaine was injected. A band aid was applied. The patient tolerated the procedure well. Post procedure instructions were given.

## 2015-08-13 NOTE — Patient Instructions (Addendum)
We did a left glenohumeral injection with lidocaine and Cel[place] today. This can be repeated in 3 months as needed.  Botox 200 Units to pectoralis in 6 wks

## 2015-08-29 IMAGING — CT CT ANGIO HEAD
1 of 10 series · 5 of 33 positions shown · IV contrast (75CC ISOVUE 370)
Comparison: MR head 07/06/2014.  CTA  neck 07/07/2014.

CLINICAL DATA: History of CVA June 2014. Evaluate for RIGHT ICA
occlusion. LEFT-sided weakness.

EXAM:
CT ANGIOGRAPHY HEAD AND NECK
TECHNIQUE: Multidetector CT imaging of the head and neck was performed using
the standard protocol during bolus administration of intravenous
contrast. Multiplanar CT image reconstructions and MIPs were
obtained to evaluate the vascular anatomy. Carotid stenosis
measurements (when applicable) are obtained utilizing NASCET
criteria, using the distal internal carotid diameter as the
denominator.
CONTRAST:  Isovue 370, 75 mL.

[Series 501: axial thin · axial · 0.54mm/px · z∈[+61,+317]mm · 5 of 384 slices shown]
[im 64/384  soft-tissue]
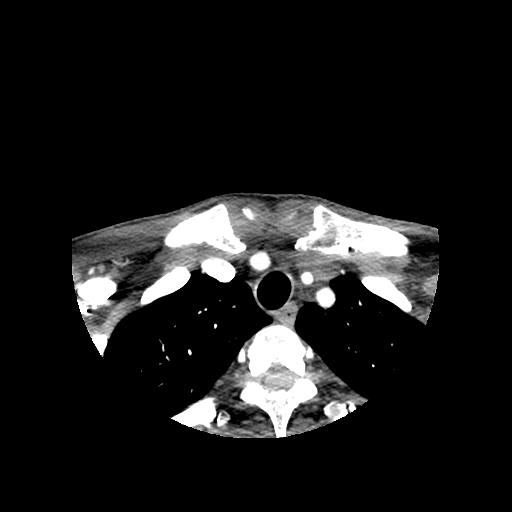
[im 128/384  bone]
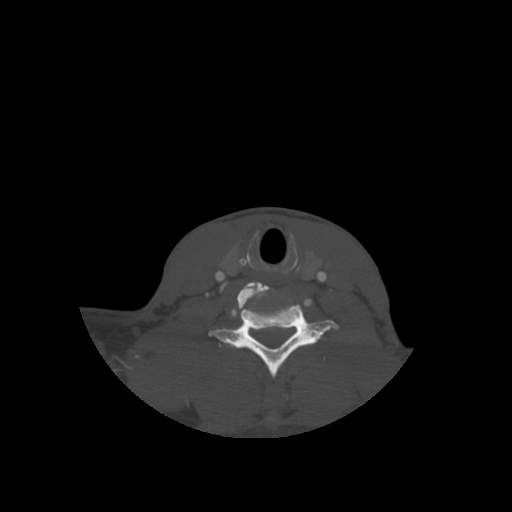
[im 192/384  soft-tissue]
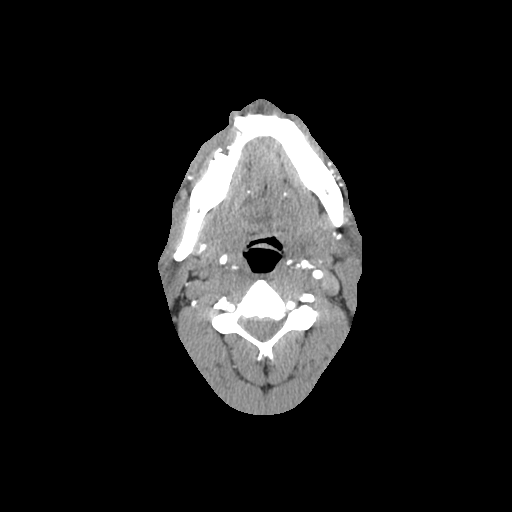
[im 256/384  bone]
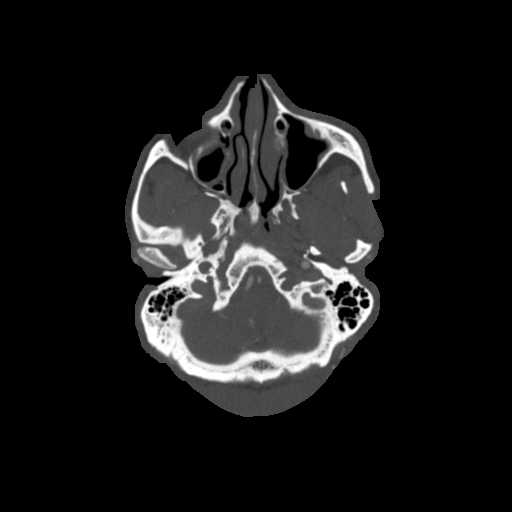
[im 320/384  soft-tissue]
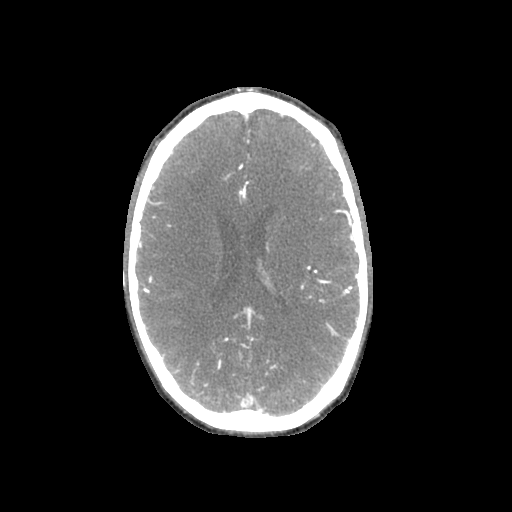

[5 of 33 positions shown; findings below may reference images not displayed]

FINDINGS: CT HEAD

Calvarium and skull base: No fracture or destructive lesion.
Mastoids and middle ears are grossly clear.

Paranasal sinuses: Imaged portions are clear.

Orbits: Negative.

Brain: No evidence of acute abnormality, including acute infarct,
hemorrhage, hydrocephalus, or mass lesion. Chronic cerebral
infarction involving the RIGHT MCA territory primarily, including
the RIGHT frontal cortex and subcortical white matter, RIGHT basal
ganglia and posterior limb internal capsule.

CTA NECK

Aortic arch: Standard branching. Imaged portion shows no evidence of
aneurysm or dissection. Mild ectasia. No significant stenosis of the
major arch vessel origins.

Right carotid system: The RIGHT ICA is occluded approximately 1 cm
above its origin. This may relate to focal dissection or severe
atheromatous plaque. No significant reconstitution in the neck.

Left carotid system: No evidence of dissection, stenosis (50% or
greater) or occlusion.

Vertebral arteries: Codominant. No evidence of dissection, stenosis
(50% or greater) or occlusion.

Nonvascular soft tissues: Calcified mediastinal lymph nodes suggest
prior granulomatous exposure. Unchanged small thyroid lesions,
greater on the LEFT measuring up to 1.2 cm. Periodontal disease.
Spondylosis. Mild paranasal sinus disease.

CTA HEAD

Anterior circulation: Minor irregularity LEFT carotid siphon and
supraclinoid ICA. No significant left-sided stenosis, proximal
occlusion, aneurysm, or vascular malformation. The RIGHT cavernous
and supraclinoid ICA is reconstituted via collateral flow from
multiple sources, including likely retrograde external via the
ophthalmic, posterior circulation via the RIGHT PCom, and LEFT to
RIGHT anterior circulation via ACom. Generalized diminutive M1, A1,
and distal anterior circulation branches on the RIGHT without focal
occlusion.

Posterior circulation: No significant stenosis, proximal occlusion,
aneurysm, or vascular malformation.

Venous sinuses: As permitted by contrast timing, patent.

Anatomic variants: None of significance.

Delayed phase:   No abnormal intracranial enhancement.
IMPRESSION: Complete occlusion RIGHT internal carotid artery 1 cm above its
origin. This likely relates either to dissection or focal
atheromatous plaque. Reconstitution of the RIGHT anterior
circulation intracranially via collateral flow

No acute findings. Chronic RIGHT hemisphere MCA territory
infarction.

## 2015-09-18 ENCOUNTER — Other Ambulatory Visit: Payer: Self-pay | Admitting: Physical Medicine & Rehabilitation

## 2015-09-24 ENCOUNTER — Encounter: Payer: Medicaid Other | Attending: Physical Medicine & Rehabilitation

## 2015-09-24 ENCOUNTER — Ambulatory Visit (HOSPITAL_BASED_OUTPATIENT_CLINIC_OR_DEPARTMENT_OTHER): Payer: Medicaid Other | Admitting: Physical Medicine & Rehabilitation

## 2015-09-24 ENCOUNTER — Encounter: Payer: Self-pay | Admitting: Physical Medicine & Rehabilitation

## 2015-09-24 VITALS — BP 136/92 | HR 55 | Resp 14

## 2015-09-24 DIAGNOSIS — I63511 Cerebral infarction due to unspecified occlusion or stenosis of right middle cerebral artery: Secondary | ICD-10-CM | POA: Insufficient documentation

## 2015-09-24 DIAGNOSIS — G8110 Spastic hemiplegia affecting unspecified side: Secondary | ICD-10-CM | POA: Insufficient documentation

## 2015-09-24 DIAGNOSIS — R414 Neurologic neglect syndrome: Secondary | ICD-10-CM | POA: Insufficient documentation

## 2015-09-24 DIAGNOSIS — I6931 Attention and concentration deficit following cerebral infarction: Secondary | ICD-10-CM | POA: Diagnosis not present

## 2015-09-24 NOTE — Progress Notes (Signed)
Botox Injection for spasticity using needle EMG guidance  Dilution: 50 Units/ml Indication: Severe spasticity which interferes with ADL,mobility and/or  hygiene and is unresponsive to medication management and other conservative care Informed consent was obtained after describing risks and benefits of the procedure with the patient. This includes bleeding, bruising, infection, excessive weakness, or medication side effects. A REMS form is on file and signed. Needle: 27 g 1" needle electrode Number of units per muscle Pectoralis150 Biceps50  All injections were done after obtaining appropriate EMG activity and after negative drawback for blood. The patient tolerated the procedure well. Post procedure instructions were given. A followup appointment was made.  

## 2015-09-24 NOTE — Patient Instructions (Signed)

## 2015-10-25 ENCOUNTER — Other Ambulatory Visit: Payer: Self-pay | Admitting: Physical Medicine & Rehabilitation

## 2015-11-05 ENCOUNTER — Encounter: Payer: Medicaid Other | Attending: Physical Medicine & Rehabilitation

## 2015-11-05 ENCOUNTER — Ambulatory Visit (HOSPITAL_BASED_OUTPATIENT_CLINIC_OR_DEPARTMENT_OTHER): Payer: Medicaid Other | Admitting: Physical Medicine & Rehabilitation

## 2015-11-05 ENCOUNTER — Encounter: Payer: Self-pay | Admitting: Physical Medicine & Rehabilitation

## 2015-11-05 VITALS — BP 121/79 | HR 45 | Resp 15

## 2015-11-05 DIAGNOSIS — R414 Neurologic neglect syndrome: Secondary | ICD-10-CM | POA: Diagnosis not present

## 2015-11-05 DIAGNOSIS — G8110 Spastic hemiplegia affecting unspecified side: Secondary | ICD-10-CM

## 2015-11-05 DIAGNOSIS — I6931 Attention and concentration deficit following cerebral infarction: Secondary | ICD-10-CM | POA: Diagnosis not present

## 2015-11-05 DIAGNOSIS — I69919 Unspecified symptoms and signs involving cognitive functions following unspecified cerebrovascular disease: Secondary | ICD-10-CM

## 2015-11-05 DIAGNOSIS — I63511 Cerebral infarction due to unspecified occlusion or stenosis of right middle cerebral artery: Secondary | ICD-10-CM | POA: Insufficient documentation

## 2015-11-05 DIAGNOSIS — I69319 Unspecified symptoms and signs involving cognitive functions following cerebral infarction: Secondary | ICD-10-CM

## 2015-11-05 NOTE — Progress Notes (Signed)
Subjective:    Patient ID: Samuel Shaw, male    DOB: [birth year], 55 y.o.   MRN: [id number]  HPI Patient states that the left upper extremity feels looser after botulin toxin injection to the left biceps and pectoralis. Still has some problem supinating the left hand. Wife notes cognitive issues, he forgets to take his medications, forgets other things that she tells him. He does remember a lot about basketball games and players he does follow sport pretty closely. He is not working. He is on disability. We discussed that at one and a half years post stroke he is likely at a cognitive plateau. We did discuss that some of his medicines can cause some memory issues in particular tizanidine, tramadol, gabapentin. Wife also states that patient snores very heavily sometimes he stops breathing he has never been treated for sleep apnea. Patient has neuro follow-up in about 1 month Pain Inventory Average Pain 0 Pain Right Now 0 My pain is NA  In the last 24 hours, has pain interfered with the following? General activity NA Relation with others NA Enjoyment of life NA What TIME of day is your pain at its worst? NA Sleep (in general) NA  Pain is worse with: NA Pain improves with: NA Relief from Meds: NA  Mobility use a cane ability to climb steps?  yes do you drive?  yes  Function I need assistance with the following:  dressing, household duties and shopping  Neuro/Psych tremor spasms  Prior Studies Any changes since last visit?  no  Physicians involved in your care Any changes since last visit?  no   Family History  Problem Relation Age of Onset  . Diabetes Mother    Social History   Social History  . Marital Status: Married    Spouse Name: [name]  . Number of Children: 3  . Years of Education: 2y coll   Occupational History  .      not working at this time   Social History Main Topics  . Smoking status: Former Smoker -- 1.00 packs/day for 15 years   Types: Cigarettes    Quit date: 06/26/2014  . Smokeless tobacco: Never Used  . Alcohol Use: No  . Drug Use: No  . Sexual Activity: Not Asked   Other Topics Concern  . None   Social History Narrative   Caffeine.3 cups coffee, daily.   History reviewed. No pertinent past surgical history. Past Medical History  Diagnosis Date  . Stroke ([place])   . Hyperlipidemia   . Hypertension    BP 121/79 mmHg  Pulse 45  Resp 15  SpO2 98%  Opioid Risk Score:   Fall Risk Score:  `1  Depression screen PHQ 2/9  Depression screen [facility] 2/9 03/15/2015 01/25/2015 10/05/2014  Decreased Interest 0 0 1  Down, Depressed, Hopeless 0 0 0  PHQ - 2 Score 0 0 1  Altered sleeping - - 0  Tired, decreased energy - - 0  Change in appetite - - 0  Feeling bad or failure about yourself  - - 0  Trouble concentrating - - 0  Moving slowly or fidgety/restless - - 1  Suicidal thoughts - - 0  PHQ-9 Score - - 2     Review of Systems  Neurological: Positive for tremors.       Spasms   All other systems reviewed and are negative.      Objective:   Physical Exam  Constitutional: He appears well-developed and well-nourished.  HENT:  Head: Normocephalic and atraumatic.  Eyes: Conjunctivae and EOM are normal. Pupils are equal, round, and reactive to light.  Neurological: He is alert. He displays no atrophy. Coordination abnormal.  Motor strength is 4 minus at the left grip bicep tricep 3Minus at theDeltoid Right side is 5/5  Ambulates with a cane  Decreased fine motor coordination in the left hand   Psychiatric: Thought content normal. His affect is blunt. His speech is delayed. He is withdrawn. Cognition and memory are impaired.  Nursing note and vitals reviewed.  Ashworth 1 at the biceps and pectoralis as well as finger flexors, Ashworth grade 2 at the pronator       Assessment & Plan:  1. 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 We discussed that he is a plateau from a functional cognitive standpoint. We discussed that his medications could cause some mental slowing or fatigue and given that his tone has improved quite a bit we will stop his tizanidine and observe. My hope would be that his tone stays well controlled and he may become less fatigued or less forgetful. If he has a marked increase in his muscle tone on the left side of his body after discontinuation have instructed wife to restart.  We also discussed that if he does well off the tizanidine we may consider weaning down on the gabapentin  2. Possible sleep apnea, wife gives a history of very loud snoring as well as times when the patient appears to stop breathing. I instructed her to discuss this with her neurologist and a sleep study may be in order.  We'll repeat Botox in 6 weeks Over half of the 25 min visit was spent counseling and coordinating care.

## 2015-11-05 NOTE — Patient Instructions (Addendum)
Stop tizanidine. If the spasms get much worse and your arm and legs start shaking a lot or get very tight you'll need to restart this.  We'll repeat the Botox next visit in about 6 weeks or so  Please ask Dr Erlinda Hong to about getting sleep checked out, Samuel Shaw may have a condition called sleep apnea

## 2015-11-09 ENCOUNTER — Telehealth: Payer: Self-pay | Admitting: Neurology

## 2015-11-09 DIAGNOSIS — G4733 Obstructive sleep apnea (adult) (pediatric): Secondary | ICD-10-CM

## 2015-11-09 NOTE — Telephone Encounter (Signed)
Pt's wife called back said when they were at Dr Letta Pate office 11/05/15 that Dr Erlinda Hong would need to order that test. This would be a new symptom. Can pt be seen or will he need a referral.

## 2015-11-09 NOTE — Telephone Encounter (Signed)
Pt left msg with answering service at 12:33. Operator called pt back, he is requesting a sleep apnea test. Pt advised clinic would need referral. Pt sts Dr Letta Pate discussed this with him, he will contact that provider. FYI

## 2015-11-10 DIAGNOSIS — G4733 Obstructive sleep apnea (adult) (pediatric): Secondary | ICD-10-CM | POA: Insufficient documentation

## 2015-11-10 NOTE — Telephone Encounter (Signed)
No problem. I can put in the sleepy referral order. He will be seen by Dr. Roxy Horseman or Dr. Rexene Alberts. He can follow with them also. Still follow up with me as needed  basis. Thanks.  Rosalin Hawking, MD PhD Stroke Neurology 11/10/2015 3:23 PM  Orders Placed This Encounter  Procedures  . Ambulatory referral to Sleep Studies    Referral Priority:  Routine    Referral Type:  Consultation    Referral Reason:  Specialty Services Required    Number of Visits Requested:  1

## 2015-11-10 NOTE — Addendum Note (Signed)
Addended by: Rosalin Hawking on: 11/10/2015 03:24 PM   Modules accepted: Orders

## 2015-11-11 ENCOUNTER — Telehealth: Payer: Self-pay | Admitting: Neurology

## 2015-11-11 NOTE — Telephone Encounter (Signed)
Thanks, Katharine Look. That is what I would say too.   Rosalin Hawking, MD PhD Stroke Neurology 11/11/2015 1:15 PM

## 2015-11-11 NOTE — Telephone Encounter (Addendum)
Pt's wife called, said she is worried said he is sweating. She said he never sweats. She took pt BP- 116/86. She said he said he felt like this when he had a stroke. She is very concerned. Operator advised to take him to ED, she said pt has already refused when she asked him earlier. Wife decided she is going to take him to ED.

## 2015-11-16 ENCOUNTER — Encounter: Payer: Self-pay | Admitting: Neurology

## 2015-11-16 ENCOUNTER — Ambulatory Visit (INDEPENDENT_AMBULATORY_CARE_PROVIDER_SITE_OTHER): Payer: Medicaid Other | Admitting: Neurology

## 2015-11-16 VITALS — BP 150/101 | HR 46 | Resp 16 | Ht 74.0 in | Wt 178.0 lb

## 2015-11-16 DIAGNOSIS — Z789 Other specified health status: Secondary | ICD-10-CM

## 2015-11-16 DIAGNOSIS — G4710 Hypersomnia, unspecified: Secondary | ICD-10-CM

## 2015-11-16 DIAGNOSIS — Z9189 Other specified personal risk factors, not elsewhere classified: Secondary | ICD-10-CM | POA: Diagnosis not present

## 2015-11-16 DIAGNOSIS — I63231 Cerebral infarction due to unspecified occlusion or stenosis of right carotid arteries: Secondary | ICD-10-CM | POA: Diagnosis not present

## 2015-11-16 DIAGNOSIS — G4733 Obstructive sleep apnea (adult) (pediatric): Secondary | ICD-10-CM

## 2015-11-16 NOTE — Telephone Encounter (Signed)
error 

## 2015-11-16 NOTE — Progress Notes (Signed)
Subjective:    Patient ID: Samuel Shaw is a 55 y.o. male.  HPI     Star Age, MD, PhD [facility] [address] [place], [place] [address]  Dear Cornelius Moras,   I saw your patient, Samuel Shaw, upon your kind request in my clinic today for initial consultation of his sleep disorder, in particular, concern for underlying obstructive sleep apnea. The patient is accompanied by his wife today. As you know, Samuel Shaw is a 55 year old right-handed gentleman with an underlying medical history of stroke in December 2015 (when in [place]), prior smoking, prior TIAs (per wife 2010), carotid artery occlusion, who reports snoring and excessive daytime somnolence. His wife has noted apneic pauses while he is asleep. He is sleepy during the day and takes several shorter naps during the day. His ESS is 15/24 today, FSS is 45/63.  His bedtime varies from 9 PM to MN and TV stays on all night. He wakes up around 6:30 AM. He was a heavy Company secretary.  He quit smoking after his stroke and does not drink alcohol, no more Marijuana. He drinks some coffee every other day or so but does drink a lot of Cola, not enough water.  He has three grown daughters and 2 grandchildren, the grandson lives with them.  He denies RLS symptoms and is not known to kick in his sleep. He has no morning headaches. He denies nocturia. He has no overt FHx of OSA, but mother snores loudly.   I reviewed your office note from 08/11/2015. He sees Dr. Letta Pate for botulinum toxin injection for left upper extremity spasticity poststroke.   His Past Medical History Is Significant For: Past Medical History  Diagnosis Date  . Stroke ([place])   . Hyperlipidemia   . Hypertension     His Past Surgical History Is Significant For: No past surgical history on file.  His Family History Is Significant For: Family History  Problem Relation Age of Onset  . Diabetes Mother     His Social History  Is Significant For: Social History   Social History  . Marital Status: Married    Spouse Name: [name]  . Number of Children: 3  . Years of Education: 2y coll   Occupational History  . Disabled      not working at this time   Social History Main Topics  . Smoking status: Former Smoker -- 1.00 packs/day for 15 years    Types: Cigarettes    Quit date: 06/26/2014  . Smokeless tobacco: Never Used  . Alcohol Use: No  . Drug Use: No  . Sexual Activity: Not Asked   Other Topics Concern  . None   Social History Narrative   Caffeine.1-2 drinks a day .    His Allergies Are:  No Known Allergies:   His Current Medications Are:  Outpatient Encounter Prescriptions as of 11/16/2015  Medication Sig  . acetaminophen (TYLENOL) 325 MG tablet Take 2 tablets (650 mg total) by mouth every 6 (six) hours as needed for mild pain or headache.  Marland Kitchen aspirin 325 MG tablet Take 1 tablet (325 mg total) by mouth daily.  Marland Kitchen atorvastatin (LIPITOR) 20 MG tablet Take 1 tablet (20 mg total) by mouth daily at 6 PM.  . donepezil (ARICEPT) 5 MG tablet Take 5 mg by mouth at bedtime.  . fexofenadine (ALLEGRA) 30 MG tablet Take 30 mg by mouth 2 (two) times daily.  . folic acid (FOLVITE) A999333 MCG tablet Take  1 tablet (400 mcg total) by mouth daily.  Marland Kitchen gabapentin (NEURONTIN) 300 MG capsule take 1 capsule BY MOUTH THREE TIMES DAILY  . Thiamine HCl (VITAMIN B-1) 250 MG tablet Take 250 mg by mouth daily.  . traMADol (ULTRAM) 50 MG tablet TAKE 1 TABLET BY MOUTH TWICE DAILY  . traZODone (DESYREL) 50 MG tablet Take 50 mg by mouth at bedtime.   No facility-administered encounter medications on file as of 11/16/2015.  :  Review of Systems:  Out of a complete 14 point review of systems, all are reviewed and negative with the exception of these symptoms as listed below:   Review of Systems  Neurological:       Patient has trouble staying asleep, snoring, witnessed apnea, takes naps during day. Wife reports that patient  patient is tired during the day    Epworth Sleepiness Scale 0= would never doze 1= slight chance of dozing 2= moderate chance of dozing 3= high chance of dozing  Sitting and reading:0 Watching TV:1 Sitting inactive in a public place (ex. Theater or meeting):3 As a passenger in a car for an hour without a break:3 Lying down to rest in the afternoon:2 Sitting and talking to someone:1 Sitting quietly after lunch (no alcohol):2 In a car, while stopped in traffic:3 Total:15  Objective:  Neurologic Exam  Physical Exam Physical Examination:   Filed Vitals:   11/16/15 1334  BP: 150/101  Pulse: 46  Resp: 16   General Examination: The patient is a very pleasant 55 y.o. male in no acute distress. He appears well-developed and well-nourished and adequately groomed.   HEENT: Normocephalic, atraumatic, pupils are equal, round and reactive to light and accommodation. Extraocular tracking is good without limitation to gaze excursion or nystagmus noted. Normal smooth pursuit is noted. Hearing is grossly intact. Tympanic membranes are clear bilaterally. Face is symmetric with normal facial animation and normal facial sensation. Speech is clear with no dysarthria noted. There is no hypophonia. There is no lip, neck/head, jaw or voice tremor. Neck is supple with full range of passive and active motion. There are no carotid bruits on auscultation. Oropharynx exam reveals: mild mouth dryness, marginal dental hygiene and several missing teeth, moderate airway crowding, due to larger tongue, tonsils in place L side 2+, R side 1+, somewhat larger uvula. Mallampati is class II. Tongue protrudes centrally and palate elevates symmetrically. Neck size is 15 3/8 inches. He has a Mild overbite.   Chest: Clear to auscultation without wheezing, rhonchi or crackles noted.  Heart: S1+S2+0, regular and normal without murmurs, rubs or gallops noted.   Abdomen: Soft, non-tender and non-distended with normal bowel  sounds appreciated on auscultation.  Extremities: There is no pitting edema in the distal lower extremities bilaterally. Pedal pulses are intact.  Skin: Warm and dry without trophic changes noted. There are no varicose veins.  Musculoskeletal: exam reveals no obvious joint deformities, tenderness or joint swelling or erythema.   Neurologically:  Mental status: The patient is awake, alert and oriented in all 4 spheres. His immediate and remote memory, attention, language skills and fund of knowledge are appropriate. There is no evidence of aphasia, agnosia, apraxia or anomia. Speech is clear with normal prosody and enunciation. Thought process is linear. Mood is normal and affect is blunted.  Cranial nerves II - XII are as described above under HEENT exam. In addition: shoulder shrug is normal with equal shoulder height noted. Motor exam: Normal bulk, strength and tone is noted on the R and mild  weakness in the L grip and mild increase in tone LUE. There is no drift, tremor or rebound. Romberg is negative. Reflexes are 2+ on the R and 3+ on the L. Fine motor skills and coordination: intact on the R and mildly impaired on the L. Cerebellar testing: No dysmetria or intention tremor on finger to nose testing. Heel to shin is unremarkable bilaterally. There is no truncal or gait ataxia.  Sensory exam: intact to light touch, pinprick, vibration, temperature sense in the upper and lower extremities.  Gait, station and balance: He stands with mild difficulty. No veering to one side is noted. No leaning to one side is noted. Posture is age-appropriate and stance is slightly wide-based. Gait shows mild limp on the L with mild circumduction and L arm drawn up.                Assessment and Plan:  In summary, Samuel Shaw is a very pleasant 55 y.o.-year old male with an underlying medical history of stroke in December 2015, stroke in January 2016, prior smoking, prior TIAs, carotid artery occlusion, whose  history and physical exam are indeed concerning for obstructive sleep apnea (OSA). I had a long chat with the patient and his wife about my findings and the diagnosis of OSA, its prognosis and treatment options. We talked about medical treatments, surgical interventions and non-pharmacological approaches. I explained in particular the risks and ramifications of untreated moderate to severe OSA, especially with respect to developing cardiovascular disease down the Road, including congestive heart failure, difficult to treat hypertension, cardiac arrhythmias, or stroke. Even type 2 diabetes has, in part, been linked to untreated OSA. Symptoms of untreated OSA include daytime sleepiness, memory problems, mood irritability and mood disorder such as depression and anxiety, lack of energy, as well as recurrent headaches, especially morning headaches. We talked about smoking cessation and trying to maintain a healthy lifestyle in general, as well as the importance of weight control. I encouraged the patient to eat healthy, exercise daily and keep well hydrated, to keep a scheduled bedtime and wake time routine, to not skip any meals and eat healthy snacks in between meals. I advised the patient not to drive when feeling sleepy. I recommended the following at this time: sleep study with potential positive airway pressure titration. (We will score hypopneas at 4% and split the sleep study into diagnostic and treatment portion, if the estimated. 2 hour AHI is >15/h). I asked the patient to increase his water intake and decrease his soda intake and the importance of blood pressure control and staying off of alcohol and smoking.   I explained the sleep test procedure to the patient and also outlined possible surgical and non-surgical treatment options of OSA, including the use of a custom-made dental device (which would require a referral to a specialist dentist or oral surgeon), upper airway surgical options, such as  pillar implants, radiofrequency surgery, tongue base surgery, and UPPP (which would involve a referral to an ENT surgeon). Rarely, jaw surgery such as mandibular advancement may be considered.  I also explained the CPAP treatment option to the patient, who indicated that he would be willing to try CPAP if the need arises. I explained the importance of being compliant with PAP treatment, not only for insurance purposes but primarily to improve His symptoms, and for the patient's long term health benefit, including to reduce His cardiovascular risks. I answered all their questions today and the patient and his wife were in agreement.  I would like to see him back after the sleep study is completed and encouraged him to call with any interim questions, concerns, problems or updates.   Thank you very much for allowing me to participate in the care of this nice patient. If I can be of any further assistance to you please do not hesitate to talk to me.   Sincerely,   Star Age, MD, PhD

## 2015-11-16 NOTE — Patient Instructions (Addendum)
Based on your symptoms and your exam I believe you are at risk for obstructive sleep apnea or OSA, and I think we should proceed with a sleep study to determine whether you do or do not have OSA and how severe it is. If you have more than mild OSA, I want you to consider treatment with CPAP. Please remember, the risks and ramifications of moderate to severe obstructive sleep apnea or OSA are: Cardiovascular disease, including congestive heart failure, stroke, difficult to control hypertension, arrhythmias, and even type 2 diabetes has been linked to untreated OSA. Sleep apnea causes disruption of sleep and sleep deprivation in most cases, which, in turn, can cause recurrent headaches, problems with memory, mood, concentration, focus, and vigilance. Most people with untreated sleep apnea report excessive daytime sleepiness, which can affect their ability to drive. Please do not drive if you feel sleepy.   I will likely see you back after your sleep study to go over the test results and where to go from there. We will call you after your sleep study to advise about the results (most likely, you will hear from Beverlee Nims, my nurse) and to set up an appointment at the time, as necessary.    Our sleep lab administrative assistant, Arrie Aran will meet with you or call you to schedule your sleep study. If you don't hear back from her by next week please feel free to call her at [phone]. This is her direct line and please leave a message with your phone number to call back if you get the voicemail box. She will call back as soon as possible.   Please reduce your soda intake and increase your water intake.  Please remember to try to maintain good sleep hygiene, which means: Keep a regular sleep and wake schedule, try not to exercise or have a meal within 2 hours of your bedtime, try to keep your bedroom conducive for sleep, that is, cool and dark, without light distractors such as an illuminated alarm clock, and refrain  from watching TV right before sleep or in the middle of the night and do not keep the TV or radio on during the night. Also, try not to use or play on electronic devices at bedtime, such as your cell phone, tablet PC or laptop. If you like to read at bedtime on an electronic device, try to dim the background light as much as possible. Do not eat in the middle of the night.

## 2015-12-02 ENCOUNTER — Ambulatory Visit: Payer: Medicaid Other | Admitting: Physical Medicine & Rehabilitation

## 2015-12-02 ENCOUNTER — Encounter: Payer: Medicaid Other | Attending: Physical Medicine & Rehabilitation

## 2015-12-02 DIAGNOSIS — I63511 Cerebral infarction due to unspecified occlusion or stenosis of right middle cerebral artery: Secondary | ICD-10-CM | POA: Insufficient documentation

## 2015-12-02 DIAGNOSIS — G8110 Spastic hemiplegia affecting unspecified side: Secondary | ICD-10-CM | POA: Insufficient documentation

## 2015-12-02 DIAGNOSIS — I6931 Attention and concentration deficit following cerebral infarction: Secondary | ICD-10-CM | POA: Insufficient documentation

## 2015-12-02 DIAGNOSIS — R414 Neurologic neglect syndrome: Secondary | ICD-10-CM | POA: Insufficient documentation

## 2015-12-08 ENCOUNTER — Ambulatory Visit (INDEPENDENT_AMBULATORY_CARE_PROVIDER_SITE_OTHER): Payer: Medicaid Other | Admitting: Neurology

## 2015-12-08 DIAGNOSIS — G4720 Circadian rhythm sleep disorder, unspecified type: Secondary | ICD-10-CM

## 2015-12-08 DIAGNOSIS — G4710 Hypersomnia, unspecified: Secondary | ICD-10-CM

## 2015-12-08 DIAGNOSIS — G4733 Obstructive sleep apnea (adult) (pediatric): Secondary | ICD-10-CM | POA: Diagnosis not present

## 2015-12-14 ENCOUNTER — Other Ambulatory Visit: Payer: Self-pay | Admitting: Physical Medicine & Rehabilitation

## 2015-12-16 ENCOUNTER — Telehealth: Payer: Self-pay | Admitting: Neurology

## 2015-12-16 NOTE — Telephone Encounter (Signed)
I spoke to patient and he is aware of results and recommendations. We were able to set up f/u appt. I sent copy of report to PCP.

## 2015-12-16 NOTE — Telephone Encounter (Signed)
Patient referred by Dr. Erlinda Hong, seen by me on 11/16/15, diagnostic PSG on 12/08/15.   Please call and notify the patient that the recent sleep study did not show any significant obstructive sleep apnea. Please inform patient that I would like to go over the details of the study during a follow up appointment. Arrange a followup appointment. Also, route or fax report to PCP and referring MD, if other than PCP.  Once you have spoken to patient, you can close this encounter.   Thanks,  Star Age, MD, PhD [facility])

## 2015-12-24 ENCOUNTER — Encounter (HOSPITAL_COMMUNITY): Payer: Self-pay

## 2015-12-24 DIAGNOSIS — Z7982 Long term (current) use of aspirin: Secondary | ICD-10-CM | POA: Diagnosis not present

## 2015-12-24 DIAGNOSIS — Z87891 Personal history of nicotine dependence: Secondary | ICD-10-CM | POA: Insufficient documentation

## 2015-12-24 DIAGNOSIS — G43909 Migraine, unspecified, not intractable, without status migrainosus: Secondary | ICD-10-CM | POA: Diagnosis not present

## 2015-12-24 DIAGNOSIS — Z8673 Personal history of transient ischemic attack (TIA), and cerebral infarction without residual deficits: Secondary | ICD-10-CM | POA: Diagnosis not present

## 2015-12-24 DIAGNOSIS — I10 Essential (primary) hypertension: Secondary | ICD-10-CM | POA: Diagnosis not present

## 2015-12-24 DIAGNOSIS — R51 Headache: Secondary | ICD-10-CM | POA: Diagnosis present

## 2015-12-24 DIAGNOSIS — Z79899 Other long term (current) drug therapy: Secondary | ICD-10-CM | POA: Insufficient documentation

## 2015-12-24 NOTE — ED Notes (Signed)
Per pt he started having HA x 2 days ago; Pt states pain at 3/10 on arrival. Pt states he has hx of stroke in 2015 with left side deficits; pt walks with cane; pt denies any other symptoms on arrival.

## 2015-12-25 ENCOUNTER — Emergency Department (HOSPITAL_COMMUNITY)
Admission: EM | Admit: 2015-12-25 | Discharge: 2015-12-25 | Disposition: A | Discharge status: Home / Self Care | Payer: Medicaid Other | Attending: Emergency Medicine | Admitting: Emergency Medicine

## 2015-12-25 DIAGNOSIS — G43009 Migraine without aura, not intractable, without status migrainosus: Secondary | ICD-10-CM

## 2015-12-25 MED ORDER — DIPHENHYDRAMINE HCL 50 MG/ML IJ SOLN
25.0000 mg | Freq: Once | INTRAMUSCULAR | Status: AC
Start: 2015-12-25 — End: 2015-12-25
  Administered 2015-12-25: 25 mg via INTRAVENOUS
  Filled 2015-12-25: qty 1

## 2015-12-25 MED ORDER — DEXAMETHASONE SODIUM PHOSPHATE 10 MG/ML IJ SOLN
10.0000 mg | Freq: Once | INTRAMUSCULAR | Status: AC
  Administered 2015-12-25: 10 mg via INTRAVENOUS
  Filled 2015-12-25: qty 1

## 2015-12-25 MED ORDER — SODIUM CHLORIDE 0.9 % IV BOLUS (SEPSIS)
1000.0000 mL | Freq: Once | INTRAVENOUS | Status: AC
  Administered 2015-12-25: 1000 mL via INTRAVENOUS

## 2015-12-25 MED ORDER — BUTALBITAL-APAP-CAFFEINE 50-325-40 MG PO TABS
1.0000 | ORAL_TABLET | Freq: Four times a day (QID) | ORAL | Status: DC | PRN
Start: 2015-12-25 — End: 2016-01-05

## 2015-12-25 MED ORDER — METOCLOPRAMIDE HCL 5 MG/ML IJ SOLN
10.0000 mg | Freq: Once | INTRAMUSCULAR | Status: AC
  Administered 2015-12-25: 10 mg via INTRAVENOUS
  Filled 2015-12-25: qty 2

## 2015-12-25 MED ORDER — PROMETHAZINE HCL 25 MG PO TABS: 25.0000 mg | ORAL_TABLET | Freq: Four times a day (QID) | ORAL | Status: DC | PRN

## 2015-12-25 MED ORDER — KETOROLAC TROMETHAMINE 30 MG/ML IJ SOLN
30.0000 mg | Freq: Once | INTRAMUSCULAR | Status: AC
  Administered 2015-12-25: 30 mg via INTRAVENOUS
  Filled 2015-12-25: qty 1

## 2015-12-25 NOTE — ED Provider Notes (Signed)
TIME SEEN: 4:15 AM  CHIEF COMPLAINT: Headache, nausea  HPI: Pt is a 55 y.o. male with history of previous stroke with left upper extremity weakness and mild left-sided facial droop, hypertension and hyperlipidemia who presents emergency department with throbbing headache on the top of his head for the past 2 days. Describes it as gradual in onset and similar to previous headaches he has had before that usually resolve with time. Has nausea but no vomiting. No recent head injury. Is on aspirin but no other anti-platelet her anticoagulation. Denies new numbness or focal weakness. Wife reports he has been having more frequent episodes of headaches. Took 800 mg of ibuprofen at home without relief.  ROS: See HPI Constitutional: no fever  Eyes: no drainage  ENT: no runny nose   Cardiovascular:  no chest pain  Resp: no SOB  GI: no vomiting GU: no dysuria Integumentary: no rash  Allergy: no hives  Musculoskeletal: no leg swelling  Neurological: no slurred speech ROS otherwise negative  PAST MEDICAL HISTORY/PAST SURGICAL HISTORY:  Past Medical History  Diagnosis Date  . Stroke ([place])   . Hyperlipidemia   . Hypertension     MEDICATIONS:  Prior to Admission medications   Medication Sig Start Date End Date Taking? Authorizing Provider  acetaminophen (TYLENOL) 325 MG tablet Take 2 tablets (650 mg total) by mouth every 6 (six) hours as needed for mild pain or headache. 07/08/14   Albertine Patricia, MD  aspirin 325 MG tablet Take 1 tablet (325 mg total) by mouth daily. 07/29/14   Lavon Paganini Angiulli, PA-C  atorvastatin (LIPITOR) 20 MG tablet Take 1 tablet (20 mg total) by mouth daily at 6 PM. 07/29/14   Lavon Paganini Angiulli, PA-C  donepezil (ARICEPT) 5 MG tablet Take 5 mg by mouth at bedtime. 04/09/15   Historical Provider, MD  fexofenadine (ALLEGRA) 30 MG tablet Take 30 mg by mouth 2 (two) times daily.    Historical Provider, MD  folic acid (FOLVITE) A999333 MCG tablet Take 1 tablet (400 mcg total) by mouth  daily. 07/29/14   Lavon Paganini Angiulli, PA-C  gabapentin (NEURONTIN) 300 MG capsule take 1 capsule BY MOUTH THREE TIMES DAILY 12/15/15   Charlett Blake, MD  Thiamine HCl (VITAMIN B-1) 250 MG tablet Take 250 mg by mouth daily.    Historical Provider, MD  traMADol (ULTRAM) 50 MG tablet TAKE 1 TABLET BY MOUTH TWICE DAILY 03/30/15   Charlett Blake, MD  traZODone (DESYREL) 50 MG tablet Take 50 mg by mouth at bedtime. 03/04/15   Historical Provider, MD    ALLERGIES:  No Known Allergies  SOCIAL HISTORY:  Social History  Substance Use Topics  . Smoking status: Former Smoker -- 1.00 packs/day for 15 years    Types: Cigarettes    Quit date: 06/26/2014  . Smokeless tobacco: Never Used  . Alcohol Use: No    FAMILY HISTORY: Family History  Problem Relation Age of Onset  . Diabetes Mother     EXAM: BP 112/77 mmHg  Pulse 60  Temp(Src) 98.1 F (36.7 C) (Oral)  Resp 17  Ht 6\' 2"  (1.88 m)  Wt 181 lb 8 oz (82.328 kg)  BMI 23.29 kg/m2  SpO2 99% CONSTITUTIONAL: Alert and oriented and responds appropriately to questions. Well-appearing; well-nourished HEAD: Normocephalic, Atraumatic EYES: Conjunctivae clear, PERRL ENT: normal nose; no rhinorrhea; moist mucous membranes NECK: Supple, no meningismus, no LAD  CARD: RRR; S1 and S2 appreciated; no murmurs, no clicks, no rubs, no gallops RESP: Normal chest  excursion without splinting or tachypnea; breath sounds clear and equal bilaterally; no wheezes, no rhonchi, no rales, no hypoxia or respiratory distress, speaking full sentences ABD/GI: Normal bowel sounds; non-distended; soft, non-tender, no rebound, no guarding, no peritoneal signs BACK:  The back appears normal and is non-tender to palpation, there is no CVA tenderness EXT: Normal ROM in all joints; non-tender to palpation; no edema; normal capillary refill; no cyanosis, no calf tenderness or swelling    SKIN: Normal color for age and race; warm; no rash NEURO: Moves all extremities equally,  sensation to light touch intact diffusely, mild pronator drift of left shin which he reports is his baseline, mild left-sided facial droop that is very minimal on exam otherwise cranial nerves II through XII intact, no dysarthria or aphasia PSYCH: The patient's mood and manner are appropriate. Grooming and personal hygiene are appropriate.  MEDICAL DECISION MAKING: Patient here with what sounds like a migraine headache. Has had similar headaches before. Denies that this is a sudden onset, thunderclap or severe headache. No new neurologic deficits. I do not feel this time he needs emergent head imaging. No history of cancer. No red flag symptoms. We'll give Toradol, Reglan, Benadryl, IV fluids and Decadron and reassess. Anticipate outpatient follow-up with neurology.  ED PROGRESS: 5:45 AM  Pt's headache is completely resolved after migraine cocktail. I feel he is safe for discharge. We'll discharge with prescriptions for Fioricet, Phenergan to take at home as needed if symptoms return. I feel he should follow-up with her outpatient neurologist. Discussed return precautions with patient and his wife. They verbalize understanding and are comfortable with this plan.   At this time, I do not feel there is any life-threatening condition present. I have reviewed and discussed all results (EKG, imaging, lab, urine as appropriate), exam findings with patient. I have reviewed nursing notes and appropriate previous records.  I feel the patient is safe to be discharged home without further emergent workup. Discussed usual and customary return precautions. Patient and family (if present) verbalize understanding and are comfortable with this plan.  Patient will follow-up with their primary care provider. If they do not have a primary care provider, information for follow-up has been provided to them. All questions have been answered.      [place], DO 12/25/15 [phone]

## 2015-12-25 NOTE — Discharge Instructions (Signed)

## 2015-12-31 ENCOUNTER — Telehealth: Payer: Self-pay | Admitting: Neurology

## 2015-12-31 NOTE — Telephone Encounter (Signed)
Rn call patients wife [name] about her husbands recent dizzy spells, and headaches. PT went to the ED on June 25 and was dehydrated with a headache. Pt was given a headache cocktail,and IV fluids. PTs wife stated her husband does not stay hydrated like he should. PT has to be encourage to stay hydrated. Pt has seen the ED MD on 12/26/2015 and his PCP this week for headaches and dizzy spells. Rn stated Dr. Erlinda Hong will not be back in the office till July 10. Pts wife stated she can wait till July 10 for the appt. Pt is stable for now and his PCP recommended seeing his neurologist again. Appt schedule for 01/10/2016. Pts wife will keep a eye on him and will take him to the ED if he starts to have major headaches and dizzy spells again.

## 2015-12-31 NOTE — Telephone Encounter (Signed)
Called to remind patient of appointment with Dr. Rexene Alberts Wednesday July 5th, wife states husband has been having migraines for 2 days, is in a lot of pain, dizzy, saw spots, states he won't get out of the bed, she is concerned about him, has history of stroke. Denies numbness or weakness of face, arm or leg, especially on one side, when she asks what's going on, it's hard to understand him, denies trouble walking, loss of balance or coordination, husband asked to go to hospital, took to hospital last Sunday night June 25th. Patient was given cocktail, back up fluids, was advised to follow up with Neurologist.

## 2016-01-05 ENCOUNTER — Ambulatory Visit (INDEPENDENT_AMBULATORY_CARE_PROVIDER_SITE_OTHER): Payer: Medicaid Other | Admitting: Neurology

## 2016-01-05 ENCOUNTER — Encounter: Payer: Self-pay | Admitting: Neurology

## 2016-01-05 VITALS — BP 92/66 | HR 63 | Resp 18 | Ht 74.0 in | Wt 180.0 lb

## 2016-01-05 DIAGNOSIS — G479 Sleep disorder, unspecified: Secondary | ICD-10-CM

## 2016-01-05 NOTE — Patient Instructions (Signed)
Please remember, common headache triggers are: sleep deprivation, dehydration, overheating, stress, hypoglycemia or skipping meals and blood sugar fluctuations, excessive pain medications or excessive alcohol use or caffeine withdrawal. Some people have food triggers such as aged cheese, orange juice or chocolate, especially dark chocolate, or MSG (monosodium glutamate). Try to avoid these headache triggers as much possible. It may be helpful to keep a headache diary to figure out what makes your headaches worse or brings them on and what alleviates them. Some people report headache onset after exercise but studies have shown that regular exercise may actually prevent headaches from coming. If you have exercise-induced headaches, please make sure that you drink plenty of fluid before and after exercising and that you do not over do it and do not overheat.  Reduce your caffeine and soda intake and drink more water.   Keep your appointment with Dr. Letta Pate and Dr. Erlinda Hong.   Please remember to try to maintain good sleep hygiene, which means: Keep a regular sleep and wake schedule, try not to exercise or have a meal within 2 hours of your bedtime, try to keep your bedroom conducive for sleep, that is, cool and dark, without light distractors such as an illuminated alarm clock, and refrain from watching TV right before sleep or in the middle of the night and do not keep the TV or radio on during the night. Also, try not to use or play on electronic devices at bedtime, such as your cell phone, tablet PC or laptop. If you like to read at bedtime on an electronic device, try to dim the background light as much as possible. Do not eat in the middle of the night.

## 2016-01-05 NOTE — Progress Notes (Signed)
Subjective:    Patient ID: Samuel Shaw is a 55 y.o. male.  HPI     Interim history:   Samuel Shaw is a 55 year old right-handed gentleman with an underlying medical history of stroke in December 2015 (when in [place]), prior smoking, prior TIAs (per wife 2010), carotid artery occlusion, who presents for follow up consultation of his sleep disturbance, after his recent sleep study. The patient is accompanied by his wife today. I first met him on 11/16/2015 at the request of Dr. Erlinda Hong, at which time the patient reported snoring and excessive daytime somnolence and his wife had noted breathing pauses while he is asleep. I invited him for sleep study. He had a baseline sleep study on 12/08/2015. I went over his test results with him in detail today. Sleep efficiency was 77.1%. Sleep latency was 21 minutes, wake after sleep onset was 68.5 minutes with mild sleep fragmentation noted but one longer period of wakefulness. He had a normal arousal index. He had an increased percentage of light stage sleep, absence of slow-wave sleep and a low percentage of REM sleep at 5.3% with a markedly prolonged REM latency of 232 minutes. He had no significant PLMS, EKG or EEG changes. He had mild intermittent snoring. Total AHI was 0.2 per hour. Average oxygen saturation was 93%, nadir was 88%.  Today, 01/05/2016: He reports Doing about the same. No new sleep-related issues, does snore, per wife, this is worse when he sleeps on his back. He presented to the emergency room on 12/25/2015 for a 2 day history of migraine headache. He was treated with a migraine cocktail and improved. He has not had a headache since then. He does take tramadol 50 mg once twice daily. He takes trazodone at night for sleep. I reviewed the emergency room records from 12/25/2015. He does have a history of infrequent migraine headaches. He does admit that he does not drink enough water. He is drinking sodas mostly. He was also eating quite a bit of cheese  at the time. He likes to eat cheese. Overall, however, appetite is not great and he does not always eat well or drink enough water per wife. He does nap during the day, multiple shorter naps. He has left arm pain, secondary to spasms and stiffness. He has not had a Botox injection as I understand since December 2016. He has an appointment with Dr. Erlinda Hong and Kirsteins pending.    Previously:   11/16/2015: He reports snoring and excessive daytime somnolence. His wife has noted apneic pauses while he is asleep. He is sleepy during the day and takes several shorter naps during the day. His ESS is 15/24 today, FSS is 45/63.   His bedtime varies from 9 PM to MN and TV stays on all night. He wakes up around 6:30 AM. He was a heavy Company secretary.   He quit smoking after his stroke and does not drink alcohol, no more Marijuana. He drinks some coffee every other day or so but does drink a lot of Cola, not enough water.   He has three grown daughters and 2 grandchildren, the grandson lives with them.   He denies RLS symptoms and is not known to kick in his sleep. He has no morning headaches. He denies nocturia. He has no overt FHx of OSA, but mother snores loudly.   I reviewed your office note from 08/11/2015. He sees Dr. Letta Pate for botulinum toxin injection for left upper extremity spasticity poststroke.   His Past Medical  History Is Significant For: Past Medical History  Diagnosis Date  . Stroke ([place])   . Hyperlipidemia   . Hypertension     His Past Surgical History Is Significant For: No past surgical history on file.  His Family History Is Significant For: Family History  Problem Relation Age of Onset  . Diabetes Mother     His Social History Is Significant For: Social History   Social History  . Marital Status: Married    Spouse Name: [name]  . Number of Children: 3  . Years of Education: 2y coll   Occupational History  . Disabled      not working at this time   Social  History Main Topics  . Smoking status: Former Smoker -- 1.00 packs/day for 15 years    Types: Cigarettes    Quit date: 06/26/2014  . Smokeless tobacco: Never Used  . Alcohol Use: No  . Drug Use: No  . Sexual Activity: Not Asked   Other Topics Concern  . None   Social History Narrative   Caffeine.1-2 drinks a day .    His Allergies Are:  No Known Allergies:   His Current Medications Are:  Outpatient Encounter Prescriptions as of 01/05/2016  Medication Sig  . acetaminophen (TYLENOL) 325 MG tablet Take 2 tablets (650 mg total) by mouth every 6 (six) hours as needed for mild pain or headache.  Marland Kitchen aspirin 325 MG tablet Take 1 tablet (325 mg total) by mouth daily.  Marland Kitchen atorvastatin (LIPITOR) 20 MG tablet Take 1 tablet (20 mg total) by mouth daily at 6 PM.  . donepezil (ARICEPT) 5 MG tablet Take 5 mg by mouth at bedtime.  . fexofenadine (ALLEGRA) 30 MG tablet Take 30 mg by mouth 2 (two) times daily.  . folic acid (FOLVITE) 161 MCG tablet Take 1 tablet (400 mcg total) by mouth daily.  Marland Kitchen gabapentin (NEURONTIN) 300 MG capsule take 1 capsule BY MOUTH THREE TIMES DAILY  . Thiamine HCl (VITAMIN B-1) 250 MG tablet Take 250 mg by mouth daily.  Marland Kitchen tiZANidine (ZANAFLEX) 4 MG tablet Take 4 mg by mouth 3 (three) times daily.  . traMADol (ULTRAM) 50 MG tablet TAKE 1 TABLET BY MOUTH TWICE DAILY  . traZODone (DESYREL) 50 MG tablet Take 50 mg by mouth at bedtime.  . [DISCONTINUED] butalbital-acetaminophen-caffeine (FIORICET) 50-325-40 MG tablet Take 1-2 tablets by mouth every 6 (six) hours as needed for headache.  . [DISCONTINUED] promethazine (PHENERGAN) 25 MG tablet Take 1 tablet (25 mg total) by mouth every 6 (six) hours as needed for nausea or vomiting.   No facility-administered encounter medications on file as of 01/05/2016.  :  Review of Systems:  Out of a complete 14 point review of systems, all are reviewed and negative with the exception of these symptoms as listed below:   Review of Systems   Neurological:       Patient is here to discuss sleep study. No new concerns.     Objective:  Neurologic Exam  Physical Exam Physical Examination:   Filed Vitals:   01/05/16 1416  BP: 92/66  Pulse: 63  Resp: 18   General Examination: The patient is a very pleasant 55 y.o. male in no acute distress. He appears well-developed and well-nourished and adequately groomed.   HEENT: Normocephalic, atraumatic, pupils are equal, round and reactive to light and accommodation. Extraocular tracking is good without limitation to gaze excursion or nystagmus noted. Normal smooth pursuit is noted. Hearing is grossly intact. Face is symmetric  with normal facial animation and normal facial sensation. Speech is clear with no dysarthria noted. There is no hypophonia. There is no lip, neck/head, jaw or voice tremor. Neck is supple with full range of passive and active motion. There are no carotid bruits on auscultation. Oropharynx exam reveals: mild mouth dryness, marginal dental hygiene and several missing teeth, moderate airway crowding, due to larger tongue, tonsils in place L side 2+, R side 1+, somewhat larger uvula. Mallampati is class II. Tongue protrudes centrally and palate elevates symmetrically.  Chest: Clear to auscultation without wheezing, rhonchi or crackles noted.  Heart: S1+S2+0, regular and normal without murmurs, rubs or gallops noted.   Abdomen: Soft, non-tender and non-distended with normal bowel sounds appreciated on auscultation.  Extremities: There is no pitting edema in the distal lower extremities bilaterally. Pedal pulses are intact.  Skin: Warm and dry without trophic changes noted. There are no varicose veins.  Musculoskeletal: exam reveals no obvious joint deformities, tenderness or joint swelling or erythema.   Neurologically:  Mental status: The patient is awake, alert and oriented in all 4 spheres. His immediate and remote memory, attention, language skills and fund of  knowledge are appropriate. There is no evidence of aphasia, agnosia, apraxia or anomia. Speech is clear with normal prosody and enunciation. Thought process is linear. Mood is normal and affect is blunted.  Cranial nerves II - XII are as described above under HEENT exam. In addition: shoulder shrug is normal with equal shoulder height noted. Motor exam: Normal bulk, strength and tone is noted on the R and mild weakness in the L grip and mild increase in tone LUE. There is no drift, tremor or rebound. Romberg is negative. Reflexes are 2+ on the R and 3+ on the L. Fine motor skills and coordination: intact on the R and mildly impaired on the L. Cerebellar testing: No dysmetria or intention tremor on finger to nose testing. Heel to shin is unremarkable bilaterally. There is no truncal or gait ataxia.  Sensory exam: intact to light touch, vibration, temperature sense in the upper and lower extremities.  Gait, station and balance: He stands with mild difficulty. No veering to one side is noted. No leaning to one side is noted. Posture is age-appropriate and stance is slightly wide-based. Gait shows mild limp on the L with mild circumduction and L arm drawn up.                Assessment and Plan:  In summary, Samuel Shaw is a very pleasant 55 year old male with an underlying medical history of stroke in December 2015, stroke in January 2016, prior smoking, prior TIAs, and carotid artery occlusion, residual left-sided spastic hemiparesis, who presents for follow-up consultation of his sleep disturbance, after his recent baseline sleep study from 12/08/2015. We talked about his test results in detail today. His physical exam is stable. We talked about maintaining good sleep hygiene and healthy lifestyle, secondary stroke prevention again today.  We also talked about headache triggers. He is advised to stay better hydrated with water and reduce his caffeine intake. Excess caffeine can trigger headaches and daily  pain medication can also worsen headaches. He is encouraged to discuss this with Dr. Letta Pate at the next visit, he may benefit from an additional Botox injection in his left upper extremity. From my end of things, I suggested an as needed follow-up with me. I answered all her questions today and the patient and his wife were in agreement. They are reminded  to keep his pending appointment with Dr. Erlinda Hong.  I spent 25 minutes in total face-to-face time with the patient, more than 50% of which was spent in counseling and coordination of care, reviewing test results, reviewing medication and discussing or reviewing the diagnosis of sleep disturbance, improvement of sleep hygiene, headache triggers, healthy lifestyle, secondary stroke prevention, the prognosis and treatment options.

## 2016-01-10 ENCOUNTER — Encounter: Payer: Self-pay | Admitting: Neurology

## 2016-01-10 ENCOUNTER — Ambulatory Visit (INDEPENDENT_AMBULATORY_CARE_PROVIDER_SITE_OTHER): Payer: Medicaid Other | Admitting: Neurology

## 2016-01-10 VITALS — BP 125/84 | HR 55 | Ht 74.0 in | Wt 182.6 lb

## 2016-01-10 DIAGNOSIS — R51 Headache: Secondary | ICD-10-CM

## 2016-01-10 DIAGNOSIS — I10 Essential (primary) hypertension: Secondary | ICD-10-CM | POA: Diagnosis not present

## 2016-01-10 DIAGNOSIS — I69354 Hemiplegia and hemiparesis following cerebral infarction affecting left non-dominant side: Secondary | ICD-10-CM

## 2016-01-10 DIAGNOSIS — R519 Headache, unspecified: Secondary | ICD-10-CM

## 2016-01-10 DIAGNOSIS — E785 Hyperlipidemia, unspecified: Secondary | ICD-10-CM | POA: Diagnosis not present

## 2016-01-10 DIAGNOSIS — G4710 Hypersomnia, unspecified: Secondary | ICD-10-CM | POA: Insufficient documentation

## 2016-01-10 DIAGNOSIS — I63231 Cerebral infarction due to unspecified occlusion or stenosis of right carotid arteries: Secondary | ICD-10-CM | POA: Diagnosis not present

## 2016-01-10 NOTE — Patient Instructions (Signed)
-   continue ASA and lipitor for stroke prevention - check BP at home, BP goal 120-140. Avoid low BP due to your right carotid artery occlusion - avoid dehydration and keep hydrated. - discuss with Dr. Criss Rosales to increase aricept from 5mg  to 10mg  as you have been tolerating it well.  - Follow up with your primary care physician for stroke risk factor modification. Recommend maintain blood pressure goal 130-140, diabetes with hemoglobin A1c goal below 6.5% and lipids with LDL cholesterol goal below 70 mg/dL.  - continue home exercise for left side mild weakness. - follow up with Dr. Letta Pate - follow up in 6 months.

## 2016-01-10 NOTE — Progress Notes (Signed)
STROKE NEUROLOGY FOLLOW UP NOTE  NAME: Samuel Shaw DOB: 12-21-1960  REASON FOR VISIT: stroke follow up HISTORY FROM: wife and chart  Today we had the pleasure of seeing Samuel Shaw in follow-up at our Neurology [facility]. Pt was accompanied by wife.   History Summary Samuel Shaw is an 55 y.o. male PMH of 2 TIAs 2 years ago, right carotid stenosis but no intervention, and current smoker was admitted on 07/06/14 for subacute CVA in late 06/2014 in [place] with progressive left-sided weakness since the discharge. These symptoms are similar to his stroke in December, the family just feels they are getting worse. A review of records from [place] are pertinent for a head CT showing an acute right basal ganglia infarct and he is on ASA 325mg  daily. MRI showed multifocal subacute to remote infarct at right MCA territory (no new infarct) and CTA neck showed right ICA occlusion, athero vs. Dissection. He was continued on discharged to [facility] .   Follow up 10/08/14 - the patient has been doing well. Discharged from [facility] on 07/29/14 and currently outpt PT/OT, 2 times per week. He made significant progress from rehab. Currently he is walking with cane and steady. His BP is 118/85 today, he is still on ASA and lipitor. He has quit smoking and on nicotine patch now.  02/08/15 follow up - pt has been doing well. Quit smoking completely. Had CTA head and neck did not show right ICA recannulization. TCD emboli detection negative and VMR showed bilateral changes. Has finished off PT/OT and also stopped BP meds due to low BP. Today 131/98 in clinic. He is not doing much exercise at home.   08/11/15 follow up - pt has been doing the same. Has short memory loss and was put on Aricept by PCP. BP 126/85. No recurrent stroke like symptoms. Still has left sided mild weakness, doing home exercise.   Interval History During the interval time, pt had one ER visit on 12/25/15 for prolonged HA, received HA cocktail and HA  resolved. As per wife, he has HA 1-2 per year and never lasted more than a day. This time, it lasted more than two days. It normally caused by dehydration and this time he was working in the heat and again did not drink enough water. He also followed with Dr. Rexene Alberts and ruled out OSA so far. He also follows with Dr. Read Drivers for botox for muscle spasm. In terms of left sided weakness, it is stable and not change much. Pt seems not exercise as he should be as per wife.   REVIEW OF SYSTEMS: Full 14 system review of systems performed and notable only for those listed below and in HPI above, all others are negative:  Constitutional:   Cardiovascular:  Ear/Nose/Throat:   Skin:  Eyes:   Respiratory:   Gastroitestinal:   Genitourinary:  Hematology/Lymphatic:   Endocrine:  Musculoskeletal:   Allergy/Immunology:   Neurological: HA Psychiatric:  Sleep:   The following represents the patient's updated allergies and side effects list: No Known Allergies  The neurologically relevant items on the patient's problem list were reviewed on today's visit.  Neurologic Examination  A problem focused neurological exam (12 or more points of the single system neurologic examination, vital signs counts as 1 point, cranial nerves count for 8 points) was performed.  Blood pressure 125/84, pulse 55, height 6\' 2"  (1.88 m), weight 182 lb 9.6 oz (82.827 kg).  General - Well nourished, well developed, in no apparent  distress.  Ophthalmologic - Sharp disc margins OU.  Cardiovascular - Regular rate and rhythm with no murmur.  Mental Status -  Level of arousal and orientation to time, place, and person were intact. Language including expression, naming, repetition, comprehension was assessed and found intact.  Cranial Nerves II - XII - II - Visual field intact OU. III, IV, VI - Extraocular movements intact. V - Facial sensation intact bilaterally. VII - mild left facial droop. VIII - Hearing & vestibular  intact bilaterally. X - Palate elevates symmetrically. XI - Chin turning & shoulder shrug intact bilaterally. XII - Tongue protrusion intact.  Motor Strength - The patient's strength was LUE 5-/5 without pronator drift, LLE 4/5 proximal and 4+/5 distally.  Bulk was normal and fasciculations were absent.   Motor Tone - Muscle tone was assessed at the neck and appendages and was normal.  Reflexes - The patient's reflexes were normal in all extremities and he had no pathological reflexes.  Sensory - Light touch, temperature/pinprick were assessed and were normal.    Coordination - The patient had normal movements in the hands and feet with no ataxia or dysmetria.  Tremor was absent.  Gait and Station - walk with cane and hemiparetic gait on the left.  Data reviewed: I personally reviewed the images and agree with the radiology interpretations.  Dg Chest 2 View 07/06/2014 CLINICAL DATA: Increasing left-sided weakness. Previous stroke. Smoker. EXAM: CHEST 2 VIEW COMPARISON: 10/31/2008. FINDINGS: Normal sized heart. Clear lungs. Increased density overlying the posterior aspect of the lower thoracic spine. IMPRESSION: Possible mass overlying the posterior aspect of the lower thoracic spine. A chest CT with contrast is recommended. Electronically Signed By: Enrique Sack M.D.  On: 07/06/2014 15:54   Ct Head Wo Contrast 07/06/2014 No acute finding. Findings consistent with subacute to remote right MCA territory infarct. Chronic microvascular ischemic change also noted.   Mr Brain Wo Contrast 07/06/2014 Acute multifocal infarcts within RIGHT middle cerebral artery territory (including RIGHT basal ganglia). RIGHT anterior occipital likely within RIGHT middle cerebral artery territory/watershed. Limited blood sensitive sequence without lobar hematoma. Mild white matter changes, independent of acute areas of ischemia suggest chronic small vessel ischemic disease.   Mr Jodene Nam Head/brain  Wo Cm 07/06/2014 RIGHT internal carotid artery occlusion. Bilateral posterior communicating arteries and suspected tiny anterior communicating artery present. Diminutive RIGHT middle cerebral artery flow related enhancement. Recommend CT angiogram of the neck for further evaluation.   CT angio neck 07/07/2014 Right internal carotid artery is occluded 1 cm above its origin. Etiology indeterminate. Focal plaque proximal left internal carotid artery without measurable/significant stenosis. Mild narrowing proximal right vertebral artery. Slightly ectatic ascending thoracic aorta (3.4 cm). Surrounding incidental findings.  2D echo - Left ventricle: The cavity size was normal. Systolic function was normal. The estimated ejection fraction was in the range of 55% to 60%. Wall motion was normal; there were no regional wall motion abnormalities.  CTA head and neck 10/20/14 - Complete occlusion RIGHT internal carotid artery 1 cm above its origin. This likely relates either to dissection or focal atheromatous plaque. Reconstitution of the RIGHT anterior circulation intracranially via collateral flow No acute findings. Chronic RIGHT hemisphere MCA territory infarction.  TCD MES -  Negative for 85min  TCD VMR - decreased reserve bilaterally  Component     Latest Ref Rng 07/07/2014  Cholesterol     0 - 200 mg/dL 182  Triglycerides     <150 mg/dL 116  HDL     >39 mg/dL 24 (  L)  Total CHOL/HDL Ratio      7.6  VLDL     0 - 40 mg/dL 23  LDL (calc)     0 - 99 mg/dL 135 (H)  Hemoglobin A1C     <5.7 % 5.6  Mean Plasma Glucose     <117 mg/dL 114  TSH     0.350 - 4.500 uIU/mL 0.473    Assessment: As you may recall, he is a 55 y.o. African American male with PMH of TIA, right carotid stenosis without intervention, smoker was admitted due to right MCA territory stroke. Found to have right ICA occlusion. He seems to have TIAs in 2010 and found to have right ICA stenosis at that time but no  intervention done. His ICA occlusion likely due to athero, but dissection is also possibility as left ICA was smooth. Repeat CTA head and neck showed no recannulization. TCD MES negative. Has quit smoking. Stable mild left sided hemiparesis. BP stable off BP meds. Following with Dr. Letta Pate for botox injection. Recently had ER visit for HA likely due to dehydration. Recommend to keep hydrated.   Plan:  - continue ASA and lipitor for stroke prevention - check BP at home, BP goal 120-140. Avoid low BP due to right ICA occlusion - avoid dehydration and keep hydrated. - discuss with Dr. Criss Rosales to increase aricept from 5mg  to 10mg  as you have been tolerating it well.  - Follow up with your primary care physician for stroke risk factor modification. Recommend maintain blood pressure goal 130-140, diabetes with hemoglobin A1c goal below 6.5% and lipids with LDL cholesterol goal below 70 mg/dL.  - continue home exercise for left side mild weakness. - follow up with Dr. Letta Pate - follow up in 6 months.   I spent more than 25 minutes of face to face time with the patient. Greater than 50% of time was spent in counseling and coordination of care. We discussed HA prevention, avoid dehydration, BP control, and aricept dose adjustment.    No orders of the defined types were placed in this encounter.    Meds ordered this encounter  Medications  . promethazine (PHENERGAN) 25 MG tablet    Sig: TAKE 1 TABLET BY MOUTH EVERY 6 HOURS AS NEEDED FOR NAUSEA OR VOMITING    Refill:  0  . butalbital-acetaminophen-caffeine (FIORICET, ESGIC) 50-325-40 MG tablet    Sig: TAKE 1 TO 2 TABLETS BY MOUTH EVERY 6 HOURS AS NEEDED FOR HEADACHE    Refill:  0    Patient Instructions  - continue ASA and lipitor for stroke prevention - check BP at home, BP goal 120-140. Avoid low BP due to your right carotid artery occlusion - avoid dehydration and keep hydrated. - discuss with Dr. Criss Rosales to increase aricept from 5mg  to  10mg  as you have been tolerating it well.  - Follow up with your primary care physician for stroke risk factor modification. Recommend maintain blood pressure goal 130-140, diabetes with hemoglobin A1c goal below 6.5% and lipids with LDL cholesterol goal below 70 mg/dL.  - continue home exercise for left side mild weakness. - follow up with Dr. Letta Pate - follow up in 6 months.      Rosalin Hawking, MD PhD [facility] [address], [place], [place] [address] [phone]

## 2016-01-11 ENCOUNTER — Other Ambulatory Visit: Payer: Self-pay | Admitting: Physical Medicine & Rehabilitation

## 2016-03-07 ENCOUNTER — Encounter: Payer: Medicaid Other | Attending: Physical Medicine & Rehabilitation

## 2016-03-07 ENCOUNTER — Ambulatory Visit (HOSPITAL_BASED_OUTPATIENT_CLINIC_OR_DEPARTMENT_OTHER): Payer: Medicaid Other | Admitting: Physical Medicine & Rehabilitation

## 2016-03-07 ENCOUNTER — Encounter: Payer: Self-pay | Admitting: Physical Medicine & Rehabilitation

## 2016-03-07 VITALS — BP 136/89 | HR 56 | Resp 14

## 2016-03-07 DIAGNOSIS — I6931 Attention and concentration deficit following cerebral infarction: Secondary | ICD-10-CM | POA: Insufficient documentation

## 2016-03-07 DIAGNOSIS — R414 Neurologic neglect syndrome: Secondary | ICD-10-CM | POA: Diagnosis not present

## 2016-03-07 DIAGNOSIS — G8110 Spastic hemiplegia affecting unspecified side: Secondary | ICD-10-CM | POA: Diagnosis not present

## 2016-03-07 DIAGNOSIS — I63511 Cerebral infarction due to unspecified occlusion or stenosis of right middle cerebral artery: Secondary | ICD-10-CM | POA: Diagnosis not present

## 2016-03-07 NOTE — Progress Notes (Signed)
Botox Injection for spasticity using needle EMG guidance  Dilution: 50 Units/ml Indication: Severe spasticity which interferes with ADL,mobility and/or  hygiene and is unresponsive to medication management and other conservative care Informed consent was obtained after describing risks and benefits of the procedure with the patient. This includes bleeding, bruising, infection, excessive weakness, or medication side effects. A REMS form is on file and signed. Needle: 27g 1 " needle electrode Number of units per muscle Pectoralis200   All injections were done after obtaining appropriate EMG activity and after negative drawback for blood. The patient tolerated the procedure well. Post procedure instructions were given. A followup appointment was made.

## 2016-03-07 NOTE — Patient Instructions (Signed)

## 2016-04-20 ENCOUNTER — Ambulatory Visit (HOSPITAL_BASED_OUTPATIENT_CLINIC_OR_DEPARTMENT_OTHER): Payer: Self-pay | Admitting: Physical Medicine & Rehabilitation

## 2016-04-20 ENCOUNTER — Encounter: Payer: Medicaid Other | Attending: Physical Medicine & Rehabilitation

## 2016-04-20 ENCOUNTER — Encounter: Payer: Self-pay | Admitting: Physical Medicine & Rehabilitation

## 2016-04-20 VITALS — BP 129/98 | HR 74

## 2016-04-20 DIAGNOSIS — I63511 Cerebral infarction due to unspecified occlusion or stenosis of right middle cerebral artery: Secondary | ICD-10-CM | POA: Insufficient documentation

## 2016-04-20 DIAGNOSIS — I6931 Attention and concentration deficit following cerebral infarction: Secondary | ICD-10-CM | POA: Diagnosis not present

## 2016-04-20 DIAGNOSIS — R414 Neurologic neglect syndrome: Secondary | ICD-10-CM | POA: Insufficient documentation

## 2016-04-20 DIAGNOSIS — G8110 Spastic hemiplegia affecting unspecified side: Secondary | ICD-10-CM | POA: Diagnosis present

## 2016-04-20 DIAGNOSIS — M7502 Adhesive capsulitis of left shoulder: Secondary | ICD-10-CM

## 2016-04-20 DIAGNOSIS — M7542 Impingement syndrome of left shoulder: Secondary | ICD-10-CM

## 2016-04-20 NOTE — Patient Instructions (Addendum)
Will do Left shoulder ultrasound next visit  I think the main issue with you shoulders related to your stroke. However, you may have also some rotator cuff issues as well

## 2016-04-20 NOTE — Progress Notes (Signed)
Subjective:    Patient ID: Samuel Shaw, male    DOB: 1961/04/22, 55 y.o.   MRN: [id number]  HPI 55 year old male with right MCA dissipation infarct with left hemiparesis. He has had chronic shoulder pain since onset of stroke. Patient has had increased tone at the pectoralis as well as his bicep. He has pain with abduction of the shoulder. He's had Botox injections of the pectoralis as well as a bicep with some minimal improvement. He is independent with his self-care and mobility. He uses a cane outside the home but within the home. He does not use any assistive device. He does not require an AFO.   Pain Inventory Average Pain 3 Pain Right Now 3 My pain is dull and aching  In the last 24 hours, has pain interfered with the following? General activity 0 Relation with others 0 Enjoyment of life 0 What TIME of day is your pain at its worst? varies Sleep (in general) Fair  Pain is worse with: unsure Pain improves with: injections Relief from Meds: no selection  Mobility walk with assistance use a cane  Function disabled: date disabled .  Neuro/Psych weakness trouble walking spasms  Prior Studies Any changes since last visit?  no  Physicians involved in your care Any changes since last visit?  no   Family History  Problem Relation Age of Onset  . Diabetes Mother    Social History   Social History  . Marital status: Married    Spouse name: [name]  . Number of children: 3  . Years of education: 2y coll   Occupational History  . Disabled      not working at this time   Social History Main Topics  . Smoking status: Former Smoker    Packs/day: 1.00    Years: 15.00    Types: Cigarettes    Quit date: 06/26/2014  . Smokeless tobacco: Never Used  . Alcohol use No  . Drug use: No  . Sexual activity: Not Asked   Other Topics Concern  . None   Social History Narrative   Caffeine.1-2 drinks a day .   History reviewed. No pertinent surgical  history. Past Medical History:  Diagnosis Date  . Hyperlipidemia   . Hypertension   . Stroke ([place])    BP (!) 129/98 (BP Location: Right Arm, Patient Position: Sitting, Cuff Size: Large)   Pulse 74   SpO2 97%   Opioid Risk Score:   Fall Risk Score:  `1  Depression screen PHQ 2/9  Depression screen [facility] 2/9 03/07/2016 03/15/2015 01/25/2015 10/05/2014  Decreased Interest 0 0 0 1  Down, Depressed, Hopeless 0 0 0 0  PHQ - 2 Score 0 0 0 1  Altered sleeping - - - 0  Tired, decreased energy - - - 0  Change in appetite - - - 0  Feeling bad or failure about yourself  - - - 0  Trouble concentrating - - - 0  Moving slowly or fidgety/restless - - - 1  Suicidal thoughts - - - 0  PHQ-9 Score - - - 2    Review of Systems  HENT: Negative.   Eyes: Negative.   Respiratory: Negative.   Cardiovascular: Negative.   Gastrointestinal: Negative.   Genitourinary: Negative.   Musculoskeletal: Positive for gait problem.  Skin: Negative.   Allergic/Immunologic: Negative.   Neurological: Positive for weakness.  Hematological: Negative.   Psychiatric/Behavioral: Negative.        Objective:   Physical Exam  Positive Hawkins test, positive empty can test. He does have pain with abduction starting at 90. He also has deltoid weakness as well as bicep, tricep, and grip strength weakness on the left side rated as 3 minus/5. His left lower extremity strength is 4/5 in hip flexor, knee extensor, ankle dorsiflexion. Right-sided strength is 5/5 A, she has no neck pain with cervical range of motion. No radiating shoulder pain. No reported numbness in the left shoulder area      Assessment & Plan:  1. Post stroke shoulder pain, multifactorial. He has limited range of motion adhesive capsulitis, tone related issues in the pectoral area as well as potentially rotator cuff impingement. We'll check ultrasound of the left shoulder. We'll be looking for tear of the supraspinatus, infraspinatus tendons, possibly  some subscapularis as well as bicipital tendon.  If this is negative, would consider orthopedic evaluation. However, as discussed with the patient and his wife is unlikely he would be a good surgical candidate given his history of hemiparesis, prior stroke, which would increase his surgical risk, as well as his inability to perform usual postoperative rehabilitation due to his hemiparesis

## 2016-05-19 ENCOUNTER — Encounter: Payer: Medicaid Other | Attending: Physical Medicine & Rehabilitation

## 2016-05-19 ENCOUNTER — Ambulatory Visit: Payer: Self-pay | Admitting: Physical Medicine & Rehabilitation

## 2016-05-19 DIAGNOSIS — I63511 Cerebral infarction due to unspecified occlusion or stenosis of right middle cerebral artery: Secondary | ICD-10-CM | POA: Insufficient documentation

## 2016-05-19 DIAGNOSIS — I6931 Attention and concentration deficit following cerebral infarction: Secondary | ICD-10-CM | POA: Insufficient documentation

## 2016-05-19 DIAGNOSIS — R414 Neurologic neglect syndrome: Secondary | ICD-10-CM | POA: Insufficient documentation

## 2016-05-19 DIAGNOSIS — G8110 Spastic hemiplegia affecting unspecified side: Secondary | ICD-10-CM | POA: Insufficient documentation

## 2016-06-09 ENCOUNTER — Encounter: Payer: Self-pay | Admitting: Physical Medicine & Rehabilitation

## 2016-06-09 ENCOUNTER — Encounter: Payer: Medicaid Other | Attending: Physical Medicine & Rehabilitation

## 2016-06-09 ENCOUNTER — Ambulatory Visit: Payer: Medicaid Other | Admitting: Physical Medicine & Rehabilitation

## 2016-06-09 VITALS — BP 128/90 | HR 59 | Resp 14

## 2016-06-09 DIAGNOSIS — R414 Neurologic neglect syndrome: Secondary | ICD-10-CM | POA: Insufficient documentation

## 2016-06-09 DIAGNOSIS — M25512 Pain in left shoulder: Principal | ICD-10-CM

## 2016-06-09 DIAGNOSIS — I63511 Cerebral infarction due to unspecified occlusion or stenosis of right middle cerebral artery: Secondary | ICD-10-CM | POA: Diagnosis not present

## 2016-06-09 DIAGNOSIS — G8929 Other chronic pain: Secondary | ICD-10-CM

## 2016-06-09 DIAGNOSIS — G8110 Spastic hemiplegia affecting unspecified side: Secondary | ICD-10-CM | POA: Diagnosis present

## 2016-06-09 DIAGNOSIS — I6931 Attention and concentration deficit following cerebral infarction: Secondary | ICD-10-CM | POA: Diagnosis not present

## 2016-06-09 NOTE — Progress Notes (Signed)
Complete ultrasound left shoulder Indication is chronic left shoulder pain post stroke  Patient in a seated position Linear probe 12 Hz was utilized  Bicipital groove. Short axis, bicipital tendon, 0.41 centimeters, fluid collection just lateral to tendon, Bicipital groove, long axis. Biceps tendon point 6 3 cm, surrounding fluid subdeltoid area 0.13 centimeters thickness  Subscapularis insertion, long axis view. Upon lesser tuberosity with arthropathy noted, articular surface large tear  Subscapularis tendon, short axis view irregularity along the lesser tuberosity arthropathy. Articular surface tear, corresponding to long axis view  Supraspinatus tendon , long axis view with anechoic area at the distal insertion as well as more proximally along the articular surface. Also noted is a 0.16 centimeter thickness subdeltoid bursa fluid collection  Supraspinatus tendon . Short axis view with anechoic area on articular surface, corresponding to areas seen on long axis view  Infra spinatus tendon, long axis view with bursal surface partial thickness tear  Infraspinatus tendon. Short axis view with corresponding bursal surface partial thickness tear, one aspect is large and extends down nearly to articular surface of tendon.  Left acromioclavicular joint, long axis view with joint irregularity of both the acromial and clavicular aspects. Capsule appears intact  Impression 1. Abnormal study 2. There is evidence of bicipital tendon tenosynovitis, but no evidence of tear 3.. Left subscapularis, chronic partial thickness tear with associated arthropathy of the lesser tuberosity . 4. Left supraspinatus partial tear, articular surface  5. Left infraspinatus tendon, large partial thickness tear distal aspect from bursal surface  6. Left acromioclavicular joint arthropathy.  Findings were discussed with patient as well as his wife via phone  Will schedule for suprascapular nerve block

## 2016-06-20 ENCOUNTER — Ambulatory Visit: Payer: Self-pay | Admitting: Physical Medicine & Rehabilitation

## 2016-06-20 ENCOUNTER — Ambulatory Visit: Payer: Self-pay

## 2016-07-11 ENCOUNTER — Ambulatory Visit: Payer: Medicaid Other | Admitting: Physical Medicine & Rehabilitation

## 2016-07-12 ENCOUNTER — Encounter: Payer: Self-pay | Admitting: Neurology

## 2016-07-12 ENCOUNTER — Ambulatory Visit (INDEPENDENT_AMBULATORY_CARE_PROVIDER_SITE_OTHER): Payer: Medicaid Other | Admitting: Neurology

## 2016-07-12 VITALS — BP 122/88 | HR 60 | Ht 74.0 in | Wt 184.8 lb

## 2016-07-12 DIAGNOSIS — I63231 Cerebral infarction due to unspecified occlusion or stenosis of right carotid arteries: Secondary | ICD-10-CM | POA: Diagnosis not present

## 2016-07-12 DIAGNOSIS — I69319 Unspecified symptoms and signs involving cognitive functions following cerebral infarction: Secondary | ICD-10-CM | POA: Diagnosis not present

## 2016-07-12 DIAGNOSIS — I69354 Hemiplegia and hemiparesis following cerebral infarction affecting left non-dominant side: Secondary | ICD-10-CM

## 2016-07-12 NOTE — Patient Instructions (Addendum)
-   continue ASA and lipitor for stroke prevention - check BP at home, BP goal 120-140. Avoid low BP due to right ICA occlusion - avoid dehydration and keep hydrated. - will send note to Dr. Criss Rosales to increase aricept from 5mg  to 10mg  as you have been tolerating it well.  - Follow up with your primary care physician for stroke risk factor modification. Recommend maintain blood pressure goal 130-140, diabetes with hemoglobin A1c goal below 6.5% and lipids with LDL cholesterol goal below 70 mg/dL.  - continue home exercise for left side mild weakness. - follow up with Dr. Letta Pate - follow up as needed.

## 2016-07-12 NOTE — Progress Notes (Signed)
STROKE NEUROLOGY FOLLOW UP NOTE  NAME: Samuel Shaw DOB: [birth year]  REASON FOR VISIT: stroke follow up HISTORY FROM: wife and chart  Today we had the pleasure of seeing Samuel Shaw in follow-up at our Neurology Clinic. Pt was accompanied by wife.   History Summary Samuel Shaw is an 56 y.o. male PMH of 2 TIAs 2 years ago, right carotid stenosis but no intervention, and current smoker was admitted on 07/06/14 for subacute CVA in late 06/2014 in [place] with progressive left-sided weakness since the discharge. These symptoms are similar to his stroke in December, the family just feels they are getting worse. A review of records from [place] are pertinent for a head CT showing an acute right basal ganglia infarct and he is on ASA 325mg  daily. MRI showed multifocal subacute to remote infarct at right MCA territory (no new infarct) and CTA neck showed right ICA occlusion, athero vs. Dissection. He was continued on discharged to [facility] .   Follow up 10/08/14 - the patient has been doing well. Discharged from [facility] on 07/29/14 and currently outpt PT/OT, 2 times per week. He made significant progress from rehab. Currently he is walking with cane and steady. His BP is 118/85 today, he is still on ASA and lipitor. He has quit smoking and on nicotine patch now.  02/08/15 follow up - pt has been doing well. Quit smoking completely. Had CTA head and neck did not show right ICA recannulization. TCD emboli detection negative and VMR showed bilateral changes. Has finished off PT/OT and also stopped BP meds due to low BP. Today 131/98 in clinic. He is not doing much exercise at home.   08/11/15 follow up - pt has been doing the same. Has short memory loss and was put on Aricept by PCP. BP 126/85. No recurrent stroke like symptoms. Still has left sided mild weakness, doing home exercise.   01/10/16 follow up - pt had one ER visit on 12/25/15 for prolonged HA, received HA cocktail and HA resolved. As per wife, he  has HA 1-2 per year and never lasted more than a day. This time, it lasted more than two days. It normally caused by dehydration and this time he was working in the heat and again did not drink enough water. He also followed with Dr. Rexene Alberts and ruled out OSA so far. He also follows with Dr. Read Drivers for botox for muscle spasm. In terms of left sided weakness, it is stable and not change much. Pt seems not exercise as he should be as per wife.   Interval History During the interval time, pt has been doing better. No more HA, he keeps self hydrated. His aricept still at 5mg , not increase to 10mg  yet. He follows with Dr. Letta Pate for botox injection and shoulder pain too. BP ok at home as per pt and today in clinic 122/88.   REVIEW OF SYSTEMS: Full 14 system review of systems performed and notable only for those listed below and in HPI above, all others are negative:  Constitutional:   Cardiovascular:  Ear/Nose/Throat:   Skin:  Eyes:   Respiratory:   Gastroitestinal:   Genitourinary:  Hematology/Lymphatic:   Endocrine:  Musculoskeletal:   Allergy/Immunology:   Neurological:  Psychiatric:  Sleep:   The following represents the patient's updated allergies and side effects list: No Known Allergies  The neurologically relevant items on the patient's problem list were reviewed on today's visit.  Neurologic Examination  A problem focused neurological exam (  12 or more points of the single system neurologic examination, vital signs counts as 1 point, cranial nerves count for 8 points) was performed.  Blood pressure 122/88, pulse 60, height 6\' 2"  (1.88 m), weight 184 lb 12.8 oz (83.8 kg).  General - Well nourished, well developed, in no apparent distress.  Ophthalmologic - Sharp disc margins OU.  Cardiovascular - Regular rate and rhythm with no murmur.  Mental Status -  Level of arousal and orientation to time, place, and person were intact. Language including expression, naming,  repetition, comprehension was assessed and found intact.  Cranial Nerves II - XII - II - Visual field intact OU. III, IV, VI - Extraocular movements intact. V - Facial sensation intact bilaterally. VII - mild left facial droop. VIII - Hearing & vestibular intact bilaterally. X - Palate elevates symmetrically. XI - Chin turning & shoulder shrug intact bilaterally. XII - Tongue protrusion intact.  Motor Strength - The patient's strength was LUE 5-/5 without pronator drift, LLE 4/5 proximal and 4+/5 distally.  Bulk was normal and fasciculations were absent.   Motor Tone - Muscle tone was assessed at the neck and appendages and was normal.  Reflexes - The patient's reflexes were normal in all extremities and he had no pathological reflexes.  Sensory - Light touch, temperature/pinprick were assessed and were normal.    Coordination - The patient had normal movements in the hands and feet with no ataxia or dysmetria.  Tremor was absent.  Gait and Station - walk with cane and mild hemiparetic gait on the left.  Data reviewed: I personally reviewed the images and agree with the radiology interpretations.  Dg Chest 2 View 07/06/2014 CLINICAL DATA: Increasing left-sided weakness. Previous stroke. Smoker. EXAM: CHEST 2 VIEW COMPARISON: 10/31/2008. FINDINGS: Normal sized heart. Clear lungs. Increased density overlying the posterior aspect of the lower thoracic spine. IMPRESSION: Possible mass overlying the posterior aspect of the lower thoracic spine. A chest CT with contrast is recommended. Electronically Signed By: Enrique Sack M.D.  On: 07/06/2014 15:54   Ct Head Wo Contrast 07/06/2014 No acute finding. Findings consistent with subacute to remote right MCA territory infarct. Chronic microvascular ischemic change also noted.   Mr Brain Wo Contrast 07/06/2014 Acute multifocal infarcts within RIGHT middle cerebral artery territory (including RIGHT basal ganglia). RIGHT anterior  occipital likely within RIGHT middle cerebral artery territory/watershed. Limited blood sensitive sequence without lobar hematoma. Mild white matter changes, independent of acute areas of ischemia suggest chronic small vessel ischemic disease.   Mr Jodene Nam Head/brain Wo Cm 07/06/2014 RIGHT internal carotid artery occlusion. Bilateral posterior communicating arteries and suspected tiny anterior communicating artery present. Diminutive RIGHT middle cerebral artery flow related enhancement. Recommend CT angiogram of the neck for further evaluation.   CT angio neck 07/07/2014 Right internal carotid artery is occluded 1 cm above its origin. Etiology indeterminate. Focal plaque proximal left internal carotid artery without measurable/significant stenosis. Mild narrowing proximal right vertebral artery. Slightly ectatic ascending thoracic aorta (3.4 cm). Surrounding incidental findings.  2D echo - Left ventricle: The cavity size was normal. Systolic function was normal. The estimated ejection fraction was in the range of 55% to 60%. Wall motion was normal; there were no regional wall motion abnormalities.  CTA head and neck 10/20/14 - Complete occlusion RIGHT internal carotid artery 1 cm above its origin. This likely relates either to dissection or focal atheromatous plaque. Reconstitution of the RIGHT anterior circulation intracranially via collateral flow No acute findings. Chronic RIGHT hemisphere MCA territory infarction.  TCD MES -  Negative for 27min  TCD VMR - decreased reserve bilaterally  Component     Latest Ref Rng 07/07/2014  Cholesterol     0 - 200 mg/dL 182  Triglycerides     <150 mg/dL 116  HDL     >39 mg/dL 24 (L)  Total CHOL/HDL Ratio      7.6  VLDL     0 - 40 mg/dL 23  LDL (calc)     0 - 99 mg/dL 135 (H)  Hemoglobin A1C     <5.7 % 5.6  Mean Plasma Glucose     <117 mg/dL 114  TSH     0.350 - 4.500 uIU/mL 0.473    Assessment: As you may recall, he is a 55 y.o.  African American male with PMH of TIA, right carotid stenosis without intervention, smoker was admitted due to right MCA territory stroke. Found to have right ICA occlusion. He seems to have TIAs in 2010 and found to have right ICA stenosis at that time but no intervention done. His ICA occlusion likely due to athero, but dissection is also possibility as left ICA was smooth. Repeat CTA head and neck showed no recannulization. TCD MES negative. Has quit smoking. Stable mild left sided hemiparesis. BP stable off BP meds. Following with Dr. Letta Pate for botox injection. Sleep study did not show significant OSA. ER visit in 12/2015 for HA likely due to dehydration. After keep hydrated, his HA resolved.   Plan:  - continue ASA and lipitor for stroke prevention - check BP at home, BP goal 120-140. Avoid low BP due to right ICA occlusion - avoid dehydration and keep hydrated. - will send note to Dr. Criss Rosales to increase aricept from 5mg  to 10mg  as you have been tolerating it well.  - Follow up with your primary care physician for stroke risk factor modification. Recommend maintain blood pressure goal 130-140, diabetes with hemoglobin A1c goal below 6.5% and lipids with LDL cholesterol goal below 70 mg/dL.  - continue home exercise for left side mild weakness. - follow up with Dr. Letta Pate - follow up as needed.    No orders of the defined types were placed in this encounter.   No orders of the defined types were placed in this encounter.   Patient Instructions  - continue ASA and lipitor for stroke prevention - check BP at home, BP goal 120-140. Avoid low BP due to right ICA occlusion - avoid dehydration and keep hydrated. - will send note to Dr. Criss Rosales to increase aricept from 5mg  to 10mg  as you have been tolerating it well.  - Follow up with your primary care physician for stroke risk factor modification. Recommend maintain blood pressure goal 130-140, diabetes with hemoglobin A1c goal below 6.5%  and lipids with LDL cholesterol goal below 70 mg/dL.  - continue home exercise for left side mild weakness. - follow up with Dr. Letta Pate - follow up as needed.     Rosalin Hawking, MD PhD [facility] [address] [place], [place] [address] [phone]

## 2016-07-17 ENCOUNTER — Telehealth: Payer: Self-pay | Admitting: Neurology

## 2016-07-17 NOTE — Telephone Encounter (Signed)
Tried to call Dr. Madaline Savage call 4 times and no one pick up, also there is no voice mail. When a call is made the phone gets a busy signal at the end.

## 2016-07-17 NOTE — Telephone Encounter (Signed)
IF patients wife call pts PCP put him on aricept not Dr. Erlinda Hong. Dr. Erlinda Hong has never manage the aricept. Pt has no more follow up appts at [facility]. Rn call Dr. Erlinda Hong and spoke with him about the message from wife. Per Dr. Erlinda Hong he had put on the office note and recommend that PCP increase to 10mg  at night. Dr Erlinda Hong would like PCP to continue managing the arciept. Pt is to follow up with PCP ongoing.  Rn left vm for wife to call back for any questions.

## 2016-07-17 NOTE — Telephone Encounter (Signed)
Rn call patients wife to state the office notes were fax to the number listed on the office website.Marland Kitchen Pts wife will come by and have her husband  also sign a release form for last office notes.

## 2016-07-17 NOTE — Telephone Encounter (Signed)
When pt comes to office he needs to sign a release form to obtain Dr. Erlinda Hong last note. LAst OV is at the front desk in envelope.

## 2016-07-17 NOTE — Telephone Encounter (Signed)
Rn receive incoming call from patients wife. Rn stated in pts office note its recommend for Dr. Criss Rosales to increase the aricept. Pts PCP started him on aricept and has been doing the refills. Pt is stable with no more follow up appts. He is to follow up as needed.Rn stated Dr. Erlinda Hong office note will be fax to Dr. Criss Rosales office today. Pts wife verbalized understanding.

## 2016-07-17 NOTE — Telephone Encounter (Signed)
Patient's wife presented to the lobby stating Dr. Fransico Setters office stated Dr. Erlinda Hong will need to write a new order for donepezil HCl in order for him to take 2 tablets at bedtime. Best call back is [phone]

## 2016-07-25 ENCOUNTER — Encounter: Payer: Self-pay | Admitting: Physical Medicine & Rehabilitation

## 2016-07-25 ENCOUNTER — Encounter (HOSPITAL_COMMUNITY): Payer: Self-pay | Admitting: Emergency Medicine

## 2016-07-25 ENCOUNTER — Ambulatory Visit (HOSPITAL_BASED_OUTPATIENT_CLINIC_OR_DEPARTMENT_OTHER): Payer: Medicaid Other | Admitting: Physical Medicine & Rehabilitation

## 2016-07-25 ENCOUNTER — Encounter: Payer: Medicaid Other | Attending: Physical Medicine & Rehabilitation

## 2016-07-25 VITALS — BP 150/102 | HR 78

## 2016-07-25 DIAGNOSIS — Z8673 Personal history of transient ischemic attack (TIA), and cerebral infarction without residual deficits: Secondary | ICD-10-CM | POA: Insufficient documentation

## 2016-07-25 DIAGNOSIS — I63511 Cerebral infarction due to unspecified occlusion or stenosis of right middle cerebral artery: Secondary | ICD-10-CM | POA: Insufficient documentation

## 2016-07-25 DIAGNOSIS — G8110 Spastic hemiplegia affecting unspecified side: Secondary | ICD-10-CM | POA: Diagnosis present

## 2016-07-25 DIAGNOSIS — R4182 Altered mental status, unspecified: Secondary | ICD-10-CM | POA: Insufficient documentation

## 2016-07-25 DIAGNOSIS — I6931 Attention and concentration deficit following cerebral infarction: Secondary | ICD-10-CM | POA: Diagnosis not present

## 2016-07-25 DIAGNOSIS — M25512 Pain in left shoulder: Secondary | ICD-10-CM | POA: Diagnosis not present

## 2016-07-25 DIAGNOSIS — Z87891 Personal history of nicotine dependence: Secondary | ICD-10-CM | POA: Insufficient documentation

## 2016-07-25 DIAGNOSIS — Z7982 Long term (current) use of aspirin: Secondary | ICD-10-CM | POA: Diagnosis not present

## 2016-07-25 DIAGNOSIS — G5692 Unspecified mononeuropathy of left upper limb: Secondary | ICD-10-CM | POA: Diagnosis not present

## 2016-07-25 DIAGNOSIS — R51 Headache: Secondary | ICD-10-CM | POA: Diagnosis present

## 2016-07-25 DIAGNOSIS — R414 Neurologic neglect syndrome: Secondary | ICD-10-CM | POA: Insufficient documentation

## 2016-07-25 DIAGNOSIS — Z79899 Other long term (current) drug therapy: Secondary | ICD-10-CM | POA: Insufficient documentation

## 2016-07-25 DIAGNOSIS — R791 Abnormal coagulation profile: Secondary | ICD-10-CM | POA: Insufficient documentation

## 2016-07-25 DIAGNOSIS — I10 Essential (primary) hypertension: Secondary | ICD-10-CM | POA: Insufficient documentation

## 2016-07-25 DIAGNOSIS — M792 Neuralgia and neuritis, unspecified: Secondary | ICD-10-CM | POA: Insufficient documentation

## 2016-07-25 LAB — CBC WITH DIFFERENTIAL/PLATELET
Basophils Absolute: 0 10*3/uL (ref 0.0–0.1)
Basophils Relative: 0 %
EOS ABS: 0 10*3/uL (ref 0.0–0.7)
Eosinophils Relative: 0 %
HEMATOCRIT: 45.7 % (ref 39.0–52.0)
HEMOGLOBIN: 16.7 g/dL (ref 13.0–17.0)
LYMPHS ABS: 2.4 10*3/uL (ref 0.7–4.0)
Lymphocytes Relative: 42 %
MCH: 32.2 pg (ref 26.0–34.0)
MCHC: 36.5 g/dL — AB (ref 30.0–36.0)
MCV: 88.2 fL (ref 78.0–100.0)
MONOS PCT: 2 %
Monocytes Absolute: 0.1 10*3/uL (ref 0.1–1.0)
NEUTROS ABS: 3.2 10*3/uL (ref 1.7–7.7)
Neutrophils Relative %: 56 %
Platelets: 198 10*3/uL (ref 150–400)
RBC: 5.18 MIL/uL (ref 4.22–5.81)
RDW: 13.8 % (ref 11.5–15.5)
WBC: 5.7 10*3/uL (ref 4.0–10.5)

## 2016-07-25 LAB — COMPREHENSIVE METABOLIC PANEL
ALK PHOS: 55 U/L (ref 38–126)
ALT: 26 U/L (ref 17–63)
AST: 35 U/L (ref 15–41)
Albumin: 4.7 g/dL (ref 3.5–5.0)
Anion gap: 11 (ref 5–15)
BUN: 11 mg/dL (ref 6–20)
CALCIUM: 10 mg/dL (ref 8.9–10.3)
CO2: 21 mmol/L — ABNORMAL LOW (ref 22–32)
Chloride: 105 mmol/L (ref 101–111)
Creatinine, Ser: 1.01 mg/dL (ref 0.61–1.24)
GFR calc non Af Amer: >60 mL/min (ref >60)
Glucose, Bld: 243 mg/dL — ABNORMAL HIGH (ref 65–99)
Potassium: 4.6 mmol/L (ref 3.5–5.1)
Sodium: 137 mmol/L (ref 135–145)
Total Bilirubin: 1.9 mg/dL — ABNORMAL HIGH (ref 0.3–1.2)
Total Protein: 7.2 g/dL (ref 6.5–8.1)

## 2016-07-25 NOTE — Progress Notes (Signed)
Suprascapular nerve block under ultrasound guidance Indication shoulder pain which is not relieved by analgesic medications or other conservative care. Pain is severe and interferes with functional activities. Informed consent was obtained after describing risks and benefits of the procedure with patient these include bleeding bruising and infection, the patient elected to proceed and has given written consent. Patient placed in a seated position with ipsilateral hand on opposite shoulder. Linear transducer placed along the spine of the scapula and moved superiorly into the suprascapular fossa Area marked and prepped with Betadine 25-gauge 1.5 inch needle inserted, 2 cc of .5% marcaine infiltrated 22-gauge 50 mm echo block needle inserted under direct ultrasound visualization. Suprascapular not identified. Doppler used to identify blood vessels. Needle inserted into the notch. One ML Celestone 6 mg/ml and 4 ML 0.25% bupivacaine  Injected  Patient tolerated procedure well. Post procedure instructions given Return to clinic one month   

## 2016-07-25 NOTE — Patient Instructions (Signed)
Left suprascapular nerve block was performed. Patient is allowed to do all activities. Please ice if there is soreness in the area of the injection site. This should improve in 2-3 days. If not, please call office.

## 2016-07-25 NOTE — ED Triage Notes (Signed)
Pt. reports chronic/intermittent headache with diaphoresis for several years , he did not take any pain medications today , denies nausea or blurred vision .

## 2016-07-26 ENCOUNTER — Emergency Department (HOSPITAL_COMMUNITY): Payer: Medicaid Other

## 2016-07-26 ENCOUNTER — Emergency Department (HOSPITAL_COMMUNITY)
Admission: EM | Admit: 2016-07-26 | Discharge: 2016-07-26 | Disposition: A | Discharge status: Home / Self Care | Payer: Medicaid Other | Attending: Emergency Medicine | Admitting: Emergency Medicine

## 2016-07-26 DIAGNOSIS — R51 Headache: Secondary | ICD-10-CM

## 2016-07-26 DIAGNOSIS — R519 Headache, unspecified: Secondary | ICD-10-CM

## 2016-07-26 DIAGNOSIS — R4182 Altered mental status, unspecified: Secondary | ICD-10-CM

## 2016-07-26 LAB — I-STAT TROPONIN, ED: Troponin i, poc: 0 ng/mL (ref 0.00–0.08)

## 2016-07-26 LAB — PROTIME-INR
INR: 1.07
Prothrombin Time: 13.9 seconds (ref 11.4–15.2)

## 2016-07-26 LAB — APTT: APTT: 27 s (ref 24–36)

## 2016-07-26 MED ORDER — SODIUM CHLORIDE 0.9 % IV BOLUS (SEPSIS)
500.0000 mL | Freq: Once | INTRAVENOUS | Status: AC
  Administered 2016-07-26: 500 mL via INTRAVENOUS

## 2016-07-26 NOTE — Discharge Instructions (Signed)
You have been evaluated for your headache.  Take tylenol as needed for headache. Your blood sugar is high today, please follow up with your doctor for further evaluation for potential diabetes.  There are no evidence of new stroke causing your symptoms.  Return to the ER if you have any concerns.

## 2016-07-26 NOTE — ED Provider Notes (Signed)
[place] DEPT Provider Note   CSN: [id number] Arrival date & time: 07/25/16  2247     History   Chief Complaint Chief Complaint  Patient presents with  . Headache    HPI JHAIDEN OLAFSON is a 56 y.o. male with a hx of HLD, HTN, CVA (with residual L sided deficits),right carotid stenosis, chronic left shoulder pain presents to the Emergency Department complaining of gradual, persistent, progressively worsening AMS onset 3 days ago.  Wife describes flat affect.  She reports he has been more somnolent as well.  Last night pt has an episode where he called to her and she found him profusely diaphoretic.  At that time he was c/o headache and nausea.  She reports questionable amnesia of the event.  Pt reports he remembers, but is unable to recount the details.  Wife denies seeing seizure activity.  Pt was seen by his Ortho yesterday and given an injection of the left shoulder.  No additional symptoms.  No known aggravating or alleviating factors.  Pt denies fever, chills, cough, congestion, abd pain, N/V/D, new weakness known syncope.       The history is provided by the patient, medical records and the spouse. No language interpreter was used.    Past Medical History:  Diagnosis Date  . Hyperlipidemia   . Hypertension   . Stroke [facility])     Patient Active Problem List   Diagnosis Date Noted  . Neuropathic pain of left shoulder 07/25/2016  . Hemiparesis affecting left side as late effect of stroke ([place]) 01/10/2016  . Hypersomnia, unspecified 01/10/2016  . Cephalalgia 01/10/2016  . OSA (obstructive sleep apnea) 11/10/2015  . Residual cognitive deficit as late effect of stroke 11/05/2015  . Cerebral infarction due to occlusion of right carotid artery ([place]) 08/11/2015  . Adhesive capsulitis of right shoulder 06/07/2015  . Adhesive capsulitis of left shoulder 05/13/2015  . 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 Cerebral infarction due to embolism of right carotid artery ([place])  02/09/2015  . Carotid artery occlusion with infarction ([place]) 10/08/2014  . Essential hypertension 10/08/2014  . Hemi-neglect of left side 07/16/2014  . Spastic hemiplegia affecting nondominant side ([place]) 07/10/2014  . Cognitive deficit, post-stroke 07/10/2014  . Sinus bradycardia on ECG 07/10/2014  . Acute ischemic right middle cerebral artery (MCA) stroke ([place]) 07/08/2014  . CVA (cerebral vascular accident) ([place])   . Tobacco abuse   . HLD (hyperlipidemia)   . CVA (cerebral infarction) 07/06/2014    History reviewed. No pertinent surgical history.     Home Medications    Prior to Admission medications   Medication Sig Start Date End Date Taking? Authorizing Provider  acetaminophen (TYLENOL) 325 MG tablet Take 2 tablets (650 mg total) by mouth every 6 (six) hours as needed for mild pain or headache. 07/08/14  Yes Albertine Patricia, MD  aspirin 325 MG tablet Take 1 tablet (325 mg total) by mouth daily. 07/29/14  Yes Daniel J Angiulli, PA-C  atorvastatin (LIPITOR) 20 MG tablet Take 1 tablet (20 mg total) by mouth daily at 6 PM. 07/29/14  Yes Daniel J Angiulli, PA-C  butalbital-acetaminophen-caffeine (FIORICET, ESGIC) 50-325-40 MG tablet TAKE 1 TO 2 TABLETS BY MOUTH EVERY 6 HOURS AS NEEDED FOR HEADACHE 12/25/15  Yes Historical Provider, MD  donepezil (ARICEPT) 5 MG tablet Take 10 mg by mouth at bedtime.  04/09/15  Yes Historical Provider, MD  fexofenadine (ALLEGRA) 30 MG tablet Take 60 mg by mouth every morning.    Yes Historical Provider, MD  folic acid (FOLVITE) A999333 MCG tablet Take 1 tablet (400 mcg total) by mouth daily. 07/29/14  Yes Daniel J Angiulli, PA-C  gabapentin (NEURONTIN) 300 MG capsule take 1 capsule BY MOUTH THREE TIMES DAILY 01/11/16  Yes Charlett Blake, MD  promethazine (PHENERGAN) 25 MG tablet TAKE 1 TABLET BY MOUTH EVERY 6 HOURS AS NEEDED FOR NAUSEA OR VOMITING 12/25/15  Yes Historical Provider, MD  Thiamine HCl (VITAMIN B-1) 250 MG tablet Take 250 mg by mouth daily.   Yes  Historical Provider, MD  tiZANidine (ZANAFLEX) 4 MG tablet Take 4 mg by mouth 3 (three) times daily.   Yes Historical Provider, MD  traMADol (ULTRAM) 50 MG tablet TAKE 1 TABLET BY MOUTH TWICE DAILY 03/30/15  Yes Charlett Blake, MD  traZODone (DESYREL) 50 MG tablet Take 50 mg by mouth at bedtime. 03/04/15  Yes Historical Provider, MD    Family History Family History  Problem Relation Age of Onset  . Diabetes Mother     Social History Social History  Substance Use Topics  . Smoking status: Former Smoker    Packs/day: 1.00    Years: 15.00    Types: Cigarettes    Quit date: 06/26/2014  . Smokeless tobacco: Never Used  . Alcohol use No     Allergies   Patient has no known allergies.   Review of Systems Review of Systems  Constitutional: Positive for diaphoresis.  Neurological: Positive for headaches.  Psychiatric/Behavioral: Positive for confusion.  All other systems reviewed and are negative.    Physical Exam Updated Vital Signs BP (!) 158/101   Pulse (!) 52   Temp 97.4 F (36.3 C) (Oral)   Resp 18   SpO2 94%   Physical Exam  Constitutional: He is oriented to person, place, and time. He appears well-developed and well-nourished. No distress.  HENT:  Head: Normocephalic and atraumatic.  Mouth/Throat: Oropharynx is clear and moist.  Eyes: Conjunctivae and EOM are normal. Pupils are equal, round, and reactive to light. No scleral icterus.  No horizontal, vertical or rotational nystagmus  Neck: Normal range of motion. Neck supple.  Full active and passive ROM without pain No midline or paraspinal tenderness No nuchal rigidity or meningeal signs  Cardiovascular: Regular rhythm and intact distal pulses.  Bradycardia present.   Pulses:      Radial pulses are 2+ on the right side, and 2+ on the left side.  Pulmonary/Chest: Effort normal and breath sounds normal. No respiratory distress. He has no wheezes. He has no rales.  Abdominal: Soft. Bowel sounds are normal.  There is no tenderness. There is no rebound and no guarding.  Musculoskeletal: Normal range of motion.  Lymphadenopathy:    He has no cervical adenopathy.  Neurological: He is alert and oriented to person, place, and time. No cranial nerve deficit. He exhibits normal muscle tone. Coordination normal.  Mental Status:  Alert, oriented, thought content appropriate. No aphasia. Flat affect. Able to follow 2 step commands without difficulty.  Cranial Nerves:  II:  pupils equal, round, reactive to light III,IV, VI: ptosis not present, extra-ocular motions intact bilaterally  V,VII: left sided facial droop (baseline) IX,X: midline uvula rise  XI: bilateral shoulder shrug equal XII: midline tongue extension  Motor:  4/5 in left upper and lower extremities including grip strength and dorsiflexion/plantar flexion 5/5 in right upper and lower extremities including grip strength and dorsiflexion/plantar flexion Sensory: light touch normal in all extremities.  Cerebellar: normal finger-to-nose with bilateral upper extremities Gait: gait testing defferred  CV: distal pulses palpable throughout   Skin: Skin is warm and dry. No rash noted. He is not diaphoretic.  Psychiatric: He has a normal mood and affect. His behavior is normal. Judgment and thought content normal.  Nursing note and vitals reviewed.    ED Treatments / Results  Labs (all labs ordered are listed, but only abnormal results are displayed) Labs Reviewed  CBC WITH DIFFERENTIAL/PLATELET - Abnormal; Notable for the following:       Result Value   MCHC 36.5 (*)    All other components within normal limits  COMPREHENSIVE METABOLIC PANEL - Abnormal; Notable for the following:    CO2 21 (*)    Glucose, Bld 243 (*)    Total Bilirubin 1.9 (*)    All other components within normal limits  PROTIME-INR  APTT  ETHANOL  RAPID URINE DRUG SCREEN, HOSP PERFORMED  URINALYSIS, ROUTINE W REFLEX MICROSCOPIC  I-STAT TROPOININ, ED    EKG  EKG  Interpretation  Date/Time:  Wednesday July 26 2016 04:58:09 EST Ventricular Rate:  54 PR Interval:    QRS Duration: 88 QT Interval:  417 QTC Calculation: 396 R Axis:   29 Text Interpretation:  Sinus rhythm Atrial premature complex Probable left atrial enlargement ST elevation, consider inferior injury When compared with ECG of 07/06/2014, No significant change was found Confirmed by [facility]  MD, DAVID ([id number]) on 07/26/2016 5:16:46 AM       Procedures Procedures (including critical care time)  Medications Ordered in ED Medications  sodium chloride 0.9 % bolus 500 mL (500 mLs Intravenous New Bag/Given 07/26/16 0557)     Initial Impression / Assessment and Plan / ED Course  I have reviewed the triage vital signs and the nursing notes.  Pertinent labs & imaging results that were available during my care of the patient were reviewed by me and considered in my medical decision making (see chart for details).  Clinical Course as of Jul 26 617  Wed Jul 26, 2016  0602 The patient was discussed with and seen by Dr. Roxanne Mins who agrees with the treatment plan for MRI and possible admission for AMS.  [HM]  0618 At shift change care was transferred to Domenic Moras, PA-C who will follow pending studies, re-evaulate and determine disposition.   [HM]    Clinical Course User Index [HM] Abigail Butts, PA-C    Patient presents with altered mental status for several days.  Headache tonight and some confusion. Concern for possible new CVA. Labs reassuring. CT scan pending. Patient will also need MRI and likely admission.  Final Clinical Impressions(s) / ED Diagnoses   Final diagnoses:  Bad headache  Altered mental status, unspecified altered mental status type    New Prescriptions New Prescriptions   No medications on file     Abigail Butts, PA-C [id number]    David Glick, MD [id number]

## 2016-07-26 NOTE — ED Provider Notes (Signed)
Received patient at sign out from [facility], PA-C. Patient hasn't been his normal self for the past 4 days. Previous history of prior stroke with left hemiparalysis. Workup today is remarkable for mild hyperglycemia with a CBG of 247. Otherwise the remainder of his labs are reassuring. No infectious symptoms. A head CT scan as well as a brain MRI was obtained without any acute abnormalities. Patient does not have any history of diabetes. I encouraged patient to follow-up closely with primary care provider for further management of his condition. Return precaution discussed.  BP (!) 160/116   Pulse 63   Temp 97.4 F (36.3 C)   Resp 16   SpO2 97%   Results for orders placed or performed during the hospital encounter of 07/26/16  CBC with Differential  Result Value Ref Range   WBC 5.7 4.0 - 10.5 K/uL   RBC 5.18 4.22 - 5.81 MIL/uL   Hemoglobin 16.7 13.0 - 17.0 g/dL   HCT 45.7 39.0 - 52.0 %   MCV 88.2 78.0 - 100.0 fL   MCH 32.2 26.0 - 34.0 pg   MCHC 36.5 (H) 30.0 - 36.0 g/dL   RDW 13.8 11.5 - 15.5 %   Platelets 198 150 - 400 K/uL   Neutrophils Relative % 56 %   Neutro Abs 3.2 1.7 - 7.7 K/uL   Lymphocytes Relative 42 %   Lymphs Abs 2.4 0.7 - 4.0 K/uL   Monocytes Relative 2 %   Monocytes Absolute 0.1 0.1 - 1.0 K/uL   Eosinophils Relative 0 %   Eosinophils Absolute 0.0 0.0 - 0.7 K/uL   Basophils Relative 0 %   Basophils Absolute 0.0 0.0 - 0.1 K/uL  Comprehensive metabolic panel  Result Value Ref Range   Sodium 137 135 - 145 mmol/L   Potassium 4.6 3.5 - 5.1 mmol/L   Chloride 105 101 - 111 mmol/L   CO2 21 (L) 22 - 32 mmol/L   Glucose, Bld 243 (H) 65 - 99 mg/dL   BUN 11 6 - 20 mg/dL   Creatinine, Ser 1.01 0.61 - 1.24 mg/dL   Calcium 10.0 8.9 - 10.3 mg/dL   Total Protein 7.2 6.5 - 8.1 g/dL   Albumin 4.7 3.5 - 5.0 g/dL   AST 35 15 - 41 U/L   ALT 26 17 - 63 U/L   Alkaline Phosphatase 55 38 - 126 U/L   Total Bilirubin 1.9 (H) 0.3 - 1.2 mg/dL   GFR calc non Af Amer >60 >60  mL/min   GFR calc Af Amer >60 >60 mL/min   Anion gap 11 5 - 15  Protime-INR  Result Value Ref Range   Prothrombin Time 13.9 11.4 - 15.2 seconds   INR 1.07   APTT  Result Value Ref Range   aPTT 27 24 - 36 seconds  I-stat troponin, ED (not at [facility])  Result Value Ref Range   Troponin i, poc 0.00 0.00 - 0.08 ng/mL   Comment 3           Ct Head Wo Contrast  Result Date: 07/26/2016 CLINICAL DATA:  56 year old male with headache, diaphoresis, and memory loss. EXAM: CT HEAD WITHOUT CONTRAST TECHNIQUE: Contiguous axial images were obtained from the base of the skull through the vertex without intravenous contrast. COMPARISON:  Head CT dated 10/20/2014 FINDINGS: Brain: The ventricles and sulci are appropriate in size for patient's age. Periventricular and deep white matter chronic microvascular ischemic changes noted. An area of old infarct and encephalomalacia noted in the  right frontal lobe. Small area of old infarct and encephalomalacia is also noted in the right occipital lobe. There is a small old lacunar infarct in the anterior limb of the right internal capsule. No acute intracranial hemorrhage. No mass effect or midline shift. No extra-axial fluid collection. Vascular: No hyperdense vessel or unexpected calcification. Skull: Normal. Negative for fracture or focal lesion. Sinuses/Orbits: No acute finding. Other: None IMPRESSION: No acute intracranial hemorrhage. Mild age-related atrophy. Areas of old infarct and encephalomalacia involving the right frontal and occipital lobes. If symptoms persist and there are no contraindications, MRI may provide better evaluation if clinically indicated. Electronically Signed   By: Anner Crete M.D.   On: 07/26/2016 06:24   Mr Brain Wo Contrast  Result Date: 07/26/2016 CLINICAL DATA:  Progressively worsening mental status over the last 3 days. EXAM: MRI HEAD WITHOUT CONTRAST TECHNIQUE: Multiplanar, multiecho pulse sequences of the brain and surrounding  structures were obtained without intravenous contrast. COMPARISON:  Head CT same day.  MRI 07/06/2016. FINDINGS: Brain: Diffusion imaging does not show any acute or subacute infarction. The brainstem is normal except for wallerian degeneration on the right. No cerebellar insult. Cerebral hemispheres show old infarctions in the right MCA territory which are progressed to atrophy, encephalomalacia and gliosis. This is most pronounced in the right posterior frontal region. Right basal ganglia also involved. Left hemisphere is normal. Residual hemosiderin staining in the region of the old right-sided infarctions. No sign of acute hemorrhage, mass lesion or hydrocephalus. No pituitary mass Vascular: No flow seen in the right ICA. Left ICA shows flow. Posterior circulation shows flow. Skull and upper cervical spine: Normal Sinuses/Orbits: Clear/normal Other: None significant IMPRESSION: No acute finding. 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 Old infarctions within the right MCA territory. Electronically Signed   By: Nelson Chimes M.D.   On: 07/26/2016 07:30      Domenic Moras, PA-C 07/26/16 [id number]    Carmin Muskrat, MD 07/26/16 [phone]

## 2016-09-05 ENCOUNTER — Encounter: Payer: Medicaid Other | Attending: Physical Medicine & Rehabilitation

## 2016-09-05 ENCOUNTER — Encounter: Payer: Self-pay | Admitting: Physical Medicine & Rehabilitation

## 2016-09-05 ENCOUNTER — Ambulatory Visit (HOSPITAL_BASED_OUTPATIENT_CLINIC_OR_DEPARTMENT_OTHER): Payer: Medicaid Other | Admitting: Physical Medicine & Rehabilitation

## 2016-09-05 VITALS — BP 121/88 | HR 51

## 2016-09-05 DIAGNOSIS — G5692 Unspecified mononeuropathy of left upper limb: Secondary | ICD-10-CM | POA: Diagnosis not present

## 2016-09-05 DIAGNOSIS — G8110 Spastic hemiplegia affecting unspecified side: Secondary | ICD-10-CM

## 2016-09-05 DIAGNOSIS — I6931 Attention and concentration deficit following cerebral infarction: Secondary | ICD-10-CM | POA: Insufficient documentation

## 2016-09-05 DIAGNOSIS — M792 Neuralgia and neuritis, unspecified: Secondary | ICD-10-CM

## 2016-09-05 DIAGNOSIS — G890 Central pain syndrome: Secondary | ICD-10-CM | POA: Diagnosis not present

## 2016-09-05 DIAGNOSIS — I63511 Cerebral infarction due to unspecified occlusion or stenosis of right middle cerebral artery: Secondary | ICD-10-CM | POA: Insufficient documentation

## 2016-09-05 DIAGNOSIS — R414 Neurologic neglect syndrome: Secondary | ICD-10-CM | POA: Insufficient documentation

## 2016-09-05 NOTE — Progress Notes (Signed)
Subjective:    Patient ID: Samuel Shaw, male    DOB: 02-05-1961, 56 y.o.   MRN: [id number]  HPI Left suprascapular nerve block.  1/23  Helpful, less pain with movement, still unable to sleep on the left side.  [employer] uses rowing machine Treadmill Arm ergometer Also does weight machines    Pain Inventory Average Pain 4 Pain Right Now 0 My pain is .  In the last 24 hours, has pain interfered with the following? General activity 5 Relation with others 7 Enjoyment of life 7 What TIME of day is your pain at its worst? morning Sleep (in general) .  Pain is worse with: some activites Pain improves with: injections Relief from Meds: 5  Mobility use a cane use a wheelchair needs help with transfers  Function I need assistance with the following:  dressing, bathing, toileting, meal prep, household duties and shopping  Neuro/Psych weakness confusion  Prior Studies Any changes since last visit?  no  Physicians involved in your care Any changes since last visit?  no   Family History  Problem Relation Age of Onset  . Diabetes Mother    Social History   Social History  . Marital status: Married    Spouse name: Samuel Shaw  . Number of children: 3  . Years of education: 2y coll   Occupational History  . Disabled      not working at this time   Social History Main Topics  . Smoking status: Former Smoker    Packs/day: 1.00    Years: 15.00    Types: Cigarettes    Quit date: 06/26/2014  . Smokeless tobacco: Never Used  . Alcohol use No  . Drug use: No  . Sexual activity: Not on file   Other Topics Concern  . Not on file   Social History Narrative   Caffeine.1-2 drinks a day .   No past surgical history on file. Past Medical History:  Diagnosis Date  . Hyperlipidemia   . Hypertension   . Stroke [facility])    There were no vitals taken for this visit.  Opioid Risk Score:   Fall Risk Score:  `1  Depression screen PHQ 2/9  Depression screen [facility] 2/9  03/07/2016 03/15/2015 01/25/2015 10/05/2014  Decreased Interest 0 0 0 1  Down, Depressed, Hopeless 0 0 0 0  PHQ - 2 Score 0 0 0 1  Altered sleeping - - - 0  Tired, decreased energy - - - 0  Change in appetite - - - 0  Feeling bad or failure about yourself  - - - 0  Trouble concentrating - - - 0  Moving slowly or fidgety/restless - - - 1  Suicidal thoughts - - - 0  PHQ-9 Score - - - 2    Review of Systems  Constitutional: Negative.   HENT: Negative.   Eyes: Negative.   Respiratory: Negative.   Cardiovascular: Negative.   Gastrointestinal: Negative.   Endocrine: Positive for heat intolerance.  Genitourinary: Negative.   Musculoskeletal: Negative.   Skin: Negative.   Allergic/Immunologic: Negative.   Neurological: Negative.   Hematological: Negative.   Psychiatric/Behavioral: Negative.   All other systems reviewed and are negative.      Objective:   Physical Exam  Constitutional: He is oriented to person, place, and time. He appears well-developed and well-nourished.  HENT:  Head: Normocephalic and atraumatic.  Eyes: Conjunctivae and EOM are normal. Pupils are equal, round, and reactive to light.  Neurological: He is alert  and oriented to person, place, and time.  Psychiatric: He has a normal mood and affect.  Nursing note and vitals reviewed.  Motor strength is 5/5 in the right deltoid, biceps, triceps, grip 4/5 in the left biceps, triceps, grip, deltoid 4 minus   Has reduced external rotation of left shoulder pain at end range. Mild pain with overhead reaching        Assessment & Plan:  1. Multifactorial neuropathic shoulder pain, status post stroke with left hemiparesis. This is a combination of adhesive capsulitis as well as chronic rotator cuff tears. He has central sensitization as well. We discussed at the suprascapular nerve block has been effective, however, cannot predict duration of response. We will recheck in 3 months with schedule for possible repeat. If it  wears off before that time. He can repeat sooner  Discussed with patient and wife

## 2016-09-05 NOTE — Patient Instructions (Addendum)
Stay away from free weights  Do not do overhead press

## 2016-11-30 ENCOUNTER — Telehealth: Payer: Self-pay | Admitting: Physical Medicine & Rehabilitation

## 2016-11-30 NOTE — Telephone Encounter (Signed)
PHONED HOME LEFT MESS NEED COPY OF MCD CARD FOR PROCEDURE NEXT WEEK LLK

## 2016-12-07 ENCOUNTER — Ambulatory Visit (HOSPITAL_BASED_OUTPATIENT_CLINIC_OR_DEPARTMENT_OTHER): Payer: Medicare (Managed Care) | Admitting: Physical Medicine & Rehabilitation

## 2016-12-07 ENCOUNTER — Encounter: Payer: Medicare (Managed Care) | Attending: Physical Medicine & Rehabilitation

## 2016-12-07 ENCOUNTER — Encounter: Payer: Self-pay | Admitting: Physical Medicine & Rehabilitation

## 2016-12-07 VITALS — BP 135/100 | HR 64

## 2016-12-07 DIAGNOSIS — G5692 Unspecified mononeuropathy of left upper limb: Secondary | ICD-10-CM | POA: Diagnosis not present

## 2016-12-07 DIAGNOSIS — R414 Neurologic neglect syndrome: Secondary | ICD-10-CM | POA: Insufficient documentation

## 2016-12-07 DIAGNOSIS — G8110 Spastic hemiplegia affecting unspecified side: Secondary | ICD-10-CM | POA: Insufficient documentation

## 2016-12-07 DIAGNOSIS — M792 Neuralgia and neuritis, unspecified: Secondary | ICD-10-CM

## 2016-12-07 DIAGNOSIS — I63511 Cerebral infarction due to unspecified occlusion or stenosis of right middle cerebral artery: Secondary | ICD-10-CM | POA: Insufficient documentation

## 2016-12-07 DIAGNOSIS — I6931 Attention and concentration deficit following cerebral infarction: Secondary | ICD-10-CM | POA: Diagnosis not present

## 2016-12-07 NOTE — Progress Notes (Signed)
Suprascapular nerve block under ultrasound guidance Indication shoulder pain which is not relieved by analgesic medications or other conservative care. Pain is severe and interferes with functional activities. Informed consent was obtained after describing risks and benefits of the procedure with patient these include bleeding bruising and infection, the patient elected to proceed and has given written consent. Patient placed in a seated position with ipsilateral hand on opposite shoulder. Linear transducer placed along the spine of the scapula and moved superiorly into the suprascapular fossa Area marked and prepped with Betadine 25-gauge 1.5 inch needle inserted, 2 cc of .5% marcaine infiltrated 22-gauge 50 mm echo block needle inserted under direct ultrasound visualization. Suprascapular not identified. Doppler used to identify blood vessels. Needle inserted into the notch. One ML Celestone 6 mg/ml and 4 ML 0.25% bupivacaine  Injected  Patient tolerated procedure well. Post procedure instructions given Return to clinic one month

## 2016-12-07 NOTE — Patient Instructions (Signed)
Lef suprascapular nerve block, may be repeat as early as 3 mo if needed

## 2017-02-08 ENCOUNTER — Telehealth: Payer: Self-pay

## 2017-02-08 NOTE — Telephone Encounter (Signed)
Dr Jimmye Norman called about this patient, requesting information on when he was allowed to return to driving status due to traffic violations.  Called her back and left message stating to call us back again for the infromation   12-04-2014 was the date of the visit that stated that he may return to driving.

## 2017-02-08 NOTE — Telephone Encounter (Signed)
Called Dr. Redmond Pulling back and gave her information about the patients driving instructions

## 2017-02-26 ENCOUNTER — Other Ambulatory Visit: Payer: Self-pay | Admitting: Internal Medicine

## 2017-02-26 DIAGNOSIS — R911 Solitary pulmonary nodule: Secondary | ICD-10-CM

## 2017-03-02 ENCOUNTER — Ambulatory Visit
Admission: RE | Admit: 2017-03-02 | Discharge: 2017-03-02 | Disposition: A | Discharge status: Home / Self Care | Payer: Medicare (Managed Care) | Source: Ambulatory Visit | Attending: Internal Medicine | Admitting: Internal Medicine

## 2017-03-02 DIAGNOSIS — R911 Solitary pulmonary nodule: Secondary | ICD-10-CM

## 2017-03-09 ENCOUNTER — Ambulatory Visit (HOSPITAL_BASED_OUTPATIENT_CLINIC_OR_DEPARTMENT_OTHER): Payer: Medicare (Managed Care) | Admitting: Physical Medicine & Rehabilitation

## 2017-03-09 ENCOUNTER — Encounter: Payer: Medicare (Managed Care) | Attending: Physical Medicine & Rehabilitation

## 2017-03-09 ENCOUNTER — Encounter: Payer: Self-pay | Admitting: Physical Medicine & Rehabilitation

## 2017-03-09 VITALS — BP 144/99 | HR 59

## 2017-03-09 DIAGNOSIS — G8110 Spastic hemiplegia affecting unspecified side: Secondary | ICD-10-CM | POA: Insufficient documentation

## 2017-03-09 DIAGNOSIS — I69319 Unspecified symptoms and signs involving cognitive functions following cerebral infarction: Secondary | ICD-10-CM

## 2017-03-09 DIAGNOSIS — I69919 Unspecified symptoms and signs involving cognitive functions following unspecified cerebrovascular disease: Secondary | ICD-10-CM | POA: Diagnosis not present

## 2017-03-09 DIAGNOSIS — I6931 Attention and concentration deficit following cerebral infarction: Secondary | ICD-10-CM | POA: Insufficient documentation

## 2017-03-09 DIAGNOSIS — R414 Neurologic neglect syndrome: Secondary | ICD-10-CM | POA: Diagnosis not present

## 2017-03-09 DIAGNOSIS — I63511 Cerebral infarction due to unspecified occlusion or stenosis of right middle cerebral artery: Secondary | ICD-10-CM | POA: Diagnosis not present

## 2017-03-09 NOTE — Progress Notes (Signed)
Subjective:    Patient ID: Samuel Shaw, male    DOB: Nov 23, 1960, 56 y.o.   MRN: [id number]  HPI Here for left shoulder suprascapular nerve block, but no longer complaining of shoulder pain. He is now attending adult daycare where there is somebody helping him with range of motion exercises. Pain Inventory Average Pain 0 Pain Right Now 0 My pain is na  In the last 24 hours, has pain interfered with the following? General activity 1 Relation with others 5 Enjoyment of life 3 What TIME of day is your pain at its worst? night Sleep (in general) Fair  Pain is worse with: some activites Pain improves with: rest, therapy/exercise, medication and injections Relief from Meds: na  Mobility walk with assistance use a cane use a walker ability to climb steps?  yes do you drive?  yes  Function Do you have any goals in this area?  no  Neuro/Psych weakness tremor  Prior Studies Any changes since last visit?  no  Physicians involved in your care Any changes since last visit?  no   Family History  Problem Relation Age of Onset  . Diabetes Mother    Social History   Social History  . Marital status: Married    Spouse name: [name]  . Number of children: 3  . Years of education: 2y coll   Occupational History  . Disabled      not working at this time   Social History Main Topics  . Smoking status: Former Smoker    Packs/day: 1.00    Years: 15.00    Types: Cigarettes    Quit date: 06/26/2014  . Smokeless tobacco: Never Used  . Alcohol use No  . Drug use: No  . Sexual activity: Not on file   Other Topics Concern  . Not on file   Social History Narrative   Caffeine.1-2 drinks a day .   No past surgical history on file. Past Medical History:  Diagnosis Date  . Hyperlipidemia   . Hypertension   . Stroke [facility])    There were no vitals taken for this visit.  Opioid Risk Score:   Fall Risk Score:  `1  Depression screen PHQ 2/9  Depression screen [facility]  2/9 03/07/2016 03/15/2015 01/25/2015 10/05/2014  Decreased Interest 0 0 0 1  Down, Depressed, Hopeless 0 0 0 0  PHQ - 2 Score 0 0 0 1  Altered sleeping - - - 0  Tired, decreased energy - - - 0  Change in appetite - - - 0  Feeling bad or failure about yourself  - - - 0  Trouble concentrating - - - 0  Moving slowly or fidgety/restless - - - 1  Suicidal thoughts - - - 0  PHQ-9 Score - - - 2     Review of Systems  Constitutional: Positive for unexpected weight change.  HENT: Negative.   Eyes: Negative.   Respiratory: Negative.   Cardiovascular: Negative.   Gastrointestinal: Negative.   Endocrine: Negative.   Genitourinary: Negative.   Musculoskeletal: Negative.   Skin: Negative.   Allergic/Immunologic: Negative.   Neurological: Negative.   Hematological: Negative.   Psychiatric/Behavioral: Negative.   All other systems reviewed and are negative.      Objective:   Physical Exam  Constitutional: He is oriented to person, place, and time. He appears well-developed and well-nourished.  Musculoskeletal:       Left shoulder: He exhibits decreased range of motion. He exhibits no tenderness and  no spasm.  Neurological: He is alert and oriented to person, place, and time.  Psychiatric: He has a normal mood and affect.  Nursing note and vitals reviewed.  His motor strength is 4 minus at the left deltoid, limited by range of motion. 4 plus at the biceps, triceps and grip on the left side. 5/5 in the right deltoid, biceps, triceps grip  Patient has reduced external rotation and abduction of the left shoulder internal rotation is relatively preserved. Gait is with a left AFO. Mood and affect are appropriate. He appears brighter than on previous visits       Assessment & Plan:  1. Left shoulder adhesive capsulitis, he has improved range of motion, currently at adult day care where there is somebody that is helping him with range of motion exercises. We discussed that the suprascapular  nerve block is primarily for pain control and would not give him any additional benefit. Since his pain is not severe, would recommend holding off on repeat. We'll see him back in 3 months for recheck. I have given him additional shoulder exercises that he can do at the adult day care

## 2017-03-09 NOTE — Patient Instructions (Signed)
Shoulder Exercises Ask your health care provider which exercises are safe for you. Do exercises exactly as told by your health care provider and adjust them as directed. It is normal to feel mild stretching, pulling, tightness, or discomfort as you do these exercises, but you should stop right away if you feel sudden pain or your pain gets worse.Do not begin these exercises until told by your health care provider. RANGE OF MOTION EXERCISES These exercises warm up your muscles and joints and improve the movement and flexibility of your shoulder. These exercises also help to relieve pain, numbness, and tingling. These exercises involve stretching your injured shoulder directly. Exercise A: Pendulum  1. Stand near a wall or a surface that you can hold onto for balance. 2. Bend at the waist and let your left / right arm hang straight down. Use your other arm to support you. Keep your back straight and do not lock your knees. 3. Relax your left / right arm and shoulder muscles, and move your hips and your trunk so your left / right arm swings freely. Your arm should swing because of the motion of your body, not because you are using your arm or shoulder muscles. 4. Keep moving your body so your arm swings in the following directions, as told by your health care provider: ? Side to side. ? Forward and backward. ? In clockwise and counterclockwise circles. 5. Continue each motion for __________ seconds, or for as long as told by your health care provider. 6. Slowly return to the starting position. Repeat __________ times. Complete this exercise __________ times a day. Exercise B:Flexion, Standing  1. Stand and hold a broomstick, a cane, or a similar object. Place your hands a little more than shoulder-width apart on the object. Your left / right hand should be palm-up, and your other hand should be palm-down. 2. Keep your elbow straight and keep your shoulder muscles relaxed. Push the stick down with  your healthy arm to raise your left / right arm in front of your body, and then over your head until you feel a stretch in your shoulder. ? Avoid shrugging your shoulder while you raise your arm. Keep your shoulder blade tucked down toward the middle of your back. 3. Hold for __________ seconds. 4. Slowly return to the starting position. Repeat __________ times. Complete this exercise __________ times a day. Exercise C: Abduction, Standing 1. Stand and hold a broomstick, a cane, or a similar object. Place your hands a little more than shoulder-width apart on the object. Your left / right hand should be palm-up, and your other hand should be palm-down. 2. While keeping your elbow straight and your shoulder muscles relaxed, push the stick across your body toward your left / right side. Raise your left / right arm to the side of your body and then over your head until you feel a stretch in your shoulder. ? Do not raise your arm above shoulder height, unless your health care provider tells you to do that. ? Avoid shrugging your shoulder while you raise your arm. Keep your shoulder blade tucked down toward the middle of your back. 3. Hold for __________ seconds. 4. Slowly return to the starting position. Repeat __________ times. Complete this exercise __________ times a day. Exercise D:Internal Rotation  1. Place your left / right hand behind your back, palm-up. 2. Use your other hand to dangle an exercise band, a towel, or a similar object over your shoulder. Grasp the band with   your left / right hand so you are holding onto both ends. 3. Gently pull up on the band until you feel a stretch in the front of your left / right shoulder. ? Avoid shrugging your shoulder while you raise your arm. Keep your shoulder blade tucked down toward the middle of your back. 4. Hold for __________ seconds. 5. Release the stretch by letting go of the band and lowering your hands. Repeat __________ times. Complete  this exercise __________ times a day. STRETCHING EXERCISES These exercises warm up your muscles and joints and improve the movement and flexibility of your shoulder. These exercises also help to relieve pain, numbness, and tingling. These exercises are done using your healthy shoulder to help stretch the muscles of your injured shoulder. Exercise E: Corner Stretch (External Rotation and Abduction)  1. Stand in a doorway with one of your feet slightly in front of the other. This is called a staggered stance. If you cannot reach your forearms to the door frame, stand facing a corner of a room. 2. Choose one of the following positions as told by your health care provider: ? Place your hands and forearms on the door frame above your head. ? Place your hands and forearms on the door frame at the height of your head. ? Place your hands on the door frame at the height of your elbows. 3. Slowly move your weight onto your front foot until you feel a stretch across your chest and in the front of your shoulders. Keep your head and chest upright and keep your abdominal muscles tight. 4. Hold for __________ seconds. 5. To release the stretch, shift your weight to your back foot. Repeat __________ times. Complete this stretch __________ times a day. Exercise F:Extension, Standing 1. Stand and hold a broomstick, a cane, or a similar object behind your back. ? Your hands should be a little wider than shoulder-width apart. ? Your palms should face away from your back. 2. Keeping your elbows straight and keeping your shoulder muscles relaxed, move the stick away from your body until you feel a stretch in your shoulder. ? Avoid shrugging your shoulders while you move the stick. Keep your shoulder blade tucked down toward the middle of your back. 3. Hold for __________ seconds. 4. Slowly return to the starting position. Repeat __________ times. Complete this exercise __________ times a day. STRENGTHENING  EXERCISES These exercises build strength and endurance in your shoulder. Endurance is the ability to use your muscles for a long time, even after they get tired. Exercise G:External Rotation  1. Sit in a stable chair without armrests. 2. Secure an exercise band at elbow height on your left / right side. 3. Place a soft object, such as a folded towel or a small pillow, between your left / right upper arm and your body to move your elbow a few inches away (about 10 cm) from your side. 4. Hold the end of the band so it is tight and there is no slack. 5. Keeping your elbow pressed against the soft object, move your left / right forearm out, away from your abdomen. Keep your body steady so only your forearm moves. 6. Hold for __________ seconds. 7. Slowly return to the starting position. Repeat __________ times. Complete this exercise __________ times a day. Exercise H:Shoulder Abduction  1. Sit in a stable chair without armrests, or stand. 2. Hold a __________ weight in your left / right hand, or hold an exercise band with both hands.   3. Start with your arms straight down and your left / right palm facing in, toward your body. 4. Slowly lift your left / right hand out to your side. Do not lift your hand above shoulder height unless your health care provider tells you that this is safe. ? Keep your arms straight. ? Avoid shrugging your shoulder while you do this movement. Keep your shoulder blade tucked down toward the middle of your back. 5. Hold for __________ seconds. 6. Slowly lower your arm, and return to the starting position. Repeat __________ times. Complete this exercise __________ times a day. Exercise I:Shoulder Extension 1. Sit in a stable chair without armrests, or stand. 2. Secure an exercise band to a stable object in front of you where it is at shoulder height. 3. Hold one end of the exercise band in each hand. Your palms should face each other. 4. Straighten your elbows and  lift your hands up to shoulder height. 5. Step back, away from the secured end of the exercise band, until the band is tight and there is no slack. 6. Squeeze your shoulder blades together as you pull your hands down to the sides of your thighs. Stop when your hands are straight down by your sides. Do not let your hands go behind your body. 7. Hold for __________ seconds. 8. Slowly return to the starting position. Repeat __________ times. Complete this exercise __________ times a day. Exercise J:Standing Shoulder Row 1. Sit in a stable chair without armrests, or stand. 2. Secure an exercise band to a stable object in front of you so it is at waist height. 3. Hold one end of the exercise band in each hand. Your palms should be in a thumbs-up position. 4. Bend each of your elbows to an "L" shape (about 90 degrees) and keep your upper arms at your sides. 5. Step back until the band is tight and there is no slack. 6. Slowly pull your elbows back behind you. 7. Hold for __________ seconds. 8. Slowly return to the starting position. Repeat __________ times. Complete this exercise __________ times a day. Exercise K:Shoulder Press-Ups  1. Sit in a stable chair that has armrests. Sit upright, with your feet flat on the floor. 2. Put your hands on the armrests so your elbows are bent and your fingers are pointing forward. Your hands should be about even with the sides of your body. 3. Push down on the armrests and use your arms to lift yourself off of the chair. Straighten your elbows and lift yourself up as much as you comfortably can. ? Move your shoulder blades down, and avoid letting your shoulders move up toward your ears. ? Keep your feet on the ground. As you get stronger, your feet should support less of your body weight as you lift yourself up. 4. Hold for __________ seconds. 5. Slowly lower yourself back into the chair. Repeat __________ times. Complete this exercise __________ times a  day. Exercise L: Wall Push-Ups  1. Stand so you are facing a stable wall. Your feet should be about one arm-length away from the wall. 2. Lean forward and place your palms on the wall at shoulder height. 3. Keep your feet flat on the floor as you bend your elbows and lean forward toward the wall. 4. Hold for __________ seconds. 5. Straighten your elbows to push yourself back to the starting position. Repeat __________ times. Complete this exercise __________ times a day. This information is not intended to replace advice   given to you by your health care provider. Make sure you discuss any questions you have with your health care provider. Document Released: 05/03/2005 Document Revised: 03/13/2016 Document Reviewed: 02/28/2015 Elsevier Interactive Patient Education  2018 [employer]  

## 2017-05-01 ENCOUNTER — Ambulatory Visit (AMBULATORY_SURGERY_CENTER): Payer: Self-pay | Admitting: *Deleted

## 2017-05-01 VITALS — Ht 74.0 in | Wt 197.2 lb

## 2017-05-01 DIAGNOSIS — Z1211 Encounter for screening for malignant neoplasm of colon: Secondary | ICD-10-CM

## 2017-05-01 DIAGNOSIS — Z800 Family history of malignant neoplasm of digestive organs: Secondary | ICD-10-CM

## 2017-05-01 MED ORDER — NA SULFATE-K SULFATE-MG SULF 17.5-3.13-1.6 GM/177ML PO SOLN: 1.0000 [IU] | Freq: Once | ORAL | 0 refills | Status: AC

## 2017-05-01 NOTE — Progress Notes (Signed)
No egg or soy allergy known to patient  No issues with past sedation with any surgeries  or procedures, no intubation problems  No diet pills per patient No home 02 use per patient  No blood thinners per patient  Pt denies issues with constipation pt. denies No A fib or A flutter  EMMI video sent to pt's e mail

## 2017-05-14 ENCOUNTER — Ambulatory Visit (AMBULATORY_SURGERY_CENTER): Payer: Medicare (Managed Care) | Admitting: Gastroenterology

## 2017-05-14 ENCOUNTER — Encounter: Payer: Self-pay | Admitting: Gastroenterology

## 2017-05-14 ENCOUNTER — Other Ambulatory Visit: Payer: Self-pay

## 2017-05-14 VITALS — BP 128/86 | HR 59 | Temp 97.5°F | Resp 12 | Ht 74.0 in | Wt 197.0 lb

## 2017-05-14 DIAGNOSIS — D123 Benign neoplasm of transverse colon: Secondary | ICD-10-CM

## 2017-05-14 DIAGNOSIS — Z1211 Encounter for screening for malignant neoplasm of colon: Secondary | ICD-10-CM

## 2017-05-14 DIAGNOSIS — Z800 Family history of malignant neoplasm of digestive organs: Secondary | ICD-10-CM | POA: Diagnosis not present

## 2017-05-14 MED ORDER — SODIUM CHLORIDE 0.9 % IV SOLN: 500.0000 mL | INTRAVENOUS | Status: DC

## 2017-05-14 NOTE — Progress Notes (Signed)
To recovery, report to RN, VSS. 

## 2017-05-14 NOTE — Progress Notes (Signed)
Called to room to assist during endoscopic procedure.  Patient ID and intended procedure confirmed with present staff. Received instructions for my participation in the procedure from the performing physician.  

## 2017-05-14 NOTE — Progress Notes (Signed)
Pt. Reports no change in his medical or surgical history since pre-visit 05/01/2017.

## 2017-05-14 NOTE — Op Note (Signed)
[place] Patient Name: Samuel Shaw Procedure Date: 05/14/2017 11:06 AM MRN: [id number] Endoscopist: Mauri Pole , MD Age: 56 Referring MD:  Date of Birth: Dec 26, 1960 Gender: Male Account #: [id number] Procedure:                Colonoscopy Indications:              Screening patient at increased risk: Family history                            of 1st-degree relative with colorectal cancer at                            age 66 years (or older) (father had colon cancer in                            his 55's) Medicines:                Monitored Anesthesia Care Procedure:                Pre-Anesthesia Assessment:                           - Prior to the procedure, a History and Physical                            was performed, and patient medications and                            allergies were reviewed. The patient's tolerance of                            previous anesthesia was also reviewed. The risks                            and benefits of the procedure and the sedation                            options and risks were discussed with the patient.                            All questions were answered, and informed consent                            was obtained. Prior Anticoagulants: The patient has                            taken no previous anticoagulant or antiplatelet                            agents. ASA Grade Assessment: II - A patient with                            mild systemic disease. After reviewing the risks  and benefits, the patient was deemed in                            satisfactory condition to undergo the procedure.                           After obtaining informed consent, the colonoscope                            was passed under direct vision. Throughout the                            procedure, the patient's blood pressure, pulse, and                            oxygen saturations were monitored  continuously. The                            Colonoscope was introduced through the anus and                            advanced to the the terminal ileum, with                            identification of the appendiceal orifice and IC                            valve. The colonoscopy was performed without                            difficulty. The patient tolerated the procedure                            well. The quality of the bowel preparation was                            excellent. The terminal ileum, ileocecal valve,                            appendiceal orifice, and rectum were photographed. Scope In: 11:15:29 AM Scope Out: 11:28:20 AM Scope Withdrawal Time: 0 hours 9 minutes 43 seconds  Total Procedure Duration: 0 hours 12 minutes 51 seconds  Findings:                 The perianal and digital rectal examinations were                            normal.                           A 4 mm polyp was found in the transverse colon. The                            polyp was sessile. The polyp was removed with a  cold snare. Resection and retrieval were complete.                           A few small-mouthed diverticula were found in the                            sigmoid colon, descending colon, transverse colon                            and hepatic flexure. There was no evidence of                            diverticular bleeding.                           Non-bleeding internal hemorrhoids were found during                            retroflexion. The hemorrhoids were small.                           The exam was otherwise without abnormality. Complications:            No immediate complications. Estimated Blood Loss:     Estimated blood loss was minimal. Impression:               - One 4 mm polyp in the transverse colon, removed                            with a cold snare. Resected and retrieved.                           - Mild diverticulosis in the  sigmoid colon, in the                            descending colon, in the transverse colon and at                            the hepatic flexure. There was no evidence of                            diverticular bleeding.                           - Non-bleeding internal hemorrhoids.                           - The examination was otherwise normal. Recommendation:           - Patient has a contact number available for                            emergencies. The signs and symptoms of potential                            delayed complications were  discussed with the                            patient. Return to normal activities tomorrow.                            Written discharge instructions were provided to the                            patient.                           - Resume previous diet.                           - Continue present medications.                           - Await pathology results.                           - Repeat colonoscopy in 5 years for surveillance                            based on pathology results. Mauri Pole, MD 05/14/2017 11:35:18 AM This report has been signed electronically.

## 2017-05-14 NOTE — Patient Instructions (Signed)
Discharge instructions given. Handouts on polyps and hemorrhoids. Resume previous medications. YOU HAD AN ENDOSCOPIC PROCEDURE TODAY AT THE [facility]:   Refer to the procedure report that was given to you for any specific questions about what was found during the examination.  If the procedure report does not answer your questions, please call your gastroenterologist to clarify.  If you requested that your care partner not be given the details of your procedure findings, then the procedure report has been included in a sealed envelope for you to review at your convenience later.  YOU SHOULD EXPECT: Some feelings of bloating in the abdomen. Passage of more gas than usual.  Walking can help get rid of the air that was put into your GI tract during the procedure and reduce the bloating. If you had a lower endoscopy (such as a colonoscopy or flexible sigmoidoscopy) you may notice spotting of blood in your stool or on the toilet paper. If you underwent a bowel prep for your procedure, you may not have a normal bowel movement for a few days.  Please Note:  You might notice some irritation and congestion in your nose or some drainage.  This is from the oxygen used during your procedure.  There is no need for concern and it should clear up in a day or so.  SYMPTOMS TO REPORT IMMEDIATELY:   Following lower endoscopy (colonoscopy or flexible sigmoidoscopy):  Excessive amounts of blood in the stool  Significant tenderness or worsening of abdominal pains  Swelling of the abdomen that is new, acute  Fever of 100F or higher   For urgent or emergent issues, a gastroenterologist can be reached at any hour by calling [phone].   DIET:  We do recommend a small meal at first, but then you may proceed to your regular diet.  Drink plenty of fluids but you should avoid alcoholic beverages for 24 hours.  ACTIVITY:  You should plan to take it easy for the rest of today and you should NOT DRIVE  or use heavy machinery until tomorrow (because of the sedation medicines used during the test).    FOLLOW UP: Our staff will call the number listed on your records the next business day following your procedure to check on you and address any questions or concerns that you may have regarding the information given to you following your procedure. If we do not reach you, we will leave a message.  However, if you are feeling well and you are not experiencing any problems, there is no need to return our call.  We will assume that you have returned to your regular daily activities without incident.  If any biopsies were taken you will be contacted by phone or by letter within the next 1-3 weeks.  Please call us at [phone] if you have not heard about the biopsies in 3 weeks.    SIGNATURES/CONFIDENTIALITY: You and/or your care partner have signed paperwork which will be entered into your electronic medical record.  These signatures attest to the fact that that the information above on your After Visit Summary has been reviewed and is understood.  Full responsibility of the confidentiality of this discharge information lies with you and/or your care-partner. 

## 2017-05-15 ENCOUNTER — Telehealth: Payer: Self-pay | Admitting: *Deleted

## 2017-05-15 NOTE — Telephone Encounter (Signed)
  Follow up Call-  Call back number 05/14/2017  Post procedure Call Back phone  # [phone]- [phone]  Permission to leave phone message Yes  Some recent data might be hidden     Patient questions:  Do you have a fever, pain , or abdominal swelling? No. Pain Score  0 *  Have you tolerated food without any problems? Yes.    Have you been able to return to your normal activities? Yes.    Do you have any questions about your discharge instructions: Diet   No. Medications  No. Follow up visit  No.  Do you have questions or concerns about your Care? No.  Actions: * If pain score is 4 or above: No action needed, pain <4.

## 2017-05-15 NOTE — Telephone Encounter (Signed)
No answer, left message to call if questions or concerns. 

## 2017-05-18 ENCOUNTER — Encounter: Payer: Self-pay | Admitting: Gastroenterology

## 2017-06-08 ENCOUNTER — Ambulatory Visit (HOSPITAL_BASED_OUTPATIENT_CLINIC_OR_DEPARTMENT_OTHER): Payer: Medicare (Managed Care) | Admitting: Physical Medicine & Rehabilitation

## 2017-06-08 ENCOUNTER — Encounter: Payer: Self-pay | Admitting: Physical Medicine & Rehabilitation

## 2017-06-08 ENCOUNTER — Encounter: Payer: Medicare (Managed Care) | Attending: Physical Medicine & Rehabilitation

## 2017-06-08 VITALS — BP 132/90 | HR 53 | Resp 14

## 2017-06-08 DIAGNOSIS — G8110 Spastic hemiplegia affecting unspecified side: Secondary | ICD-10-CM

## 2017-06-08 DIAGNOSIS — I63511 Cerebral infarction due to unspecified occlusion or stenosis of right middle cerebral artery: Secondary | ICD-10-CM | POA: Diagnosis not present

## 2017-06-08 DIAGNOSIS — R414 Neurologic neglect syndrome: Secondary | ICD-10-CM | POA: Insufficient documentation

## 2017-06-08 DIAGNOSIS — G5692 Unspecified mononeuropathy of left upper limb: Secondary | ICD-10-CM

## 2017-06-08 DIAGNOSIS — I6931 Attention and concentration deficit following cerebral infarction: Secondary | ICD-10-CM | POA: Insufficient documentation

## 2017-06-08 DIAGNOSIS — M7502 Adhesive capsulitis of left shoulder: Secondary | ICD-10-CM | POA: Diagnosis not present

## 2017-06-08 DIAGNOSIS — M792 Neuralgia and neuritis, unspecified: Secondary | ICD-10-CM

## 2017-06-08 NOTE — Patient Instructions (Signed)
No need for injection , if shoulder pain increases, please call

## 2017-06-08 NOTE — Progress Notes (Signed)
Subjective:    Patient ID: Samuel Shaw, male    DOB: Nov 12, 1960, 56 y.o.   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  He has had chronic shoulder pain on the left side, but this improved after doing more regular range of motion exercises.  Some clonus LUE in am when he wakes up and stretches No significant pain Still doing shoulder exercise Patient no longer receiving Botox injections as spasticity has improved over time Pain Inventory Average Pain 5 Pain Right Now 0 My pain is aching  In the last 24 hours, has pain interfered with the following? General activity 4 Relation with others 4 Enjoyment of life 4 What TIME of day is your pain at its worst? evening Sleep (in general) Good  Pain is worse with: some activites Pain improves with: therapy/exercise and injections Relief from Meds: 7  Mobility walk with assistance use a cane how many minutes can you walk? 30 ability to climb steps?  yes do you drive?  yes Do you have any goals in this area?  yes  Function disabled: date disabled . Do you have any goals in this area?  yes  Neuro/Psych weakness trouble walking  Prior Studies Any changes since last visit?  no  Physicians involved in your care Any changes since last visit?  no   Family History  Problem Relation Age of Onset  . Diabetes Mother   . Colon cancer Father   . Esophageal cancer Neg Hx   . Rectal cancer Neg Hx   . Stomach cancer Neg Hx    Social History   Socioeconomic History  . Marital status: Married    Spouse name: [name]  . Number of children: 3  . Years of education: 2y coll  . Highest education level: None  Social Needs  . Financial resource strain: None  . Food insecurity - worry: None  . Food insecurity -  inability: None  . Transportation needs - medical: None  . Transportation needs - non-medical: None  Occupational History  . Occupation: Disabled     Comment: not working at this time  Tobacco Use  . Smoking status: Former Smoker    Packs/day: 1.00    Years: 15.00    Pack years: 15.00    Types: Cigarettes    Last attempt to quit: 06/26/2014    Years since quitting: 2.9  . Smokeless tobacco: Never Used  Substance and Sexual Activity  . Alcohol use: No    Alcohol/week: 0.0 oz  . Drug use: No  . Sexual activity: None  Other Topics Concern  . None  Social History Narrative   Caffeine.1-2 drinks a day .   History reviewed. No pertinent surgical history. Past Medical History:  Diagnosis Date  . Allergy   . Clotting disorder (HCC)    blockage in artery lt. side neck  . Hyperlipidemia   . Hypertension   . Neuromuscular disorder ([place])    stroke lt. side affected  . Stroke ([place])    BP 132/90 (BP Location: Right Arm, Patient Position: Sitting, Cuff Size: Large)   Pulse (!) 53   Resp 14   SpO2 95%   Opioid Risk Score:   Fall Risk Score:  `1  Depression screen PHQ 2/9  Depression screen [facility] 2/9 03/07/2016 03/15/2015 01/25/2015 10/05/2014  Decreased Interest 0 0 0 1  Down, Depressed, Hopeless 0 0 0 0  PHQ - 2 Score 0 0 0 1  Altered sleeping - - - 0  Tired, decreased energy - - - 0  Change in appetite - - - 0  Feeling bad or failure about yourself  - - - 0  Trouble concentrating - - - 0  Moving slowly or fidgety/restless - - - 1  Suicidal thoughts - - - 0  PHQ-9 Score - - - 2    Review of Systems  HENT: Negative.   Eyes: Negative.   Respiratory: Negative.   Cardiovascular: Negative.   Gastrointestinal: Negative.   Endocrine: Negative.   Genitourinary: Negative.   Musculoskeletal: Positive for gait problem.  Skin: Negative.   Allergic/Immunologic: Negative.   Neurological: Positive for weakness.  Hematological: Negative.   Psychiatric/Behavioral: Negative.   All  other systems reviewed and are negative.      Objective:   Physical Exam Left shoulder no pain to palpation over the subacromial area no tenderness over the upper trapezius or the upper biceps area no tenderness over the elbow wrist or hand. Mildly diminished left shoulder external rotation as well as forward flexion and abduction approximately 75% of normal range overall His gait is without evidence of toe drag or knee instability no assistive device needed Left lower extremity strength is 4+ in the hip flexor knee extensor ankle dorsiflexor. Mood and affect is flat Speech without dysarthria There is no evidence of visual spatial neglect 4-/5 at the left deltoid, bicep, tricep, grip     Assessment & Plan:  #1.  Right MCA infarct onset 2016, overall good improvements in functional status is modified independent but does have some residual left upper extremity greater than lower extremity weakness 2.  Chronic left shoulder pain overall improved after more regular range of motion exercises.  He has received suprascapular nerve blocks in the past as well as Botox to the pectoralis and biceps.  At this time he does not need any interventional procedures. If he has recurrence of pain may repeat, other potential modalities would include [employer] SPR therapeutic system

## 2017-07-16 ENCOUNTER — Other Ambulatory Visit: Payer: Self-pay | Admitting: Family Medicine

## 2017-07-16 ENCOUNTER — Ambulatory Visit
Admission: RE | Admit: 2017-07-16 | Discharge: 2017-07-16 | Disposition: A | Discharge status: Home / Self Care | Payer: No Typology Code available for payment source | Source: Ambulatory Visit | Attending: Family Medicine | Admitting: Family Medicine

## 2017-07-16 DIAGNOSIS — M5489 Other dorsalgia: Secondary | ICD-10-CM

## 2017-07-17 ENCOUNTER — Other Ambulatory Visit: Payer: Self-pay | Admitting: Family Medicine

## 2017-07-17 DIAGNOSIS — I6521 Occlusion and stenosis of right carotid artery: Secondary | ICD-10-CM

## 2017-07-24 ENCOUNTER — Ambulatory Visit
Admission: RE | Admit: 2017-07-24 | Discharge: 2017-07-24 | Disposition: A | Discharge status: Home / Self Care | Payer: Medicare (Managed Care) | Source: Ambulatory Visit | Attending: Family Medicine | Admitting: Family Medicine

## 2017-07-24 DIAGNOSIS — I6521 Occlusion and stenosis of right carotid artery: Secondary | ICD-10-CM

## 2017-09-06 ENCOUNTER — Telehealth: Payer: Self-pay | Admitting: Physical Medicine & Rehabilitation

## 2017-09-06 NOTE — Telephone Encounter (Signed)
Pt phoned stating that he would like another steroid shot. Pt's last shot was in Sept. Of 2018. Please advise.

## 2017-09-09 NOTE — Telephone Encounter (Signed)
Schedule Left suprascapular nerve block using [place]

## 2017-09-18 ENCOUNTER — Encounter: Payer: Self-pay | Admitting: Neurology

## 2017-09-18 ENCOUNTER — Encounter (INDEPENDENT_AMBULATORY_CARE_PROVIDER_SITE_OTHER): Payer: Self-pay

## 2017-09-18 ENCOUNTER — Ambulatory Visit (INDEPENDENT_AMBULATORY_CARE_PROVIDER_SITE_OTHER): Payer: Medicare (Managed Care) | Admitting: Neurology

## 2017-09-18 VITALS — BP 131/80 | HR 64 | Wt 198.2 lb

## 2017-09-18 DIAGNOSIS — E785 Hyperlipidemia, unspecified: Secondary | ICD-10-CM | POA: Diagnosis not present

## 2017-09-18 DIAGNOSIS — I69319 Unspecified symptoms and signs involving cognitive functions following cerebral infarction: Secondary | ICD-10-CM | POA: Diagnosis not present

## 2017-09-18 DIAGNOSIS — I10 Essential (primary) hypertension: Secondary | ICD-10-CM

## 2017-09-18 DIAGNOSIS — I69354 Hemiplegia and hemiparesis following cerebral infarction affecting left non-dominant side: Secondary | ICD-10-CM

## 2017-09-18 DIAGNOSIS — I63231 Cerebral infarction due to unspecified occlusion or stenosis of right carotid arteries: Secondary | ICD-10-CM

## 2017-09-18 NOTE — Patient Instructions (Signed)
-   continue ASA and lipitor for stroke prevention - check BP at home, BP goal 120-140. Avoid low BP due to right ICA occlusion - avoid dehydration and keep hydrated. - continue aricept 10mg   - follow up with PACE program - home mental exercise with crossword, puzzles, simple computer games  - Follow up with your primary care physician for stroke risk factor modification. Recommend maintain blood pressure goal 130-140, diabetes with hemoglobin A1c goal below 6.5% and lipids with LDL cholesterol goal below 70 mg/dL.  - continue home exercise for left side mild weakness. - follow up with Dr. Letta Pate - follow up in 6 months with carolyn to repeat MMSE and MOCA.

## 2017-09-18 NOTE — Progress Notes (Signed)
STROKE NEUROLOGY FOLLOW UP NOTE  NAME: Samuel Shaw DOB: 08/17/1960  REASON FOR VISIT: stroke follow up HISTORY FROM: wife and chart  Today we had the pleasure of seeing Samuel Shaw in follow-up at our Neurology Clinic. Pt was accompanied by wife.   History Summary Samuel Shaw is an 57 y.o. male PMH of 2 TIAs 2 years ago, right carotid stenosis but no intervention, and current smoker was admitted on 07/06/14 for subacute CVA in late 06/2014 in [place] with progressive left-sided weakness since the discharge. These symptoms are similar to his stroke in December, the family just feels they are getting worse. A review of records from [place] are pertinent for a head CT showing an acute right basal ganglia infarct and he is on ASA 325mg  daily. MRI showed multifocal subacute to remote infarct at right MCA territory (no new infarct) and CTA neck showed right ICA occlusion, athero vs. Dissection. He was continued on discharged to [facility] .   Follow up 10/08/14 - the patient has been doing well. Discharged from [facility] on 07/29/14 and currently outpt PT/OT, 2 times per week. He made significant progress from rehab. Currently he is walking with cane and steady. His BP is 118/85 today, he is still on ASA and lipitor. He has quit smoking and on nicotine patch now.  02/08/15 follow up - pt has been doing well. Quit smoking completely. Had CTA head and neck did not show right ICA recannulization. TCD emboli detection negative and VMR showed bilateral changes. Has finished off PT/OT and also stopped BP meds due to low BP. Today 131/98 in clinic. He is not doing much exercise at home.   08/11/15 follow up - pt has been doing the same. Has short memory loss and was put on Aricept by PCP. BP 126/85. No recurrent stroke like symptoms. Still has left sided mild weakness, doing home exercise.   01/10/16 follow up - pt had one ER visit on 12/25/15 for prolonged HA, received HA cocktail and HA resolved. As per wife, he  has HA 1-2 per year and never lasted more than a day. This time, it lasted more than two days. It normally caused by dehydration and this time he was working in the heat and again did not drink enough water. He also followed with Dr. Rexene Alberts and ruled out OSA so far. He also follows with Dr. Read Drivers for botox for muscle spasm. In terms of left sided weakness, it is stable and not change much. Pt seems not exercise as he should be as per wife.   07/12/16 follow up - pt has been doing better. No more HA, he keeps self hydrated. His aricept still at 5mg , not increase to 10mg  yet. He follows with Dr. Letta Pate for botox injection and shoulder pain too. BP ok at home as per pt and today in clinic 122/88.   Interval History During the interval time, pt has been doing the same.  On Aricept 10 mg daily.  Currently on PACE program twice a week.  Followed with PACE  program for physical and mental exercises.  However, wife still complains of patient memory issues, forgot to turn off stove, leave the doors open, cannot find objects that he has misplaced, short memory difficulties.  He had evaluation with PACE program in 01/2017 showed MMSE 29/30, MOCA 21/30.  In 10/2016, LDL 70, TSH 1.52, RPR negative, B12 1029.  Due to cognitive complaint, patient referred back for further evaluation.  Still on aspirin  Lipitor for stroke prevention.   REVIEW OF SYSTEMS: Full 14 system review of systems performed and notable only for those listed below and in HPI above, all others are negative:  Constitutional:   Cardiovascular:  Ear/Nose/Throat:   Skin:  Eyes:   Respiratory:   Gastroitestinal:   Genitourinary:  Hematology/Lymphatic:   Endocrine:  Musculoskeletal:   Allergy/Immunology:   Neurological: Memory loss Psychiatric:  Sleep:   The following represents the patient's updated allergies and side effects list: No Known Allergies  The neurologically relevant items on the patient's problem list were reviewed on today's  visit.  Neurologic Examination  A problem focused neurological exam (12 or more points of the single system neurologic examination, vital signs counts as 1 point, cranial nerves count for 8 points) was performed.  Blood pressure 131/80, pulse 64, weight 198 lb 3.2 oz (89.9 kg).  General - Well nourished, well developed, in no apparent distress.  Ophthalmologic - Sharp disc margins OU.  Cardiovascular - Regular rate and rhythm with no murmur.  Mental Status -  Level of arousal and orientation to time, place, and person were intact. Language including expression, naming, repetition, comprehension was assessed and found intact.  mini-mental status exam Orientation to time - 4/5 Orientation to place - 5/5 Registration - 3/3 Attention - 3/5 Delayed recall - 3/3 Naming - 2/2 Repetition - 1/1 Comprehension - 3/3 Reading - 1/1 Writing - 1/1 Visuospatial - 0/1 Total 26/30  MOCA visuospatial - executive 3/5 Naming - 3/3 Memory -  Attention - 2/2, 1/1, 2/3 Language - 2/2, 1/1 Abstraction - 1/2 Delayed recall - 3/5 Orientation - 5/6 Total - 23/30  Cranial Nerves II - XII - II - Visual field intact OU. III, IV, VI - Extraocular movements intact. V - Facial sensation intact bilaterally. VII - mild left facial droop. VIII - Hearing & vestibular intact bilaterally. X - Palate elevates symmetrically. XI - Chin turning & shoulder shrug intact bilaterally. XII - Tongue protrusion intact.  Motor Strength - The patient's strength was LUE 5-/5 without pronator drift, LLE 4+/5 proximal and distally.  Bulk was normal and fasciculations were absent.   Motor Tone - Muscle tone was assessed at the neck and appendages and was normal.  Reflexes - The patient's reflexes were normal in all extremities and he had no pathological reflexes.  Sensory - Light touch, temperature/pinprick were assessed and were normal.    Coordination - The patient had normal movements in the hands and feet  with no ataxia or dysmetria.  Tremor was absent.  Gait and Station - walk with cane and mild hemiparetic gait on the left.  Data reviewed: I personally reviewed the images and agree with the radiology interpretations.  Dg Chest 2 View 07/06/2014 CLINICAL DATA: Increasing left-sided weakness. Previous stroke. Smoker. EXAM: CHEST 2 VIEW COMPARISON: 10/31/2008. FINDINGS: Normal sized heart. Clear lungs. Increased density overlying the posterior aspect of the lower thoracic spine. IMPRESSION: Possible mass overlying the posterior aspect of the lower thoracic spine. A chest CT with contrast is recommended. Electronically Signed By: Enrique Sack M.D.  On: 07/06/2014 15:54   Ct Head Wo Contrast 07/06/2014 No acute finding. Findings consistent with subacute to remote right MCA territory infarct. Chronic microvascular ischemic change also noted.   Mr Brain Wo Contrast 07/06/2014 Acute multifocal infarcts within RIGHT middle cerebral artery territory (including RIGHT basal ganglia). RIGHT anterior occipital likely within RIGHT middle cerebral artery territory/watershed. Limited blood sensitive sequence without lobar hematoma. Mild white matter changes, independent of  acute areas of ischemia suggest chronic small vessel ischemic disease.   Mr Jodene Nam Head/brain Wo Cm 07/06/2014 RIGHT internal carotid artery occlusion. Bilateral posterior communicating arteries and suspected tiny anterior communicating artery present. Diminutive RIGHT middle cerebral artery flow related enhancement. Recommend CT angiogram of the neck for further evaluation.   CT angio neck 07/07/2014 Right internal carotid artery is occluded 1 cm above its origin. Etiology indeterminate. Focal plaque proximal left internal carotid artery without measurable/significant stenosis. Mild narrowing proximal right vertebral artery. Slightly ectatic ascending thoracic aorta (3.4 cm). Surrounding incidental findings.  2D echo - Left  ventricle: The cavity size was normal. Systolic function was normal. The estimated ejection fraction was in the range of 55% to 60%. Wall motion was normal; there were no regional wall motion abnormalities.  CTA head and neck 10/20/14 - Complete occlusion RIGHT internal carotid artery 1 cm above its origin. This likely relates either to dissection or focal atheromatous plaque. Reconstitution of the RIGHT anterior circulation intracranially via collateral flow No acute findings. Chronic RIGHT hemisphere MCA territory infarction.  TCD MES -  Negative for 42min  TCD VMR - decreased reserve bilaterally  Component     Latest Ref Rng 07/07/2014  Cholesterol     0 - 200 mg/dL 182  Triglycerides     <150 mg/dL 116  HDL     >39 mg/dL 24 (L)  Total CHOL/HDL Ratio      7.6  VLDL     0 - 40 mg/dL 23  LDL (calc)     0 - 99 mg/dL 135 (H)  Hemoglobin A1C     <5.7 % 5.6  Mean Plasma Glucose     <117 mg/dL 114  TSH     0.350 - 4.500 uIU/mL 0.473    Assessment: As you may recall, he is a 57 y.o. African American male with PMH of TIA, right carotid stenosis without intervention, smoker was admitted due to right MCA territory stroke. Found to have right ICA occlusion. He seems to have TIAs in 2010 and found to have right ICA stenosis at that time but no intervention done. His ICA occlusion likely due to athero, but dissection is also possibility as left ICA was smooth. Repeat CTA head and neck showed no recannulization. TCD MES negative. Has quit smoking. Stable mild left sided hemiparesis. BP stable off BP meds. Following with Dr. Letta Pate for botox injection. Sleep study did not show significant OSA. ER visit in 12/2015 for HA likely due to dehydration. After keep hydrated, his HA resolved.   Pt and wife complains of cognitive impairment with short memory loss.  Currently in PACE program.  Lab ruled out treatable dementia. 01/2017 MMSE 29/30 and MOCA 21/30. Today MMSE 26/30 and MOCA 27/30. Pt  mild cognitive impairment likely due to stroke, however stable over time so far.  Continue Aricept 10 mg daily.  Continue follow-up with PACE program.  Repeated testing in 6 months.  Plan:  - continue ASA and lipitor for stroke prevention - check BP at home, BP goal 120-140. Avoid low BP due to right ICA occlusion - avoid dehydration and keep hydrated. - continue aricept 10mg   - follow up with PACE program - home mental exercise with crossword, puzzles, simple computer games  - Follow up with your primary care physician for stroke risk factor modification. Recommend maintain blood pressure goal 130-140, diabetes with hemoglobin A1c goal below 6.5% and lipids with LDL cholesterol goal below 70 mg/dL.  - continue home exercise for  left side mild weakness. - follow up with Dr. Letta Pate - follow up in 6 months with carolyn to repeat MMSE and MOCA.    No orders of the defined types were placed in this encounter.   No orders of the defined types were placed in this encounter.   Patient Instructions  - continue ASA and lipitor for stroke prevention - check BP at home, BP goal 120-140. Avoid low BP due to right ICA occlusion - avoid dehydration and keep hydrated. - continue aricept 10mg   - follow up with PACE program - home mental exercise with crossword, puzzles, simple computer games  - Follow up with your primary care physician for stroke risk factor modification. Recommend maintain blood pressure goal 130-140, diabetes with hemoglobin A1c goal below 6.5% and lipids with LDL cholesterol goal below 70 mg/dL.  - continue home exercise for left side mild weakness. - follow up with Dr. Letta Pate - follow up in 6 months with carolyn to repeat MMSE and MOCA.     Rosalin Hawking, MD PhD [facility] [address], [place], [place] [address] [phone]

## 2017-09-27 ENCOUNTER — Ambulatory Visit (HOSPITAL_BASED_OUTPATIENT_CLINIC_OR_DEPARTMENT_OTHER): Payer: Medicare (Managed Care) | Admitting: Physical Medicine & Rehabilitation

## 2017-09-27 ENCOUNTER — Encounter: Payer: Medicare (Managed Care) | Attending: Physical Medicine & Rehabilitation

## 2017-09-27 ENCOUNTER — Encounter: Payer: Self-pay | Admitting: Physical Medicine & Rehabilitation

## 2017-09-27 VITALS — BP 141/92 | HR 53 | Resp 14 | Ht 76.0 in | Wt 200.0 lb

## 2017-09-27 DIAGNOSIS — M25512 Pain in left shoulder: Secondary | ICD-10-CM | POA: Diagnosis not present

## 2017-09-27 DIAGNOSIS — G8929 Other chronic pain: Secondary | ICD-10-CM | POA: Diagnosis not present

## 2017-09-27 DIAGNOSIS — G5692 Unspecified mononeuropathy of left upper limb: Secondary | ICD-10-CM | POA: Diagnosis not present

## 2017-09-27 DIAGNOSIS — M792 Neuralgia and neuritis, unspecified: Secondary | ICD-10-CM

## 2017-09-27 NOTE — Patient Instructions (Signed)
We can repeat injection when it wears off  Suprascapular nerve block

## 2017-09-27 NOTE — Progress Notes (Signed)
Suprascapular nerve block under ultrasound guidance Indication shoulder pain which is not relieved by analgesic medications or other conservative care. Pain is severe and interferes with functional activities. Informed consent was obtained after describing risks and benefits of the procedure with patient these include bleeding bruising and infection, the patient elected to proceed and has given written consent. Patient placed in a seated position with ipsilateral hand on opposite shoulder. Linear transducer placed along the spine of the scapula and moved superiorly into the suprascapular fossa Area marked and prepped with Betadine 25-gauge 1.5 inch needle inserted, 2 cc of .5% marcaine infiltrated 22-gauge 50 mm echo block needle inserted under direct ultrasound visualization. Suprascapular not identified. . Needle inserted into the notch. One ML Celestone 6 mg/ml and 4 ML 0.5% bupivacaine  Injected  Patient tolerated procedure well. Post procedure instructions given Return to clinic one month

## 2017-11-16 ENCOUNTER — Telehealth: Payer: Self-pay | Admitting: Physical Medicine & Rehabilitation

## 2017-11-16 NOTE — Telephone Encounter (Signed)
Pt is saying the shot he had in April has not worked & wants to know if he can come for another 1 before his sept appt?  Please call to advise...  Thanks, Vilinda Blanks.

## 2017-12-07 ENCOUNTER — Ambulatory Visit (HOSPITAL_BASED_OUTPATIENT_CLINIC_OR_DEPARTMENT_OTHER): Payer: Medicare (Managed Care) | Admitting: Physical Medicine & Rehabilitation

## 2017-12-07 ENCOUNTER — Encounter: Payer: Medicare (Managed Care) | Attending: Physical Medicine & Rehabilitation

## 2017-12-07 ENCOUNTER — Encounter: Payer: Self-pay | Admitting: Physical Medicine & Rehabilitation

## 2017-12-07 VITALS — BP 149/97 | HR 46 | Ht 74.0 in | Wt 193.0 lb

## 2017-12-07 DIAGNOSIS — G8929 Other chronic pain: Secondary | ICD-10-CM

## 2017-12-07 DIAGNOSIS — R414 Neurologic neglect syndrome: Secondary | ICD-10-CM | POA: Diagnosis not present

## 2017-12-07 DIAGNOSIS — M25512 Pain in left shoulder: Secondary | ICD-10-CM

## 2017-12-07 DIAGNOSIS — G8110 Spastic hemiplegia affecting unspecified side: Secondary | ICD-10-CM

## 2017-12-07 NOTE — Patient Instructions (Signed)
Continue Home exercises

## 2017-12-07 NOTE — Progress Notes (Signed)
Subjective:    Patient ID: Samuel Shaw, male    DOB: 19-May-1961, 57 y.o.   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  He has had chronic shoulder pain on the left side, but this improved after doing more regular range of motion exercises.  Some clonus LUE in am when he wakes up and stretches HPI Left shoulder not keeping him up at night Last suprascapular nerve bolock 09/27/17 did not work as well as previous injection No falls Left AFO wears it M-F  Dressing and bathing No housework  [facility] and Friday Sees PCP there - Dr Jimmye Norman Pain Inventory Average Pain 3 Pain Right Now 3 My pain is aching  In the last 24 hours, has pain interfered with the following? General activity 0 Relation with others 0 Enjoyment of life 0 What TIME of day is your pain at its worst? Night time when sleeping on Left shoulder Sleep (in general) good  Pain is worse with: sleeping on left Pain improves with: relieving pressure , rolling to RIght side Relief from Meds: None needed  Mobility ability to climb steps?  yes do you drive?  yes  Function not employed: date last employed 2015  Neuro/Psych tremor  Prior Studies   Physicians involved in your care none   Family History  Problem Relation Age of Onset  . Diabetes Mother   . Colon cancer Father   . Esophageal cancer Neg Hx   . Rectal cancer Neg Hx   . Stomach cancer Neg Hx    Social History   Socioeconomic History  . Marital status: Married    Spouse name: [name]  . Number of children: 3  . Years of education: 2y coll  . Highest education level: Not on file  Occupational History  . Occupation: Disabled     Comment: not working at this time  Social Needs  . Financial resource strain:  Not on file  . Food insecurity:    Worry: Not on file    Inability: Not on file  . Transportation needs:    Medical: Not on file    Non-medical: Not on file  Tobacco Use  . Smoking status: Former Smoker    Packs/day: 1.00    Years: 15.00    Pack years: 15.00    Types: Cigarettes    Last attempt to quit: 06/26/2014    Years since quitting: 3.4  . Smokeless tobacco: Never Used  Substance and Sexual Activity  . Alcohol use: No    Alcohol/week: 0.0 oz  . Drug use: No    Frequency: 3.0 times per week  . Sexual activity: Not on file  Lifestyle  . Physical activity:    Days per week: Not on file    Minutes per session: Not on file  . Stress: Not on file  Relationships  . Social connections:    Talks on phone: Not on file    Gets together: Not on file    Attends religious service: Not on file    Active member of club or organization: Not on file    Attends meetings of clubs or organizations: Not on file    Relationship status: Not on file  Other Topics Concern  . Not on file  Social History Narrative   Caffeine.1-2 drinks  a day .   No past surgical history on file. Past Medical History:  Diagnosis Date  . Allergy   . Clotting disorder (HCC)    blockage in artery lt. side neck  . Hyperlipidemia   . Hypertension   . Neuromuscular disorder ([place])    stroke lt. side affected  . Stroke ([place])    BP (!) 149/97   Pulse (!) 46   Ht 6\' 2"  (1.88 m)   Wt 193 lb (87.5 kg)   SpO2 95%   BMI 24.78 kg/m   Opioid Risk Score:   Fall Risk Score:  `1  Depression screen PHQ 2/9  Depression screen [facility] 2/9 12/07/2017 03/07/2016 03/15/2015 01/25/2015 10/05/2014  Decreased Interest 0 0 0 0 1  Down, Depressed, Hopeless 0 0 0 0 0  PHQ - 2 Score 0 0 0 0 1  Altered sleeping - - - - 0  Tired, decreased energy - - - - 0  Change in appetite - - - - 0  Feeling bad or failure about yourself  - - - - 0  Trouble concentrating - - - - 0  Moving slowly or fidgety/restless - - - - 1  Suicidal  thoughts - - - - 0  PHQ-9 Score - - - - 2    Review of Systems     Objective:   Physical Exam  Constitutional: He is oriented to person, place, and time. He appears well-developed and well-nourished. No distress.  HENT:  Head: Normocephalic and atraumatic.  Eyes: Pupils are equal, round, and reactive to light. EOM are normal.  Neurological: He is alert and oriented to person, place, and time. He displays atrophy. Coordination and gait abnormal.  Reflex Scores:      Patellar reflexes are 3+ on the left side.      Achilles reflexes are 3+ on the left side. Motor 4- Left delt, 4/5 Left Bi, tri, grip, HF, KE, 3- Left ankle DF Pt states LT sensation equal in bilateral upper and lower limbs Mild atrophy left quad vs Right side  Uses AFO and straight cane for ambulation  Reduced fine motor coordination Left hand  Skin: He is not diaphoretic.  Nursing note and vitals reviewed.  Stiff legged gait on left side Mood and affect are flat       Assessment & Plan:  1.  History of right MCA distribution infarct in 2016.  Has residual left upper and lower limb weakness which is relatively mild.  No significant weakness is ankle dorsiflexion and requires AFO for ambulation. He has plateaued in his function.  He does need to continue exercises to prevent loss of strength and mobility.  2.  Chronic left shoulder pain overall mild at this point.  Has had some relief with suprascapular nerve blocks in the past.  His pain is currently only 3 out of 10 and does not interfere with activities.  Will have patient call to repeat if needed.  Physical medicine rehab follow-up in 6 months

## 2017-12-17 ENCOUNTER — Encounter: Payer: Medicare (Managed Care) | Attending: Family Medicine

## 2018-03-08 ENCOUNTER — Encounter: Payer: Medicare (Managed Care) | Attending: Physical Medicine & Rehabilitation

## 2018-03-08 ENCOUNTER — Ambulatory Visit (HOSPITAL_BASED_OUTPATIENT_CLINIC_OR_DEPARTMENT_OTHER): Payer: Medicare (Managed Care) | Admitting: Physical Medicine & Rehabilitation

## 2018-03-08 ENCOUNTER — Encounter: Payer: Self-pay | Admitting: Physical Medicine & Rehabilitation

## 2018-03-08 VITALS — BP 139/93 | HR 53 | Ht 74.0 in | Wt 194.6 lb

## 2018-03-08 DIAGNOSIS — M25512 Pain in left shoulder: Secondary | ICD-10-CM | POA: Insufficient documentation

## 2018-03-08 DIAGNOSIS — G8110 Spastic hemiplegia affecting unspecified side: Secondary | ICD-10-CM | POA: Diagnosis not present

## 2018-03-08 DIAGNOSIS — G8929 Other chronic pain: Secondary | ICD-10-CM

## 2018-03-08 NOTE — Patient Instructions (Signed)
Nerve block left suprascapular today Discussed peripheral nerve stimulation

## 2018-03-08 NOTE — Progress Notes (Signed)
Suprascapular nerve block under ultrasound guidance Indication shoulder pain which is not relieved by analgesic medications or other conservative care. Pain is severe and interferes with functional activities. Informed consent was obtained after describing risks and benefits of the procedure with patient these include bleeding bruising and infection, the patient elected to proceed and has given written consent. Patient placed in a seated position with ipsilateral hand on opposite shoulder. Linear transducer placed along the spine of the scapula and moved superiorly into the suprascapular fossa Area marked and prepped with Betadine 25-gauge 1.5 inch needle inserted, 2 cc of 1% lidocaine infiltrated 22-gauge 80 mm echo block needle inserted under direct ultrasound visualization. Suprascapular not identified. Doppler used to identify blood vessels. Needle inserted into the notch. One ML celestone 6 mg and 4 ML 0.25% bupivacaine  Injected  Patient tolerated procedure well. Post procedure instructions given Return to clinic two month

## 2018-03-20 NOTE — Progress Notes (Signed)
[facility]  PATIENT: Samuel Shaw DOB: 1961-03-08   REASON FOR VISIT: Follow-up for history of CVA mild cognitive impairment HISTORY FROM: Patient and wife    HISTORY OF PRESENT ILLNESS:Samuel Shaw is an 57 y.o. male PMH of 2 TIAs 2 years ago, right carotid stenosis but no intervention, and current smoker was admitted on 07/06/14 for subacute CVA in late 06/2014 in [place] with progressive left-sided weakness since the discharge. These symptoms are similar to his stroke in December, the family just feels they are getting worse. A review of records from [place] are pertinent for a head CT showing an acute right basal ganglia infarct and he is on ASA 325mg  daily. MRI showed multifocal subacute to remote infarct at right MCA territory (no new infarct) and CTA neck showed right ICA occlusion, athero vs. Dissection. He was continued on discharged to [facility] .   Follow up 10/08/14 - the patient has been doing well. Discharged from [facility] on 07/29/14 and currently outpt PT/OT, 2 times per week. He made significant progress from rehab. Currently he is walking with cane and steady. His BP is 118/85 today, he is still on ASA and lipitor. He has quit smoking and on nicotine patch now.  02/08/15 follow up - pt has been doing well. Quit smoking completely. Had CTA head and neck did not show right ICA recannulization. TCD emboli detection negative and VMR showed bilateral changes. Has finished off PT/OT and also stopped BP meds due to low BP. Today 131/98 in clinic. He is not doing much exercise at home.   08/11/15 follow up - pt has been doing the same. Has short memory loss and was put on Aricept by PCP. BP 126/85. No recurrent stroke like symptoms. Still has left sided mild weakness, doing home exercise.   01/10/16 follow up - pt had one ER visit on 12/25/15 for prolonged HA, received HA cocktail and HA resolved. As per wife, he has HA 1-2 per year and never lasted more than a day. This  time, it lasted more than two days. It normally caused by dehydration and this time he was working in the heat and again did not drink enough water. He also followed with Dr. Rexene Alberts and ruled out OSA so far. He also follows with Dr. Read Drivers for botox for muscle spasm. In terms of left sided weakness, it is stable and not change much. Pt seems not exercise as he should be as per wife.   07/12/16 follow up - pt has been doing better. No more HA, he keeps self hydrated. His aricept still at 5mg , not increase to 10mg  yet. He follows with Dr. Letta Pate for botox injection and shoulder pain too. BP ok at home as per pt and today in clinic 122/88.   Interval History3/19/19 Dr. Erlinda Hong During the interval time, pt has been doing the same.  On Aricept 10 mg daily.  Currently on [facility] twice a week.  Followed with [facility]  program for physical and mental exercises.  However, wife still complains of patient memory issues, forgot to turn off stove, leave the doors open, cannot find objects that he has misplaced, short memory difficulties.  He had evaluation with [facility] program in 01/2017 showed MMSE 29/30, MOCA 21/30.  In 10/2016, LDL 70, TSH 1.52, RPR negative, B12 1029.  Due to cognitive complaint, patient referred back for further evaluation.  Still on aspirin Lipitor for stroke prevention.  UPDATE 9/19/2019CM [name], 57 year old male returns for follow-up  with his wife with history of CVA mild cognitive impairment.  He had sleep study in the past which ruled out obstructive sleep apnea.  He continues to go to [facility] 2 times a week.  He continues to have weakness and uses a cane to ambulate.  He denies any falls.  He no longer gets any physical therapy.  He is able to perform his activities of daily living except dressing.  He is unable to prepare meals do any housekeeping longer, he does not drive he is not responsible for his medications and he is not able to handle money.  Wife reports that his short-term memory is  still not good however on MMSE he does not miss any recall questions.  He remains on aspirin and Lipitor for secondary stroke prevention.  He has minimal bruising and no bleeding.  Appetite is reportedly good.  He denies any trouble sleeping although he does snore.  He returns for reevaluation    REVIEW OF SYSTEMS: Full 14 system review of systems performed and notable only for those listed, all others are neg:  Constitutional: neg  Cardiovascular: neg Ear/Nose/Throat: neg  Skin: neg Eyes: neg Respiratory: neg Gastroitestinal: neg  Hematology/Lymphatic: neg  Endocrine: neg Musculoskeletal:neg Allergy/Immunology: neg Neurological: Memory loss Psychiatric: neg Sleep : Snoring   ALLERGIES: No Known Allergies  HOME MEDICATIONS: Outpatient Medications Prior to Visit  Medication Sig Dispense Refill  . acetaminophen (TYLENOL) 325 MG tablet Take 2 tablets (650 mg total) by mouth every 6 (six) hours as needed for mild pain or headache.    Marland Kitchen aspirin EC 81 MG tablet Take 81 mg by mouth daily.    Marland Kitchen atorvastatin (LIPITOR) 20 MG tablet Take 1 tablet (20 mg total) by mouth daily at 6 PM. 30 tablet 1  . donepezil (ARICEPT) 5 MG tablet Take 10 mg by mouth at bedtime.   2  . fexofenadine (ALLEGRA) 30 MG tablet Take 60 mg by mouth every morning.     . gabapentin (NEURONTIN) 300 MG capsule take 1 capsule BY MOUTH THREE TIMES DAILY 90 capsule 0  . traZODone (DESYREL) 50 MG tablet Take 50 mg by mouth at bedtime.  1   No facility-administered medications prior to visit.     PAST MEDICAL HISTORY: Past Medical History:  Diagnosis Date  . Allergy   . Clotting disorder (HCC)    blockage in artery lt. side neck  . Hyperlipidemia   . Hypertension   . Neuromuscular disorder ([place])    stroke lt. side affected  . Stroke [facility])     PAST SURGICAL HISTORY: No past surgical history on file.  FAMILY HISTORY: Family History  Problem Relation Age of Onset  . Diabetes Mother   . Colon cancer Father     . Esophageal cancer Neg Hx   . Rectal cancer Neg Hx   . Stomach cancer Neg Hx     SOCIAL HISTORY: Social History   Socioeconomic History  . Marital status: Married    Spouse name: Samuel Shaw  . Number of children: 3  . Years of education: 2y coll  . Highest education level: Not on file  Occupational History  . Occupation: Disabled     Comment: not working at this time  Social Needs  . Financial resource strain: Not on file  . Food insecurity:    Worry: Not on file    Inability: Not on file  . Transportation needs:    Medical: Not on file    Non-medical: Not on  file  Tobacco Use  . Smoking status: Former Smoker    Packs/day: 1.00    Years: 15.00    Pack years: 15.00    Types: Cigarettes    Last attempt to quit: 06/26/2014    Years since quitting: 3.7  . Smokeless tobacco: Never Used  Substance and Sexual Activity  . Alcohol use: No    Alcohol/week: 0.0 standard drinks  . Drug use: No    Frequency: 3.0 times per week  . Sexual activity: Not on file  Lifestyle  . Physical activity:    Days per week: Not on file    Minutes per session: Not on file  . Stress: Not on file  Relationships  . Social connections:    Talks on phone: Not on file    Gets together: Not on file    Attends religious service: Not on file    Active member of club or organization: Not on file    Attends meetings of clubs or organizations: Not on file    Relationship status: Not on file  . Intimate partner violence:    Fear of current or ex partner: Not on file    Emotionally abused: Not on file    Physically abused: Not on file    Forced sexual activity: Not on file  Other Topics Concern  . Not on file  Social History Narrative   Caffeine.1-2 drinks a day .     PHYSICAL EXAM  Vitals:   03/21/18 0922  BP: 116/81  Pulse: (!) 51  Weight: 197 lb 6.4 oz (89.5 kg)  Height: 6\' 2"  (1.88 m)   Body mass index is 25.34 kg/m.  Generalized: Well developed, in no acute distress  Head:  normocephalic and atraumatic,. Oropharynx benign  Neck: Supple, no carotid bruits  Cardiac: Regular rate rhythm, no murmur  Musculoskeletal: No deformity   Neurological examination   Mentation: Alert.AFT 10. Clock drawing 4/4 MMSE - Mini Mental State Exam 03/21/2018 09/18/2017  Orientation to time 4 4  Orientation to Place 5 5  Registration 3 3  Attention/ Calculation 3 3  Recall 3 3  Language- name 2 objects 2 2  Language- repeat 0 1  Language- follow 3 step command 3 3  Language- read & follow direction 1 1  Write a sentence 1 1  Copy design 0 0  Total score 25 26    Attention span and concentration appropriate. Recent and remote memory intact.  Follows all commands speech and language fluent.   Cranial nerve II-XII: Pupils were equal round reactive to light extraocular movements were full, visual field were full on confrontational test.  Mild left facial droop  hearing was intact to finger rubbing bilaterally. Uvula tongue midline. head turning and shoulder shrug were normal and symmetric.Tongue protrusion into cheek strength was normal. Motor: normal bulk and tone, full strength in the BUE, BLE, except left lower extremity 4/5.  Sensory: normal and symmetric to light touch, pinprick, and  Vibration, in the upper and lower extremities Coordination: finger to nose finger  no dysmetria Reflexes: 1+ upper lower and symmetric, plantar responses were flexor bilaterally. Gait and Station: Rising up from seated position with push off, mild hemiparetic gait on the left ambulates with single-point cane   DIAGNOSTIC DATA (LABS, IMAGING, TESTING) - I reviewed patient records, labs, notes, testing and imaging myself where available.  Lab Results  Component Value Date   WBC 5.7 07/25/2016   HGB 16.7 07/25/2016   HCT 45.7 07/25/2016  MCV 88.2 07/25/2016   PLT 198 07/25/2016      Component Value Date/Time   NA 137 07/25/2016 2257   K 4.6 07/25/2016 2257   CL 105 07/25/2016 2257   CO2  21 (L) 07/25/2016 2257   GLUCOSE 243 (H) 07/25/2016 2257   BUN 11 07/25/2016 2257   CREATININE 1.01 07/25/2016 2257   CREATININE 1.05 10/14/2014 1738   CALCIUM 10.0 07/25/2016 2257   PROT 7.2 07/25/2016 2257   ALBUMIN 4.7 07/25/2016 2257   AST 35 07/25/2016 2257   ALT 26 07/25/2016 2257   ALKPHOS 55 07/25/2016 2257   BILITOT 1.9 (H) 07/25/2016 2257   GFRNONAA >60 07/25/2016 2257   GFRAA >60 07/25/2016 2257   Lab Results  Component Value Date   CHOL 182 07/07/2014   HDL 24 (L) 07/07/2014   LDLCALC 135 (H) 07/07/2014   TRIG 116 07/07/2014   CHOLHDL 7.6 07/07/2014   Lab Results  Component Value Date   HGBA1C 5.6 07/07/2014   No results found for: [id number] Lab Results  Component Value Date   TSH 0.473 07/07/2014      ASSESSMENT AND PLAN 57 y.o. African American male with PMH of TIA, right carotid stenosis without intervention, smoker was admitted due to right MCA territory stroke. Found to have right ICA occlusion. He seems to have TIAs in 2010 and found to have right ICA stenosis at that time but no intervention done. His ICA occlusion likely due to athero, but dissection is also possibility as left ICA was smooth. Repeat CTA head and neck showed no recannulization. TCD MES negative. Has quit smoking. Stable mild left sided hemiparesis. BP stable off BP meds. Following with Dr. Letta Pate for suprascapular nerve block. Sleep study did not show significant OSA. Pt and wife complains of cognitive impairment with short memory loss.  Currently in [facility] program.  Lab ruled out treatable dementia. 01/2017 MMSE 29/30 09/18/17  MMSE 26/30 and today 25/30.  Pt mild cognitive impairment likely due to stroke, however stable over time so far.  Continue Aricept 10 mg daily.  Continue follow-up with [facility] program.  The patient is a current patient of Dr. Erlinda Hong   who no longer works at our office.  This note is sent to the work in doctor.     Plan:   Continue ASA and lipitor for stroke  prevention BP goal 120-140. Avoid low BP due to right ICA occlusion  Stay well hydrated and avoid dehydration  Continue to use a cane for safe ambulation Continue aricept 10mg  memory score is stable Continue  [facility] program Home mental exercise with crossword, puzzles, simple computer games  Follow up with your primary care physician for stroke risk factor modification. Recommend maintain blood pressure goal 130-140, diabetes with hemoglobin A1c goal below 6.5% and lipids with LDL cholesterol goal below 70 mg/dL.  Continue home exercise for left side mild weakness. Continue to follow up with Dr. Letta Pate F/U in 8 months  Dennie Bible, [facility], [facility], [facility] [address], [place], [place] [address] [phone]

## 2018-03-21 ENCOUNTER — Ambulatory Visit (INDEPENDENT_AMBULATORY_CARE_PROVIDER_SITE_OTHER): Payer: No Typology Code available for payment source | Admitting: Nurse Practitioner

## 2018-03-21 ENCOUNTER — Encounter: Payer: Self-pay | Admitting: Nurse Practitioner

## 2018-03-21 VITALS — BP 116/81 | HR 51 | Ht 74.0 in | Wt 197.4 lb

## 2018-03-21 DIAGNOSIS — E785 Hyperlipidemia, unspecified: Secondary | ICD-10-CM

## 2018-03-21 DIAGNOSIS — I63231 Cerebral infarction due to unspecified occlusion or stenosis of right carotid arteries: Secondary | ICD-10-CM

## 2018-03-21 DIAGNOSIS — G8110 Spastic hemiplegia affecting unspecified side: Secondary | ICD-10-CM | POA: Diagnosis not present

## 2018-03-21 DIAGNOSIS — I69319 Unspecified symptoms and signs involving cognitive functions following cerebral infarction: Secondary | ICD-10-CM

## 2018-03-21 NOTE — Patient Instructions (Signed)
Continue ASA and lipitor for stroke prevention BP goal 120-140. Avoid low BP due to right ICA occlusion  Stay well hydrated and avoid dehydration  Continue to use a cane for safe ambulation Continue aricept 10mg  memory score is stable Continue  [facility] program Home mental exercise with crossword, puzzles, simple computer games  Follow up with your primary care physician for stroke risk factor modification. Recommend maintain blood pressure goal 130-140, diabetes with hemoglobin A1c goal below 6.5% and lipids with LDL cholesterol goal below 70 mg/dL.  Continue home exercise for left side mild weakness. Continue to follow up with Dr. Letta Pate F/U in 8 months

## 2018-03-22 NOTE — Progress Notes (Signed)
I have reviewed and agreed above plan. 

## 2018-03-28 ENCOUNTER — Ambulatory Visit: Payer: Medicare (Managed Care) | Admitting: Physical Medicine & Rehabilitation

## 2018-05-09 ENCOUNTER — Encounter: Payer: Self-pay | Admitting: Physical Medicine & Rehabilitation

## 2018-05-09 ENCOUNTER — Encounter: Payer: Medicare (Managed Care) | Attending: Physical Medicine & Rehabilitation

## 2018-05-09 ENCOUNTER — Ambulatory Visit (HOSPITAL_BASED_OUTPATIENT_CLINIC_OR_DEPARTMENT_OTHER): Payer: Medicare (Managed Care) | Admitting: Physical Medicine & Rehabilitation

## 2018-05-09 VITALS — BP 122/83 | HR 54 | Ht 74.0 in | Wt 194.0 lb

## 2018-05-09 DIAGNOSIS — G8929 Other chronic pain: Secondary | ICD-10-CM | POA: Diagnosis not present

## 2018-05-09 DIAGNOSIS — M25512 Pain in left shoulder: Secondary | ICD-10-CM | POA: Insufficient documentation

## 2018-05-09 NOTE — Patient Instructions (Signed)

## 2018-05-09 NOTE — Progress Notes (Signed)
Subjective:    Patient ID: Samuel Shaw, male    DOB: 1961/04/11, 57 y.o.   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  He has had chronic shoulder pain on the left side,  Right suprascapular nerve block is still working  Using tylenol ~1 x per month [facility] 2[facility] per [facility], rides exercise bike 52min Does HEP  Pain Inventory Average Pain 0 Pain Right Now 0 My pain is no pain  In the last 24 hours, has pain interfered with the following? General activity 0 Relation with others 0 Enjoyment of life 0 What TIME of day is your pain at its worst? no pain Sleep (in general) Good  Pain is worse with: no pain Pain improves with: no pain Relief from Meds: no pain  Mobility walk without assistance use a cane use a walker how many minutes can you walk? 30 ability to climb steps?  yes do you drive?  yes Do you have any goals in this area?  yes  Function I need assistance with the following:  dressing, bathing, meal prep and shopping Do you have any goals in this area?  no  Neuro/Psych trouble walking  Prior Studies Any changes since last visit?  no  Physicians involved in your care Any changes since last visit?  no   Family History  Problem Relation Age of Onset  . Diabetes Mother   . Colon cancer Father   . Esophageal cancer Neg Hx   . Rectal cancer Neg Hx   . Stomach cancer Neg Hx    Social History   Socioeconomic History  . Marital status: Married    Spouse name: [name]  . Number of children: 3  . Years of education: 2y coll  . Highest education level: Not on file  Occupational History  . Occupation: Disabled     Comment: not working at this time  Social Needs  . Financial resource strain: Not on file  . Food insecurity:   Worry: Not on file    Inability: Not on file  . Transportation needs:    Medical: Not on file    Non-medical: Not on file  Tobacco Use  . Smoking status: Former Smoker    Packs/day: 1.00    Years: 15.00    Pack years: 15.00    Types: Cigarettes    Last attempt to quit: 06/26/2014    Years since quitting: 3.8  . Smokeless tobacco: Never Used  Substance and Sexual Activity  . Alcohol use: No    Alcohol/week: 0.0 standard drinks  . Drug use: No    Frequency: 3.0 times per week  . Sexual activity: Not on file  Lifestyle  . Physical activity:    Days per week: Not on file    Minutes per session: Not on file  . Stress: Not on file  Relationships  . Social connections:    Talks on phone: Not on file    Gets together: Not on file    Attends religious service: Not on file    Active member of club or organization: Not on file    Attends meetings of clubs or organizations: Not on file    Relationship status: Not on file  Other Topics Concern  . Not on file  Social History Narrative  Caffeine.1-2 drinks a day .   History reviewed. No pertinent surgical history. Past Medical History:  Diagnosis Date  . Allergy   . Clotting disorder (HCC)    blockage in artery lt. side neck  . Hyperlipidemia   . Hypertension   . Neuromuscular disorder ([place])    stroke lt. side affected  . Stroke ([place])    Ht 6\' 2"  (1.88 m)   Wt 194 lb (88 kg)   BMI 24.91 kg/m   Opioid Risk Score:   Fall Risk Score:  `1  Depression screen PHQ 2/9  Depression screen [facility] 2/9 12/07/2017 03/07/2016 03/15/2015 01/25/2015 10/05/2014  Decreased Interest 0 0 0 0 1  Down, Depressed, Hopeless 0 0 0 0 0  PHQ - 2 Score 0 0 0 0 1  Altered sleeping - - - - 0  Tired, decreased energy - - - - 0  Change in appetite - - - - 0  Feeling bad or failure about yourself  - - - - 0  Trouble concentrating - - - - 0  Moving slowly or fidgety/restless - - - - 1  Suicidal thoughts - - - - 0  PHQ-9 Score - - - - 2    Review of  Systems  Constitutional: Negative.   HENT: Negative.   Eyes: Negative.   Gastrointestinal: Negative.   Endocrine: Negative.   Genitourinary: Negative.   Musculoskeletal: Positive for back pain and gait problem.  Skin: Negative.   Allergic/Immunologic: Negative.   Hematological: Negative.   Psychiatric/Behavioral: Negative.   All other systems reviewed and are negative.      Objective:   Physical Exam  Constitutional: He is oriented to person, place, and time. He appears well-developed and well-nourished.  HENT:  Head: Normocephalic and atraumatic.  Eyes: Pupils are equal, round, and reactive to light.  Neck: Normal range of motion.  Musculoskeletal:  Range of motion mildly reduced with left shoulder abduction as well as forward flexion.  No pain with range of motion.  Neurological: He is alert and oriented to person, place, and time.  Motor strength is 4/5 in the left deltoid, bicep, tricep, grip 5/5 in the right deltoid bicep tricep grip   Skin: Skin is warm and dry.  Nursing note and vitals reviewed.         Assessment & Plan:  #1.  Chronic hemiplegic shoulder pain left side he has excellent relief with suprascapular nerve blocks under ultrasound guidance performed on an average of every 6 months.  His last injection was in September 2019.  Will return to clinic in approximately 4 months for possible reinjection.  This can be repeated as early as December if needed. Discussed with patient agrees with plan

## 2018-06-07 ENCOUNTER — Ambulatory Visit: Payer: Medicare (Managed Care) | Admitting: Physical Medicine & Rehabilitation

## 2018-09-03 ENCOUNTER — Encounter: Payer: Medicare (Managed Care) | Attending: Physical Medicine & Rehabilitation

## 2018-09-03 ENCOUNTER — Ambulatory Visit: Payer: Medicare (Managed Care) | Admitting: Physical Medicine & Rehabilitation

## 2018-11-14 ENCOUNTER — Telehealth: Payer: Self-pay

## 2018-11-14 NOTE — Telephone Encounter (Signed)
I called pt that his visit will be change to video due to COVID 19 pandemic.I stated it will not be a in office visit. He gave verbal consent to do video visit. He confirmed email listed in computer. Pt goes to [facility] for his PCP and medications. He has a cell phone with a camera. He knows how to access email from his cell phone. Pt has [employer] mobile as his carrier. Rn will be unable to text link because its not sprint, verizon, at&t,or virgin mobile or tmobile. I stated email will be sent day of visit. Pt verbalized understanding.

## 2018-11-19 ENCOUNTER — Other Ambulatory Visit: Payer: Self-pay

## 2018-11-19 ENCOUNTER — Ambulatory Visit: Payer: Medicaid Other | Admitting: Adult Health

## 2018-11-19 NOTE — Telephone Encounter (Signed)
I call pt that our system is down. PT r/s for June video visit.

## 2018-11-19 NOTE — Telephone Encounter (Signed)
Rn email link to patient today at 1041am for video visit.

## 2018-12-26 NOTE — Telephone Encounter (Signed)
Pt email doxy video link for visit on Monday.

## 2018-12-26 NOTE — Telephone Encounter (Signed)
I called pt and updated meds, allergies and pcp with pt. Pt is currently at [facility] but they are still closed due to COVID 19. His medications are also managed by the medical providers at [facility]. I stated to pt the doxy link and appt reminder was email today and to click link 10 minutes prior to visit. Pt verbalized understanding.

## 2018-12-30 ENCOUNTER — Other Ambulatory Visit: Payer: Self-pay

## 2018-12-30 ENCOUNTER — Ambulatory Visit (INDEPENDENT_AMBULATORY_CARE_PROVIDER_SITE_OTHER): Payer: Medicare (Managed Care) | Admitting: Adult Health

## 2018-12-30 ENCOUNTER — Telehealth: Payer: Self-pay | Admitting: Adult Health

## 2018-12-30 ENCOUNTER — Encounter: Payer: Self-pay | Admitting: Adult Health

## 2018-12-30 DIAGNOSIS — I69319 Unspecified symptoms and signs involving cognitive functions following cerebral infarction: Secondary | ICD-10-CM

## 2018-12-30 DIAGNOSIS — I69354 Hemiplegia and hemiparesis following cerebral infarction affecting left non-dominant side: Secondary | ICD-10-CM | POA: Diagnosis not present

## 2018-12-30 DIAGNOSIS — I63231 Cerebral infarction due to unspecified occlusion or stenosis of right carotid arteries: Secondary | ICD-10-CM

## 2018-12-30 DIAGNOSIS — I10 Essential (primary) hypertension: Secondary | ICD-10-CM | POA: Diagnosis not present

## 2018-12-30 DIAGNOSIS — E785 Hyperlipidemia, unspecified: Secondary | ICD-10-CM | POA: Diagnosis not present

## 2018-12-30 MED ORDER — DONEPEZIL HCL 10 MG PO TABS: 10.0000 mg | ORAL_TABLET | Freq: Every day | ORAL | 3 refills | Status: DC

## 2018-12-30 NOTE — Telephone Encounter (Signed)
[facility]/medicaid order sent to [facility]. They will obtain the auth for medicaid and reach out to the patient to schedule.

## 2018-12-30 NOTE — Progress Notes (Signed)
[facility]  PATIENT: Samuel Shaw DOB: [birth year]   REASON FOR VISIT: Follow-up for history of CVA mild cognitive impairment HISTORY FROM: Patient and wife  Virtual Visit via Video Note  I connected with Samuel Shaw on 12/30/18 at  2:45 PM EDT by a video enabled telemedicine application located at [facility] and verified that I am speaking with the correct person using two identifiers who was located at their own home accompanied by his wife.  He was initially scheduled today for face-to-face office follow-up visit but due to COVID-19 safety precautions, visit transition to telemedicine via [email or web address] with patient's consent.   I discussed the limitations of evaluation and management by telemedicine and the availability of in person appointments. The patient expressed understanding and agreed to proceed.    HISTORY OF PRESENT ILLNESS:Samuel Shaw is an 58 y.o. male PMH of 2 TIAs 2 years ago, right carotid stenosis but no intervention, and current smoker was admitted on 07/06/14 for subacute CVA in late 06/2014 in [place] with progressive left-sided weakness since the discharge. These symptoms are similar to his stroke in December, the family just feels they are getting worse. A review of records from [place] are pertinent for a head CT showing an acute right basal ganglia infarct and he is on ASA 325mg  daily. MRI showed multifocal subacute to remote infarct at right MCA territory (no new infarct) and CTA neck showed right ICA occlusion, athero vs. Dissection. He was continued on discharged to [facility] .  Repeat CTA stable.     Interval History3/19/19 Dr. Erlinda Hong During the interval time, pt has been doing the same.  On Aricept 10 mg daily.  Currently on [facility] twice a [facility].  Followed with [facility]  program for physical and mental exercises.  However, wife still complains of patient memory issues, forgot to turn off stove, leave the doors open, cannot  find objects that he has misplaced, short memory difficulties.  He had evaluation with [facility] in 01/2017 showed MMSE 29/30, MOCA 21/30.  In 10/2016, LDL 70, TSH 1.52, RPR negative, B12 1029.  Due to cognitive complaint, patient referred back for further evaluation.  Still on aspirin Lipitor for stroke prevention.   UPDATE 9/19/2019CM Samuel Shaw, 58 year old male returns for follow-up with his wife with history of CVA mild cognitive impairment.  He had sleep study in the past which ruled out obstructive sleep apnea.  He continues to go to [facility] times a [facility].  He continues to have weakness and uses a cane to ambulate.  He denies any falls.  He no longer gets any physical therapy.  He is able to perform his activities of daily living except dressing.  He is unable to prepare meals do any housekeeping longer, he does not drive he is not responsible for his medications and he is not able to handle money.  Wife reports that his short-term memory is still not good however on MMSE he does not miss any recall questions.  He remains on aspirin and Lipitor for secondary stroke prevention.  He has minimal bruising and no bleeding.  Appetite is reportedly good.  He denies any trouble sleeping although he does snore.  He returns for reevaluation  12/30/2018 VIRTUAL VISIT: Samuel Shaw is a 58 year old male who is being seen today for follow-up regarding history of stroke with mild cognitive impairment and residual left hemiparesis. Memory has been stable with ongoing use of Aricept 10mg  daily without side effects. He does  need assistance with bathing and dressing due to left hand weakness and safety precautions.  Wife and patient are questioning possible worsening of left hand weakness with subjective LUE "shaking" symptoms which is worsened with activity.  He does need slightly more assistance with fine motor control tasks such as buttoning shirts or tying shoes.  Continues on aspirin and atorvastatin for secondary stroke  prevention without reports of side effects. Blood pressure not recently monitored as previously stable.  No further concerns at this time.    REVIEW OF SYSTEMS: Full 14 system review of systems performed and notable only for those listed, all others are neg:  Weakness, memory loss, confusion  ALLERGIES: No Known Allergies  HOME MEDICATIONS: Outpatient Medications Prior to Visit  Medication Sig Dispense Refill  . acetaminophen (TYLENOL) 325 MG tablet Take 2 tablets (650 mg total) by mouth every 6 (six) hours as needed for mild pain or headache.    Marland Kitchen aspirin EC 81 MG tablet Take 81 mg by mouth daily.    Marland Kitchen atorvastatin (LIPITOR) 20 MG tablet Take 1 tablet (20 mg total) by mouth daily at 6 PM. 30 tablet 1  . donepezil (ARICEPT) 5 MG tablet Take 10 mg by mouth at bedtime.   2  . fexofenadine (ALLEGRA) 30 MG tablet Take 60 mg by mouth every morning.     . gabapentin (NEURONTIN) 300 MG capsule take 1 capsule BY MOUTH THREE TIMES DAILY 90 capsule 0  . traZODone (DESYREL) 50 MG tablet Take 50 mg by mouth at bedtime.  1   No facility-administered medications prior to visit.     PAST MEDICAL HISTORY: Past Medical History:  Diagnosis Date  . Allergy   . Clotting disorder (HCC)    blockage in artery lt. side neck  . Hyperlipidemia   . Hypertension   . Neuromuscular disorder ([place])    stroke lt. side affected  . Stroke [facility])     PAST SURGICAL HISTORY: No past surgical history on file.  FAMILY HISTORY: Family History  Problem Relation Age of Onset  . Diabetes Mother   . Colon cancer Father   . Esophageal cancer Neg Hx   . Rectal cancer Neg Hx   . Stomach cancer Neg Hx     SOCIAL HISTORY: Social History   Socioeconomic History  . Marital status: Married    Spouse name: [name]  . Number of children: 3  . Years of education: 2y coll  . Highest education level: Not on file  Occupational History  . Occupation: Disabled     Comment: not working at this time  Social Needs  .  Financial resource strain: Not on file  . Food insecurity    Worry: Not on file    Inability: Not on file  . Transportation needs    Medical: Not on file    Non-medical: Not on file  Tobacco Use  . Smoking status: Former Smoker    Packs/day: 1.00    Years: 15.00    Pack years: 15.00    Types: Cigarettes    Quit date: 06/26/2014    Years since quitting: 4.5  . Smokeless tobacco: Never Used  Substance and Sexual Activity  . Alcohol use: No    Alcohol/week: 0.0 standard drinks  . Drug use: No    Frequency: 3.0 times per week  . Sexual activity: Not on file  Lifestyle  . Physical activity    Days per week: Not on file    Minutes per session: Not  on file  . Stress: Not on file  Relationships  . Social Herbalist on phone: Not on file    Gets together: Not on file    Attends religious service: Not on file    Active member of club or organization: Not on file    Attends meetings of clubs or organizations: Not on file    Relationship status: Not on file  . Intimate partner violence    Fear of current or ex partner: Not on file    Emotionally abused: Not on file    Physically abused: Not on file    Forced sexual activity: Not on file  Other Topics Concern  . Not on file  Social History Narrative   Caffeine.1-2 drinks a day .     Physical exam General: well developed, well nourished, pleasant middle-aged African-American male, seated, in no evident distress Head: head normocephalic and atraumatic.    Neurologic Exam Mental Status: Awake and fully alert. Oriented to place and time. Recent and remote memory diminished. Attention span, concentration and fund of knowledge diminished. Mood and affect appropriate.  Cranial Nerves: Extraocular movements full without nystagmus. Hearing intact to voice. Facial sensation intact. Face, tongue, palate moves normally and symmetrically.  Shoulder shrug symmetric. Motor: No evidence of large muscle weakness per drift  assessment; decreased left hand finger dexterity; orbits right arm over left hand  Sensory.: intact to light touch Coordination: Rapid alternating movements normal in all extremities except decreased in left hand. Finger-to-nose showed dysmetria of left hand and heel-to-shin performed accurately. Gait and Station: Arises from chair without difficulty. Stance is normal. Gait demonstrates normal stride length and balance  Reflexes: UTA    DIAGNOSTIC DATA (LABS, IMAGING, TESTING) - I reviewed patient records, labs, notes, testing and imaging myself where available.  Lab Results  Component Value Date   WBC 5.7 07/25/2016   HGB 16.7 07/25/2016   HCT 45.7 07/25/2016   MCV 88.2 07/25/2016   PLT 198 07/25/2016      Component Value Date/Time   NA 137 07/25/2016 2257   K 4.6 07/25/2016 2257   CL 105 07/25/2016 2257   CO2 21 (L) 07/25/2016 2257   GLUCOSE 243 (H) 07/25/2016 2257   BUN 11 07/25/2016 2257   CREATININE 1.01 07/25/2016 2257   CREATININE 1.05 10/14/2014 1738   CALCIUM 10.0 07/25/2016 2257   PROT 7.2 07/25/2016 2257   ALBUMIN 4.7 07/25/2016 2257   AST 35 07/25/2016 2257   ALT 26 07/25/2016 2257   ALKPHOS 55 07/25/2016 2257   BILITOT 1.9 (H) 07/25/2016 2257   GFRNONAA >60 07/25/2016 2257   GFRAA >60 07/25/2016 2257   Lab Results  Component Value Date   CHOL 182 07/07/2014   HDL 24 (L) 07/07/2014   LDLCALC 135 (H) 07/07/2014   TRIG 116 07/07/2014   CHOLHDL 7.6 07/07/2014   Lab Results  Component Value Date   HGBA1C 5.6 07/07/2014   No results found for: [id number] Lab Results  Component Value Date   TSH 0.473 07/07/2014      ASSESSMENT AND PLAN 58 y.o. African American male with PMH of TIA, right carotid stenosis without intervention, smoker was admitted due to right MCA territory stroke. Found to have right ICA occlusion. He seems to have TIAs in 2010 and found to have right ICA stenosis at that time but no intervention done. His ICA occlusion likely due to  athero, but dissection is also possibility as left ICA was smooth. Repeat CTA  head and neck showed no recannulization. TCD MES negative. Has quit smoking. Stable mild left sided hemiparesis.  Following with Dr. Letta Pate for suprascapular nerve block. Sleep study did not show significant OSA.  Mild cognitive impairment likely due to stroke, however stable over time so far with ongoing use of Aricept 10 mg daily.   Cognitive impairment stable with ongoing use of Aricept 10 mg daily.  Wife and patient question possible worsening of left hand dexterity and dysmetria requiring more assistance with ADLs.  Plan:   -Continue ASA and lipitor for stroke prevention -Repeat CTA head/neck due to potential left upper extremity worsening -BP goal 120-140. Avoid low BP due to right ICA occlusion -does not currently monitor and recommended monitoring 1-2 times weekly to ensure stable levels -Stay well hydrated and avoid dehydration  -Continue to use a cane for safe ambulation -Continue aricept 10mg  -will obtain MMSE at follow-up visit [facility] -Follow up with your primary care physician for stroke risk factor modification. Recommend maintain blood pressure goal 130-140, diabetes with hemoglobin A1c goal below 6.5% and lipids with LDL cholesterol goal below 70 mg/dL.   Follow-up in 6 months or call earlier if needed  Greater than 50% of this 15-minute non-face-to-face visit was spent discussing residual deficits of left hemiparesis and cognitive deficits post stroke.  Discussion regarding ongoing management and evaluation and answering all questions to patient and wife satisfaction  Venancio Poisson, AGNP-BC  [facility] [address] [place], [place] [address]  Phone [phone] Fax [phone] Note: This document was prepared with digital dictation and possible smart phrase technology. Any transcriptional errors that result from this process are  unintentional.

## 2018-12-31 NOTE — Progress Notes (Signed)
I agree with the above plan 

## 2019-01-07 NOTE — Progress Notes (Signed)
Fax confirmation to Eleanor, REGINA, RN for DONEPEZIL [phone]. sy

## 2019-01-08 ENCOUNTER — Telehealth: Payer: Self-pay

## 2019-01-08 NOTE — Telephone Encounter (Signed)
Ariicept rx fax to [facility] by Katharine Look on 01/07/2019.

## 2019-07-07 ENCOUNTER — Ambulatory Visit: Payer: Medicare (Managed Care) | Admitting: Adult Health

## 2019-07-07 ENCOUNTER — Telehealth: Payer: Self-pay | Admitting: Adult Health

## 2019-07-07 ENCOUNTER — Telehealth: Payer: Self-pay | Admitting: *Deleted

## 2019-07-07 NOTE — Telephone Encounter (Signed)
LMVM for pt to return call to reschedule appt.

## 2019-07-07 NOTE — Telephone Encounter (Signed)
I called and LMVM for pt or family member to call and reschedule appt (strokef/u/ memory.  If feels like needs f/u may do VV. Attempted to call [facility]- closed.

## 2019-07-07 NOTE — Telephone Encounter (Signed)
Pt came in for appointment today, was informed that it needed to be r/s due to provider being out. Pt was informed that we had been trying to reach him this morning. He stated that he wants [place] to give him a call to r/s today's follow-up.

## 2019-07-16 ENCOUNTER — Telehealth: Payer: Medicare (Managed Care) | Admitting: Adult Health

## 2019-07-16 NOTE — Progress Notes (Deleted)
[facility]  PATIENT: Samuel Shaw DOB: [birth year]   REASON FOR VISIT: Follow-up for history of CVA mild cognitive impairment HISTORY FROM: Patient and wife  Virtual Visit via Video Note  I connected with [name] on 07/16/19 at 10:45 AM EST by a video enabled telemedicine application located at [facility] and verified that I am speaking with the correct person using two identifiers who was located at their own home accompanied by his wife.     Visit scheduled by office staff who discussed the limitations of evaluation and management by telemedicine and the availability of in person appointments. The patient expressed understanding and agreed to proceed.    HISTORY OF PRESENT ILLNESS:[name] is an 59 y.o. male PMH of TIA in 2010, right carotid stenosis but no intervention, former smoker and right MCA stroke in 07/2014.  He was admitted on 07/06/14 for subacute CVA in late 06/2014 in [place] with progressive left-sided weakness since the discharge. These symptoms are similar to his stroke in December, the family just feels they are getting worse. A review of records from [place] are pertinent for a head CT showing an acute right basal ganglia infarct and he is on ASA 325mg  daily. MRI showed multifocal subacute to remote infarct at right MCA territory (no new infarct) and CTA neck showed right ICA occlusion, athero vs. Dissection. He was continued on discharged to CIR .  Repeat CTA stable.  Interval History3/19/19 Dr. Erlinda Hong During the interval time, pt has been doing the same.  On Aricept 10 mg daily.  Currently on PACE program twice a week.  Followed with PACE  program for physical and mental exercises.  However, wife still complains of patient memory issues, forgot to turn off stove, leave the doors open, cannot find objects that he has misplaced, short memory difficulties.  He had evaluation with PACE program in 01/2017 showed MMSE 29/30,  MOCA 21/30.  In 10/2016, LDL 70, TSH 1.52, RPR negative, B12 1029.  Due to cognitive complaint, patient referred back for further evaluation.  Still on aspirin Lipitor for stroke prevention.   UPDATE 9/19/2019CM [name], 59 year old male returns for follow-up with his wife with history of CVA mild cognitive impairment.  He had sleep study in the past which ruled out obstructive sleep apnea.  He continues to go to PACE 2 times a week.  He continues to have weakness and uses a cane to ambulate.  He denies any falls.  He no longer gets any physical therapy.  He is able to perform his activities of daily living except dressing.  He is unable to prepare meals do any housekeeping longer, he does not drive he is not responsible for his medications and he is not able to handle money.  Wife reports that his short-term memory is still not good however on MMSE he does not miss any recall questions.  He remains on aspirin and Lipitor for secondary stroke prevention.  He has minimal bruising and no bleeding.  Appetite is reportedly good.  He denies any trouble sleeping although he does snore.  He returns for reevaluation  12/30/2018 VIRTUAL VISIT JM: [name] is a 59 year old male who is being seen today for follow-up regarding history of stroke with mild cognitive impairment and residual left hemiparesis. Memory has been stable with ongoing use of Aricept 10mg  daily without side effects. He does need assistance with bathing and dressing due to left hand weakness and safety precautions.  Wife and  patient are questioning possible worsening of left hand weakness with subjective LUE "shaking" symptoms which is worsened with activity.  He does need slightly more assistance with fine motor control tasks such as buttoning shirts or tying shoes.  Continues on aspirin and atorvastatin for secondary stroke prevention without reports of side effects. Blood pressure not recently monitored as previously stable.  No further concerns at  this time.   Virtual visit 07/16/2019 JM: [name] is a 59 year old male who is being seen today via virtual visit for stroke follow-up.  Residual stroke deficits of mild cognitive impairment and LUE weakness.  At prior visit, concern for possible worsening LUE weakness and reports of tremor.  It was recommended to repeat CTA head/neck for surveillance monitoring as well as subjective worsening of stroke symptoms but this has yet to be completed.  He has continued on aspirin and atorvastatin for secondary stroke prevention without side effects.  Blood pressure ***.       REVIEW OF SYSTEMS: Full 14 system review of systems performed and notable only for those listed, all others are neg:  Weakness, memory loss, confusion  ALLERGIES: No Known Allergies  HOME MEDICATIONS: Outpatient Medications Prior to Visit  Medication Sig Dispense Refill  . acetaminophen (TYLENOL) 325 MG tablet Take 2 tablets (650 mg total) by mouth every 6 (six) hours as needed for mild pain or headache.    Marland Kitchen aspirin EC 81 MG tablet Take 81 mg by mouth daily.    Marland Kitchen atorvastatin (LIPITOR) 20 MG tablet Take 1 tablet (20 mg total) by mouth daily at 6 PM. 30 tablet 1  . donepezil (ARICEPT) 10 MG tablet Take 1 tablet (10 mg total) by mouth at bedtime. 90 tablet 3  . fexofenadine (ALLEGRA) 30 MG tablet Take 60 mg by mouth every morning.     . gabapentin (NEURONTIN) 300 MG capsule take 1 capsule BY MOUTH THREE TIMES DAILY 90 capsule 0  . traZODone (DESYREL) 50 MG tablet Take 50 mg by mouth at bedtime.  1   No facility-administered medications prior to visit.    PAST MEDICAL HISTORY: Past Medical History:  Diagnosis Date  . Allergy   . Clotting disorder (HCC)    blockage in artery lt. side neck  . Hyperlipidemia   . Hypertension   . Neuromuscular disorder ([place])    stroke lt. side affected  . Stroke [facility])     PAST SURGICAL HISTORY: No past surgical history on file.  FAMILY HISTORY: Family History  Problem Relation  Age of Onset  . Diabetes Mother   . Colon cancer Father   . Esophageal cancer Neg Hx   . Rectal cancer Neg Hx   . Stomach cancer Neg Hx     SOCIAL HISTORY: Social History   Socioeconomic History  . Marital status: Married    Spouse name: [name]  . Number of children: 3  . Years of education: 2y coll  . Highest education level: Not on file  Occupational History  . Occupation: Disabled     Comment: not working at this time  Tobacco Use  . Smoking status: Former Smoker    Packs/day: 1.00    Years: 15.00    Pack years: 15.00    Types: Cigarettes    Quit date: 06/26/2014    Years since quitting: 5.0  . Smokeless tobacco: Never Used  Substance and Sexual Activity  . Alcohol use: No    Alcohol/week: 0.0 standard drinks  . Drug use: No  Frequency: 3.0 times per week  . Sexual activity: Not on file  Other Topics Concern  . Not on file  Social History Narrative   Caffeine.1-2 drinks a day .   Social Determinants of Health   Financial Resource Strain:   . Difficulty of Paying Living Expenses: Not on file  Food Insecurity:   . Worried About Charity fundraiser in the Last Year: Not on file  . Ran Out of Food in the Last Year: Not on file  Transportation Needs:   . Lack of Transportation (Medical): Not on file  . Lack of Transportation (Non-Medical): Not on file  Physical Activity:   . Days of Exercise per Week: Not on file  . Minutes of Exercise per Session: Not on file  Stress:   . Feeling of Stress : Not on file  Social Connections:   . Frequency of Communication with Friends and Family: Not on file  . Frequency of Social Gatherings with Friends and Family: Not on file  . Attends Religious Services: Not on file  . Active Member of Clubs or Organizations: Not on file  . Attends Archivist Meetings: Not on file  . Marital Status: Not on file  Intimate Partner Violence:   . Fear of Current or Ex-Partner: Not on file  . Emotionally Abused: Not on file  .  Physically Abused: Not on file  . Sexually Abused: Not on file     Physical exam General: well developed, well nourished, pleasant middle-aged African-American male, seated, in no evident distress Head: head normocephalic and atraumatic.    Neurologic Exam Mental Status: Awake and fully alert. Oriented to place and time. Recent and remote memory diminished. Attention span, concentration and fund of knowledge diminished. Mood and affect appropriate.  Cranial Nerves: Extraocular movements full without nystagmus. Hearing intact to voice. Facial sensation intact. Face, tongue, palate moves normally and symmetrically.  Shoulder shrug symmetric. Motor: No evidence of large muscle weakness per drift assessment; decreased left hand finger dexterity; orbits right arm over left hand  Sensory.: intact to light touch Coordination: Rapid alternating movements normal in all extremities except decreased in left hand. Finger-to-nose showed dysmetria of left hand and heel-to-shin performed accurately. Gait and Station: Arises from chair without difficulty. Stance is normal. Gait demonstrates normal stride length and balance  Reflexes: UTA    DIAGNOSTIC DATA (LABS, IMAGING, TESTING) - I reviewed patient records, labs, notes, testing and imaging myself where available.  Lab Results  Component Value Date   WBC 5.7 07/25/2016   HGB 16.7 07/25/2016   HCT 45.7 07/25/2016   MCV 88.2 07/25/2016   PLT 198 07/25/2016      Component Value Date/Time   NA 137 07/25/2016 2257   K 4.6 07/25/2016 2257   CL 105 07/25/2016 2257   CO2 21 (L) 07/25/2016 2257   GLUCOSE 243 (H) 07/25/2016 2257   BUN 11 07/25/2016 2257   CREATININE 1.01 07/25/2016 2257   CREATININE 1.05 10/14/2014 1738   CALCIUM 10.0 07/25/2016 2257   PROT 7.2 07/25/2016 2257   ALBUMIN 4.7 07/25/2016 2257   AST 35 07/25/2016 2257   ALT 26 07/25/2016 2257   ALKPHOS 55 07/25/2016 2257   BILITOT 1.9 (H) 07/25/2016 2257   GFRNONAA >60 07/25/2016  2257   GFRAA >60 07/25/2016 2257   Lab Results  Component Value Date   CHOL 182 07/07/2014   HDL 24 (L) 07/07/2014   LDLCALC 135 (H) 07/07/2014   TRIG 116 07/07/2014  CHOLHDL 7.6 07/07/2014   Lab Results  Component Value Date   HGBA1C 5.6 07/07/2014   No results found for: [id number] Lab Results  Component Value Date   TSH 0.473 07/07/2014      ASSESSMENT AND PLAN 59 y.o. African American male with PMH of TIA in 2010, right carotid stenosis without intervention, former smoker and right MCA stroke with right ICA occlusion and ***.  was admitted due to right MCA territory stroke. Found to have right ICA occlusion.  Underwent sleep apnea testing which did not show significant OSA.  Residual deficits of mild cognitive impairment which has been stable with ongoing use of Aricept 10 mg daily. Wife and patient question possible worsening of left hand dexterity and dysmetria requiring more assistance with ADLs.     Plan:   -Continue ASA and lipitor for stroke prevention -Repeat CTA head/neck due to potential left upper extremity worsening -BP goal 120-140. Avoid low BP due to right ICA occlusion -does not currently monitor and recommended monitoring 1-2 times weekly to ensure stable levels -Stay well hydrated and avoid dehydration  -Continue to use a cane for safe ambulation -Continue aricept 10mg  -will obtain MMSE at follow-up visit [facility]  [facility] program -Follow up with your primary care physician for stroke risk factor modification. Recommend maintain blood pressure goal 130-140, diabetes with hemoglobin A1c goal below 6.5% and lipids with LDL cholesterol goal below 70 mg/dL.   Follow-up in 6 months or call earlier if needed    Greater than 50% of this 15-minute non-face-to-face visit was spent discussing residual deficits of left hemiparesis and cognitive deficits post stroke.  Discussion regarding ongoing management and evaluation and answering all questions to patient and  wife satisfaction    Frann Rider, [facility]  [facility] [address] [place], [place] [address]  Phone [phone] Fax [phone] Note: This document was prepared with digital dictation and possible smart phrase technology. Any transcriptional errors that result from this process are unintentional.

## 2019-08-20 ENCOUNTER — Ambulatory Visit (INDEPENDENT_AMBULATORY_CARE_PROVIDER_SITE_OTHER): Payer: Medicare (Managed Care) | Admitting: Adult Health

## 2019-08-20 ENCOUNTER — Other Ambulatory Visit: Payer: Self-pay

## 2019-08-20 ENCOUNTER — Encounter: Payer: Self-pay | Admitting: Adult Health

## 2019-08-20 ENCOUNTER — Telehealth: Payer: Self-pay

## 2019-08-20 DIAGNOSIS — I6521 Occlusion and stenosis of right carotid artery: Secondary | ICD-10-CM

## 2019-08-20 DIAGNOSIS — E785 Hyperlipidemia, unspecified: Secondary | ICD-10-CM | POA: Diagnosis not present

## 2019-08-20 DIAGNOSIS — I10 Essential (primary) hypertension: Secondary | ICD-10-CM

## 2019-08-20 DIAGNOSIS — I63231 Cerebral infarction due to unspecified occlusion or stenosis of right carotid arteries: Secondary | ICD-10-CM | POA: Diagnosis not present

## 2019-08-20 DIAGNOSIS — I69319 Unspecified symptoms and signs involving cognitive functions following cerebral infarction: Secondary | ICD-10-CM

## 2019-08-20 DIAGNOSIS — I69354 Hemiplegia and hemiparesis following cerebral infarction affecting left non-dominant side: Secondary | ICD-10-CM

## 2019-08-20 MED ORDER — DONEPEZIL HCL 23 MG PO TABS: 23.0000 mg | ORAL_TABLET | Freq: Every day | ORAL | 3 refills | Status: AC

## 2019-08-20 NOTE — Telephone Encounter (Signed)
Prescription has been faxed over to [facility]. Confirmation fax has been received.

## 2019-08-20 NOTE — Telephone Encounter (Signed)
[facility] fax # [phone]

## 2019-08-20 NOTE — Telephone Encounter (Signed)
Unable to get in contact with the nurse at [facility]. LVM asking her to return my call so that I could get the fax number for his prescription. Office number was provided.    If the nurse calls back please get the fax number so that his prescription can faxed over.

## 2019-08-20 NOTE — Progress Notes (Deleted)
[facility]  PATIENT: Samuel Shaw DOB: [birth year]   REASON FOR VISIT: Follow-up for history of CVA mild cognitive impairment HISTORY FROM: Patient and wife  Chief Complaint  Patient presents with  . Follow-up     HISTORY OF PRESENT ILLNESS:  Update 08/20/2019: Samuel Shaw is a 59 year old male who is being seen today for stroke follow-up.  Previously seen on 12/30/2018 with concerns of worsening LUE weakness therefore recommended CTA head/neck to assess for new stroke and prior history of ICA stenosis but has not been done at this time.  Residual stroke deficits of cognitive impairment and left hemiparesis.  MMSE today ***.  Continues on Aricept tolerating well.  Continues on aspirin and atorvastatin for secondary stroke prevention without side effects.  Blood pressure today ***.  No further concerns at this time.     History copied for reference purposes only Samuel Shaw is an 59 y.o. male PMH of 2 TIAs 2 years ago, right carotid stenosis but no intervention, and current smoker was admitted on 07/06/14 for subacute CVA in late 06/2014 in [place] with progressive left-sided weakness since the discharge. These symptoms are similar to his stroke in December, the family just feels they are getting worse. A review of records from [place] are pertinent for a head CT showing an acute right basal ganglia infarct and he is on ASA 325mg  daily. MRI showed multifocal subacute to remote infarct at right MCA territory (no new infarct) and CTA neck showed right ICA occlusion, athero vs. Dissection. He was continued on discharged to CIR .  Repeat CTA stable.  Interval History3/19/19 Dr. Erlinda Hong During the interval time, pt has been doing the same.  On Aricept 10 mg daily.  Currently on PACE program twice a week.  Followed with PACE  program for physical and mental exercises.  However, wife still complains of patient memory issues, forgot to turn off stove, leave the doors open, cannot  find objects that he has misplaced, short memory difficulties.  He had evaluation with PACE program in 01/2017 showed MMSE 29/30, MOCA 21/30.  In 10/2016, LDL 70, TSH 1.52, RPR negative, B12 1029.  Due to cognitive complaint, patient referred back for further evaluation.  Still on aspirin Lipitor for stroke prevention.   UPDATE 9/19/2019CM Samuel Shaw, 59 year old male returns for follow-up with his wife with history of CVA mild cognitive impairment.  He had sleep study in the past which ruled out obstructive sleep apnea.  He continues to go to PACE 2 times a week.  He continues to have weakness and uses a cane to ambulate.  He denies any falls.  He no longer gets any physical therapy.  He is able to perform his activities of daily living except dressing.  He is unable to prepare meals do any housekeeping longer, he does not drive he is not responsible for his medications and he is not able to handle money.  Wife reports that his short-term memory is still not good however on MMSE he does not miss any recall questions.  He remains on aspirin and Lipitor for secondary stroke prevention.  He has minimal bruising and no bleeding.  Appetite is reportedly good.  He denies any trouble sleeping although he does snore.  He returns for reevaluation  12/30/2018 VIRTUAL VISIT: Samuel Shaw is a 59 year old male who is being seen today for follow-up regarding history of stroke with mild cognitive impairment and residual left hemiparesis. Memory has been stable with ongoing use of Aricept  10mg  daily without side effects. He does need assistance with bathing and dressing due to left hand weakness and safety precautions.  Wife and patient are questioning possible worsening of left hand weakness with subjective LUE "shaking" symptoms which is worsened with activity.  He does need slightly more assistance with fine motor control tasks such as buttoning shirts or tying shoes.  Continues on aspirin and atorvastatin for secondary stroke  prevention without reports of side effects. Blood pressure not recently monitored as previously stable.  No further concerns at this time.        REVIEW OF SYSTEMS: Full 14 system review of systems performed and notable only for those listed, all others are neg:  Weakness, memory loss, confusion  ALLERGIES: No Known Allergies  HOME MEDICATIONS: Outpatient Medications Prior to Visit  Medication Sig Dispense Refill  . acetaminophen (TYLENOL) 325 MG tablet Take 2 tablets (650 mg total) by mouth every 6 (six) hours as needed for mild pain or headache.    Marland Kitchen aspirin EC 81 MG tablet Take 81 mg by mouth daily.    Marland Kitchen atorvastatin (LIPITOR) 20 MG tablet Take 1 tablet (20 mg total) by mouth daily at 6 PM. 30 tablet 1  . donepezil (ARICEPT) 10 MG tablet Take 1 tablet (10 mg total) by mouth at bedtime. 90 tablet 3  . fexofenadine (ALLEGRA) 30 MG tablet Take 60 mg by mouth every morning.     . gabapentin (NEURONTIN) 300 MG capsule take 1 capsule BY MOUTH THREE TIMES DAILY 90 capsule 0  . traZODone (DESYREL) 50 MG tablet Take 50 mg by mouth at bedtime.  1   No facility-administered medications prior to visit.    PAST MEDICAL HISTORY: Past Medical History:  Diagnosis Date  . Allergy   . Clotting disorder (HCC)    blockage in artery lt. side neck  . Hyperlipidemia   . Hypertension   . Neuromuscular disorder ([place])    stroke lt. side affected  . Stroke [facility])     PAST SURGICAL HISTORY: No past surgical history on file.  FAMILY HISTORY: Family History  Problem Relation Age of Onset  . Diabetes Mother   . Colon cancer Father   . Esophageal cancer Neg Hx   . Rectal cancer Neg Hx   . Stomach cancer Neg Hx     SOCIAL HISTORY: Social History   Socioeconomic History  . Marital status: Married    Spouse name: [name]  . Number of children: 3  . Years of education: 2y coll  . Highest education level: Not on file  Occupational History  . Occupation: Disabled     Comment: not working  at this time  Tobacco Use  . Smoking status: Former Smoker    Packs/day: 1.00    Years: 15.00    Pack years: 15.00    Types: Cigarettes    Quit date: 06/26/2014    Years since quitting: 5.1  . Smokeless tobacco: Never Used  Substance and Sexual Activity  . Alcohol use: No    Alcohol/week: 0.0 standard drinks  . Drug use: No    Frequency: 3.0 times per week  . Sexual activity: Not on file  Other Topics Concern  . Not on file  Social History Narrative   Caffeine.1-2 drinks a day .   Social Determinants of Health   Financial Resource Strain:   . Difficulty of Paying Living Expenses: Not on file  Food Insecurity:   . Worried About Charity fundraiser in the  Last Year: Not on file  . Ran Out of Food in the Last Year: Not on file  Transportation Needs:   . Lack of Transportation (Medical): Not on file  . Lack of Transportation (Non-Medical): Not on file  Physical Activity:   . Days of Exercise per Week: Not on file  . Minutes of Exercise per Session: Not on file  Stress:   . Feeling of Stress : Not on file  Social Connections:   . Frequency of Communication with Friends and Family: Not on file  . Frequency of Social Gatherings with Friends and Family: Not on file  . Attends Religious Services: Not on file  . Active Member of Clubs or Organizations: Not on file  . Attends Archivist Meetings: Not on file  . Marital Status: Not on file  Intimate Partner Violence:   . Fear of Current or Ex-Partner: Not on file  . Emotionally Abused: Not on file  . Physically Abused: Not on file  . Sexually Abused: Not on file     Physical exam General: well developed, well nourished, pleasant middle-aged African-American male, seated, in no evident distress Head: head normocephalic and atraumatic.    Neurologic Exam Mental Status: Awake and fully alert. Oriented to place and time. Recent and remote memory diminished. Attention span, concentration and fund of knowledge  diminished. Mood and affect appropriate.  Cranial Nerves: Extraocular movements full without nystagmus. Hearing intact to voice. Facial sensation intact. Face, tongue, palate moves normally and symmetrically.  Shoulder shrug symmetric. Motor: No evidence of large muscle weakness per drift assessment; decreased left hand finger dexterity; orbits right arm over left hand  Sensory.: intact to light touch Coordination: Rapid alternating movements normal in all extremities except decreased in left hand. Finger-to-nose showed dysmetria of left hand and heel-to-shin performed accurately. Gait and Station: Arises from chair without difficulty. Stance is normal. Gait demonstrates normal stride length and balance  Reflexes: UTA    DIAGNOSTIC DATA (LABS, IMAGING, TESTING) - I reviewed patient records, labs, notes, testing and imaging myself where available.  Lab Results  Component Value Date   WBC 5.7 07/25/2016   HGB 16.7 07/25/2016   HCT 45.7 07/25/2016   MCV 88.2 07/25/2016   PLT 198 07/25/2016      Component Value Date/Time   NA 137 07/25/2016 2257   K 4.6 07/25/2016 2257   CL 105 07/25/2016 2257   CO2 21 (L) 07/25/2016 2257   GLUCOSE 243 (H) 07/25/2016 2257   BUN 11 07/25/2016 2257   CREATININE 1.01 07/25/2016 2257   CREATININE 1.05 10/14/2014 1738   CALCIUM 10.0 07/25/2016 2257   PROT 7.2 07/25/2016 2257   ALBUMIN 4.7 07/25/2016 2257   AST 35 07/25/2016 2257   ALT 26 07/25/2016 2257   ALKPHOS 55 07/25/2016 2257   BILITOT 1.9 (H) 07/25/2016 2257   GFRNONAA >60 07/25/2016 2257   GFRAA >60 07/25/2016 2257   Lab Results  Component Value Date   CHOL 182 07/07/2014   HDL 24 (L) 07/07/2014   LDLCALC 135 (H) 07/07/2014   TRIG 116 07/07/2014   CHOLHDL 7.6 07/07/2014   Lab Results  Component Value Date   HGBA1C 5.6 07/07/2014   No results found for: [id number] Lab Results  Component Value Date   TSH 0.473 07/07/2014      ASSESSMENT AND PLAN 59 y.o. African American male  with PMH of TIA, right carotid stenosis without intervention, smoker was admitted due to right MCA territory stroke. Found to have  right ICA occlusion versus questionable dissection.  Repeat CTA head and neck showed no recannulization. Stable mild left sided hemiparesis.  Mild cognitive impairment likely due to stroke, however stable over time so far with ongoing use of Aricept 10 mg daily.  Wife and patient question possible worsening of left hand dexterity and dysmetria requiring more assistance with ADLs.  Plan:   -Continue ASA and lipitor for stroke prevention -Repeat CTA head/neck due to potential left upper extremity worsening -BP goal 120-140. Avoid low BP due to right ICA occlusion -does not currently monitor and recommended monitoring 1-2 times weekly to ensure stable levels -Stay well hydrated and avoid dehydration  -Continue to use a cane for safe ambulation -Continue aricept 10mg  -will obtain MMSE at follow-up visit [facility] program -Follow up with your primary care physician for stroke risk factor modification. Recommend maintain blood pressure goal 130-140, diabetes with hemoglobin A1c goal below 6.5% and lipids with LDL cholesterol goal below 70 mg/dL.    Follow-up in 6 months or call earlier if needed   Greater than 50% of this 15-minute non-face-to-face visit was spent discussing residual deficits of left hemiparesis and cognitive deficits post stroke.  Discussion regarding ongoing management and evaluation and answering all questions to patient and wife satisfaction   Frann Rider, [facility]  [facility] [address] [place], [place] [address]  Phone [phone] Fax [phone] Note: This document was prepared with digital dictation and possible smart phrase technology. Any transcriptional errors that result from this process are unintentional.

## 2019-08-20 NOTE — Progress Notes (Addendum)
[facility]  PATIENT: Samuel FLUHARTY DOB: [birth year]   REASON FOR VISIT: Follow-up for history of CVA mild cognitive impairment HISTORY FROM: Patient and wife  Chief Complaint  Patient presents with   Follow-up     HISTORY OF PRESENT ILLNESS:  Update 08/20/2019: [name] is a 59 year old male who is being seen today for stroke follow-up. His wife is also present for today's visit. Previously seen on 12/30/2018 with concerns of worsening LUE weakness therefore recommended CTA head/neck to assess for new stroke and prior history of ICA stenosis but has not been done at this time. Order for CTA head/neck still needs to be completed. Residual stroke deficits of cognitive impairment and left hemiparesis. MMSE today 22/30. Continues on Aricept tolerating well but feels as though his short term memory has worsened. Continues on aspirin and atorvastatin for secondary stroke prevention without side effects. Last lipid panel was collected by PCP over a year ago. Will recheck a lipid panel today. Blood pressure today 144/97. No further concerns at this time.    History copied for reference purposes only Samuel Shaw is an 59 y.o. male PMH of 2 TIAs 2 years ago, right carotid stenosis but no intervention, and current smoker was admitted on 07/06/14 for subacute CVA in late 06/2014 in [place] with progressive left-sided weakness since the discharge. These symptoms are similar to his stroke in December, the family just feels they are getting worse. A review of records from  [place] are pertinent for a head CT showing an acute right basal ganglia infarct and he is on ASA 325mg  daily. MRI showed multifocal subacute to remote infarct at right MCA territory (no new infarct) and CTA neck showed right ICA occlusion, athero vs. Dissection. He was continued on discharged to CIR .  Repeat CTA stable.  Interval History3/19/19 Dr. Erlinda Hong During the interval time, pt has been doing the same.  On  Aricept 10 mg daily.  Currently on PACE program twice a week.  Followed with PACE  program for physical and mental exercises.  However, wife still complains of patient memory issues, forgot to turn off stove, leave the doors open, cannot find objects that he has misplaced, short memory difficulties.  He had evaluation with PACE program in 01/2017 showed MMSE 29/30, MOCA 21/30.  In 10/2016, LDL 70, TSH 1.52, RPR negative, B12 1029.  Due to cognitive complaint, patient referred back for further evaluation.  Still on aspirin Lipitor for stroke prevention.   UPDATE 9/19/2019CM [name], 59 year old male returns for follow-up with his wife with history of CVA mild cognitive impairment.  He had sleep study in the past which ruled out obstructive sleep apnea.  He continues to go to PACE 2 times a week.  He continues to have weakness and uses a cane to ambulate.  He denies any falls.  He no longer gets any physical therapy.  He is able to perform his activities of daily living except dressing.  He is unable to prepare meals do any housekeeping longer, he does not drive he is not responsible for his medications and he is not able to handle money.  Wife reports that his short-term memory is still not good however on MMSE he does not miss any recall questions.  He remains on aspirin and Lipitor for secondary stroke prevention.  He has minimal bruising and no bleeding.  Appetite is reportedly good.  He denies any trouble sleeping although he does snore.  He returns for reevaluation  12/30/2018 VIRTUAL VISIT: [name] is a 59 year old male who is being seen today for follow-up regarding history of stroke with mild cognitive impairment and residual left hemiparesis. Memory has been stable with ongoing use of Aricept 10mg  daily without side effects. He does need assistance with bathing and dressing due to left hand weakness and safety precautions.  Wife and patient are questioning possible worsening of left hand weakness with  subjective LUE "shaking" symptoms which is worsened with activity.  He does need slightly more assistance with fine motor control tasks such as buttoning shirts or tying shoes.  Continues on aspirin and atorvastatin for secondary stroke prevention without reports of side effects. Blood pressure not recently monitored as previously stable.  No further concerns at this time.        REVIEW OF SYSTEMS: Full 14 system review of systems performed and notable only for those listed, all others are neg:  Weakness, short term memory loss  ALLERGIES: No Known Allergies  HOME MEDICATIONS: Outpatient Medications Prior to Visit  Medication Sig Dispense Refill   acetaminophen (TYLENOL) 325 MG tablet Take 2 tablets (650 mg total) by mouth every 6 (six) hours as needed for mild pain or headache.     aspirin EC 81 MG tablet Take 81 mg by mouth daily.     atorvastatin (LIPITOR) 20 MG tablet Take 1 tablet (20 mg total) by mouth daily at 6 PM. 30 tablet 1   fexofenadine (ALLEGRA) 30 MG tablet Take 60 mg by mouth every morning.      gabapentin (NEURONTIN) 300 MG capsule take 1 capsule BY MOUTH THREE TIMES DAILY 90 capsule 0   traZODone (DESYREL) 50 MG tablet Take 50 mg by mouth at bedtime.  1   donepezil (ARICEPT) 10 MG tablet Take 1 tablet (10 mg total) by mouth at bedtime. 90 tablet 3   No facility-administered medications prior to visit.    PAST MEDICAL HISTORY: Past Medical History:  Diagnosis Date   Allergy    Clotting disorder ([place])    blockage in artery lt. side neck   Hyperlipidemia    Hypertension    Neuromuscular disorder (HCC)    stroke lt. side affected   Stroke ([place])     PAST SURGICAL HISTORY: No past surgical history on file.  FAMILY HISTORY: Family History  Problem Relation Age of Onset   Diabetes Mother    Colon cancer Father    Esophageal cancer Neg Hx    Rectal cancer Neg Hx    Stomach cancer Neg Hx     SOCIAL HISTORY: Social History   Socioeconomic History    Marital status: Married    Spouse name: Programmer, multimedia   Number of children: 3   Years of education: 2y coll   Highest education level: Not on file  Occupational History   Occupation: Disabled     Comment: not working at this time  Tobacco Use   Smoking status: Former Smoker    Packs/day: 1.00    Years: 15.00    Pack years: 15.00    Types: Cigarettes    Quit date: 06/26/2014    Years since quitting: 5.1   Smokeless tobacco: Never Used  Substance and Sexual Activity   Alcohol use: No    Alcohol/week: 0.0 standard drinks   Drug use: No    Frequency: 3.0 times per week   Sexual activity: Not on file  Other Topics Concern   Not on file  Social History Narrative   Caffeine.1-2 drinks a  day .   Social Determinants of Health   Financial Resource Strain:    Difficulty of Paying Living Expenses: Not on file  Food Insecurity:    Worried About Charity fundraiser in the Last Year: Not on file   [employer] of Food in the Last Year: Not on file  Transportation Needs:    Lack of Transportation (Medical): Not on file   Lack of Transportation (Non-Medical): Not on file  Physical Activity:    Days of Exercise per Week: Not on file   Minutes of Exercise per Session: Not on file  Stress:    Feeling of Stress : Not on file  Social Connections:    Frequency of Communication with Friends and Family: Not on file   Frequency of Social Gatherings with Friends and Family: Not on file   Attends Religious Services: Not on file   Active Member of Clubs or Organizations: Not on file   Attends Archivist Meetings: Not on file   Marital Status: Not on file  Intimate Partner Violence:    Fear of Current or Ex-Partner: Not on file   Emotionally Abused: Not on file   Physically Abused: Not on file   Sexually Abused: Not on file     Physical exam  There were no vitals filed for this visit. There is no height or weight on file to calculate BMI.  General: well developed, well nourished,   pleasant middle-aged African-American male, seated, in no evident distress Head: head normocephalic and atraumatic.   Neck: supple with no carotid or supraclavicular bruits Cardiovascular: regular rate and rhythm, no murmurs Musculoskeletal: no deformity Skin:  no rash/petichiae Vascular:  Normal pulses all extremities   Neurologic Exam Mental Status: Awake and fully alert.  Mood and affect appropriate.  MMSE - Mini Mental State Exam 08/20/2019 03/21/2018 09/18/2017  Orientation to time 3 4 4   Orientation to Place 4 5 5   Registration 3 3 3   Attention/ Calculation 1 3 3   Recall 3 3 3   Language- name 2 objects 2 2 2   Language- repeat 1 0 1  Language- follow 3 step command 3 3 3   Language- read & follow direction 1 1 1   Write a sentence 1 1 1   Copy design 0 0 0  Total score 22 25 26     Cranial Nerves: Pupils equal, briskly reactive to light. Extraocular movements full without nystagmus. Visual fields full to confrontation. Hearing intact. Facial sensation intact. Face, tongue, palate moves normally and symmetrically.  Motor: RUE and RLE 5/5, LUE 4/5 with right shoulder weakness, and LLE 4/5 with footdropt; decreased left hand finger dexterity; orbits right arm over left hand  Sensory.: intact to touch , pinprick , position and vibratory sensation.  Coordination: Rapid alternating movements normal in all extremities. Finger-to-nose and heel-to-shin performed accurately bilaterally. Gait and Station: Arises from chair without difficulty. Stance is normal. Gait demonstrates left hemiplegic gait with use of cane Reflexes: 1+ and symmetric. Toes downgoing.        ASSESSMENT AND PLAN 59 y.o. African American male with PMH of TIA, right carotid stenosis without intervention, smoker was admitted due to right MCA territory stroke. Found to have right ICA occlusion versus questionable dissection.  Will get a repeat CTA head and neck to assess for new stroke and prior history of ICA stenosis.  Stable mild left sided hemiparesis. Mild cognitive impairment likely due to stroke, however has worsened since June over time despite ongoing use of Aricept  10 mg daily. MMSE 22 (prior 25)   Plan:   -Continue ASA and lipitor for stroke prevention -Will recheck lipid panel today but advised ongoing monitoring by PCP -Repeat CTA head/neck for surveillance monitoring of known R ICA stenosis - order placed at prior visit and provided wife with  imaging number to call for appt -Stay well hydrated and avoid dehydration  -Continue to use a cane for safe ambulation -Increased aricept to 23 mg daily for cognitive impairment  -Continue  PACE program -Follow up with your primary care physician for stroke risk factor modification. Recommend maintain blood pressure goal 120-140, diabetes with hemoglobin A1c goal below 6.5% and lipids with LDL cholesterol goal below 70 mg/dL.     Follow-up in 6 months or call earlier if needed   Greater than 50% of this 30-minute face-to-face visit was spent discussing residual deficits of left hemiparesis and cognitive deficits post stroke with subjective slow worsening, performing and reviewing MMSE, discussion regarding increase of Aricept, discussion regarding known carotid stenosis and indication for repeat imaging, discussion regarding ongoing management and evaluation and answering all questions to patient and wife satisfaction.   Frann Rider, AGNP-BC  [facility] [address] [place], [place] [address]  Phone [phone] Fax [phone] Note: This document was prepared with digital dictation and possible smart phrase technology. Any transcriptional errors that result from this process are unintentional.

## 2019-08-20 NOTE — Patient Instructions (Signed)
Continue aspirin 81 mg daily  and atorvastatin  for secondary stroke prevention  Continue to follow up with PCP regarding cholesterol and blood pressure management   We will check cholesterol levels today to ensure satisfactory management but please follow up with your PCP for ongoing monitoring  Continue aricept 10mg  daily for cognitive impairment   Recommend obtaining CTA head/neck - you will be called to schedule study   Continue to monitor blood pressure at home  Maintain strict control of hypertension with blood pressure goal below 130/90, diabetes with hemoglobin A1c goal below 6.5% and cholesterol with LDL cholesterol (bad cholesterol) goal below 70 mg/dL. I also advised the patient to eat a healthy diet with plenty of whole grains, cereals, fruits and vegetables, exercise regularly and maintain ideal body weight.  Followup in the future with me in 6 months or call earlier if needed       Thank you for coming to see [place] at [facility]. I hope we have been able to provide you high quality care today.  You may receive a patient satisfaction survey over the next few weeks. We would appreciate your feedback and comments so that we may continue to improve ourselves and the health of our patients.

## 2019-08-21 LAB — LIPID PANEL
Chol/HDL Ratio: 4.8 ratio (ref 0.0–5.0)
Cholesterol, Total: 114 mg/dL (ref 100–199)
HDL: 24 mg/dL — ABNORMAL LOW (ref >39)
LDL Chol Calc (NIH): 54 mg/dL (ref 0–99)
Triglycerides: 223 mg/dL — ABNORMAL HIGH (ref 0–149)
VLDL Cholesterol Cal: 36 mg/dL (ref 5–40)

## 2019-08-21 NOTE — Progress Notes (Signed)
I agree with the above plan 

## 2019-08-28 ENCOUNTER — Telehealth: Payer: Self-pay | Admitting: *Deleted

## 2019-08-28 NOTE — Telephone Encounter (Signed)
Called the patient, wife answered and put phone on speaker. Informed  that cholesterol profile is satisfactory except triglycerides are elevated. He needs to see his primary care physician for further advice for this. Wife verbalized understanding, appreciation. She then asked about his MRA. According to NP's note she was going to repeat CTA head'/neck, order was placed last year. Wife stated she called [facility], was told the order needs to be placed. I advised wife I'll call and check on it; order authorized until 12/30/2019. I called [employer], spoke with Manus Gunning who stated they have called patient twice and left messages to call back and schedule MRA head/neck. They have orders. Called wife and advised her to call [facility], [phone], option #3. She stated she will call now,  verbalized understanding, appreciation.

## 2019-08-28 NOTE — Progress Notes (Signed)
Kindly inform the patient that cholesterol profile is satisfactory except triglycerides are elevated.  He needs to see his primary care physician for further advice for this.

## 2019-09-08 ENCOUNTER — Ambulatory Visit
Admission: RE | Admit: 2019-09-08 | Discharge: 2019-09-08 | Disposition: A | Discharge status: Home / Self Care | Payer: No Typology Code available for payment source | Source: Ambulatory Visit | Attending: Adult Health | Admitting: Adult Health

## 2019-09-08 MED ORDER — IOPAMIDOL (ISOVUE-370) INJECTION 76%
75.0000 mL | Freq: Once | INTRAVENOUS | Status: AC | PRN
  Administered 2019-09-08: 75 mL via INTRAVENOUS

## 2019-09-09 ENCOUNTER — Telehealth: Payer: Self-pay | Admitting: *Deleted

## 2019-09-09 NOTE — Telephone Encounter (Signed)
-----   Message from Frann Rider, NP sent at 09/08/2019  4:21 PM EST ----- Please advise patient/wife that recent imaging did not show evidence of a new stroke as well as stable appearance of left ICA occlusion.  No concerning findings in regards to reported worsening LUE weakness and cognition.  Mild to moderate intracranial stenosis -importance of managing risk factors, healthy diet and adequate exercise

## 2019-09-09 NOTE — Telephone Encounter (Signed)
I called pt, spoke to wife, bobbie.  I relayed the results of CTA per below. She verbalized understanding.  She asked about prescription of donepezil 23mg  po qhs. I called and spoke to [place] at [facility].  She did not receive.  I refaxed to them at [phone] with confirmation by fax.

## 2019-09-11 ENCOUNTER — Telehealth: Payer: Self-pay | Admitting: Adult Health

## 2019-09-11 NOTE — Telephone Encounter (Signed)
error 

## 2019-09-30 ENCOUNTER — Other Ambulatory Visit: Payer: Self-pay | Admitting: Internal Medicine

## 2019-09-30 DIAGNOSIS — Z87891 Personal history of nicotine dependence: Secondary | ICD-10-CM

## 2019-09-30 DIAGNOSIS — R911 Solitary pulmonary nodule: Secondary | ICD-10-CM

## 2019-10-13 ENCOUNTER — Ambulatory Visit
Admission: RE | Admit: 2019-10-13 | Discharge: 2019-10-13 | Disposition: A | Discharge status: Home / Self Care | Payer: No Typology Code available for payment source | Source: Ambulatory Visit | Attending: Internal Medicine | Admitting: Internal Medicine

## 2019-10-13 DIAGNOSIS — Z87891 Personal history of nicotine dependence: Secondary | ICD-10-CM

## 2019-10-13 DIAGNOSIS — R911 Solitary pulmonary nodule: Secondary | ICD-10-CM

## 2020-02-17 ENCOUNTER — Ambulatory Visit: Payer: Medicare (Managed Care) | Admitting: Adult Health

## 2020-02-18 ENCOUNTER — Ambulatory Visit: Payer: Medicare (Managed Care) | Admitting: Adult Health

## 2020-02-18 ENCOUNTER — Encounter: Payer: Self-pay | Admitting: Adult Health

## 2020-02-18 NOTE — Progress Notes (Deleted)
[facility]  Samuel Shaw: Samuel Shaw DOB: 07/26/1960   REASON FOR VISIT: Follow-up for history of CVA mild cognitive impairment HISTORY FROM: Samuel Shaw and wife  No chief complaint on file.    HISTORY OF PRESENT ILLNESS:  Today, 02/18/2020, Samuel Shaw returns for 105-month follow-up  Cognitive impairment -MMSE today -Remains on Aricept  History of stroke -Remains on aspirin and atorvastatin without side effects -Blood pressure today -Prior lipid panel 08/20/2019 showed LDL 54 and elevated triglycerides at 223 and advised to follow-up with PCP for triglyceride management       History provided for reference purposes only Update 08/20/2019: Samuel Shaw is a 59 year old male who is being seen today for stroke follow-up. His wife is also present for today's visit. Previously seen on 12/30/2018 with concerns of worsening LUE weakness therefore recommended CTA head/neck to assess for new stroke and prior history of ICA stenosis but has not been done at this time. Order for CTA head/neck still needs to be completed. Residual stroke deficits of cognitive impairment and left hemiparesis. MMSE today 22/30. Continues on Aricept tolerating well but feels as though his short term memory has worsened. Continues on aspirin and atorvastatin for secondary stroke prevention without side effects. Last lipid panel was collected by PCP over a year ago. Will recheck a lipid panel today. Blood pressure today 144/97. No further concerns at this time.  12/30/2018 VIRTUAL VISIT: Samuel Shaw is a 59 year old male who is being seen today for follow-up regarding history of stroke with mild cognitive impairment and residual left hemiparesis. Memory has been stable with ongoing use of Aricept 10mg  daily without side effects. He does need assistance with bathing and dressing due to left hand weakness and safety precautions.  Wife and Samuel Shaw are questioning possible worsening of left hand weakness with  subjective LUE "shaking" symptoms which is worsened with activity.  He does need slightly more assistance with fine motor control tasks such as buttoning shirts or tying shoes.  Continues on aspirin and atorvastatin for secondary stroke prevention without reports of side effects. Blood pressure not recently monitored as previously stable.  No further concerns at this time.   UPDATE 9/19/2019CM Samuel Shaw, 59 year old male returns for follow-up with his wife with history of CVA mild cognitive impairment.  He had sleep study in the past which ruled out obstructive sleep apnea.  He continues to go to [facility] 2 times a [facility].  He continues to have weakness and uses a cane to ambulate.  He denies any falls.  He no longer gets any physical therapy.  He is able to perform his activities of daily living except dressing.  He is unable to prepare meals do any housekeeping longer, he does not drive he is not responsible for his medications and he is not able to handle money.  Wife reports that his short-term memory is still not good however on MMSE he does not miss any recall questions.  He remains on aspirin and Lipitor for secondary stroke prevention.  He has minimal bruising and no bleeding.  Appetite is reportedly good.  He denies any trouble sleeping although he does snore.  He returns for reevaluation  Interval History3/19/19 Samuel Shaw During the interval time, pt has been doing the same.  On Aricept 10 mg daily.  Currently on [facility] program twice a week.  Followed with [facility]  program for physical and mental exercises.  However, wife still complains of Samuel Shaw memory issues, forgot to turn off stove, leave the  doors open, cannot find objects that he has misplaced, short memory difficulties.  He had evaluation with [facility] program in 01/2017 showed MMSE 29/30, MOCA 21/30.  In 10/2016, LDL 70, TSH 1.52, RPR negative, B12 1029.  Due to cognitive complaint, Samuel Shaw referred back for further evaluation.  Still on aspirin Lipitor for  stroke prevention.   Samuel Shaw is an 59 y.o. male PMH of 2 TIAs 2 years ago, right carotid stenosis but no intervention, and current smoker was admitted on 07/06/14 for subacute CVA in late 06/2014 in [place] with progressive left-sided weakness since the discharge. These symptoms are similar to his stroke in December, the family just feels they are getting worse. A review of records from  [place] are pertinent for a head CT showing an acute right basal ganglia infarct and he is on ASA 325mg  daily. MRI showed multifocal subacute to remote infarct at right MCA territory (no new infarct) and CTA neck showed right ICA occlusion, athero vs. Dissection. He was continued on discharged to CIR .  Repeat CTA stable.      REVIEW OF SYSTEMS: Full 14 system review of systems performed and notable only for those listed, all others are neg:  Weakness, short term memory loss  ALLERGIES: No Known Allergies  HOME MEDICATIONS: Outpatient Medications Prior to Visit  Medication Sig Dispense Refill  . acetaminophen (TYLENOL) 325 MG tablet Take 2 tablets (650 mg total) by mouth every 6 (six) hours as needed for mild pain or headache.    Marland Kitchen aspirin EC 81 MG tablet Take 81 mg by mouth daily.    Marland Kitchen atorvastatin (LIPITOR) 20 MG tablet Take 1 tablet (20 mg total) by mouth daily at 6 PM. 30 tablet 1  . donepezil (ARICEPT) 23 MG TABS tablet Take 1 tablet (23 mg total) by mouth at bedtime. 90 tablet 3  . fexofenadine (ALLEGRA) 30 MG tablet Take 60 mg by mouth every morning.     . gabapentin (NEURONTIN) 300 MG capsule take 1 capsule BY MOUTH THREE TIMES DAILY 90 capsule 0  . traZODone (DESYREL) 50 MG tablet Take 50 mg by mouth at bedtime.  1   No facility-administered medications prior to visit.    PAST MEDICAL HISTORY: Past Medical History:  Diagnosis Date  . Allergy   . Clotting disorder (HCC)    blockage in artery lt. side neck  . Hyperlipidemia   . Hypertension   . Neuromuscular disorder ([place])    stroke  lt. side affected  . Stroke [facility])     PAST SURGICAL HISTORY: No past surgical history on file.  FAMILY HISTORY: Family History  Problem Relation Age of Onset  . Diabetes Mother   . Colon cancer Father   . Esophageal cancer Neg Hx   . Rectal cancer Neg Hx   . Stomach cancer Neg Hx     SOCIAL HISTORY: Social History   Socioeconomic History  . Marital status: Married    Spouse name: [name]  . Number of children: 3  . Years of education: 2y coll  . Highest education level: Not on file  Occupational History  . Occupation: Disabled     Comment: not working at this time  Tobacco Use  . Smoking status: Former Smoker    Packs/day: 1.00    Years: 15.00    Pack years: 15.00    Types: Cigarettes    Quit date: 06/26/2014    Years since quitting: 5.6  . Smokeless tobacco: Never Used  Substance and Sexual  Activity  . Alcohol use: No    Alcohol/week: 0.0 standard drinks  . Drug use: No    Frequency: 3.0 times per week  . Sexual activity: Not on file  Other Topics Concern  . Not on file  Social History Narrative   Caffeine.1-2 drinks a day .   Social Determinants of Health   Financial Resource Strain:   . Difficulty of Paying Living Expenses:   Food Insecurity:   . Worried About Charity fundraiser in the Last Year:   . Arboriculturist in the Last Year:   Transportation Needs:   . Film/video editor (Medical):   Marland Kitchen Lack of Transportation (Non-Medical):   Physical Activity:   . Days of Exercise per Week:   . Minutes of Exercise per Session:   Stress:   . Feeling of Stress :   Social Connections:   . Frequency of Communication with Friends and Family:   . Frequency of Social Gatherings with Friends and Family:   . Attends Religious Services:   . Active Member of Clubs or Organizations:   . Attends Archivist Meetings:   Marland Kitchen Marital Status:   Intimate Partner Violence:   . Fear of Current or Ex-Partner:   . Emotionally Abused:   Marland Kitchen Physically Abused:     . Sexually Abused:      Physical exam  There were no vitals filed for this visit. There is no height or weight on file to calculate BMI.  General: well developed, well nourished,  pleasant middle-aged African-American male, seated, in no evident distress Head: head normocephalic and atraumatic.   Neck: supple with no carotid or supraclavicular bruits Cardiovascular: regular rate and rhythm, no murmurs Musculoskeletal: no deformity Skin:  no rash/petichiae Vascular:  Normal pulses all extremities   Neurologic Exam Mental Status: Awake and fully alert.  Mood and affect appropriate.  MMSE - Mini Mental State Exam 08/20/2019 03/21/2018 09/18/2017  Orientation to time 3 4 4   Orientation to Place 4 5 5   Registration 3 3 3   Attention/ Calculation 1 3 3   Recall 3 3 3   Language- name 2 objects 2 2 2   Language- repeat 1 0 1  Language- follow 3 step command 3 3 3   Language- read & follow direction 1 1 1   Write a sentence 1 1 1   Copy design 0 0 0  Total score 22 25 26     Cranial Nerves: Pupils equal, briskly reactive to light. Extraocular movements full without nystagmus. Visual fields full to confrontation. Hearing intact. Facial sensation intact. Face, tongue, palate moves normally and symmetrically.  Motor: RUE and RLE 5/5, LUE 4/5 with right shoulder weakness, and LLE 4/5 with footdropt; decreased left hand finger dexterity; orbits right arm over left hand  Sensory.: intact to touch , pinprick , position and vibratory sensation.  Coordination: Rapid alternating movements normal in all extremities. Finger-to-nose and heel-to-shin performed accurately bilaterally. Gait and Station: Arises from chair without difficulty. Stance is normal. Gait demonstrates left hemiplegic gait with use of cane Reflexes: 1+ and symmetric. Toes downgoing.        ASSESSMENT AND PLAN 59 y.o. African American male with PMH of TIA, right carotid stenosis without intervention, smoker was admitted due to  right MCA territory stroke. Found to have right ICA occlusion versus questionable dissection.  Will get a repeat CTA head and neck to assess for new stroke and prior history of ICA stenosis. Stable mild left sided hemiparesis. Mild cognitive impairment  likely due to stroke, however has worsened since June over time despite ongoing use of Aricept 10 mg daily. MMSE 22 (prior 25)   Plan:   -Continue ASA and lipitor for stroke prevention -Will recheck lipid panel today but advised ongoing monitoring by PCP -Repeat CTA head/neck for surveillance monitoring of known R ICA stenosis - order placed at prior visit and provided wife with [place] imaging number to call for appt -Stay well hydrated and avoid dehydration  -Continue to use a cane for safe ambulation -Increased aricept to 23 mg daily for cognitive impairment  -Continue  PACE program -Follow up with your primary care physician for stroke risk factor modification. Recommend maintain blood pressure goal 120-140, diabetes with hemoglobin A1c goal below 6.5% and lipids with LDL cholesterol goal below 70 mg/dL.     Follow-up in 6 months or call earlier if needed   I spent *** minutes of face-to-face and non-face-to-face time with Samuel Shaw.  This included previsit chart review, lab review, study review, order entry, electronic health record documentation, Samuel Shaw education    Frann Rider, [facility]  [facility] [address] [place], [place] [address]  Phone [phone] Fax [phone] Note: This document was prepared with digital dictation and possible smart phrase technology. Any transcriptional errors that result from this process are unintentional.

## 2020-03-22 ENCOUNTER — Encounter (HOSPITAL_COMMUNITY): Payer: Self-pay

## 2020-03-22 ENCOUNTER — Ambulatory Visit (HOSPITAL_COMMUNITY)
Admission: EM | Admit: 2020-03-22 | Discharge: 2020-03-22 | Disposition: A | Discharge status: Home / Self Care | Payer: Medicare (Managed Care) | Attending: Physician Assistant | Admitting: Physician Assistant

## 2020-03-22 ENCOUNTER — Other Ambulatory Visit: Payer: Self-pay

## 2020-03-22 DIAGNOSIS — R05 Cough: Secondary | ICD-10-CM | POA: Insufficient documentation

## 2020-03-22 DIAGNOSIS — I69319 Unspecified symptoms and signs involving cognitive functions following cerebral infarction: Secondary | ICD-10-CM | POA: Diagnosis not present

## 2020-03-22 DIAGNOSIS — J069 Acute upper respiratory infection, unspecified: Secondary | ICD-10-CM | POA: Diagnosis not present

## 2020-03-22 DIAGNOSIS — E785 Hyperlipidemia, unspecified: Secondary | ICD-10-CM | POA: Diagnosis not present

## 2020-03-22 DIAGNOSIS — I69354 Hemiplegia and hemiparesis following cerebral infarction affecting left non-dominant side: Secondary | ICD-10-CM | POA: Diagnosis not present

## 2020-03-22 DIAGNOSIS — Z79899 Other long term (current) drug therapy: Secondary | ICD-10-CM | POA: Insufficient documentation

## 2020-03-22 DIAGNOSIS — G4733 Obstructive sleep apnea (adult) (pediatric): Secondary | ICD-10-CM | POA: Insufficient documentation

## 2020-03-22 DIAGNOSIS — R001 Bradycardia, unspecified: Secondary | ICD-10-CM | POA: Insufficient documentation

## 2020-03-22 DIAGNOSIS — Z20822 Contact with and (suspected) exposure to covid-19: Secondary | ICD-10-CM | POA: Diagnosis not present

## 2020-03-22 DIAGNOSIS — Z87891 Personal history of nicotine dependence: Secondary | ICD-10-CM | POA: Diagnosis not present

## 2020-03-22 DIAGNOSIS — I10 Essential (primary) hypertension: Secondary | ICD-10-CM | POA: Diagnosis not present

## 2020-03-22 DIAGNOSIS — Z7982 Long term (current) use of aspirin: Secondary | ICD-10-CM | POA: Diagnosis not present

## 2020-03-22 MED ORDER — FLUTICASONE PROPIONATE 50 MCG/ACT NA SUSP: 1.0000 | Freq: Every day | NASAL | 2 refills | Status: AC

## 2020-03-22 MED ORDER — BENZONATATE 100 MG PO CAPS: 100.0000 mg | ORAL_CAPSULE | Freq: Three times a day (TID) | ORAL | 0 refills | Status: DC

## 2020-03-22 NOTE — Discharge Instructions (Signed)
Take the medicine as prescribed  He developed shortness of breath, chest pain or other concerning symptoms go to the emergency department  Follow-up with your primary care discussed your heart rate, we do not see any concerning on EKG today  If your Covid-19 test is positive, you will receive a phone call from [facility] regarding your results. Negative test results are not called. Both positive and negative results area always visible on MyChart. If you do not have a MyChart account, sign up instructions are in your discharge papers.   Persons who are directed to care for themselves at home may discontinue isolation under the following conditions:   At least 10 days have passed since symptom onset and  At least 24 hours have passed without running a fever (this means without the use of fever-reducing medications) and  Other symptoms have improved.  Persons infected with COVID-19 who never develop symptoms may discontinue isolation and other precautions 10 days after the date of their first positive COVID-19 test.

## 2020-03-22 NOTE — ED Provider Notes (Signed)
[place]    CSN: [id number] Arrival date & time: 03/22/20  [place]      History   Chief Complaint Chief Complaint  Patient presents with  . Cough    HPI Samuel Shaw is a 59 y.o. male.   Patient reports for evaluation of cough.  Reports cough is been present for the last 1 week.  Reports occasional mucus but otherwise dry.  Reports some nasal congestion.  He reports he had some right-sided chest tightness earlier today that lasted a short time.  This went away and is not experiencing any chest tightness now.  Does not describe this as pain.  Denies shortness of breath.  Denies any left-sided chest discomfort or radiation to the left arm or jaw.  Denies dizziness, lightheadedness.  Denies any sore throat.  Denies nausea, vomiting or diarrhea.  States he otherwise feels well.  States his heart rate always runs a little low and he is always been told this.     Past Medical History:  Diagnosis Date  . Allergy   . Clotting disorder (HCC)    blockage in artery lt. side neck  . Hyperlipidemia   . Hypertension   . Neuromuscular disorder ([place])    stroke lt. side affected  . Stroke [facility])     Patient Active Problem List   Diagnosis Date Noted  . Neuropathic pain of left shoulder 07/25/2016  . Hemiparesis affecting left side as late effect of stroke ([place]) 01/10/2016  . Hypersomnia, unspecified 01/10/2016  . Cephalalgia 01/10/2016  . OSA (obstructive sleep apnea) 11/10/2015  . Residual cognitive deficit as late effect of stroke 11/05/2015  . Cerebral infarction due to occlusion of right carotid artery ([place]) 08/11/2015  . Adhesive capsulitis of right shoulder 06/07/2015  . Adhesive capsulitis of left shoulder 05/13/2015  . 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 Cerebral infarction due to embolism of right carotid artery ([place]) 02/09/2015  . Carotid artery occlusion with infarction ([place]) 10/08/2014  . Essential hypertension 10/08/2014  . Hemi-neglect of left side  07/16/2014  . Spastic hemiplegia affecting nondominant side ([place]) 07/10/2014  . Cognitive deficit, post-stroke 07/10/2014  . Sinus bradycardia on ECG 07/10/2014  . Acute ischemic right middle cerebral artery (MCA) stroke ([place]) 07/08/2014  . CVA (cerebral vascular accident) ([place])   . Tobacco abuse   . Hyperlipidemia   . Cerebral infarction ([place]) 07/06/2014    History reviewed. No pertinent surgical history.     Home Medications    Prior to Admission medications   Medication Sig Start Date End Date Taking? Authorizing Provider  acetaminophen (TYLENOL) 325 MG tablet Take 2 tablets (650 mg total) by mouth every 6 (six) hours as needed for mild pain or headache. 07/08/14   Elgergawy, Silver Huguenin, MD  aspirin EC 81 MG tablet Take 81 mg by mouth daily.    [provider]  atorvastatin (LIPITOR) 20 MG tablet Take 1 tablet (20 mg total) by mouth daily at 6 PM. 07/29/14   Angiulli, Lavon Paganini, PA-C  benzonatate (TESSALON) 100 MG capsule Take 1 capsule (100 mg total) by mouth every 8 (eight) hours. 03/22/20   Jakeya Gherardi, Marguerita Beards, PA-C  donepezil (ARICEPT) 23 MG TABS tablet Take 1 tablet (23 mg total) by mouth at bedtime. 08/20/19   Frann Rider, NP  fexofenadine (ALLEGRA) 30 MG tablet Take 60 mg by mouth every morning.     [provider]  fluticasone (FLONASE) 50 MCG/ACT nasal spray Place 1 spray into both nostrils daily. 03/22/20  Maribelle Hopple, Marguerita Beards, PA-C  gabapentin (NEURONTIN) 300 MG capsule take 1 capsule BY MOUTH THREE TIMES DAILY 01/11/16   Kirsteins, Luanna Salk, MD  traZODone (DESYREL) 50 MG tablet Take 50 mg by mouth at bedtime. 03/04/15   [provider]    Family History Family History  Problem Relation Age of Onset  . Diabetes Mother   . Colon cancer Father   . Esophageal cancer Neg Hx   . Rectal cancer Neg Hx   . Stomach cancer Neg Hx     Social History Social History   Tobacco Use  . Smoking status: Former Smoker    Packs/day: 1.00    Years: 15.00    Pack years:  15.00    Types: Cigarettes    Quit date: 06/26/2014    Years since quitting: 5.7  . Smokeless tobacco: Never Used  Substance Use Topics  . Alcohol use: No    Alcohol/week: 0.0 standard drinks  . Drug use: No    Frequency: 3.0 times per week     Allergies   Patient has no known allergies.   Review of Systems Review of Systems   Physical Exam Triage Vital Signs ED Triage Vitals  Enc Vitals Group     BP 03/22/20 1851 126/77     Pulse Rate 03/22/20 1851 (!) 41     Resp 03/22/20 1851 16     Temp 03/22/20 1851 98.4 F (36.9 C)     Temp Source 03/22/20 1851 Oral     SpO2 03/22/20 1851 98 %     Weight 03/22/20 1852 200 lb (90.7 kg)     Height 03/22/20 1852 6\' 2"  (1.88 m)     Head Circumference --      Peak Flow --      Pain Score 03/22/20 1852 0     Pain Loc --      Pain Edu? --      Excl. in [place]? --    No data found.  Updated Vital Signs BP 126/77   Pulse (!) 41   Temp 98.4 F (36.9 C) (Oral)   Resp 16   Ht 6\' 2"  (1.88 m)   Wt 200 lb (90.7 kg)   SpO2 98%   BMI 25.68 kg/m   Visual Acuity Right Eye Distance:   Left Eye Distance:   Bilateral Distance:    Right Eye Near:   Left Eye Near:    Bilateral Near:     Physical Exam Vitals and nursing note reviewed.  Constitutional:      Appearance: He is well-developed.  HENT:     Head: Normocephalic and atraumatic.     Nose: Congestion present.     Mouth/Throat:     Mouth: Mucous membranes are moist.     Comments: Postnasal drip visible Eyes:     Conjunctiva/sclera: Conjunctivae normal.  Cardiovascular:     Rate and Rhythm: Regular rhythm. Bradycardia present.     Heart sounds: No murmur heard.  No friction rub. No gallop.   Pulmonary:     Effort: Pulmonary effort is normal. No respiratory distress.     Breath sounds: Normal breath sounds. No wheezing, rhonchi or rales.     Comments: Moving air well.  Saturating 98% on room air Abdominal:     Palpations: Abdomen is soft.     Tenderness: There is no  abdominal tenderness.  Musculoskeletal:     Cervical back: Neck supple.     Right lower leg: No edema.  Left lower leg: No edema.  Skin:    General: Skin is warm and dry.  Neurological:     Mental Status: He is alert and oriented to person, place, and time.      UC Treatments / Results  Labs (all labs ordered are listed, but only abnormal results are displayed) Labs Reviewed  SARS CORONAVIRUS 2 (TAT 6-24 HRS)    EKG Sinus bradycardia.  Unchanged with exception of rate from previous.  Abnormal but likely baseline EKG for this patient.  Radiology No results found.  Procedures Procedures (including critical care time)  Medications Ordered in UC Medications - No data to display  Initial Impression / Assessment and Plan / UC Course  I have reviewed the triage vital signs and the nursing notes.  Pertinent labs & imaging results that were available during my care of the patient were reviewed by me and considered in my medical decision making (see chart for details).     #Viral URI with cough Patient is a 59 year old presenting with viral upper respiratory symptoms with cough.  EKG performed due to bradycardia, seems baseline for this patient.  He is asymptomatic otherwise.  Otherwise normal vital signs.  Lung exam benign.  Discharged with symptomatic management with Tessalon and Flonase.  Discussed return, follow-up and emergency room precautions.  Covid sent.  Patient verbalized agreement and understanding plan of care Final Clinical Impressions(s) / UC Diagnoses   Final diagnoses:  Viral URI with cough     Discharge Instructions     Take the medicine as prescribed  He developed shortness of breath, chest pain or other concerning symptoms go to the emergency department  Follow-up with your primary care discussed your heart rate, we do not see any concerning on EKG today  If your Covid-19 test is positive, you will receive a phone call from [facility] regarding  your results. Negative test results are not called. Both positive and negative results area always visible on MyChart. If you do not have a MyChart account, sign up instructions are in your discharge papers.   Persons who are directed to care for themselves at home may discontinue isolation under the following conditions:  . At least 10 days have passed since symptom onset and . At least 24 hours have passed without running a fever (this means without the use of fever-reducing medications) and . Other symptoms have improved.  Persons infected with COVID-19 who never develop symptoms may discontinue isolation and other precautions 10 days after the date of their first positive COVID-19 test.       ED Prescriptions    Medication Sig Dispense Auth. Provider   benzonatate (TESSALON) 100 MG capsule Take 1 capsule (100 mg total) by mouth every 8 (eight) hours. 21 capsule Harvel Meskill, Marguerita Beards, PA-C   fluticasone (FLONASE) 50 MCG/ACT nasal spray Place 1 spray into both nostrils daily. 15.8 mL Jordi Kamm, Marguerita Beards, PA-C     PDMP not reviewed this encounter.   Purnell Shoemaker, PA-C 03/22/20 2121

## 2020-03-22 NOTE — ED Triage Notes (Signed)
Pt c/o non productive cough and tight chestx1wk. Pt denies any other sx.

## 2020-03-23 LAB — SARS CORONAVIRUS 2 (TAT 6-24 HRS): SARS Coronavirus 2: NEGATIVE

## 2021-01-29 ENCOUNTER — Emergency Department (HOSPITAL_COMMUNITY)
Admission: EM | Admit: 2021-01-29 | Discharge: 2021-01-29 | Disposition: A | Discharge status: Home / Self Care | Payer: Medicare (Managed Care) | Attending: Emergency Medicine | Admitting: Emergency Medicine

## 2021-01-29 ENCOUNTER — Encounter (HOSPITAL_COMMUNITY): Payer: Self-pay | Admitting: Emergency Medicine

## 2021-01-29 ENCOUNTER — Emergency Department (HOSPITAL_COMMUNITY): Payer: Medicare (Managed Care)

## 2021-01-29 DIAGNOSIS — F17200 Nicotine dependence, unspecified, uncomplicated: Secondary | ICD-10-CM | POA: Insufficient documentation

## 2021-01-29 DIAGNOSIS — F10129 Alcohol abuse with intoxication, unspecified: Secondary | ICD-10-CM | POA: Diagnosis not present

## 2021-01-29 DIAGNOSIS — R55 Syncope and collapse: Secondary | ICD-10-CM | POA: Diagnosis not present

## 2021-01-29 LAB — CBC WITH DIFFERENTIAL/PLATELET
Abs Immature Granulocytes: 0.03 10*3/uL (ref 0.00–0.07)
Basophils Absolute: 0 10*3/uL (ref 0.0–0.1)
Basophils Relative: 0 %
Eosinophils Absolute: 0.1 10*3/uL (ref 0.0–0.5)
Eosinophils Relative: 1 %
HCT: 43.6 % (ref 39.0–52.0)
Hemoglobin: 15.3 g/dL (ref 13.0–17.0)
Immature Granulocytes: 0 %
Lymphocytes Relative: 27 %
Lymphs Abs: 2.2 10*3/uL (ref 0.7–4.0)
MCH: 32.1 pg (ref 26.0–34.0)
MCHC: 35.1 g/dL (ref 30.0–36.0)
MCV: 91.4 fL (ref 80.0–100.0)
Monocytes Absolute: 0.8 10*3/uL (ref 0.1–1.0)
Monocytes Relative: 10 %
Neutro Abs: 5.1 10*3/uL (ref 1.7–7.7)
Neutrophils Relative %: 62 %
Platelets: 152 10*3/uL (ref 150–400)
RBC: 4.77 MIL/uL (ref 4.22–5.81)
RDW: 13.2 % (ref 11.5–15.5)
WBC: 8.2 10*3/uL (ref 4.0–10.5)
nRBC: 0 % (ref 0.0–0.2)

## 2021-01-29 LAB — COMPREHENSIVE METABOLIC PANEL
ALT: 16 U/L (ref 0–44)
AST: 15 U/L (ref 15–41)
Albumin: 4 g/dL (ref 3.5–5.0)
Alkaline Phosphatase: 41 U/L (ref 38–126)
Anion gap: 9 (ref 5–15)
BUN: 7 mg/dL (ref 6–20)
CO2: 25 mmol/L (ref 22–32)
Calcium: 9.1 mg/dL (ref 8.9–10.3)
Chloride: 103 mmol/L (ref 98–111)
Creatinine, Ser: 1.17 mg/dL (ref 0.61–1.24)
GFR, Estimated: >60 mL/min (ref >60)
Glucose, Bld: 98 mg/dL (ref 70–99)
Potassium: 4.1 mmol/L (ref 3.5–5.1)
Sodium: 137 mmol/L (ref 135–145)
Total Bilirubin: 1.6 mg/dL — ABNORMAL HIGH (ref 0.3–1.2)
Total Protein: 6.5 g/dL (ref 6.5–8.1)

## 2021-01-29 LAB — MAGNESIUM: Magnesium: 1.9 mg/dL (ref 1.7–2.4)

## 2021-01-29 MED ORDER — DICLOFENAC SODIUM 1 % EX GEL: 4.0000 g | Freq: Four times a day (QID) | CUTANEOUS | 0 refills | Status: DC

## 2021-01-29 NOTE — ED Triage Notes (Signed)
Pt to triage via [facility] from a cookout.  Reports syncopal episode.  No fall.  C/o headache that started today and lower back pain for a few days.  BP 78/40.  20g LAC.  NS 548m bolus.  CBG 91.  BP 100/68 after bolus.  HR 48.

## 2021-01-29 NOTE — ED Notes (Signed)
20G IV taken out of Left AC

## 2021-01-29 NOTE — Discharge Instructions (Addendum)
Eat and drink as well as you can for the next 48 hours.  Please call your family doctor and let them know what happened.  They may want to arrange for you a different type of physical therapy if needed to focus on your back.  Your back pain is most likely due to a muscular strain.  There is been a lot of research on back pain, unfortunately the only thing that seems to really help is Tylenol and ibuprofen.  Relative rest is also important to not lift greater than 10 pounds bending or twisting at the waist.  Please follow-up with your family physician.  The other thing that really seems to benefit patients is physical therapy which your doctor may send you for.  Please return to the emergency department for new numbness or weakness to your arms or legs. Difficulty with urinating or urinating or pooping on yourself.  Also if you cannot feel toilet paper when you wipe or get a fever.   Use the gel as prescribed Also take up to tylenol '1000mg'$ (2 extra strength) four times a day.

## 2021-01-29 NOTE — ED Provider Notes (Signed)
Emergency Medicine Provider Triage Evaluation Note  Samuel Shaw , a 60 y.o. male  was evaluated in triage.  Pt with multiple complaints.  He presented from a cookout via EMS after a syncopal episode.  There was no fall.  He is also complaining of a headache earlier today, denies headache now.  He has had low back pain across his back and in right hip x3 days.  Patient was hypotensive for EMS 78/40.  He was given 500 mL normal saline and had a sugar of 91.  BP improved after fluids.  Patient denies any known injury to cause his back pain.  No history of kidney stones.  No urinary symptoms.  Denies drinking alcohol today states he does not drink enough water usually during the day.   Review of Systems  Positive: Syncope, back pain, hip pain, headache Negative: Fever, chills, cough, chest pain, palpitations, shortness of breath  Physical Exam  BP 90/60 (BP Location: Right Arm)   Pulse (!) 45   Temp 98 F (36.7 C) (Oral)   Resp 16   SpO2 94%  Gen:   Awake, no distress   Resp:  Normal effort  MSK:   Moves extremities without difficulty  Other:  Palpation across low back and right hip. Alert and oriented x 4. Clear speech  Medical Decision Making  Medically screening exam initiated at 4:22 PM.  Appropriate orders placed.  ANVIT PASQUAL was informed that the remainder of the evaluation will be completed by another provider, this initial triage assessment does not replace that evaluation, and the importance of remaining in the ED until their evaluation is complete.  Labs, UA, EKG, chest and right hip xray ordered.   Portions of this note were generated with [employer]. Dictation errors may occur despite best attempts at proofreading.    Barrie Folk, PA-C 01/29/21 1622    Wyvonnia Dusky, MD 01/30/21 [phone]

## 2021-02-14 NOTE — ED Provider Notes (Signed)
[facility] Provider Note   CSN: [id number] Arrival date & time: 01/29/21  1540     History No chief complaint on file.   Samuel Shaw is a 60 y.o. male.  60 yo M with a cc of a syncopal event.  +etoh and not really eating or drinking other fluids.  Felt bad after standing and then ended up on the ground.  Denies injury.  Denies cp, sob, headache, neck pain.  Denies cough congestion or fever.  Denies urinary symptoms.  Denies dark stool or blood in stool.  Feels much better now.  The history is provided by the patient.  Illness Severity:  Moderate Onset quality:  Gradual Duration:  2 minutes Timing:  Constant Progression:  Worsening Chronicity:  New Associated symptoms: loss of consciousness   Associated symptoms: no abdominal pain, no chest pain, no congestion, no diarrhea, no fever, no headaches, no myalgias, no rash, no shortness of breath and no vomiting       Past Medical History:  Diagnosis Date   Allergy    Clotting disorder (HCC)    blockage in artery lt. side neck   Hyperlipidemia    Hypertension    Neuromuscular disorder ([place])    stroke lt. 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 No pertinent surgical history.     Family History  Problem Relation Age of Onset   Diabetes Mother    Colon cancer Father    Esophageal cancer Neg Hx    Rectal cancer Neg Hx    Stomach cancer Neg Hx     Social History   Tobacco Use   Smoking status: Former    Packs/day: 1.00    Years: 15.00    Pack years: 15.00    Types: Cigarettes    Quit date: 06/26/2014    Years since quitting: 6.6   Smokeless tobacco: Never  Substance Use Topics   Alcohol use: No    Alcohol/week: 0.0 standard drinks   Drug use: No    Frequency: 3.0 times per week    Home Medications Prior to Admission medications   Medication Sig Start Date End Date Taking? Authorizing Provider  diclofenac Sodium (VOLTAREN) 1 % GEL Apply 4 g topically 4 (four) times daily. 01/29/21  Yes Deno Etienne, DO  acetaminophen (TYLENOL) 325 MG tablet Take 2 tablets (650 mg total) by  mouth every 6 (six) hours as needed for mild pain or headache. 07/08/14   Elgergawy, Silver Huguenin, MD  aspirin EC 81 MG tablet Take 81 mg by mouth daily.    [provider]  atorvastatin (LIPITOR) 20 MG tablet Take 1 tablet (20 mg total) by mouth daily at 6 PM. 07/29/14   Angiulli, Lavon Paganini, PA-C  benzonatate (TESSALON) 100 MG capsule Take 1 capsule (100 mg total) by mouth every 8 (eight) hours. 03/22/20   Darr, Edison Nasuti, PA-C  donepezil (ARICEPT) 23 MG TABS tablet Take 1 tablet (23 mg total) by mouth at bedtime. 08/20/19   Frann Rider, NP  fexofenadine (ALLEGRA) 30 MG tablet Take 60 mg by mouth every morning.     [provider]  fluticasone (FLONASE) 50  MCG/ACT nasal spray Place 1 spray into both nostrils daily. 03/22/20   Darr, Edison Nasuti, PA-C  gabapentin (NEURONTIN) 300 MG capsule take 1 capsule BY MOUTH THREE TIMES DAILY 01/11/16   Kirsteins, Luanna Salk, MD  traZODone (DESYREL) 50 MG tablet Take 50 mg by mouth at bedtime. 03/04/15   [provider]    Allergies    Patient has no known allergies.  Review of Systems   Review of Systems  Constitutional:  Negative for chills and fever.  HENT:  Negative for congestion and facial swelling.   Eyes:  Negative for discharge and visual disturbance.  Respiratory:  Negative for shortness of breath.   Cardiovascular:  Negative for chest pain and palpitations.  Gastrointestinal:  Negative for abdominal pain, diarrhea and vomiting.  Musculoskeletal:  Negative for arthralgias and myalgias.  Skin:  Negative for color change and rash.  Neurological:  Positive for loss of consciousness and syncope. Negative for tremors and headaches.  Psychiatric/Behavioral:  Negative for confusion and dysphoric mood.    Physical Exam Updated Vital Signs BP 108/79   Pulse (!) 49   Temp 98.6 F (37 C) (Oral)   Resp 16   SpO2 98%   Physical Exam Vitals and nursing note reviewed.  Constitutional:      Appearance: He is well-developed.  HENT:     Head: Normocephalic and atraumatic.  Eyes:     Pupils: Pupils are equal, round, and reactive to light.  Neck:     Vascular: No JVD.  Cardiovascular:     Rate and Rhythm: Normal rate and regular rhythm.     Heart sounds: No murmur heard.   No friction rub. No gallop.  Pulmonary:     Effort: No respiratory distress.     Breath sounds: No wheezing.  Abdominal:     General: There is no distension.     Tenderness: There is no abdominal tenderness. There is no guarding or rebound.  Musculoskeletal:        General: Normal range of motion.     Cervical back: Normal range of motion and neck supple.  Skin:    Coloration: Skin is not pale.     Findings: No rash.   Neurological:     Mental Status: He is alert and oriented to person, place, and time.  Psychiatric:        Behavior: Behavior normal.    ED Results / Procedures / Treatments   Labs (all labs ordered are listed, but only abnormal results are displayed) Labs Reviewed  COMPREHENSIVE METABOLIC PANEL - Abnormal; Notable for the following components:      Result Value   Total Bilirubin 1.6 (*)    All other components within normal limits  CBC WITH DIFFERENTIAL/PLATELET  MAGNESIUM    EKG EKG Interpretation  Date/Time:  Saturday January 29 2021 16:08:35 EDT Ventricular Rate:  44 PR Interval:  154 QRS Duration: 88 QT Interval:  440 QTC Calculation: 376 R Axis:   2 Text Interpretation: Marked sinus bradycardia Abnormal ECG No significant change since last tracing Confirmed by Deno Etienne [phone]) on 01/29/2021 9:40:01 PM  Radiology No results found.  Procedures Procedures   Medications Ordered in ED Medications - No data to display  ED Course  I have reviewed the triage vital signs and the nursing notes.  Pertinent labs & imaging results that were available during my care of the patient were reviewed by me and considered in my medical decision making (see chart for details).    MDM Rules/Calculators/A&P                           60 yo M with a chief complaint of a syncopal event.  The patient was drinking alcohol heavily this morning and not really had anything to eat.  He stood up and felt lightheaded and got sweaty and then fell to the ground.  Awoke and felt much better.  Has a resting heart rate normally in the 40s.  His lab work-up here is unremarkable no significant anemia no significant electrolyte abnormality.  EKG without concerning finding.  Most likely vasovagal by history.  Patient eating and drinking here in the ED without issue.  He feels much better.  Discharge home.  :  I have discussed the diagnosis/risks/treatment options with the patient and family and believe  the pt to be eligible for discharge home to follow-up with PCP. We also discussed returning to the ED immediately if new or worsening sx occur. We discussed the sx which are most concerning (e.g., sudden worsening pain, fever, inability to tolerate by mouth) that necessitate immediate return. Medications administered to the patient during their visit and any new prescriptions provided to the patient are listed below.  Medications given during this visit Medications - No data to display   The patient appears reasonably screen and/or stabilized for discharge and I doubt any other medical condition or other [facility] requiring further screening, evaluation, or treatment in the ED at this time prior to discharge.   Final Clinical Impression(s) / ED Diagnoses Final diagnoses:  Syncope and collapse    Rx / DC Orders ED Discharge Orders          Ordered    diclofenac Sodium (VOLTAREN) 1 % GEL  4 times daily        01/29/21 2213             Deno Etienne, DO 02/14/21 1604

## 2021-11-17 ENCOUNTER — Other Ambulatory Visit: Payer: Self-pay | Admitting: *Deleted

## 2021-11-17 DIAGNOSIS — Z87891 Personal history of nicotine dependence: Secondary | ICD-10-CM

## 2021-11-17 DIAGNOSIS — Z122 Encounter for screening for malignant neoplasm of respiratory organs: Secondary | ICD-10-CM

## 2021-12-07 ENCOUNTER — Ambulatory Visit
Admission: RE | Admit: 2021-12-07 | Discharge: 2021-12-07 | Disposition: A | Discharge status: Home / Self Care | Payer: Medicare (Managed Care) | Source: Ambulatory Visit | Attending: Acute Care | Admitting: Acute Care

## 2021-12-07 ENCOUNTER — Ambulatory Visit (INDEPENDENT_AMBULATORY_CARE_PROVIDER_SITE_OTHER): Payer: Medicare (Managed Care) | Admitting: Acute Care

## 2021-12-07 ENCOUNTER — Encounter: Payer: Self-pay | Admitting: Acute Care

## 2021-12-07 DIAGNOSIS — Z87891 Personal history of nicotine dependence: Secondary | ICD-10-CM

## 2021-12-07 DIAGNOSIS — Z122 Encounter for screening for malignant neoplasm of respiratory organs: Secondary | ICD-10-CM

## 2021-12-07 NOTE — Progress Notes (Signed)
Virtual Visit via Telephone Note  I connected with Samuel Shaw on 12/06/21 at  3:30 PM EDT by telephone and verified that I am speaking with the correct person using two identifiers.  Location: Patient:  At home Provider: [place], [place], [place], [address]    I discussed the limitations, risks, security and privacy concerns of performing an evaluation and management service by telephone and the availability of in person appointments. I also discussed with the patient that there may be a patient responsible charge related to this service. The patient expressed understanding and agreed to proceed.   Shared Decision Making Visit Lung Cancer Screening Program [id number])   Eligibility: Age 61 y.o. Pack Years Smoking History Calculation 35 pack year smoking history (# packs/per year x # years smoked) Recent History of coughing up blood  no Unexplained weight loss? no ( >Than 15 pounds within the last 6 months ) Prior History Lung / other cancer no (Diagnosis within the last 5 years already requiring surveillance chest CT Scans). Smoking Status Former Smoker Former Smokers: Years since quit: 8 years   Quit Date: 2015  Visit Components: Discussion included one or more decision making aids. yes Discussion included risk/benefits of screening. yes Discussion included potential follow up diagnostic testing for abnormal scans. yes Discussion included meaning and risk of over diagnosis. yes Discussion included meaning and risk of False Positives. yes Discussion included meaning of total radiation exposure. yes  Counseling Included: Importance of adherence to annual lung cancer LDCT screening. yes Impact of comorbidities on ability to participate in the program. yes Ability and willingness to under diagnostic treatment. yes  Smoking Cessation Counseling: Current Smokers:  Discussed importance of smoking cessation. yes Information about tobacco cessation classes and  interventions provided to patient. yes Patient provided with "ticket" for LDCT Scan. yes Symptomatic Patient. no  Counseling NA Diagnosis Code: Tobacco Use Z72.0 Asymptomatic Patient yes  Counseling (Intermediate counseling: > three minutes counseling) I7782 Former Smokers:  Discussed the importance of maintaining cigarette abstinence. yes Diagnosis Code: Personal History of Nicotine Dependence. U23.536 Information about tobacco cessation classes and interventions provided to patient. Yes Patient provided with "ticket" for LDCT Scan. yes Written Order for Lung Cancer Screening with LDCT placed in Epic. Yes (CT Chest Lung Cancer Screening Low Dose W/O CM) [id number] Z12.2-Screening of respiratory organs Z87.891-Personal history of nicotine dependence  I spent 25 minutes of face to face time/virtual visit time  with  Samuel Shaw discussing the risks and benefits of lung cancer screening. We took the time to pause the power point at intervals to allow for questions to be asked and answered to ensure understanding. We discussed that he had taken the single most powerful action possible to decrease his risk of developing lung cancer when he quit smoking. I counseled him to remain smoke free, and to contact me if he ever had the desire to smoke again so that I can provide resources and tools to help support the effort to remain smoke free. We discussed the time and location of the scan, and that either  Samuel Glassman RN, Joella Prince, RN or I  or I will call / send a letter with the results within  24-72 hours of receiving them. He has the office contact information in the event he needs to speak with me,  he verbalized understanding of all of the above and had no further questions upon leaving the office.     I explained to the patient that there  has been a high incidence of coronary artery disease noted on these exams. I explained that this is a non-gated exam therefore degree or severity cannot be  determined. This patient is on statin therapy. I have asked the patient to follow-up with their PCP regarding any incidental finding of coronary artery disease and management with diet or medication as they feel is clinically indicated. The patient verbalized understanding of the above and had no further questions.     Magdalen Spatz, NP             12/07/2021

## 2021-12-07 NOTE — Patient Instructions (Signed)
Thank you for participating in the [facility]. It was our pleasure to meet you today. We will call you with the results of your scan within the next few days. Your scan will be assigned a Lung RADS category score by the physicians reading the scans.  This Lung RADS score determines follow up scanning.  See below for description of categories, and follow up screening recommendations. We will be in touch to schedule your follow up screening annually or based on recommendations of our providers. We will fax a copy of your scan results to your Primary Care Physician, or the physician who referred you to the program, to ensure they have the results. Please call the office if you have any questions or concerns regarding your scanning experience or results.  Our office number is [phone]. Please speak with Denise Phelps, RN. , or  Denise Buckner RN, They are  our Lung Cancer Screening RN.'s If They are unavailable when you call, Please leave a message on the voice mail. We will return your call at our earliest convenience.This voice mail is monitored several times a day.  Remember, if your scan is normal, we will scan you annually as long as you continue to meet the criteria for the program. (Age 55-77, Current smoker or smoker who has quit within the last 15 years). If you are a smoker, remember, quitting is the single most powerful action that you can take to decrease your risk of lung cancer and other pulmonary, breathing related problems. We know quitting is hard, and we are here to help.  Please let us know if there is anything we can do to help you meet your goal of quitting. If you are a former smoker, congratulations. We are proud of you! Remain smoke free! Remember you can refer friends or family members through the number above.  We will screen them to make sure they meet criteria for the program. Thank you for helping us take better care of you by  participating in Lung Screening.  You can receive free nicotine replacement therapy ( patches, gum or mints) by calling 1-800-QUIT NOW. Please call so we can get you on the path to becoming  a non-smoker. I know it is hard, but you can do this!  Lung RADS Categories:  Lung RADS 1: no nodules or definitely non-concerning nodules.  Recommendation is for a repeat annual scan in 12 months.  Lung RADS 2:  nodules that are non-concerning in appearance and behavior with a very low likelihood of becoming an active cancer. Recommendation is for a repeat annual scan in 12 months.  Lung RADS 3: nodules that are probably non-concerning , includes nodules with a low likelihood of becoming an active cancer.  Recommendation is for a 6-month repeat screening scan. Often noted after an upper respiratory illness. We will be in touch to make sure you have no questions, and to schedule your 6-month scan.  Lung RADS 4 A: nodules with concerning findings, recommendation is most often for a follow up scan in 3 months or additional testing based on our provider's assessment of the scan. We will be in touch to make sure you have no questions and to schedule the recommended 3 month follow up scan.  Lung RADS 4 B:  indicates findings that are concerning. We will be in touch with you to schedule additional diagnostic testing based on our provider's  assessment of the scan.  Other options for assistance in smoking cessation (   As covered by your insurance benefits)  Hypnosis for smoking cessation  [facility] [phone]  Acupuncture for smoking cessation  [facility] [phone]   

## 2021-12-12 ENCOUNTER — Other Ambulatory Visit: Payer: Self-pay

## 2021-12-12 DIAGNOSIS — Z122 Encounter for screening for malignant neoplasm of respiratory organs: Secondary | ICD-10-CM

## 2021-12-12 DIAGNOSIS — Z87891 Personal history of nicotine dependence: Secondary | ICD-10-CM

## 2021-12-19 ENCOUNTER — Other Ambulatory Visit: Payer: Self-pay | Admitting: Vascular Surgery

## 2021-12-19 DIAGNOSIS — Z122 Encounter for screening for malignant neoplasm of respiratory organs: Secondary | ICD-10-CM

## 2021-12-19 DIAGNOSIS — Z87891 Personal history of nicotine dependence: Secondary | ICD-10-CM

## 2022-03-03 ENCOUNTER — Ambulatory Visit: Payer: Medicare (Managed Care) | Admitting: Physician Assistant

## 2022-05-24 ENCOUNTER — Encounter: Payer: Self-pay | Admitting: Gastroenterology

## 2022-11-18 ENCOUNTER — Other Ambulatory Visit: Payer: Self-pay | Admitting: Acute Care

## 2022-11-18 DIAGNOSIS — Z122 Encounter for screening for malignant neoplasm of respiratory organs: Secondary | ICD-10-CM

## 2022-11-18 DIAGNOSIS — Z87891 Personal history of nicotine dependence: Secondary | ICD-10-CM

## 2022-12-13 ENCOUNTER — Other Ambulatory Visit: Payer: Medicare (Managed Care)

## 2022-12-27 ENCOUNTER — Ambulatory Visit (HOSPITAL_BASED_OUTPATIENT_CLINIC_OR_DEPARTMENT_OTHER)
Admission: RE | Admit: 2022-12-27 | Discharge: 2022-12-27 | Disposition: A | Discharge status: Home / Self Care | Payer: Medicare (Managed Care) | Source: Ambulatory Visit | Attending: Acute Care | Admitting: Acute Care

## 2022-12-27 DIAGNOSIS — Z122 Encounter for screening for malignant neoplasm of respiratory organs: Secondary | ICD-10-CM | POA: Diagnosis present

## 2022-12-27 DIAGNOSIS — Z87891 Personal history of nicotine dependence: Secondary | ICD-10-CM | POA: Diagnosis present

## 2023-01-01 ENCOUNTER — Other Ambulatory Visit: Payer: Self-pay | Admitting: Acute Care

## 2023-01-01 DIAGNOSIS — Z87891 Personal history of nicotine dependence: Secondary | ICD-10-CM

## 2023-01-01 DIAGNOSIS — Z122 Encounter for screening for malignant neoplasm of respiratory organs: Secondary | ICD-10-CM

## 2023-08-01 ENCOUNTER — Encounter: Payer: Self-pay | Admitting: Nurse Practitioner

## 2023-08-01 ENCOUNTER — Telehealth: Payer: Self-pay | Admitting: Nurse Practitioner

## 2023-08-01 ENCOUNTER — Ambulatory Visit (INDEPENDENT_AMBULATORY_CARE_PROVIDER_SITE_OTHER): Payer: Medicare (Managed Care) | Admitting: Nurse Practitioner

## 2023-08-01 VITALS — BP 134/80 | HR 60 | Ht 74.0 in | Wt 145.5 lb

## 2023-08-01 DIAGNOSIS — Z800 Family history of malignant neoplasm of digestive organs: Secondary | ICD-10-CM | POA: Diagnosis not present

## 2023-08-01 DIAGNOSIS — Z860101 Personal history of adenomatous and serrated colon polyps: Secondary | ICD-10-CM

## 2023-08-01 DIAGNOSIS — I10 Essential (primary) hypertension: Secondary | ICD-10-CM | POA: Diagnosis not present

## 2023-08-01 DIAGNOSIS — Z860100 Personal history of colon polyps, unspecified: Secondary | ICD-10-CM

## 2023-08-01 MED ORDER — SUTAB 1479-225-188 MG PO TABS: ORAL_TABLET | ORAL | 0 refills | Status: DC

## 2023-08-01 NOTE — Patient Instructions (Signed)
You have been scheduled for a colonoscopy. Please follow written instructions given to you at your visit today.   Please pick up your prep supplies at the pharmacy within the next 1-3 days.  If you use inhalers (even only as needed), please bring them with you on the day of your procedure.  DO NOT TAKE 7 DAYS PRIOR TO TEST- Trulicity (dulaglutide) Ozempic, Wegovy (semaglutide) Mounjaro (tirzepatide) Bydureon Bcise (exanatide extended release)  DO NOT TAKE 1 DAY PRIOR TO YOUR TEST Rybelsus (semaglutide) Adlyxin (lixisenatide) Victoza (liraglutide) Byetta (exanatide) ___________________________________________________________________________ [name] will receive your bowel preparation through [facility], which ensures the lowest copay and home delivery, with outreach via text or call from an [phone] number. Please respond promptly to avoid rescheduling. If you are interested in alternative options or have any questions please contact them at [phone]  Your Provider Has Sent Your Bowel Prep Regimen To [facility] What to expect. [facility] will contact you to verify your information and collect your copay, if applicable. Enjoy the comfort of your home while we deliver your prescription to you, free of any shipping charges. Fast, FREE delivery or shipping. [facility] accepts all major insurance benefits and applies discounts & coupons  Have additional questions? [facility]'s patient care team is always here to help.  Chat: [email or web address] Call: [phone] Email: [email or web address] NCPDP: [id number] How will we contact you? Welcome Phone call  a Welcome text and a Checkout link in a text Texts you receive from [phone] Are Not Spam.   *To set up delivery, you must complete the checkout process via link or speak to one of our patient care representatives. If we are unable to reach you, your prescription may be delayed.  To avoid waiting on hold if you call. Utilize  the secure chat feature and request [facility] call you to complete the transaction or expedite your concerns.  _______________________________________________________  If your blood pressure at your visit was 140/90 or greater, please contact your primary care physician to follow up on this.  _______________________________________________________  If you are age 31 or older, your body mass index should be between 23-30. Your Body mass index is 18.68 kg/m. If this is out of the aforementioned range listed, please consider follow up with your Primary Care Provider.  If you are age 74 or younger, your body mass index should be between 19-25. Your Body mass index is 18.68 kg/m. If this is out of the aformentioned range listed, please consider follow up with your Primary Care Provider.   ________________________________________________________  [facility] providers would like to encourage you to use [facility] to communicate with providers for non-urgent requests or questions.  Due to long hold times on the telephone, sending your provider a message by [facility] may be a faster and more efficient way to get a response.  Please allow 48 business hours for a response.  Please remember that this is for non-urgent requests.  _______________________________________________________

## 2023-08-01 NOTE — Progress Notes (Signed)
Brief Narrative 63 y.o. yo male , known to Dr. Lavon Paganini with a past medical history not limited to adenomatous colon polyps, OSA, hypertension, CVA, tobacco abuse  ASSESSMENT    Family history of colon cancer in first-degree relative greater than 47 years of age ( Father had CRC in his 53's)  History of adenomatous colon polyps.   Small tubular adenoma removed in 2018. He was mailed a 5 year colonoscopy recall letter in Nov 2023  See [facility] for any additional medical history   PLAN   --Schedule for a surveillance colonoscopy. The risks and benefits of colonoscopy with possible polypectomy / biopsies were discussed and the patient agrees to proceed.      HPI   Chief complaint : History of colon polyps  Patient is here with his wife to get scheduled for surveillance colonoscopy.  He has no complaints.  Specifically has not had any blood in his stool.  No abdominal pain.  He looks thin but says his weight fluctuates without any ongoing losses.  No changes /updates in family medical history of colon cancers.   Samuel Shaw has a history of a stroke . He is not anticoagulated but does take a daily baby aspirin  GI History / Studies   **May not be a complete list of studies  November 2018 screening colonoscopy - One 4 mm polyp in the transverse colon, removed with a cold snare. Resected and retrieved. - Mild diverticulosis in the sigmoid colon, in the descending colon, in the transverse colon and at the hepatic flexure. There was no evidence of diverticular bleeding. - Non-bleeding internal hemorrhoids. - The examination was otherwise normal.  Path -tubular adenoma   Past Medical History:  Diagnosis Date   Allergy    Clotting disorder (HCC)    blockage in artery lt. side neck   Hyperlipidemia    Hypertension    Neuromuscular disorder (HCC)    stroke lt. side affected   Stroke (HCC)    No past surgical history on file. Family History  Problem Relation Age of Onset   Diabetes  Mother    Colon cancer Father    Esophageal cancer Neg Hx    Rectal cancer Neg Hx    Stomach cancer Neg Hx    Social History   Tobacco Use   Smoking status: Former    Current packs/day: 0.00    Average packs/day: 1 pack/day for 15.0 years (15.0 ttl pk-yrs)    Types: Cigarettes    Start date: 06/27/1999    Quit date: 06/26/2014    Years since quitting: 9.1   Smokeless tobacco: Never  Substance Use Topics   Alcohol use: No    Alcohol/week: 0.0 standard drinks of alcohol   Drug use: No    Frequency: 3.0 times per week   Current Outpatient Medications  Medication Sig Dispense Refill   acetaminophen (TYLENOL) 325 MG tablet Take 2 tablets (650 mg total) by mouth every 6 (six) hours as needed for mild pain or headache.     aspirin EC 81 MG tablet Take 81 mg by mouth daily.     atorvastatin (LIPITOR) 20 MG tablet Take 1 tablet (20 mg total) by mouth daily at 6 PM. 30 tablet 1   donepezil (ARICEPT) 23 MG TABS tablet Take 1 tablet (23 mg total) by mouth at bedtime. 90 tablet 3   fexofenadine (ALLEGRA) 30 MG tablet Take 60 mg by mouth every morning.      fluticasone (FLONASE) 50 MCG/ACT  nasal spray Place 1 spray into both nostrils daily. 15.8 mL 2   traZODone (DESYREL) 50 MG tablet Take 50 mg by mouth at bedtime.  1   Vitamin D, Cholecalciferol, 10 MCG (400 UNIT) CAPS Take 1 capsule by mouth every 28 (twenty-eight) days.     benzonatate (TESSALON) 100 MG capsule Take 1 capsule (100 mg total) by mouth every 8 (eight) hours. (Patient not taking: Reported on 08/01/2023) 21 capsule 0   diclofenac Sodium (VOLTAREN) 1 % GEL Apply 4 g topically 4 (four) times daily. (Patient not taking: Reported on 08/01/2023) 100 g 0   gabapentin (NEURONTIN) 300 MG capsule take 1 capsule BY MOUTH THREE TIMES DAILY (Patient not taking: Reported on 08/01/2023) 90 capsule 0   No current facility-administered medications for this visit.   No Known Allergies   Review of Systems:  All systems reviewed and negative  except where noted in HPI.   Wt Readings from Last 3 Encounters:  12/27/22 150 lb (68 kg)  03/22/20 200 lb (90.7 kg)  05/09/18 194 lb (88 kg)    Physical Exam:  BP 134/80 (BP Location: Left Arm, Patient Position: Sitting, Cuff Size: Normal)   Pulse 60   Ht 6\' 2"  (1.88 m)   Wt 145 lb 8 oz (66 kg)   BMI 18.68 kg/m  Constitutional:  Pleasant, thin male in no acute distress. Psychiatric:  Normal mood and affect. Behavior is normal. EENT: Pupils normal.  Conjunctivae are normal. No scleral icterus. Neck supple.  Cardiovascular: Normal rate, regular rhythm.  Pulmonary/chest: Effort normal and breath sounds normal. No wheezing, rales or rhonchi. Abdominal: Soft, nondistended, nontender. Bowel sounds active throughout. There are no masses palpable. No hepatomegaly. Neurological: Alert and oriented to person place and time. Extremities: No edema   Willette Cluster, NP  08/01/2023, 11:22 AM

## 2023-08-01 NOTE — Telephone Encounter (Signed)
[facility] requesting patient prescription be sent to them at [phone] with Assunta Gambles or Dr Hyacinth Meeker.   Please advise.   Thank you

## 2023-08-01 NOTE — Telephone Encounter (Signed)
Jessica with [facility] needs to have prep instructions sent to them. Please fax to [phone] attn: Shanda Bumps

## 2023-08-01 NOTE — Telephone Encounter (Signed)
Faxed colonoscopy instructions to Shanda Bumps per her request

## 2023-08-02 MED ORDER — SUTAB 1479-225-188 MG PO TABS: ORAL_TABLET | ORAL | 0 refills | Status: DC

## 2023-08-02 NOTE — Telephone Encounter (Signed)
Rx for prep faxed to [facility]

## 2023-08-06 ENCOUNTER — Telehealth: Payer: Self-pay

## 2023-08-06 ENCOUNTER — Encounter: Payer: Self-pay | Admitting: Gastroenterology

## 2023-08-06 NOTE — Telephone Encounter (Signed)
Called patient- no answer, voicemail left for patient to call [facility] prior to 5 pm today or his procedure will be cancelled;  Also provided the number to the office for patient to call and advise if he is going to proceed with the procedure or if he is needing to cancel; Awaiting patient's return phone call;

## 2023-08-07 NOTE — Telephone Encounter (Signed)
 Attempted to reach patient at [phone] unable to speak with patient, nor able to leave a message as mailbox is full;    Called [facility]- left message for Benz' RN Juris) to call back to the office as this patient's procedure would be cancelled if he has not received his prep RX;  Awaiting response from [facility];

## 2023-08-07 NOTE — Telephone Encounter (Signed)
[facility] RN, Joni Reining, called into the office to report the patient has received the prep and the instructions for prep use prior to procedure;  This will also be noted in [facility]

## 2023-08-08 ENCOUNTER — Encounter: Payer: Medicare (Managed Care) | Admitting: Gastroenterology

## 2023-08-08 ENCOUNTER — Telehealth: Payer: Self-pay | Admitting: Gastroenterology

## 2023-08-08 DIAGNOSIS — Z860100 Personal history of colon polyps, unspecified: Secondary | ICD-10-CM

## 2023-08-08 MED ORDER — NA SULFATE-K SULFATE-MG SULF 17.5-3.13-1.6 GM/177ML PO SOLN
1.0000 | Freq: Once | ORAL | 0 refills | Status: AC
Start: 2023-08-08 — End: 2023-08-08

## 2023-08-08 MED ORDER — ONDANSETRON HCL 4 MG PO TABS: 4.0000 mg | ORAL_TABLET | Freq: Two times a day (BID) | ORAL | 0 refills | Status: DC

## 2023-08-08 NOTE — Telephone Encounter (Signed)
 ok

## 2023-08-08 NOTE — Telephone Encounter (Signed)
 Attempted to call pt to discuss. No answer on patient's phone and mailbox full. Called wife's phone, no answer. Left message that MD would like to see if pt could tolerate Miralax and gatorade to finish prepping and come in for a 3:30 appointment today. Left direct number for admitting to call back.

## 2023-08-08 NOTE — Telephone Encounter (Signed)
 Patient rescheduled for 08/13/2023 requesting new prep along with instructions.

## 2023-08-08 NOTE — Telephone Encounter (Signed)
Patient wife called and stated that her husband has been vomiting up his SuTab. Patient was not able to keep down his prep medication. Patient wife is requesting a call back. Please advise.

## 2023-08-08 NOTE — Telephone Encounter (Signed)
 Attempted to contact pt to discuss new appointment, instructions and prep. No answer. Left detailed message stating that new prescription for Suprep and for Zofran  sent to [facility] pharmacy on file, and that new instructions sent to pt via MyChart. Instructed pt to review instructions and obtain prep ASAP. Instructed to call back with any questions.

## 2023-08-13 ENCOUNTER — Telehealth: Payer: Self-pay | Admitting: Gastroenterology

## 2023-08-13 ENCOUNTER — Encounter: Payer: Medicare (Managed Care) | Admitting: Gastroenterology

## 2023-08-13 NOTE — Telephone Encounter (Signed)
 Hi Dr Leonia Raman,   I called patient and left voice message regarding her appointment. Patient was a no show for today.   Thank you

## 2023-08-21 ENCOUNTER — Telehealth: Payer: Self-pay

## 2023-08-21 NOTE — Telephone Encounter (Signed)
Unable to reach patient for PV apt. 2 VM left. Pt to call and r/s PV apt by 5PM today to avoid cancellation of his upcoming colonoscopy.

## 2023-08-30 ENCOUNTER — Ambulatory Visit (AMBULATORY_SURGERY_CENTER): Payer: Medicare (Managed Care)

## 2023-08-30 ENCOUNTER — Telehealth: Payer: Self-pay

## 2023-08-30 VITALS — Ht 74.0 in | Wt 145.0 lb

## 2023-08-30 DIAGNOSIS — Z860100 Personal history of colon polyps, unspecified: Secondary | ICD-10-CM

## 2023-08-30 DIAGNOSIS — Z800 Family history of malignant neoplasm of digestive organs: Secondary | ICD-10-CM

## 2023-08-30 NOTE — Telephone Encounter (Signed)
 Spoke with patient and his wife and completed Pre visit.  Went over prep instructions.  Called [facility] and spoke with the nurse about where to call in prescription SUPREP.  Nurse took all of the info regarding suprep and the PCP at the facility will call the prep into [facility] for mail order delivery per the nurse.

## 2023-08-30 NOTE — Progress Notes (Signed)

## 2023-08-31 ENCOUNTER — Encounter: Payer: Medicare (Managed Care) | Admitting: Gastroenterology

## 2023-09-14 ENCOUNTER — Telehealth: Payer: Self-pay | Admitting: *Deleted

## 2023-09-14 ENCOUNTER — Encounter: Payer: Medicare (Managed Care) | Admitting: Gastroenterology

## 2023-09-14 NOTE — Telephone Encounter (Addendum)
 Pt arrived here at 11am stating his instructions told him his procedure was at 12pm. Staff pulled up instructions and they stated pt was to arrive at 2pm. Per receptionist, pt and his wife waited in the lobby for a while, then the wife stated she was going to leave and come back at 2pm when he was scheduled and pt stayed in the lobby. A little while later pt was seen by receptionist leaving the 4th floor. RN attempted to call the pt and his wife's phones. Left messages on each that we were ready to check him in and asking if he planned to have procedure today. Have not heard back and they have not returned as of 2:40pm. MD made aware, she states pt will have to have an office visit prior to being scheduled for a colonoscopy again as he has had 2 no shows and now 2 cancellations as well.     Addendum 2:45pm: pt and wife have returned. RN asked pt questions to determine if he was prepped adequately. Pt had drank a 1/2 a bottle of sprite at 2pm. Then, determined that he was still having brown stools. D/t brown stools and not being able to start the procedure until 4:30pm d/t drinking, procedure was cancelled for today. Pt's wife states "pt will not reschedule as \\he  does not want to talk about it." Offered to have MD talk to the pt but they did not want to wait/pt did not want to talk to her.   Pt's wife asked RN to call [facility] and speak to doctor there to explain why procedure was not done. Will attempt to call.

## 2023-12-13 ENCOUNTER — Other Ambulatory Visit: Payer: Self-pay | Admitting: Acute Care

## 2023-12-13 DIAGNOSIS — Z87891 Personal history of nicotine dependence: Secondary | ICD-10-CM

## 2023-12-13 DIAGNOSIS — Z122 Encounter for screening for malignant neoplasm of respiratory organs: Secondary | ICD-10-CM

## 2024-01-02 ENCOUNTER — Ambulatory Visit (HOSPITAL_BASED_OUTPATIENT_CLINIC_OR_DEPARTMENT_OTHER): Payer: Medicare (Managed Care)

## 2024-02-04 ENCOUNTER — Ambulatory Visit (HOSPITAL_COMMUNITY)
Admission: RE | Admit: 2024-02-04 | Discharge: 2024-02-04 | Disposition: A | Discharge status: Home / Self Care | Payer: Medicare (Managed Care) | Source: Ambulatory Visit | Attending: Acute Care | Admitting: Acute Care

## 2024-02-04 DIAGNOSIS — Z87891 Personal history of nicotine dependence: Secondary | ICD-10-CM | POA: Diagnosis present

## 2024-02-04 DIAGNOSIS — Z122 Encounter for screening for malignant neoplasm of respiratory organs: Secondary | ICD-10-CM | POA: Insufficient documentation

## 2024-02-18 ENCOUNTER — Other Ambulatory Visit: Payer: Self-pay

## 2024-02-18 DIAGNOSIS — Z87891 Personal history of nicotine dependence: Secondary | ICD-10-CM

## 2024-02-18 DIAGNOSIS — Z122 Encounter for screening for malignant neoplasm of respiratory organs: Secondary | ICD-10-CM

## 2024-02-27 ENCOUNTER — Emergency Department (HOSPITAL_COMMUNITY)
Admission: EM | Admit: 2024-02-27 | Discharge: 2024-02-27 | Disposition: A | Discharge status: Home / Self Care | Payer: Medicare (Managed Care) | Attending: Emergency Medicine | Admitting: Emergency Medicine

## 2024-02-27 ENCOUNTER — Encounter (HOSPITAL_COMMUNITY): Payer: Self-pay

## 2024-02-27 ENCOUNTER — Emergency Department (HOSPITAL_COMMUNITY): Payer: Medicare (Managed Care)

## 2024-02-27 ENCOUNTER — Other Ambulatory Visit: Payer: Self-pay

## 2024-02-27 DIAGNOSIS — R079 Chest pain, unspecified: Secondary | ICD-10-CM | POA: Diagnosis not present

## 2024-02-27 DIAGNOSIS — R918 Other nonspecific abnormal finding of lung field: Secondary | ICD-10-CM | POA: Insufficient documentation

## 2024-02-27 DIAGNOSIS — I69354 Hemiplegia and hemiparesis following cerebral infarction affecting left non-dominant side: Secondary | ICD-10-CM | POA: Diagnosis not present

## 2024-02-27 DIAGNOSIS — Z7982 Long term (current) use of aspirin: Secondary | ICD-10-CM | POA: Insufficient documentation

## 2024-02-27 DIAGNOSIS — M6281 Muscle weakness (generalized): Secondary | ICD-10-CM | POA: Diagnosis not present

## 2024-02-27 DIAGNOSIS — R531 Weakness: Secondary | ICD-10-CM | POA: Insufficient documentation

## 2024-02-27 DIAGNOSIS — R001 Bradycardia, unspecified: Secondary | ICD-10-CM | POA: Diagnosis present

## 2024-02-27 DIAGNOSIS — R059 Cough, unspecified: Secondary | ICD-10-CM | POA: Diagnosis present

## 2024-02-27 DIAGNOSIS — R61 Generalized hyperhidrosis: Secondary | ICD-10-CM | POA: Diagnosis present

## 2024-02-27 DIAGNOSIS — I509 Heart failure, unspecified: Secondary | ICD-10-CM | POA: Insufficient documentation

## 2024-02-27 DIAGNOSIS — R5383 Other fatigue: Secondary | ICD-10-CM | POA: Diagnosis not present

## 2024-02-27 LAB — COMPREHENSIVE METABOLIC PANEL WITH GFR
ALT: 11 U/L (ref 0–44)
AST: 17 U/L (ref 15–41)
Albumin: 4.1 g/dL (ref 3.5–5.0)
Alkaline Phosphatase: 42 U/L (ref 38–126)
Anion gap: 8 (ref 5–15)
BUN: 5 mg/dL — ABNORMAL LOW (ref 8–23)
CO2: 25 mmol/L (ref 22–32)
Calcium: 9.2 mg/dL (ref 8.9–10.3)
Chloride: 105 mmol/L (ref 98–111)
Creatinine, Ser: 0.91 mg/dL (ref 0.61–1.24)
GFR, Estimated: >60 mL/min (ref >60)
Glucose, Bld: 125 mg/dL — ABNORMAL HIGH (ref 70–99)
Potassium: 4.1 mmol/L (ref 3.5–5.1)
Sodium: 138 mmol/L (ref 135–145)
Total Bilirubin: 1 mg/dL (ref 0.0–1.2)
Total Protein: 6.8 g/dL (ref 6.5–8.1)

## 2024-02-27 LAB — URINALYSIS, ROUTINE W REFLEX MICROSCOPIC
Bilirubin Urine: NEGATIVE
Glucose, UA: NEGATIVE mg/dL
Hgb urine dipstick: NEGATIVE
Ketones, ur: NEGATIVE mg/dL
Leukocytes,Ua: NEGATIVE
Nitrite: NEGATIVE
Protein, ur: NEGATIVE mg/dL
Specific Gravity, Urine: 1.009 (ref 1.005–1.030)
pH: 5 (ref 5.0–8.0)

## 2024-02-27 LAB — CBC WITH DIFFERENTIAL/PLATELET
Abs Immature Granulocytes: 0.02 K/uL (ref 0.00–0.07)
Basophils Absolute: 0 K/uL (ref 0.0–0.1)
Basophils Relative: 0 %
Eosinophils Absolute: 0.1 K/uL (ref 0.0–0.5)
Eosinophils Relative: 1 %
HCT: 45.5 % (ref 39.0–52.0)
Hemoglobin: 16 g/dL (ref 13.0–17.0)
Immature Granulocytes: 0 %
Lymphocytes Relative: 29 %
Lymphs Abs: 1.7 K/uL (ref 0.7–4.0)
MCH: 32.7 pg (ref 26.0–34.0)
MCHC: 35.2 g/dL (ref 30.0–36.0)
MCV: 93 fL (ref 80.0–100.0)
Monocytes Absolute: 0.4 K/uL (ref 0.1–1.0)
Monocytes Relative: 6 %
Neutro Abs: 3.6 K/uL (ref 1.7–7.7)
Neutrophils Relative %: 64 %
Platelets: 172 K/uL (ref 150–400)
RBC: 4.89 MIL/uL (ref 4.22–5.81)
RDW: 12.9 % (ref 11.5–15.5)
WBC: 5.8 K/uL (ref 4.0–10.5)
nRBC: 0 % (ref 0.0–0.2)

## 2024-02-27 LAB — RESP PANEL BY RT-PCR (RSV, FLU A&B, COVID)  RVPGX2
Influenza A by PCR: NEGATIVE
Influenza B by PCR: NEGATIVE
Resp Syncytial Virus by PCR: NEGATIVE
SARS Coronavirus 2 by RT PCR: NEGATIVE

## 2024-02-27 LAB — TROPONIN I (HIGH SENSITIVITY)
Troponin I (High Sensitivity): 3 ng/L (ref <18)
Troponin I (High Sensitivity): 3 ng/L (ref <18)

## 2024-02-27 LAB — I-STAT CG4 LACTIC ACID, ED
Lactic Acid, Venous: 1.5 mmol/L (ref 0.5–1.9)
Lactic Acid, Venous: 1.3 mmol/L (ref 0.5–1.9)

## 2024-02-27 LAB — CBG MONITORING, ED: Glucose-Capillary: 121 mg/dL — ABNORMAL HIGH (ref 70–99)

## 2024-02-27 NOTE — ED Triage Notes (Signed)
 Pt reports a cough all summer but began having weakness and sweating today. Denies any pain. Family states he was complaining of CP yesterday but pt denies.

## 2024-02-27 NOTE — ED Provider Notes (Signed)
 [place] [facility] DE[facility]MENT AT [facility] Provider Note   CSN: [id number] Arrival date & time: 02/27/24  1151     Patient presents with: Weakness and Chest Pain   Samuel Shaw is a 63 y.o. male.   Patient to ED with complaint of generalized weakness and fatigue. No fever, congestion, cough or sOB. He denies pain, nausea, vomiting. Symptoms have been ongoing but seem worse this morning after getting out of bed, causing him to return to bed for additional rest. History of CVA, mini-strokes but denies similar symptoms to those episodes. No recent change to his medications. He admits to poor compliance with medications.   The history is provided by the patient and the spouse. No language interpreter was used.  Weakness Associated symptoms: chest pain   Chest Pain Associated symptoms: weakness        Prior to Admission medications   Medication Sig Start Date End Date Taking? Authorizing Provider  acetaminophen  (TYLENOL ) 325 MG tablet Take 2 tablets (650 mg total) by mouth every 6 (six) hours as needed for mild pain or headache. 07/08/14   Elgergawy, Brayton RAMAN, MD  aspirin  EC 81 MG tablet Take 81 mg by mouth daily.    [provider]  atorvastatin  (LIPITOR) 20 MG tablet Take 1 tablet (20 mg total) by mouth daily at 6 PM. 07/29/14   Angiulli, Toribio PARAS, PA-C  donepezil  (ARICEPT ) 23 MG TABS tablet Take 1 tablet (23 mg total) by mouth at bedtime. 08/20/19   Whitfield Raisin, NP  fexofenadine (ALLEGRA) 30 MG tablet Take 60 mg by mouth every morning.     [provider]  fluticasone  (FLONASE ) 50 MCG/ACT nasal spray Place 1 spray into both nostrils daily. 03/22/20   Darr, Jacob, PA-C  gabapentin  (NEURONTIN ) 300 MG capsule take 1 capsule BY MOUTH THREE TIMES DAILY Patient not taking: Reported on 08/01/2023 01/11/16   Carilyn Prentice BRAVO, MD  traZODone  (DESYREL ) 50 MG tablet Take 50 mg by mouth at bedtime. 03/04/15   [provider]  Vitamin D, Cholecalciferol,  10 MCG (400 UNIT) CAPS Take 1 capsule by mouth every 28 (twenty-eight) days.    [provider]    Allergies: Patient has no known allergies.    Review of Systems  Cardiovascular:  Positive for chest pain.  Neurological:  Positive for weakness.    Updated Vital Signs BP 114/65   Pulse (!) 39   Temp 97.6 F (36.4 C)   Resp 17   Ht 6' 2 (1.88 m)   Wt 65.8 kg   SpO2 99%   BMI 18.62 kg/m   Physical Exam Constitutional:      Appearance: He is well-developed.  HENT:     Head: Normocephalic.  Cardiovascular:     Rate and Rhythm: Regular rhythm. Bradycardia present.     Heart sounds: No murmur heard. Pulmonary:     Effort: Pulmonary effort is normal.     Breath sounds: Normal breath sounds. No wheezing, rhonchi or rales.  Abdominal:     General: Bowel sounds are normal.     Palpations: Abdomen is soft.     Tenderness: There is no abdominal tenderness. There is no guarding or rebound.  Musculoskeletal:        General: Normal range of motion.     Cervical back: Normal range of motion and neck supple.     Right lower leg: No edema.     Left lower leg: No edema.  Skin:    General:  Skin is warm and dry.  Neurological:     General: No focal deficit present.     Mental Status: He is alert and oriented to person, place, and time.     (all labs ordered are listed, but only abnormal results are displayed) Labs Reviewed  COMPREHENSIVE METABOLIC PANEL WITH GFR - Abnormal; Notable for the following components:      Result Value   Glucose, Bld 125 (*)    BUN 5 (*)    All other components within normal limits  CBG MONITORING, ED - Abnormal; Notable for the following components:   Glucose-Capillary 121 (*)    All other components within normal limits  RESP PANEL BY RT-PCR (RSV, FLU A&B, COVID)  RVPGX2  CBC WITH DIFFERENTIAL/PLATELET  URINALYSIS, ROUTINE W REFLEX MICROSCOPIC  I-STAT CG4 LACTIC ACID, ED  I-STAT CG4 LACTIC ACID, ED  TROPONIN I (HIGH SENSITIVITY)   TROPONIN I (HIGH SENSITIVITY)    EKG: EKG Interpretation Date/Time:  Wednesday February 27 2024 13:08:40 EDT Ventricular Rate:  40 PR Interval:  161 QRS Duration:  97 QT Interval:  474 QTC Calculation: 387 R Axis:   76  Text Interpretation: Sinus bradycardia Probable left ventricular hypertrophy ST elevation, consider inferior injury Confirmed by Levander Houston [phone]) on 02/27/2024 2:06:43 PM  Radiology: DG Chest 2 View Result Date: 02/27/2024 EXAM: 2 VIEW(S) XRAY OF THE CHEST 02/27/2024 12:27:00 PM COMPARISON: Lung cancer screening CT 02/04/2024 CLINICAL HISTORY: Cough chest pain. Reason for exam: CP started today, weakness, sweats. FINDINGS: LUNGS AND PLEURA: 2 cm round opacity in right infrahilar region. This corresponds to a large calcified granuloma as noted on recent lung cancer screening CT from 02/04/24. No pulmonary edema. No pleural effusion. No pneumothorax. HEART AND MEDIASTINUM: No acute abnormality of the cardiac and mediastinal silhouettes. BONES AND SOFT TISSUES: No acute osseous abnormality. IMPRESSION: 1. No acute findings. 2. 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 Electronically signed by: Waddell Calk MD 02/27/2024 12:58 PM EDT RP Workstation: [id number]     Procedures   Medications Ordered in the ED - No data to display  Clinical Course as of 02/27/24 1538  Wed Feb 27, 2024  1409 Review of chart shows 2d ECHO 2016 showing: EF 55-60%, marked sinus bradycardia throughout the study.     [SU]  1536 Patient to ED for generalized weakness that has been progressive over the last month. No fever, pain, full or near syncope. Labs today are reassuring. No ss/sxs of recurrent CVA/TIA. He is found to be bradycardic with EKG rate of 40. History of similar rate in the past well documented. Discussed with cardiology who will arrange close follow up in office. The patient is updated and is comfortable with  discharge home.   UA pending which is to be reviewed by PA Zelaya who will address any abnormality. Otherwise, he is stable for discharge home.  [SU]    Clinical Course User Index [SU] Odell Balls, PA-C                                 Medical Decision Making Amount and/or Complexity of Data Reviewed Labs: ordered. Radiology: ordered.        Final diagnoses:  Generalized weakness  Bradycardia    ED Discharge Orders     None          Odell Balls, PA-C 02/27/24 1538  Levander Houston, MD 02/28/24 [phone]

## 2024-02-27 NOTE — ED Provider Triage Note (Signed)
 Emergency Medicine Provider Triage Evaluation Note  Samuel Shaw , a 63 y.o. male  was evaluated in triage.  Pt complains of weakness off and on for one month worse last night and this morning. Episode of chest pain as well. Hx of stroke with left-sided deficit. Denies new facial droop, slurred speech, or worsening/new unilateral weakness. Currently has no pain but states he has had a cough that is not productive. Denies known fever but does appreciate episode of sweating at home and just overall weakness.   Review of Systems  Positive: Chest pain, weakness, cough Negative: Fever, facial droop, slurred speech, worsening/new unilateral weakness  Physical Exam  BP 114/74   Pulse 69   Temp 97.6 F (36.4 C)   Resp 17   Ht 6' 2 (1.88 m)   Wt 65.8 kg   SpO2 97%   BMI 18.62 kg/m  Gen:   Awake, no distress   Resp:  Normal effort  MSK:   Known left-sided deficit on exam Other:  No obvious abnormality with auscultation of lungs   Medical Decision Making  Medically screening exam initiated at 12:49 PM.  Appropriate orders placed.  DEAUNDRA KUTZER was informed that the remainder of the evaluation will be completed by another provider, this initial triage assessment does not replace that evaluation, and the importance of remaining in the ED until their evaluation is complete.  Orders: CBC, CMP, CBG, Troponin, lactic, Resp panel, CXR, EKG   Janetta Terrall FALCON, [place] 02/27/24 1251

## 2024-02-27 NOTE — Discharge Instructions (Addendum)
 Your lab tests are reassuring and do not identify any cause for your weakness today. As we discussed, your heart rate is slow and has been in the past. This may or may not be contributing to weakness now, but needs further evaluation with cardiology. Our cardiology service will contact you with an appointment time for in-office exam.   Return to the ED with any new or concerning symptoms at any time.

## 2024-02-27 NOTE — ED Notes (Signed)
 Patient has a clotting disorder but is not on blood thinners, with hx of stroke.  Reports last night complaining of left sided chest pain and has been weak as water today with sweating and chills.  Reports he has had a cough all summer.

## 2024-03-17 ENCOUNTER — Encounter: Payer: Self-pay | Admitting: Physician Assistant

## 2024-03-17 ENCOUNTER — Ambulatory Visit: Payer: Medicare (Managed Care) | Attending: Physician Assistant | Admitting: Physician Assistant

## 2024-03-17 ENCOUNTER — Ambulatory Visit: Payer: Medicare (Managed Care)

## 2024-03-17 VITALS — BP 136/88 | HR 42 | Ht 74.0 in | Wt 159.2 lb

## 2024-03-17 DIAGNOSIS — R001 Bradycardia, unspecified: Secondary | ICD-10-CM

## 2024-03-17 DIAGNOSIS — I63231 Cerebral infarction due to unspecified occlusion or stenosis of right carotid arteries: Secondary | ICD-10-CM

## 2024-03-17 DIAGNOSIS — I10 Essential (primary) hypertension: Secondary | ICD-10-CM | POA: Diagnosis not present

## 2024-03-17 DIAGNOSIS — E785 Hyperlipidemia, unspecified: Secondary | ICD-10-CM

## 2024-03-17 NOTE — Patient Instructions (Addendum)
 Medication Instructions:  Hold Aricept  & Trazodone  for 2 weeks to see if heart rate improves.   *If you need a refill on your cardiac medications before your next appointment, please call your pharmacy*  Lab Work: TSH & T4 today  Testing/Procedures: Your physician has requested that you have an echocardiogram. Echocardiography is a painless test that uses sound waves to create images of your heart. It provides your doctor with information about the size and shape of your heart and how well your heart's chambers and valves are working. This procedure takes approximately one hour. There are no restrictions for this procedure. Please do NOT wear cologne, perfume, aftershave, or lotions (deodorant is allowed). Please arrive 15 minutes prior to your appointment time.  Please note: We ask at that you not bring children with you during ultrasound (echo/ vascular) testing. Due to room size and safety concerns, children are not allowed in the ultrasound rooms during exams. Our front office staff cannot provide observation of children in our lobby area while testing is being conducted. An adult accompanying a patient to their appointment will only be allowed in the ultrasound room at the discretion of the ultrasound technician under special circumstances. We apologize for any inconvenience.  ZIO XT- Long Term Monitor Instructions  Your physician has requested you wear a ZIO patch monitor for 3 days.  This is a single patch monitor. [employer] supplies one patch monitor per enrollment. Additional stickers are not available. Please do not apply patch if you will be having a Nuclear Stress Test,  Echocardiogram, Cardiac CT, MRI, or Chest Xray during the period you would be wearing the  monitor. The patch cannot be worn during these tests. You cannot remove and re-apply the  ZIO XT patch monitor.  Your ZIO patch monitor will be mailed 3 day USPS to your address on file. It may take 3-5 days  to receive your  monitor after you have been enrolled.  Once you have received your monitor, please review the enclosed instructions. Your monitor  has already been registered assigning a specific monitor serial # to you.  Billing and Patient Assistance Program Information  We have supplied [employer] with any of your insurance information on file for billing purposes. [employer] offers a sliding scale Patient Assistance Program for patients that do not have  insurance, or whose insurance does not completely cover the cost of the ZIO monitor.  You must apply for the Patient Assistance Program to qualify for this discounted rate.  To apply, please call [employer] at [phone], select option 4, select option 2, ask to apply for  Patient Assistance Program. Meredeth will ask your household income, and how many people  are in your household. They will quote your out-of-pocket cost based on that information.  [employer] will also be able to set up a 11-month, interest-free payment plan if needed.  Applying the monitor   Shave hair from upper left chest.  Hold abrader disc by orange tab. Rub abrader in 40 strokes over the upper left chest as  indicated in your monitor instructions.  Clean area with 4 enclosed alcohol pads. Let dry.  Apply patch as indicated in monitor instructions. Patch will be placed under collarbone on left  side of chest with arrow pointing upward.  Rub patch adhesive wings for 2 minutes. Remove white label marked 1. Remove the white  label marked 2. Rub patch adhesive wings for 2 additional minutes.  While looking in a mirror, press and release button in center  of patch. A small green light will  flash 3-4 times. This will be your only indicator that the monitor has been turned on.  Do not shower for the first 24 hours. You may shower after the first 24 hours.  Press the button if you feel a symptom. You will hear a small click. Record Date, Time and  Symptom in the Patient Logbook.  When you  are ready to remove the patch, follow instructions on the last 2 pages of Patient  Logbook. Stick patch monitor onto the last page of Patient Logbook.  Place Patient Logbook in the blue and white box. Use locking tab on box and tape box closed  securely. The blue and white box has prepaid postage on it. Please place it in the mailbox as  soon as possible. Your physician should have your test results approximately 7 days after the  monitor has been mailed back to [facility].  Call [facility] Customer Care at [phone] if you have questions regarding  your ZIO XT patch monitor. Call them immediately if you see an orange light blinking on your  monitor.  If your monitor falls off in less than 4 days, contact our Monitor department at [phone].  If your monitor becomes loose or falls off after 4 days call [employer] at [phone] for  suggestions on securing your monitor   Follow-Up: At [facility], you and your health needs are our priority.  As part of our continuing mission to provide you with exceptional heart care, our providers are all part of one team.  This team includes your primary Cardiologist (physician) and Advanced Practice Providers or APPs (Physician Assistants and Nurse Practitioners) who all work together to provide you with the care you need, when you need it.  Your next appointment:   4-6 week(s) post echo  Provider:   Scot Ford, PA-C          We recommend signing up for the patient portal called MyChart.  Sign up information is provided on this After Visit Summary.  MyChart is used to connect with patients for Virtual Visits (Telemedicine).  Patients are able to view lab/test results, encounter notes, upcoming appointments, etc.  Non-urgent messages can be sent to your provider as well.   To learn more about what you can do with MyChart, go to [email or web address].   Other Instructions Monitor Heart rate daily and document it.

## 2024-03-17 NOTE — Progress Notes (Unsigned)
 Cardiology Office Note   Date:  03/19/2024  ID:  Carlin CHRISTELLA Balloon, DOB Feb 13, 1961, MRN [id number] PCP: Cloria Annabella CROME, DO  [facility] Providers Cardiologist:  [facility] - DOD Dr. Jeffrie  History of Present Illness GORDEN STTHOMAS is a 63 y.o. male with PMH of hypertension, hyperlipidemia, history of CVA and carotid artery disease.  Transcranial Doppler in 2016 did not show any emboli.  CTA of head and neck at the time showed complete occlusion of the right internal carotid artery 1 cm above its origin, this is likely either related to dissection or focal atheromatous plaque.  There was reconstitution of the right anterior circulation intracranially via collateral flow.  There was also evidence of chronic right hemisphere MCA territory infarction.  Sleep study in June 2017 showed no obvious obstructive sleep apnea but does have fragmented sleep.  Repeat CT of the head and neck in March 2021 showed chronic right cerebral infarct, no acute abnormality, chronic right ICA occlusion with distal intracranial reconstitution, there was also evidence of emphysema.  CT of the chest obtained in April 2021 also supportive finding consistent with emphysema.  Patient has been having periodic lung cancer screening CT.  Most recent CT image obtained on 02/04/2024 was negative.  More recently, patient presented to ED on 02/27/2024 with complaint of generalized weakness and fatigue.  He also reported poor compliance with medication.  Renal function and electrolyte normal.  Hemoglobin normal, red blood cell and white blood cell count normal.  Serial troponin negative x 2.  Viral panel negative for COVID, influenza and RSV.  Urinalysis was normal.  Lactic acid also normal at 1.5.  He was found to be bradycardic with heart rate in the 40s.  He had a similar rate in the past.  The case was discussed with cardiology service who recommended follow-up in the office.  Patient is on trazodone  and Aricept .  Patient  and his wife presented today for evaluation at [facility].  Looking at his EKG, even in 2022, he was bradycardic with heart rate in the 40s.  In 2018, heart rate was in the 50s.  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  He has been very active until his stroke in 2015.  He has not been active since.  He does marijuana for years but no other illicit drug.  Talking with the patient, he says he has been having this periodic weakness and fatigue in the morning that occurs about once a month and has been going on in the recent month.  However according to his wife, he has weakness is essentially every morning and has been going on for more than 2 years.  I am not confident that he is truly symptomatic with bradycardia.  He denies any feeling of passing out.  I asked him to hold his trazodone  and the Aricept  for 2 weeks as a trial to see if his symptoms improve or heart rate improved.  He is no longer taking gabapentin .  I will obtain TSH and free T4.  I will also obtain a 3-day heart monitor to assess his heart rate to make sure he does not have any prolonged pauses or conduction delay.  I will obtain an echocardiogram prior to the next follow-up.  I plan to reassess the patient in 4 to 6 weeks after the echocardiogram has been completed.  ROS:   Patient complains of fatigue but no chest pain or shortness of breath.  He has no lower extremity edema, orthopnea or PND.  Studies Reviewed          Risk Assessment/Calculations        Physical Exam VS:  BP 136/88 (BP Location: Left Arm, Patient Position: Sitting, Cuff Size: Normal)   Pulse (!) 42   Ht 6' 2 (1.88 m)   Wt 159 lb 3.2 oz (72.2 kg)   SpO2 98%   BMI 20.44 kg/m        Wt Readings from Last 3 Encounters:  03/17/24 159 lb 3.2 oz (72.2 kg)  02/27/24 145 lb (65.8 kg)  08/30/23 145 lb (65.8 kg)    GEN: Well nourished, well developed in no acute distress NECK: No JVD; No carotid bruits CARDIAC: RRR, no  murmurs, rubs, gallops RESPIRATORY:  Clear to auscultation without rales, wheezing or rhonchi  ABDOMEN: Soft, non-tender, non-distended EXTREMITIES:  No edema; No deformity   ASSESSMENT AND PLAN  Bradycardia: Based on the previous EKG, his bradycardia dating back to at least 2022 or earlier.  I am not confident that he is weakness is related to bradycardia.  He is complaining of weakness every morning when he get up.  I will obtain TSH and a free T4.  He used to be athletes in his younger days playing basketball and football, however he appears to be quite thin and frail on exam.  I asked him to hold his trazodone  and Aricept  for 2 weeks as a trial as well to see if his heart rate improved.  History of right carotid artery occlusion and CVA: Right carotid artery is chronically occluded.  No recent recurrence  Hypertension: Blood pressure is borderline high, however I am hesitant to lowered too much given persistent bradycardia and weakness  Hyperlipidemia: On atorvastatin          Dispo: Follow-up in 4 to 6 weeks  Signed, Chasta Deshpande, [place]

## 2024-03-17 NOTE — Progress Notes (Unsigned)
 Enrolled patient for a 3 day Zio XT monitor to be mailed to patients home   [place] to read

## 2024-03-18 ENCOUNTER — Encounter: Payer: Self-pay | Admitting: Physician Assistant

## 2024-03-18 LAB — TSH: TSH: 2.38 u[IU]/mL (ref 0.450–4.500)

## 2024-03-18 LAB — T4, FREE: Free T4: 1.13 ng/dL (ref 0.82–1.77)

## 2024-03-18 NOTE — Telephone Encounter (Signed)
 Error

## 2024-03-19 ENCOUNTER — Ambulatory Visit: Payer: Self-pay | Admitting: Physician Assistant

## 2024-03-19 NOTE — Progress Notes (Signed)
 Normal thyroid function

## 2024-04-04 ENCOUNTER — Encounter: Payer: Self-pay | Admitting: Gastroenterology

## 2024-04-11 DIAGNOSIS — R001 Bradycardia, unspecified: Secondary | ICD-10-CM | POA: Diagnosis not present

## 2024-04-14 NOTE — Progress Notes (Signed)
 Spoke with patient regarding heat monitor results. No Questions or concerns right now.

## 2024-04-22 ENCOUNTER — Ambulatory Visit (HOSPITAL_COMMUNITY)
Admission: RE | Admit: 2024-04-22 | Discharge: 2024-04-22 | Disposition: A | Discharge status: Home / Self Care | Payer: Medicare (Managed Care) | Source: Ambulatory Visit | Attending: Cardiology | Admitting: Cardiology

## 2024-04-22 DIAGNOSIS — I5030 Unspecified diastolic (congestive) heart failure: Secondary | ICD-10-CM

## 2024-04-22 DIAGNOSIS — R001 Bradycardia, unspecified: Secondary | ICD-10-CM | POA: Insufficient documentation

## 2024-04-22 LAB — ECHOCARDIOGRAM COMPLETE
Area-P 1/2: 3.31 cm2
S' Lateral: 3 cm

## 2024-04-28 ENCOUNTER — Ambulatory Visit: Payer: Medicare (Managed Care) | Attending: Physician Assistant | Admitting: Physician Assistant

## 2024-04-28 ENCOUNTER — Encounter: Payer: Self-pay | Admitting: Physician Assistant

## 2024-04-28 VITALS — BP 122/82 | HR 88 | Resp 16 | Ht 74.0 in | Wt 165.0 lb

## 2024-04-28 DIAGNOSIS — Z8673 Personal history of transient ischemic attack (TIA), and cerebral infarction without residual deficits: Secondary | ICD-10-CM

## 2024-04-28 DIAGNOSIS — R001 Bradycardia, unspecified: Secondary | ICD-10-CM | POA: Diagnosis not present

## 2024-04-28 DIAGNOSIS — E785 Hyperlipidemia, unspecified: Secondary | ICD-10-CM | POA: Diagnosis not present

## 2024-04-28 NOTE — Progress Notes (Unsigned)
 Cardiology Office Note   Date:  04/28/2024  ID:  Samuel Shaw, DOB 1960-07-28, MRN [id number] PCP: Cloria Annabella CROME, DO  [facility] Providers Cardiologist:  None { Click to update primary MD,subspecialty MD or APP then REFRESH:1}    History of Present Illness Samuel Shaw is a 63 y.o. male with PMH of hypertension, hyperlipidemia, history of CVA and carotid artery disease.  Transcranial Doppler in 2016 did not show any emboli.  CTA of head and neck at the time showed complete occlusion of the right internal carotid artery 1 cm above its origin, this is likely either related to dissection or focal atheromatous plaque.  There was reconstitution of the right anterior circulation intracranially via collateral flow.  There was also evidence of chronic right hemisphere MCA territory infarction.  Sleep study in June 2017 showed no obvious obstructive sleep apnea but does have fragmented sleep.  Repeat CT of the head and neck in March 2021 showed chronic right cerebral infarct, no acute abnormality, chronic right ICA occlusion with distal intracranial reconstitution, there was also evidence of emphysema.  CT of the chest obtained in April 2021 also supportive finding consistent with emphysema.  Patient has been having periodic lung cancer screening CT.  Most recent CT image obtained on 02/04/2024 was negative.    Patient presented to ED on 02/27/2024 with complaint of generalized weakness and fatigue.  He also reported poor compliance with medication.  Renal function and electrolyte normal.  Hemoglobin normal, red blood cell and white blood cell count normal.  Serial troponin negative x 2.  Viral panel negative for COVID, influenza and RSV.  Urinalysis was normal.  Lactic acid also normal at 1.5.  He was found to be bradycardic with heart rate in the 40s.  He had a similar rate in the past.  The case was discussed with cardiology service who recommended follow-up in the office.  Patient is on  trazodone  and Aricept .   [facility] the [facility].  Looking at his EKG, even back in 2022, he was bradycardic with heart rate in the 40s.  In 2018, heart rate was in the 50s.  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  He has been very active until his stroke in 2015.  He has not been active since.  He does marijuana for years but no other illicit drug use.  He has this periodic weakness and fatigue in the morning that occurs about once a month and has been going on for at least 2 years according to his wife.  I was not sure if he was truly symptomatic with the bradycardia.  I did ask him to hold trazodone  and Aricept  for 2 weeks as a trial to see if he has something to improve.  TSH and free T4 were normal.  Echocardiogram obtained on 04/22/2024 showed EF 55 to 60%, no regional wall motion abnormality, grade 1 DD, RVSP 19 mmHg, trivial MR, no significant aortic valve issue.  Heart monitor obtained in September 2025 showed sinus rhythm with average heart rate 65 bpm, minimum heart rate 39, during sleep, maximal heart rate 115 bpm, no significant pauses or atrial fibrillation.  Patient presents today for follow-up.  I reviewed the recent echocardiogram and heart monitor about the patient.  He denies any chest pain.  His symptom has improved after holding trazodone  and Aricept .  I do not think the patient will need  pacemaker.  He can follow-up with Dr. Jeffrie in 40-month.  ROS: ***  Studies Reviewed      *** Risk Assessment/Calculations {Does this patient have ATRIAL FIBRILLATION?[phone]}         Physical Exam VS:  BP 122/82 (BP Location: Right Arm, Patient Position: Sitting, Cuff Size: Large)   Pulse 88   Resp 16   Ht 6' 2 (1.88 m)   Wt 165 lb (74.8 kg)   SpO2 98%   BMI 21.18 kg/m        Wt Readings from Last 3 Encounters:  04/28/24 165 lb (74.8 kg)  03/17/24 159 lb 3.2 oz (72.2 kg)  02/27/24 145 lb (65.8 kg)     GEN: Well nourished, well developed in no acute distress NECK: No JVD; No carotid bruits CARDIAC: ***RRR, no murmurs, rubs, gallops RESPIRATORY:  Clear to auscultation without rales, wheezing or rhonchi  ABDOMEN: Soft, non-tender, non-distended EXTREMITIES:  No edema; No deformity   ASSESSMENT AND PLAN ***    {Are you ordering a CV Procedure (e.g. stress test, cath, DCCV, TEE, etc)?   Press F2        :[id number]}  Dispo: ***  Signed, Scot Ford, PA

## 2024-04-28 NOTE — Patient Instructions (Signed)
 Medication Instructions:   Your physician recommends that you continue on your current medications as directed. Please refer to the Current Medication list given to you today.   *If you need a refill on your cardiac medications before your next appointment, please call your pharmacy*  Lab Work:  NONE ORDERED  TODAY    If you have labs (blood work) drawn today and your tests are completely normal, you will receive your results only by: MyChart Message (if you have MyChart) OR A paper copy in the mail If you have any lab test that is abnormal or we need to change your treatment, we will call you to review the results.  Testing/Procedures: NONE ORDERED  TODAY    Follow-Up: At [facility], you and your health needs are our priority.  As part of our continuing mission to provide you with exceptional heart care, our providers are all part of one team.  This team includes your primary Cardiologist (physician) and Advanced Practice Providers or APPs (Physician Assistants and Nurse Practitioners) who all work together to provide you with the care you need, when you need it.  Your next appointment:   6 month(s)  Provider:  Dr Jeffrie   We recommend signing up for the patient portal called MyChart.  Sign up information is provided on this After Visit Summary.  MyChart is used to connect with patients for Virtual Visits (Telemedicine).  Patients are able to view lab/test results, encounter notes, upcoming appointments, etc.  Non-urgent messages can be sent to your provider as well.   To learn more about what you can do with MyChart, go to [email or web address].   Other Instructions

## 2024-05-09 ENCOUNTER — Telehealth: Payer: Self-pay

## 2024-05-09 ENCOUNTER — Other Ambulatory Visit: Payer: Self-pay

## 2024-05-09 ENCOUNTER — Ambulatory Visit: Payer: Medicare (Managed Care)

## 2024-05-09 VITALS — Ht 74.0 in | Wt 164.0 lb

## 2024-05-09 DIAGNOSIS — Z800 Family history of malignant neoplasm of digestive organs: Secondary | ICD-10-CM

## 2024-05-09 DIAGNOSIS — Z860100 Personal history of colon polyps, unspecified: Secondary | ICD-10-CM

## 2024-05-09 MED ORDER — NA SULFATE-K SULFATE-MG SULF 17.5-3.13-1.6 GM/177ML PO SOLN: 1.0000 | Freq: Once | ORAL | 0 refills | Status: AC

## 2024-05-09 NOTE — Progress Notes (Signed)
 PCP MD at time of PV: [facility]  __________________________________________________________________________________________________________________________________________  No egg allergy known to patient  No soy allergy known to patient No issues known to pt with past sedation with any surgeries or procedures Patient denies ever being told they had issues or difficulty with intubation  No FH of Malignant Hyperthermia Pt is not on diet pills Pt is not on  home 02  Pt is not on blood thinners  No A fib or A flutter Have any cardiac testing pending-- no but has a follow up apt  LOA: independent  No Chew or Snuff tobacco __________________________________________________________________________________________________________________________________________  Constipation: no  Prep: suprep  __________________________________________________________________________________________________________________________________________  PV completed with patient. Prep instructions reviewed and provided during apt. Rx sent to preferred pharmacy.  __________________________________________________________________________________________________________________________________________  Patient's chart reviewed by Norleen Schillings CNRA prior to previsit and patient appropriate for the [facility].  Previsit completed and red dot placed by patient's name on their procedure day (on provider's schedule).

## 2024-05-09 NOTE — Telephone Encounter (Signed)
 Spoke with staff. Rx faxed to facility.

## 2024-05-09 NOTE — Telephone Encounter (Signed)
 [facility] contacted to see where to send patients suprep rx. Message left for Jon Ill to call the office back. Will reattempt if call is not returned by Monday.

## 2024-05-23 ENCOUNTER — Encounter: Payer: Self-pay | Admitting: Gastroenterology

## 2024-05-23 ENCOUNTER — Ambulatory Visit (AMBULATORY_SURGERY_CENTER): Payer: Medicare (Managed Care) | Admitting: Gastroenterology

## 2024-05-23 VITALS — BP 119/88 | HR 71 | Temp 97.4°F | Resp 19 | Ht 74.0 in | Wt 164.0 lb

## 2024-05-23 DIAGNOSIS — Z860101 Personal history of adenomatous and serrated colon polyps: Secondary | ICD-10-CM | POA: Diagnosis not present

## 2024-05-23 DIAGNOSIS — Z1211 Encounter for screening for malignant neoplasm of colon: Secondary | ICD-10-CM | POA: Diagnosis present

## 2024-05-23 DIAGNOSIS — D122 Benign neoplasm of ascending colon: Secondary | ICD-10-CM

## 2024-05-23 DIAGNOSIS — Z860100 Personal history of colon polyps, unspecified: Secondary | ICD-10-CM

## 2024-05-23 DIAGNOSIS — K648 Other hemorrhoids: Secondary | ICD-10-CM

## 2024-05-23 DIAGNOSIS — K644 Residual hemorrhoidal skin tags: Secondary | ICD-10-CM

## 2024-05-23 DIAGNOSIS — D125 Benign neoplasm of sigmoid colon: Secondary | ICD-10-CM

## 2024-05-23 DIAGNOSIS — D123 Benign neoplasm of transverse colon: Secondary | ICD-10-CM

## 2024-05-23 DIAGNOSIS — Z800 Family history of malignant neoplasm of digestive organs: Secondary | ICD-10-CM

## 2024-05-23 MED ORDER — SODIUM CHLORIDE 0.9 % IV SOLN: 500.0000 mL | Freq: Once | INTRAVENOUS | Status: DC

## 2024-05-23 NOTE — Patient Instructions (Addendum)
 Continue present medications. Await pathology results. Repeat colonoscopy in 3 years for surveillance based on pathology results.  Please read over handout  YOU HAD AN ENDOSCOPIC PROCEDURE TODAY AT THE [facility]:   Refer to the procedure report that was given to you for any specific questions about what was found during the examination.  If the procedure report does not answer your questions, please call your gastroenterologist to clarify.  If you requested that your care partner not be given the details of your procedure findings, then the procedure report has been included in a sealed envelope for you to review at your convenience later.  YOU SHOULD EXPECT: Some feelings of bloating in the abdomen. Passage of more gas than usual.  Walking can help get rid of the air that was put into your GI tract during the procedure and reduce the bloating. If you had a lower endoscopy (such as a colonoscopy or flexible sigmoidoscopy) you may notice spotting of blood in your stool or on the toilet paper. If you underwent a bowel prep for your procedure, you may not have a normal bowel movement for a few days.  Please Note:  You might notice some irritation and congestion in your nose or some drainage.  This is from the oxygen used during your procedure.  There is no need for concern and it should clear up in a day or so.  SYMPTOMS TO REPORT IMMEDIATELY:  Following lower endoscopy (colonoscopy or flexible sigmoidoscopy):  Excessive amounts of blood in the stool  Significant tenderness or worsening of abdominal pains  Swelling of the abdomen that is new, acute  Fever of 100F or higher  For urgent or emergent issues, a gastroenterologist can be reached at any hour by calling [phone]. Do not use MyChart messaging for urgent concerns.    DIET:  We do recommend a small meal at first, but then you may proceed to your regular diet.  Drink plenty of fluids but you should avoid alcoholic  beverages for 24 hours.  ACTIVITY:  You should plan to take it easy for the rest of today and you should NOT DRIVE or use heavy machinery until tomorrow (because of the sedation medicines used during the test).    FOLLOW UP: Our staff will call the number listed on your records the next business day following your procedure.  We will call around 7:15- 8:00 am to check on you and address any questions or concerns that you may have regarding the information given to you following your procedure. If we do not reach you, we will leave a message.     If any biopsies were taken you will be contacted by phone or by letter within the next 1-3 weeks.  Please call us  at [phone] if you have not heard about the biopsies in 3 weeks.    SIGNATURES/CONFIDENTIALITY: You and/or your care partner have signed paperwork which will be entered into your electronic medical record.  These signatures attest to the fact that that the information above on your After Visit Summary has been reviewed and is understood.  Full responsibility of the confidentiality of this discharge information lies with you and/or your care-partner.

## 2024-05-23 NOTE — Progress Notes (Signed)
 Pt's states no medical or surgical changes since previsit or office visit.

## 2024-05-23 NOTE — Progress Notes (Signed)
 [facility] History and Physical   Primary Care Physician:  Cloria Annabella CROME, DO   Reason for Procedure:  History of adenomatous colon polyps, family history of colon cancer  Plan:    Surveillance colonoscopy with possible interventions as needed     HPI: Samuel Shaw is a very pleasant 63 y.o. male here for surveillance colonoscopy. Denies any nausea, vomiting, abdominal pain, melena or bright red blood per rectum  The risks and benefits as well as alternatives of endoscopic procedure(s) have been discussed and reviewed.  The patient was provided an opportunity to ask questions and all were answered. The patient agreed with the plan and demonstrated an understanding of the instructions.   Past Medical History:  Diagnosis Date   Allergy    Arthritis    Clotting disorder    blockage in artery lt. side neck   Hyperlipidemia    Hypertension    Neuromuscular disorder (HCC)    stroke lt. side affected   Stroke (HCC) 2015   Substance abuse (HCC)     History reviewed. No pertinent surgical history.  Prior to Admission medications   Medication Sig Start Date End Date Taking? Authorizing Provider  aspirin  EC 81 MG tablet Take 81 mg by mouth daily.   Yes [provider]  atorvastatin  (LIPITOR) 20 MG tablet Take 1 tablet (20 mg total) by mouth daily at 6 PM. 07/29/14  Yes Angiulli, Toribio PARAS, PA-C  donepezil  (ARICEPT ) 23 MG TABS tablet Take 1 tablet (23 mg total) by mouth at bedtime. 08/20/19  Yes McCue, Harlene, NP  fexofenadine (ALLEGRA) 30 MG tablet Take 60 mg by mouth every morning.    Yes [provider]  fluticasone  (FLONASE ) 50 MCG/ACT nasal spray Place 1 spray into both nostrils daily. 03/22/20  Yes Darr, Jacob, PA-C  gabapentin  (NEURONTIN ) 300 MG capsule take 1 capsule BY MOUTH THREE TIMES DAILY 01/11/16  Yes Kirsteins, Prentice BRAVO, MD  traZODone  (DESYREL ) 50 MG tablet Take 50 mg by mouth at bedtime. 03/04/15  Yes [provider]  Vitamin D,  Cholecalciferol, 10 MCG (400 UNIT) CAPS Take 1 capsule by mouth every 28 (twenty-eight) days.   Yes [provider]  acetaminophen  (TYLENOL ) 325 MG tablet Take 2 tablets (650 mg total) by mouth every 6 (six) hours as needed for mild pain or headache. 07/08/14   Elgergawy, Brayton RAMAN, MD    Current Outpatient Medications  Medication Sig Dispense Refill   aspirin  EC 81 MG tablet Take 81 mg by mouth daily.     atorvastatin  (LIPITOR) 20 MG tablet Take 1 tablet (20 mg total) by mouth daily at 6 PM. 30 tablet 1   donepezil  (ARICEPT ) 23 MG TABS tablet Take 1 tablet (23 mg total) by mouth at bedtime. 90 tablet 3   fexofenadine (ALLEGRA) 30 MG tablet Take 60 mg by mouth every morning.      fluticasone  (FLONASE ) 50 MCG/ACT nasal spray Place 1 spray into both nostrils daily. 15.8 mL 2   gabapentin  (NEURONTIN ) 300 MG capsule take 1 capsule BY MOUTH THREE TIMES DAILY 90 capsule 0   traZODone  (DESYREL ) 50 MG tablet Take 50 mg by mouth at bedtime.  1   Vitamin D, Cholecalciferol, 10 MCG (400 UNIT) CAPS Take 1 capsule by mouth every 28 (twenty-eight) days.     acetaminophen  (TYLENOL ) 325 MG tablet Take 2 tablets (650 mg total) by mouth every 6 (six) hours as needed for mild pain or headache.     Current Facility-Administered Medications  Medication Dose Route Frequency Provider Last Rate Last Admin   0.9 %  sodium chloride  infusion  500 mL Intravenous Once Elmo Shumard V, MD        Allergies as of 05/23/2024   (No Known Allergies)    Family History  Problem Relation Age of Onset   Diabetes Mother    Colon polyps Father    Colon cancer Father 53 - 82   Esophageal cancer Neg Hx    Rectal cancer Neg Hx    Stomach cancer Neg Hx     Social History   Socioeconomic History   Marital status: Married    Spouse name: Diplomatic Services Operational Officer   Number of children: 3   Years of education: 2y coll   Highest education level: Not on file  Occupational History   Occupation: Disabled     Comment: not working at  this time  Tobacco Use   Smoking status: Former    Current packs/day: 0.00    Average packs/day: 1 pack/day for 15.0 years (15.0 ttl pk-yrs)    Types: Cigarettes    Start date: 06/27/1999    Quit date: 06/26/2014    Years since quitting: 9.9   Smokeless tobacco: Never  Vaping Use   Vaping status: Not on file  Substance and Sexual Activity   Alcohol use: No    Alcohol/week: 0.0 standard drinks of alcohol   Drug use: Yes    Frequency: 3.0 times per week    Types: Marijuana   Sexual activity: Not on file  Other Topics Concern   Not on file  Social History Narrative   Caffeine .1-2 drinks a day .   Social Drivers of Corporate Investment Banker Strain: Not on file  Food Insecurity: Not on file  Transportation Needs: Not on file  Physical Activity: Not on file  Stress: Not on file  Social Connections: Not on file  Intimate Partner Violence: Not on file    Review of Systems:  All other review of systems negative except as mentioned in the HPI.  Physical Exam: Vital signs in last 24 hours: BP 120/86   Pulse (!) 48   Temp (!) 97.4 F (36.3 C) (Temporal)   Ht 6' 2 (1.88 m)   Wt 164 lb (74.4 kg)   SpO2 99%   BMI 21.06 kg/m  General:   Alert, NAD Lungs:  Clear .   Heart:  Regular rate and rhythm Abdomen:  Soft, nontender and nondistended. Neuro/Psych:  Alert and cooperative. Normal mood and affect. A and O x 3  Reviewed labs, radiology imaging, old records and pertinent past GI work up  Patient is appropriate for planned procedure(s) and anesthesia in an ambulatory setting   K. Veena Sarp Vernier , MD [phone]

## 2024-05-23 NOTE — Op Note (Signed)
 [facility] Patient Name: Samuel Shaw Procedure Date: 05/23/2024 10:45 AM MRN: [id number] Endoscopist: Gustav ALONSO Mcgee , MD, [id number] Age: 63 Referring MD:  Date of Birth: 1961-03-29 Gender: Male Account #: [id number] Procedure:                Colonoscopy Indications:              Screening in patient at increased risk: Colorectal                            cancer in father 4 or older, High risk colon                            cancer surveillance: Personal history of colonic                            polyps, High risk colon cancer surveillance:                            Personal history of adenoma less than 10 mm in size Medicines:                Monitored Anesthesia Care Procedure:                Pre-Anesthesia Assessment:                           - Prior to the procedure, a History and Physical                            was performed, and patient medications and                            allergies were reviewed. The patient's tolerance of                            previous anesthesia was also reviewed. The risks                            and benefits of the procedure and the sedation                            options and risks were discussed with the patient.                            All questions were answered, and informed consent                            was obtained. Prior Anticoagulants: The patient has                            taken no anticoagulant or antiplatelet agents. ASA                            Grade Assessment: III - A patient with severe  systemic disease. After reviewing the risks and                            benefits, the patient was deemed in satisfactory                            condition to undergo the procedure.                           After obtaining informed consent, the colonoscope                            was passed under direct vision. Throughout the                             procedure, the patient's blood pressure, pulse, and                            oxygen saturations were monitored continuously. The                            Olympus Scope SN [id number] was introduced through the                            anus and advanced to the the cecum, identified by                            appendiceal orifice and ileocecal valve. The                            colonoscopy was performed without difficulty. The                            patient tolerated the procedure well. The quality                            of the bowel preparation was good. The ileocecal                            valve, appendiceal orifice, and rectum were                            photographed. Scope In: 11:08:25 AM Scope Out: 11:25:38 AM Scope Withdrawal Time: 0 hours 12 minutes 59 seconds  Total Procedure Duration: 0 hours 17 minutes 13 seconds  Findings:                 The perianal and digital rectal examinations were                            normal.                           Three sessile polyps were found in the sigmoid  colon, transverse colon and ascending colon. The                            polyps were 5 to 10 mm in size. These polyps were                            removed with a cold snare. Resection and retrieval                            were complete.                           Non-bleeding external and internal hemorrhoids were                            found during retroflexion. The hemorrhoids were                            medium-sized. Complications:            No immediate complications. Estimated Blood Loss:     Estimated blood loss was minimal. Impression:               - Three 5 to 10 mm polyps in the sigmoid colon, in                            the transverse colon and in the ascending colon,                            removed with a cold snare. Resected and retrieved.                           - Non-bleeding external and internal  hemorrhoids. Recommendation:           - Resume previous diet.                           - Continue present medications.                           - Await pathology results.                           - Repeat colonoscopy in 3 years for surveillance                            based on pathology results. Blessin Kanno V. Kasson Lamere, MD 05/23/2024 11:35:54 AM This report has been signed electronically.

## 2024-05-23 NOTE — Progress Notes (Signed)
 Patient's HR brought to my and Dr. Trenna attention prior to bringing patient to procedural room.  We both feel it is appropriate to proceed.  Patient was just evaluated by cards for this issue.  It seems there was some improvement with patient off Trazadone & Aricept , but he has resumed those medications.  Secondarily, the patient admits to smoking of marijuana but states not in the last few days.  I do note the usual smell associated with marijuana when speaking with patient.

## 2024-05-23 NOTE — Progress Notes (Signed)
 Called to room to assist during endoscopic procedure.  Patient ID and intended procedure confirmed with present staff. Received instructions for my participation in the procedure from the performing physician.

## 2024-05-23 NOTE — Progress Notes (Signed)
 Transferred to PACU via stretcher.  Not responding to stimulation at this time.  VSS upon leaving procedure room.

## 2024-05-26 ENCOUNTER — Telehealth: Payer: Self-pay | Admitting: *Deleted

## 2024-05-26 NOTE — Telephone Encounter (Signed)
  Follow up Call-     05/23/2024   10:03 AM  Call back number  Post procedure Call Back phone  # [phone]  Permission to leave phone message Yes   Voicemail not set up- no answer left

## 2024-05-27 LAB — SURGICAL PATHOLOGY

## 2024-06-03 ENCOUNTER — Ambulatory Visit: Payer: Self-pay | Admitting: Gastroenterology
